# Patient Record
Sex: Male | Born: 1940 | Race: White | Hispanic: No | Marital: Married | Smoking: Former smoker
Health system: Southern US, Community
[De-identification: ages and names within clinical notes are randomized; demographics above are authoritative.]

## PROBLEM LIST (undated history)

## (undated) DIAGNOSIS — I10 Essential (primary) hypertension: Secondary | ICD-10-CM

## (undated) DIAGNOSIS — I4892 Unspecified atrial flutter: Secondary | ICD-10-CM

## (undated) DIAGNOSIS — Z9289 Personal history of other medical treatment: Secondary | ICD-10-CM

## (undated) DIAGNOSIS — I2699 Other pulmonary embolism without acute cor pulmonale: Secondary | ICD-10-CM

## (undated) DIAGNOSIS — R9430 Abnormal result of cardiovascular function study, unspecified: Secondary | ICD-10-CM

## (undated) DIAGNOSIS — M109 Gout, unspecified: Secondary | ICD-10-CM

## (undated) DIAGNOSIS — K259 Gastric ulcer, unspecified as acute or chronic, without hemorrhage or perforation: Secondary | ICD-10-CM

## (undated) DIAGNOSIS — I82409 Acute embolism and thrombosis of unspecified deep veins of unspecified lower extremity: Secondary | ICD-10-CM

## (undated) DIAGNOSIS — E785 Hyperlipidemia, unspecified: Secondary | ICD-10-CM

## (undated) DIAGNOSIS — R002 Palpitations: Secondary | ICD-10-CM

## (undated) DIAGNOSIS — D649 Anemia, unspecified: Secondary | ICD-10-CM

## (undated) DIAGNOSIS — M1990 Unspecified osteoarthritis, unspecified site: Secondary | ICD-10-CM

## (undated) DIAGNOSIS — Z87448 Personal history of other diseases of urinary system: Secondary | ICD-10-CM

## (undated) DIAGNOSIS — J45909 Unspecified asthma, uncomplicated: Secondary | ICD-10-CM

## (undated) DIAGNOSIS — Z8739 Personal history of other diseases of the musculoskeletal system and connective tissue: Secondary | ICD-10-CM

## (undated) DIAGNOSIS — I509 Heart failure, unspecified: Secondary | ICD-10-CM

## (undated) DIAGNOSIS — I5032 Chronic diastolic (congestive) heart failure: Secondary | ICD-10-CM

## (undated) DIAGNOSIS — N289 Disorder of kidney and ureter, unspecified: Secondary | ICD-10-CM

## (undated) DIAGNOSIS — I5033 Acute on chronic diastolic (congestive) heart failure: Secondary | ICD-10-CM

## (undated) HISTORY — DX: Hyperlipidemia, unspecified: E78.5

## (undated) HISTORY — DX: Chronic diastolic (congestive) heart failure: I50.32

## (undated) HISTORY — DX: Unspecified asthma, uncomplicated: J45.909

## (undated) HISTORY — DX: Palpitations: R00.2

## (undated) HISTORY — DX: Acute embolism and thrombosis of unspecified deep veins of unspecified lower extremity: I82.409

## (undated) HISTORY — DX: Essential (primary) hypertension: I10

---

## 2001-09-28 DIAGNOSIS — I82409 Acute embolism and thrombosis of unspecified deep veins of unspecified lower extremity: Secondary | ICD-10-CM

## 2001-09-28 DIAGNOSIS — I2699 Other pulmonary embolism without acute cor pulmonale: Secondary | ICD-10-CM

## 2001-09-28 HISTORY — PX: VENA CAVA FILTER PLACEMENT: SUR1032

## 2001-09-28 HISTORY — DX: Other pulmonary embolism without acute cor pulmonale: I26.99

## 2001-09-28 HISTORY — DX: Acute embolism and thrombosis of unspecified deep veins of unspecified lower extremity: I82.409

## 2001-10-19 ENCOUNTER — Ambulatory Visit (HOSPITAL_COMMUNITY): Admission: RE | Admit: 2001-10-19 | Discharge: 2001-10-20 | Payer: Self-pay | Admitting: *Deleted

## 2001-10-19 ENCOUNTER — Encounter: Payer: Self-pay | Admitting: *Deleted

## 2003-07-12 ENCOUNTER — Encounter (INDEPENDENT_AMBULATORY_CARE_PROVIDER_SITE_OTHER): Payer: Self-pay | Admitting: Specialist

## 2003-07-12 ENCOUNTER — Ambulatory Visit (HOSPITAL_COMMUNITY): Admission: RE | Admit: 2003-07-12 | Discharge: 2003-07-12 | Payer: Self-pay | Admitting: Gastroenterology

## 2003-07-13 ENCOUNTER — Ambulatory Visit (HOSPITAL_COMMUNITY): Admission: RE | Admit: 2003-07-13 | Discharge: 2003-07-13 | Payer: Self-pay | Admitting: Family Medicine

## 2007-01-27 ENCOUNTER — Ambulatory Visit (HOSPITAL_COMMUNITY): Admission: RE | Admit: 2007-01-27 | Discharge: 2007-01-27 | Payer: Self-pay | Admitting: Family Medicine

## 2007-01-27 ENCOUNTER — Ambulatory Visit: Payer: Self-pay | Admitting: Vascular Surgery

## 2007-01-27 ENCOUNTER — Encounter: Payer: Self-pay | Admitting: Vascular Surgery

## 2008-04-23 ENCOUNTER — Encounter: Admission: RE | Admit: 2008-04-23 | Discharge: 2008-04-23 | Payer: Self-pay | Admitting: Family Medicine

## 2008-05-07 ENCOUNTER — Encounter: Admission: RE | Admit: 2008-05-07 | Discharge: 2008-05-07 | Payer: Self-pay | Admitting: Family Medicine

## 2010-06-06 ENCOUNTER — Ambulatory Visit: Payer: Self-pay | Admitting: Cardiology

## 2010-06-07 ENCOUNTER — Encounter: Payer: Self-pay | Admitting: Cardiology

## 2010-08-07 ENCOUNTER — Encounter: Admission: RE | Admit: 2010-08-07 | Discharge: 2010-08-07 | Payer: Self-pay | Admitting: Family Medicine

## 2010-09-04 ENCOUNTER — Inpatient Hospital Stay (HOSPITAL_COMMUNITY): Admission: EM | Admit: 2010-09-04 | Discharge: 2010-06-08 | Payer: Self-pay | Admitting: Emergency Medicine

## 2010-10-09 ENCOUNTER — Ambulatory Visit
Admission: RE | Admit: 2010-10-09 | Discharge: 2010-10-09 | Payer: Self-pay | Source: Home / Self Care | Attending: Internal Medicine | Admitting: Internal Medicine

## 2010-10-09 DIAGNOSIS — Z86718 Personal history of other venous thrombosis and embolism: Secondary | ICD-10-CM | POA: Insufficient documentation

## 2010-10-09 DIAGNOSIS — I4891 Unspecified atrial fibrillation: Secondary | ICD-10-CM | POA: Insufficient documentation

## 2010-10-09 DIAGNOSIS — J189 Pneumonia, unspecified organism: Secondary | ICD-10-CM | POA: Insufficient documentation

## 2010-10-09 DIAGNOSIS — J45909 Unspecified asthma, uncomplicated: Secondary | ICD-10-CM | POA: Insufficient documentation

## 2010-10-09 DIAGNOSIS — R002 Palpitations: Secondary | ICD-10-CM | POA: Insufficient documentation

## 2010-10-09 DIAGNOSIS — E785 Hyperlipidemia, unspecified: Secondary | ICD-10-CM | POA: Insufficient documentation

## 2010-10-09 DIAGNOSIS — I10 Essential (primary) hypertension: Secondary | ICD-10-CM | POA: Insufficient documentation

## 2010-10-20 ENCOUNTER — Encounter: Payer: Self-pay | Admitting: Family Medicine

## 2010-10-30 NOTE — Assessment & Plan Note (Signed)
Summary: np6/dx:a-fib/lg   Visit Type:   Follow-up   History of Present Illness: Bryan Hubbard is referred today by Dr. Eldridge Dace for evaluation and treatment of atrial fib.and flutter. He was diagnosed with atrial fib in September 2011.  At that time he presented with CHF and had  preserved LV function by echo.  He has done well since then.  He notes palpitations occaisionally but has not had any CHF symptoms. His blood pressure tends to run on the low side. He has not had syncope. He exercises regularly. He has had ECG's demonstrating both atrial fib and flutter.  Current Medications (verified): 1)  Simvastatin 20 Mg Tabs (Simvastatin) .... Take One Tablet By Mouth Daily At Bedtime 2)  Furosemide 40 Mg Tabs (Furosemide) .... Take One Tablet By Mouth Daily. 3)  Zetia 10 Mg Tabs (Ezetimibe) .... Take One Tablet By Mouth Daily. 4)  Potassium Chloride Crys Cr 20 Meq Cr-Tabs (Potassium Chloride Crys Cr) .... Take One Tablet By Mouth Daily 5)  Quinapril Hcl 40 Mg Tabs (Quinapril Hcl) .... Once Daily 6)  Diclofenac Sodium 75 Mg Tbec (Diclofenac Sodium) .... As Needed 7)  Warfarin Sodium 2 Mg Tabs (Warfarin Sodium) .... Use As Directed By Anticoagualtion Clinic 8)  Singulair 10 Mg Tabs (Montelukast Sodium) .... At Bedtime 9)  Avodart 0.5 Mg Caps (Dutasteride) .... Once Daily 10)  Fluocinolone Acetonide 0.025 % Crea (Fluocinolone Acetonide) .... Uad As Needed 11)  Lumigan 0.03 % Soln (Bimatoprost) .... Uad 12)  (Albuterol Sulfate) .... Uad 13)  Niacin Cr 500 Mg Cr-Caps (Niacin) .... 2 Tabs Daily 14)  Vitamin D 700 .... Once Daily 15)  Zinc 50 Mg Tabs (Zinc) .... Once Daily 16)  Tylenol 8 Hour 650 Mg Cr-Tabs (Acetaminophen) .... Uad 17)  Calcium 600 600 Mg Tabs (Calcium Carbonate) .... Once Daily 18)  Vitamin C 500 Mg  Tabs (Ascorbic Acid) .... Once Daily 19)  Multivitamins   Tabs (Multiple Vitamin) .... Once Daily 20)  Metoprolol Tartrate 25 Mg Tabs (Metoprolol Tartrate) .... Take One Tablet By  Mouth Twice A Day 21)  Amlodipine Besylate 5 Mg Tabs (Amlodipine Besylate) .... Take 1tablet By Mouth Daily 22)  Tamsulosin Hcl 0.4 Mg Caps (Tamsulosin Hcl) .... Qhs  Allergies (verified): No Known Drug Allergies  Past History:  Past Medical History: Last updated: 10/09/2010 Current Problems:  HYPERLIPIDEMIA (ICD-272.4) DVT (ICD-453.40) ALLERGIC ASTHMA (ICD-493.00) PALPITATIONS (ICD-785.1) HYPERTENSION, BENIGN (ICD-401.1) ATRIAL FIBRILLATION (ICD-427.31)    Family History: Last updated: 10/09/2010 No premature CAD No h/o atrial fib.  Social History: Last updated: 10/09/2010 Retired from [employer]) Married  Tobacco Use - Former.  Alcohol Use - yes Regular Exercise - yes  Family History: No premature CAD No h/o atrial fib.  Social History: Retired from [employer]) Married  Tobacco Use - Former.  Alcohol Use - yes Regular Exercise - yes  Review of Systems       All systems reviewed and negative except as noted in the HPI.  Vital Signs:  Patient profile:   70 year old male Height:      70 inches Weight:      196 pounds BMI:     28.22 Pulse rate:   64 / minute BP sitting:   110 / 68  (left arm)  Vitals Entered By: Laurance Flatten CMA (October 09, 2010 11:34 AM)  Physical Exam  General:  Well developed, well nourished, in no acute distress.  HEENT: normal Neck: supple. No JVD. Carotids 2+ bilaterally no bruits Cor:  IRIRR no rubs, gallops or murmur Lungs: CTA Ab: soft, nontender. nondistended. No HSM. Good bowel sounds Ext: warm. no cyanosis, clubbing or edema Neuro: alert and oriented. Grossly nonfocal. affect pleasant    EKG  Procedure date:  09/05/2010  Findings:      Atrial flutter with a ventricular response rate of:  80  Impression & Recommendations:  Problem # 1:  ATRIAL FIBRILLATION (ICD-427.31) The patient has both atrial fib and flutter. I have discussed the treatment options. First I think he will need lifelong coumadin  (or Pradaxa). With regard to rate vs rhythm control, we discussed the pro's and con's of both forms of treatment. As he has done well with rate control, I would recommend that this strategy be continued. He is only on low dose metoprolol. He might do better on long acting metoprolol. He may also eventually need to be considered for digoxin. I would not recommend either flutter or fib or both be ablated as his symptoms are minimal.  He will need to have his ventricular rate watched and I would recommend periodic holter monitoring to guide rate control. His updated medication list for this problem includes:    Warfarin Sodium 2 Mg Tabs (Warfarin sodium) ..... Use as directed by anticoagualtion clinic    Metoprolol Tartrate 25 Mg Tabs (Metoprolol tartrate) .Marland Kitchen... Take one tablet by mouth twice a day  Problem # 2:  HYPERTENSION, BENIGN (ICD-401.1) His blood pressure has been running low and I have asked him to stop his amlodipine. The following medications were removed from the medication list:    Amlodipine Besylate 5 Mg Tabs (Amlodipine besylate) .Marland Kitchen... Take 1tablet by mouth daily His updated medication list for this problem includes:    Furosemide 40 Mg Tabs (Furosemide) .Marland Kitchen... Take one tablet by mouth daily.    Quinapril Hcl 40 Mg Tabs (Quinapril hcl) ..... Once daily    Metoprolol Tartrate 25 Mg Tabs (Metoprolol tartrate) .Marland Kitchen... Take one tablet by mouth twice a day  Problem # 3:  CHRONIC DIASTOLIC HEART FAILURE (ICD-428.32) His symptoms are currently class 1. He will continue his current meds. A low sodium diet and rate control of his atrial fib are recommended. The following medications were removed from the medication list:    Amlodipine Besylate 5 Mg Tabs (Amlodipine besylate) .Marland Kitchen... Take 1tablet by mouth daily His updated medication list for this problem includes:    Furosemide 40 Mg Tabs (Furosemide) .Marland Kitchen... Take one tablet by mouth daily.    Quinapril Hcl 40 Mg Tabs (Quinapril hcl) ..... Once  daily    Warfarin Sodium 2 Mg Tabs (Warfarin sodium) ..... Use as directed by anticoagualtion clinic    Metoprolol Tartrate 25 Mg Tabs (Metoprolol tartrate) .Marland Kitchen... Take one tablet by mouth twice a day  Patient Instructions: 1)  Your physician recommends that you schedule a follow-up appointment as needed . 2)  Your physician has recommended you make the following change in your medication: Stop Amlodipine.

## 2010-12-11 LAB — CBC
HCT: 34.2 % — ABNORMAL LOW (ref 39.0–52.0)
HCT: 36.5 % — ABNORMAL LOW (ref 39.0–52.0)
Hemoglobin: 11.3 g/dL — ABNORMAL LOW (ref 13.0–17.0)
Hemoglobin: 11.7 g/dL — ABNORMAL LOW (ref 13.0–17.0)
MCH: 31.1 pg (ref 26.0–34.0)
MCH: 31.7 pg (ref 26.0–34.0)
MCHC: 33.7 g/dL (ref 30.0–36.0)
MCHC: 34.2 g/dL (ref 30.0–36.0)
MCHC: 35.1 g/dL (ref 30.0–36.0)
MCV: 92.4 fL (ref 78.0–100.0)
Platelets: 297 10*3/uL (ref 150–400)
RBC: 3.79 MIL/uL — ABNORMAL LOW (ref 4.22–5.81)
RDW: 12.4 % (ref 11.5–15.5)
RDW: 12.7 % (ref 11.5–15.5)
WBC: 7.1 10*3/uL (ref 4.0–10.5)

## 2010-12-11 LAB — COMPREHENSIVE METABOLIC PANEL
ALT: 33 U/L (ref 0–53)
CO2: 27 mEq/L (ref 19–32)
Calcium: 8.7 mg/dL (ref 8.4–10.5)
Creatinine, Ser: 0.96 mg/dL (ref 0.4–1.5)
GFR calc Af Amer: >60 mL/min (ref >60)
GFR calc non Af Amer: >60 mL/min (ref >60)
Glucose, Bld: 119 mg/dL — ABNORMAL HIGH (ref 70–99)
Sodium: 132 mEq/L — ABNORMAL LOW (ref 135–145)
Total Protein: 6.9 g/dL (ref 6.0–8.3)

## 2010-12-11 LAB — CULTURE, BLOOD (ROUTINE X 2): Culture: NO GROWTH

## 2010-12-11 LAB — BASIC METABOLIC PANEL
BUN: 15 mg/dL (ref 6–23)
Calcium: 9.5 mg/dL (ref 8.4–10.5)
Chloride: 99 mEq/L (ref 96–112)
Creatinine, Ser: 0.85 mg/dL (ref 0.4–1.5)
GFR calc Af Amer: >60 mL/min (ref >60)
GFR calc Af Amer: >60 mL/min (ref >60)
GFR calc non Af Amer: >60 mL/min (ref >60)
GFR calc non Af Amer: >60 mL/min (ref >60)
Glucose, Bld: 110 mg/dL — ABNORMAL HIGH (ref 70–99)
Potassium: 3.6 mEq/L (ref 3.5–5.1)
Sodium: 131 mEq/L — ABNORMAL LOW (ref 135–145)

## 2010-12-11 LAB — TSH: TSH: 1.284 u[IU]/mL (ref 0.350–4.500)

## 2010-12-11 LAB — CARDIAC PANEL(CRET KIN+CKTOT+MB+TROPI)
CK, MB: 2 ng/mL (ref 0.3–4.0)
CK, MB: 2.3 ng/mL (ref 0.3–4.0)
Relative Index: 1.6 (ref 0.0–2.5)
Relative Index: 1.9 (ref 0.0–2.5)
Total CK: 113 U/L (ref 7–232)
Total CK: 125 U/L (ref 7–232)
Troponin I: 0.15 ng/mL — ABNORMAL HIGH (ref 0.00–0.06)
Troponin I: 0.28 ng/mL — ABNORMAL HIGH (ref 0.00–0.06)
Troponin I: 0.47 ng/mL — ABNORMAL HIGH (ref 0.00–0.06)

## 2010-12-11 LAB — RETICULOCYTES: Retic Count, Absolute: 28.5 10*3/uL (ref 19.0–186.0)

## 2010-12-11 LAB — POCT CARDIAC MARKERS
CKMB, poc: 2.8 ng/mL (ref 1.0–8.0)
Myoglobin, poc: 129 ng/mL (ref 12–200)

## 2010-12-11 LAB — PROTIME-INR
INR: 1.69 — ABNORMAL HIGH (ref 0.00–1.49)
INR: 1.73 — ABNORMAL HIGH (ref 0.00–1.49)
Prothrombin Time: 20.1 seconds — ABNORMAL HIGH (ref 11.6–15.2)

## 2010-12-11 LAB — LIPID PANEL
Cholesterol: 115 mg/dL (ref 0–200)
LDL Cholesterol: 56 mg/dL (ref 0–99)

## 2010-12-11 LAB — IRON AND TIBC
Iron: 20 ug/dL — ABNORMAL LOW (ref 42–135)
Saturation Ratios: 8 % — ABNORMAL LOW (ref 20–55)
TIBC: 251 ug/dL (ref 215–435)
UIBC: 231 ug/dL

## 2010-12-11 LAB — BRAIN NATRIURETIC PEPTIDE: Pro B Natriuretic peptide (BNP): 723 pg/mL — ABNORMAL HIGH (ref 0.0–100.0)

## 2010-12-11 LAB — D-DIMER, QUANTITATIVE: D-Dimer, Quant: 1.03 ug/mL-FEU — ABNORMAL HIGH (ref 0.00–0.48)

## 2010-12-11 LAB — VITAMIN B12: Vitamin B-12: 176 pg/mL — ABNORMAL LOW (ref 211–911)

## 2010-12-11 LAB — MAGNESIUM: Magnesium: 2.3 mg/dL (ref 1.5–2.5)

## 2011-01-30 ENCOUNTER — Ambulatory Visit
Admission: RE | Admit: 2011-01-30 | Discharge: 2011-01-30 | Disposition: A | Discharge status: Home / Self Care | Payer: Medicare Other | Source: Ambulatory Visit | Attending: Family Medicine | Admitting: Family Medicine

## 2011-01-30 ENCOUNTER — Other Ambulatory Visit: Payer: Self-pay | Admitting: Family Medicine

## 2011-01-30 DIAGNOSIS — I4891 Unspecified atrial fibrillation: Secondary | ICD-10-CM

## 2011-02-13 NOTE — H&P (Signed)
[place]  Patient:    Bryan Hubbard, Bryan Hubbard Visit Number: [id number] MRN: [id number]          Service Type: DSU Location: 2000 2018 01 Attending Physician:  Melvenia Needles Dictated by:   Adair Patter, P.A. Admit Date:  10/19/2001 Discharge Date: 10/20/2001   CC:         CVTS office   History and Physical  CHIEF COMPLAINT:  Right femoral vein thrombus.  HISTORY OF PRESENT ILLNESS:  This is a 70 year old man who is referred to CVTS office secondary to free thrombus in his right femoral vein.  Because of this, Dr. Edilia Bo had the patient transferred emergently to [facility] in order to have an inferior vena cava filter placed.  The patient denied any nausea, vomiting, shortness of breath, or chest pain at the time of admission.  PAST MEDICAL HISTORY: 1. Hypertension. 2. Glaucoma.  PAST SURGICAL HISTORY:  The patient states he has periodontal surgery, but no other major surgery.  ALLERGIES:  No known medication allergies.  ADMITTING MEDICATIONS:  1. Pulmicort inhaler two puffs two times daily.  2. Albuterol inhaler two puffs three times daily.  3. Singulair 15 mg p.o. q.d.  4. Lipitor 10 mg p.o. q.d.  5. Aspirin 325 mg p.o. q.d.  6. Accupril 20 mg p.o. q.d.  7. Timolol 0.25% eyedrops, one drop each eye q.d.  8. Multivitamin one p.o. q.d.  9. Zinc 50 mg p.o. q.d. 10. Serevent inhaler two puffs b.i.d.  SOCIAL HISTORY:  The patient states he drinks two beers a day.  He denies any smoking.  FAMILY HISTORY:  Noncontributory.  REVIEW OF SYSTEMS:  GENERAL:  The patient denies any fever, chills, sweats, or frequent illnesses.  HEAD:  The patient denies any head injury or seizures. EYES:  The patient wears corrective lenses, has glaucoma.  EARS:  The patient denies any hearing loss, tinnitus, or vertigo, or ear infections.  NOSE:  The patient denies any epistaxis or sinusitis.  MOUTH:  The patient denies any problems with  dentition or frequent sore throats.  NECK:  The patient denies any lumps or masses in his neck or pain with range of motion.  CARDIOVASCULAR: The patient has a history of hypertension and gets occasional palpitations. He denies any MI, cardiac arrhythmias, or coronary artery disease.  PULMONARY: The patient has asthma, but denies any bronchitis or emphysema. GASTROINTESTINAL:  The patient denies any nausea, vomiting, diarrhea, constipation, gastroesophageal reflux disease, hematochezia, or melena. GENITOURINARY:  The patient denies any impotence, urgency, incontinence, or urinary tract infections.  MUSCULOSKELETAL:  The patient denies any arthritis, arthralgias, or myalgias.  NEUROLOGIC:  The patient denies any history of memory loss or depression, TIA, or CV.  PHYSICAL EXAMINATION:  VITAL SIGNS:  Blood pressure 147/87, pulse 58 and regular, respirations 16, temperature 98.9, weight 209 pounds.  GENERAL:  The patient is alert and oriented x3 in no distress.  HEENT:  Head is atraumatic, normocephalic.  Eyes:  Pupils are equal, round, and reactive to light and accommodation.  Extraocular muscles are intact without scleral icterus or nystagmus.  Nose:  Nares patent, intact, sinuses are nontender.  Mouth:  Moist without exudates.  NECK:  Supple without JVD, carotid bruits, lymphadenopathy, or thyromegaly.  HEART:  Reveals regular rate and rhythm without murmurs, gallops, or rubs.  LUNGS:  Bilaterally clear to auscultation.  ABDOMEN:  Soft, nontender, nondistended.  Positive bowel sounds in all four quadrants.  EXTREMITIES:  Reveal no cyanosis, clubbing, or  edema.  NEUROLOGIC:  Cranial nerves II-XII are grossly intact.  No focal neurologic deficits were noted.  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  PLAN: 1. The patient will be admitted for [place] filter placement. 2. The patient will be acclimated to Coumadin and Lovenox. Dictated by:   Adair Patter,  P.A. Attending Physician:  Melvenia Needles DD:  10/19/01 TD:  10/19/01 Job: [id number]

## 2011-02-13 NOTE — Op Note (Signed)
   NAMEMAURICIO, Bryan Hubbard                          ACCOUNT NO.:  [id number]   MEDICAL RECORD NO.:  [id number]                   PATIENT TYPE:  AMB   LOCATION:  ENDO                                 FACILITY:  [facility]   PHYSICIAN:  Graylin Shiver, M.D.                DATE OF BIRTH:  09-Jan-1941   DATE OF PROCEDURE:  07/12/2003  DATE OF DISCHARGE:                                 OPERATIVE REPORT   PROCEDURE:  Colonoscopy with polypectomy and biopsy.   INDICATIONS FOR PROCEDURE:  Screening.   Informed consent was obtained after explanation of the risks of bleeding,  infection and perforation.   PREMEDICATION:  Fentanyl 75 mcg IV, Versed 8 mg IV.   DESCRIPTION OF PROCEDURE:  After informed consent was obtained and conscious  sedation achieved, a rectal exam was performed and no masses were felt. The  Olympus colonoscope was inserted into the rectum and advanced around the  colon to the cecum. Cecal landmarks were identified. The cecum and ascending  colon were normal. The transverse colon was normal. The proximal descending  colon revealed a small 3 mm polyp which was removed with cold forceps. In  the mid sigmoid, there was a 6 mm sessile polyp which was snared and removed  by snare cautery technique. The cautery site looked good, the polyp was  retrieved. The rectum looked normal. He tolerated the procedure well without  complications.   IMPRESSION:  Two colon polyps.   PLAN:  The pathology will be checked.                                               Graylin Shiver, M.D.    SFG/MEDQ  D:  07/12/2003  T:  07/12/2003  Job:  [id number]   cc:   Dellis Anes. Idell Pickles, M.D.  [address]  [place]  [place] [address]  Fax: [phone]

## 2011-02-13 NOTE — Procedures (Signed)
[place]  Patient:    Bryan Hubbard, Bryan Hubbard Visit Number: [id number] MRN: [id number]          Service Type: DSU Location: 2000 2018 01 Attending Physician:  Melvenia Needles Dictated by:   Denman George, M.D. Proc. Date: 10/19/01 Admit Date:  10/19/2001   CC:         Dellis Anes. Idell Pickles, M.D.  Peripheral Vascular Catheterization Lab   Procedure Report  DIAGNOSIS: Free floating thrombus, right common femoral vein.  PROCEDURES: 1. Inferior venacavagram. 2. Placement of inferior vena cava trapeze, inferior vena cava filter.  ACCESS: Left common femoral vein 6 French sheath.  CONTRAST: Visipaque 70 mL.  COMPLICATIONS: None apparent.  CLINICAL NOTE: This is a 70 year old male who presented to Dr. Toya Smothers office with a history of right leg swelling for the past 2-3 weeks. He was referred to CVTS for a Doppler study, which revealed evidence of a free-floating thrombus in the right common femoral vein. He was seen and evaluated by Dr. Edilia Bo, and referred to the hospital for placement of an inferior vena cava filter.  DESCRIPTION OF PROCEDURE: He was brought to the Peripheral Vascular Catheterization Laboratory in stable condition and placed in the supine position. Informed consent obtained.  Both groins were prepped and draped in a sterile fashion. He was administered 2 mg of Nubain, 2 mg of Versed intravenously.  Left groin skin and subcutaneous tissue instilled with 1% Xylocaine.  A needle was easily introduced to the left common femoral vein. A  0.35 J wire passed through the needle, and under fluoroscopy passed into the inferior vena cava. A long 6 French sheath and dilator were advanced over the guide wire. The guide wire was removed and the sheath connected to the power injector.  An inferior venacavagram was obtained with two injections of 30 cc of contrast. These outlined the inferior vena cava, and position of the left and  right renal vein. The left renal vein was noted to arise at the level of the L2, L3 junction. The right renal vein was proximal to this.  The dilator was then removed from the sheath. The trapeze filter was then advanced into the sheath and advanced to the L2, L3 position. It was deployed and fluoroscopy revealed excellent position. The sheath was then removed.  There were no apparent complications. The patient was transferred to the holding are in stable condition.  FINAL IMPRESSION: 1. Free-floating thrombus, right common femoral vein. 2. Successful deployment of inferior vena cava trapeze inferior vena cava    filter. Dictated by:   Denman George, M.D. Attending Physician:  Melvenia Needles DD:  10/19/01 TD:  10/20/01 Job: [id number]

## 2011-03-10 ENCOUNTER — Other Ambulatory Visit: Payer: Self-pay | Admitting: Internal Medicine

## 2011-03-10 ENCOUNTER — Encounter (HOSPITAL_BASED_OUTPATIENT_CLINIC_OR_DEPARTMENT_OTHER): Payer: Medicare Other | Admitting: Internal Medicine

## 2011-03-10 DIAGNOSIS — E538 Deficiency of other specified B group vitamins: Secondary | ICD-10-CM

## 2011-03-10 DIAGNOSIS — Z7901 Long term (current) use of anticoagulants: Secondary | ICD-10-CM

## 2011-03-10 DIAGNOSIS — D509 Iron deficiency anemia, unspecified: Secondary | ICD-10-CM

## 2011-03-10 DIAGNOSIS — D649 Anemia, unspecified: Secondary | ICD-10-CM

## 2011-03-10 LAB — CBC & DIFF AND RETIC
BASO%: 0.3 % (ref 0.0–2.0)
HCT: 34.8 % — ABNORMAL LOW (ref 38.4–49.9)
Immature Retic Fract: 11.3 % (ref 0.00–13.40)
LYMPH%: 16.9 % (ref 14.0–49.0)
MCHC: 31.6 g/dL — ABNORMAL LOW (ref 32.0–36.0)
MCV: 86.8 fL (ref 79.3–98.0)
MONO#: 0.9 10*3/uL (ref 0.1–0.9)
MONO%: 8.8 % (ref 0.0–14.0)
NEUT%: 70.5 % (ref 39.0–75.0)
Platelets: 369 10*3/uL (ref 140–400)
RBC: 4.01 10*6/uL — ABNORMAL LOW (ref 4.20–5.82)
nRBC: 0 % (ref 0–0)

## 2011-03-12 LAB — IRON AND TIBC
%SAT: 10 % — ABNORMAL LOW (ref 20–55)
Iron: 29 ug/dL — ABNORMAL LOW (ref 42–165)
UIBC: 259 ug/dL

## 2011-03-12 LAB — COMPREHENSIVE METABOLIC PANEL
ALT: 29 U/L (ref 0–53)
Alkaline Phosphatase: 100 U/L (ref 39–117)
CO2: 23 mEq/L (ref 19–32)
Creatinine, Ser: 0.73 mg/dL (ref 0.50–1.35)
Sodium: 138 mEq/L (ref 135–145)
Total Bilirubin: 0.5 mg/dL (ref 0.3–1.2)

## 2011-03-12 LAB — PROTEIN ELECTROPHORESIS, SERUM
Alpha-2-Globulin: 15.4 % — ABNORMAL HIGH (ref 7.1–11.8)
Beta Globulin: 7 % (ref 4.7–7.2)
Gamma Globulin: 15.5 % (ref 11.1–18.8)

## 2011-03-12 LAB — FERRITIN: Ferritin: 326 ng/mL — ABNORMAL HIGH (ref 22–322)

## 2011-03-12 LAB — LACTATE DEHYDROGENASE: LDH: 145 U/L (ref 94–250)

## 2011-03-30 ENCOUNTER — Other Ambulatory Visit: Payer: Self-pay | Admitting: Internal Medicine

## 2011-03-30 ENCOUNTER — Encounter (HOSPITAL_BASED_OUTPATIENT_CLINIC_OR_DEPARTMENT_OTHER): Payer: Medicare Other | Admitting: Internal Medicine

## 2011-03-30 DIAGNOSIS — D509 Iron deficiency anemia, unspecified: Secondary | ICD-10-CM

## 2011-03-30 DIAGNOSIS — D539 Nutritional anemia, unspecified: Secondary | ICD-10-CM

## 2011-03-30 LAB — CBC WITH DIFFERENTIAL/PLATELET
Eosinophils Absolute: 0.5 10*3/uL (ref 0.0–0.5)
HCT: 34.7 % — ABNORMAL LOW (ref 38.4–49.9)
LYMPH%: 15.4 % (ref 14.0–49.0)
MCHC: 32.8 g/dL (ref 32.0–36.0)
MCV: 86.2 fL (ref 79.3–98.0)
MONO#: 0.9 10*3/uL (ref 0.1–0.9)
MONO%: 8.7 % (ref 0.0–14.0)
NEUT#: 7.8 10*3/uL — ABNORMAL HIGH (ref 1.5–6.5)
NEUT%: 70.8 % (ref 39.0–75.0)
Platelets: 397 10*3/uL (ref 140–400)
RBC: 4.03 10*6/uL — ABNORMAL LOW (ref 4.20–5.82)

## 2011-06-25 ENCOUNTER — Encounter (HOSPITAL_BASED_OUTPATIENT_CLINIC_OR_DEPARTMENT_OTHER): Payer: Medicare Other | Admitting: Internal Medicine

## 2011-06-25 ENCOUNTER — Other Ambulatory Visit: Payer: Self-pay | Admitting: Internal Medicine

## 2011-06-25 DIAGNOSIS — E538 Deficiency of other specified B group vitamins: Secondary | ICD-10-CM

## 2011-06-25 DIAGNOSIS — Z7901 Long term (current) use of anticoagulants: Secondary | ICD-10-CM

## 2011-06-25 DIAGNOSIS — D509 Iron deficiency anemia, unspecified: Secondary | ICD-10-CM

## 2011-06-25 LAB — IRON AND TIBC
%SAT: 14 % — ABNORMAL LOW (ref 20–55)
Iron: 41 ug/dL — ABNORMAL LOW (ref 42–165)

## 2011-06-25 LAB — CBC WITH DIFFERENTIAL/PLATELET
BASO%: 0.6 % (ref 0.0–2.0)
EOS%: 5 % (ref 0.0–7.0)
HCT: 36 % — ABNORMAL LOW (ref 38.4–49.9)
LYMPH%: 22.2 % (ref 14.0–49.0)
MCH: 28.5 pg (ref 27.2–33.4)
MCHC: 33.4 g/dL (ref 32.0–36.0)
MCV: 85.1 fL (ref 79.3–98.0)
MONO#: 0.8 10*3/uL (ref 0.1–0.9)
MONO%: 8.6 % (ref 0.0–14.0)
NEUT%: 63.6 % (ref 39.0–75.0)
Platelets: 324 10*3/uL (ref 140–400)
RBC: 4.23 10*6/uL (ref 4.20–5.82)
WBC: 8.8 10*3/uL (ref 4.0–10.3)

## 2011-07-01 ENCOUNTER — Encounter (HOSPITAL_BASED_OUTPATIENT_CLINIC_OR_DEPARTMENT_OTHER): Payer: Medicare Other | Admitting: Internal Medicine

## 2011-07-01 DIAGNOSIS — D509 Iron deficiency anemia, unspecified: Secondary | ICD-10-CM

## 2011-11-24 ENCOUNTER — Other Ambulatory Visit: Payer: Self-pay | Admitting: Internal Medicine

## 2011-11-26 ENCOUNTER — Telehealth: Payer: Self-pay | Admitting: Internal Medicine

## 2011-11-26 NOTE — Telephone Encounter (Signed)
Per 2/26 pof pt can't keep 4/4 f/u appt please r/s. Also per pof 4/2 lb ok. lmonvm for pt re new f/u appt for 4/8 but then spoke w/pt on cell + per pt he will be out of town 4/8. Pt given new f/u appt for 4/15 @ 9:45 am and 4/2 lb appt also confirmed.

## 2011-12-29 ENCOUNTER — Other Ambulatory Visit (HOSPITAL_BASED_OUTPATIENT_CLINIC_OR_DEPARTMENT_OTHER): Payer: Medicare Other | Admitting: Lab

## 2011-12-29 DIAGNOSIS — D509 Iron deficiency anemia, unspecified: Secondary | ICD-10-CM

## 2011-12-29 LAB — CBC WITH DIFFERENTIAL/PLATELET
Basophils Absolute: 0 10*3/uL (ref 0.0–0.1)
EOS%: 3.3 % (ref 0.0–7.0)
Eosinophils Absolute: 0.3 10*3/uL (ref 0.0–0.5)
HCT: 38.5 % (ref 38.4–49.9)
HGB: 12.9 g/dL — ABNORMAL LOW (ref 13.0–17.1)
MCH: 31.8 pg (ref 27.2–33.4)
MCV: 94.9 fL (ref 79.3–98.0)
MONO%: 13.6 % (ref 0.0–14.0)
NEUT#: 7.1 10*3/uL — ABNORMAL HIGH (ref 1.5–6.5)
NEUT%: 68.3 % (ref 39.0–75.0)
Platelets: 272 10*3/uL (ref 140–400)
RDW: 14.2 % (ref 11.0–14.6)

## 2011-12-29 LAB — FERRITIN: Ferritin: 222 ng/mL (ref 22–322)

## 2011-12-31 ENCOUNTER — Ambulatory Visit: Payer: Medicare Other | Admitting: Internal Medicine

## 2012-01-11 ENCOUNTER — Ambulatory Visit (HOSPITAL_BASED_OUTPATIENT_CLINIC_OR_DEPARTMENT_OTHER): Payer: Medicare Other | Admitting: Internal Medicine

## 2012-01-11 VITALS — BP 130/84 | HR 96 | Temp 97.1°F | Ht 70.0 in | Wt 190.1 lb

## 2012-01-11 DIAGNOSIS — D509 Iron deficiency anemia, unspecified: Secondary | ICD-10-CM

## 2012-01-11 MED ORDER — INTEGRA PLUS PO CAPS: 1.0000 | ORAL_CAPSULE | Freq: Every morning | ORAL | Status: DC

## 2012-01-11 NOTE — Progress Notes (Signed)
[facility] Telephone:([phone]) [phone]   [phone]  OFFICE PROGRESS NOTE  Darrow Bussing, MD, MD [address] [place] [address]  DIAGNOSIS: Iron deficiency anemia.  CURRENT THERAPY: Integra plus 1 capsule by mouth daily.  INTERVAL HISTORY: Bryan Hubbard 71 y.o. male returns to the clinic today for six-month followup visit accompanied by his wife. The patient is doing fine with no specific complaints. He denied having any significant fatigue or dizzy spells. He has no chest pain or shortness of breath, no cough or hemoptysis. No significant weight loss or night sweats. He has repeat CBC and iron study performed recently and he is here today for evaluation and discussion of his lab results  ALLERGIES:   has no allergies on file.  MEDICATIONS:  Current Outpatient Prescriptions  Medication Sig Dispense Refill  . FeFum-FePoly-FA-B Cmp-C-Biot (INTEGRA PLUS) CAPS Take 1 capsule by mouth every morning.  90 capsule  1    REVIEW OF SYSTEMS:  A comprehensive review of systems was negative.   PHYSICAL EXAMINATION: General appearance: alert, cooperative and no distress Lymph nodes: Cervical, supraclavicular, and axillary nodes normal. Resp: clear to auscultation bilaterally Cardio: regular rate and rhythm, S1, S2 normal, no murmur, click, rub or gallop GI: soft, non-tender; bowel sounds normal; no masses,  no organomegaly Extremities: extremities normal, atraumatic, no cyanosis or edema  ECOG PERFORMANCE STATUS: 0 - Asymptomatic  Blood pressure 130/84, pulse 96, temperature 97.1 F (36.2 C), temperature source Oral, height 5\' 10"  (1.778 m), weight 190 lb 1.6 oz (86.229 kg).  LABORATORY DATA: Lab Results  Component Value Date   WBC 10.4* 12/29/2011   HGB 12.9* 12/29/2011   HCT 38.5 12/29/2011   MCV 94.9 12/29/2011   PLT 272 12/29/2011      Chemistry      Component Value Date/Time   NA 138 03/10/2011 0938   NA 138 03/10/2011 0938   NA 138 03/10/2011  0938   NA 138 03/10/2011 0938   NA 138 03/10/2011 0938   K 4.3 03/10/2011 0938   K 4.3 03/10/2011 0938   K 4.3 03/10/2011 0938   K 4.3 03/10/2011 0938   K 4.3 03/10/2011 0938   CL 105 03/10/2011 0938   CL 105 03/10/2011 0938   CL 105 03/10/2011 0938   CL 105 03/10/2011 0938   CL 105 03/10/2011 0938   CO2 23 03/10/2011 0938   CO2 23 03/10/2011 0938   CO2 23 03/10/2011 0938   CO2 23 03/10/2011 0938   CO2 23 03/10/2011 0938   BUN 18 03/10/2011 0938   BUN 18 03/10/2011 0938   BUN 18 03/10/2011 0938   BUN 18 03/10/2011 0938   BUN 18 03/10/2011 0938   CREATININE 0.73 03/10/2011 0938   CREATININE 0.73 03/10/2011 0938   CREATININE 0.73 03/10/2011 0938   CREATININE 0.73 03/10/2011 0938   CREATININE 0.73 03/10/2011 0938      Component Value Date/Time   CALCIUM 9.4 03/10/2011 0938   CALCIUM 9.4 03/10/2011 0938   CALCIUM 9.4 03/10/2011 0938   CALCIUM 9.4 03/10/2011 0938   CALCIUM 9.4 03/10/2011 0938   ALKPHOS 100 03/10/2011 0938   ALKPHOS 100 03/10/2011 0938   ALKPHOS 100 03/10/2011 0938   ALKPHOS 100 03/10/2011 0938   ALKPHOS 100 03/10/2011 0938   AST 25 03/10/2011 0938   AST 25 03/10/2011 0938   AST 25 03/10/2011 0938   AST 25 03/10/2011 0938   AST 25 03/10/2011 0938   ALT 29  03/10/2011 0938   ALT 29 03/10/2011 0938   ALT 29 03/10/2011 0938   ALT 29 03/10/2011 0938   ALT 29 03/10/2011 0938   BILITOT 0.5 03/10/2011 0938   BILITOT 0.5 03/10/2011 0938   BILITOT 0.5 03/10/2011 0938   BILITOT 0.5 03/10/2011 0938   BILITOT 0.5 03/10/2011 0938       RADIOGRAPHIC STUDIES: No results found.  ASSESSMENT: This is a very pleasant 71 years old white male with history of deficiency anemia currently on Integra plus 1 capsule by mouth daily. He is doing fine and tolerating his treatment fairly well. She also has improvement in his hemoglobin and hematocrit.  PLAN: I recommend for the patient to continue on the same treatment for now was followup visit with his primary care physician. I gave him a refill of Integra plus for  six-months, but he can get refill from his primary care physician in the future. I did not get addition followup appointment but I would be happy to see him in the future as needed.  All questions were answered. The patient knows to call the clinic with any problems, questions or concerns. We can certainly see the patient much sooner if necessary.

## 2012-08-29 ENCOUNTER — Other Ambulatory Visit: Payer: Self-pay | Admitting: Family Medicine

## 2012-08-29 DIAGNOSIS — Z136 Encounter for screening for cardiovascular disorders: Secondary | ICD-10-CM

## 2012-08-31 ENCOUNTER — Other Ambulatory Visit: Payer: Self-pay | Admitting: Internal Medicine

## 2012-09-06 ENCOUNTER — Ambulatory Visit: Payer: 59

## 2012-09-07 ENCOUNTER — Ambulatory Visit
Admission: RE | Admit: 2012-09-07 | Discharge: 2012-09-07 | Disposition: A | Discharge status: Home / Self Care | Payer: Medicare Other | Source: Ambulatory Visit | Attending: Family Medicine | Admitting: Family Medicine

## 2012-09-07 DIAGNOSIS — Z136 Encounter for screening for cardiovascular disorders: Secondary | ICD-10-CM

## 2012-11-08 ENCOUNTER — Encounter: Payer: Self-pay | Admitting: Internal Medicine

## 2013-08-30 ENCOUNTER — Encounter: Payer: Self-pay | Admitting: Interventional Cardiology

## 2013-08-30 ENCOUNTER — Encounter: Payer: Self-pay | Admitting: *Deleted

## 2013-08-31 ENCOUNTER — Encounter: Payer: Self-pay | Admitting: Interventional Cardiology

## 2013-08-31 ENCOUNTER — Ambulatory Visit (INDEPENDENT_AMBULATORY_CARE_PROVIDER_SITE_OTHER): Payer: Medicare Other | Admitting: Interventional Cardiology

## 2013-08-31 VITALS — BP 110/70 | HR 86 | Ht 70.0 in | Wt 195.8 lb

## 2013-08-31 DIAGNOSIS — R002 Palpitations: Secondary | ICD-10-CM

## 2013-08-31 DIAGNOSIS — I82409 Acute embolism and thrombosis of unspecified deep veins of unspecified lower extremity: Secondary | ICD-10-CM

## 2013-08-31 DIAGNOSIS — I4892 Unspecified atrial flutter: Secondary | ICD-10-CM | POA: Insufficient documentation

## 2013-08-31 DIAGNOSIS — I5032 Chronic diastolic (congestive) heart failure: Secondary | ICD-10-CM

## 2013-08-31 DIAGNOSIS — I10 Essential (primary) hypertension: Secondary | ICD-10-CM

## 2013-08-31 DIAGNOSIS — E785 Hyperlipidemia, unspecified: Secondary | ICD-10-CM

## 2013-08-31 NOTE — Progress Notes (Signed)
Patient ID: Bryan Hubbard, male   DOB: 1941-01-24, 72 y.o.   MRN: [id number]    [address] [place], [place]  [address] Phone: [phone] Fax:  [phone]  Date:  08/31/2013   ID:  Bryan Hubbard, DOB December 12, 1940, MRN [id number]  PCP:  Darrow Bussing, MD      History of Present Illness: Bryan Hubbard is a 72 y.o. male who has had AFib and DVT. HE had shortlived palpitations but these have resolved. Overall Improved. No syncope or palpitations. Atrial Fibrillation F/U:  Yard work, weights, floor exercise; daily walking. c/o Dizziness while getting up from sitting position.  Denies : Chest pain.  Leg edema.  Orthopnea.  Palpitations.  Shortness of breath.  Syncope.     Wt Readings from Last 3 Encounters:  08/31/13 195 lb 12.8 oz (88.814 kg)  01/11/12 190 lb 1.6 oz (86.229 kg)  10/09/10 196 lb (88.905 kg)     Past Medical History  Diagnosis Date  . Atrial fibrillation   . Chronic diastolic heart failure   . DVT   . HYPERLIPIDEMIA   . HYPERTENSION, BENIGN   . ALLERGIC ASTHMA   . Palpitations     Current Outpatient Prescriptions  Medication Sig Dispense Refill  . acetaminophen (TYLENOL) 650 MG CR tablet Take 650 mg by mouth every 8 (eight) hours as needed.      Marland Kitchen albuterol (PROVENTIL HFA;VENTOLIN HFA) 108 (90 BASE) MCG/ACT inhaler Inhale 2 puffs into the lungs every 6 (six) hours as needed.      . bimatoprost (LUMIGAN) 0.03 % ophthalmic solution 1 drop at bedtime. Taking .10 %      . cyanocobalamin (,VITAMIN B-12,) 1000 MCG/ML injection Inject 1,000 mcg into the muscle once.      . diclofenac (VOLTAREN) 75 MG EC tablet Take 75 mg by mouth daily.       Marland Kitchen dutasteride (AVODART) 0.5 MG capsule Take 0.5 mg by mouth daily.      Marland Kitchen FeFum-FePoly-FA-B Cmp-C-Biot (INTEGRA PLUS) CAPS Take 1 capsule by mouth every morning.  90 capsule  1  . furosemide (LASIX) 20 MG tablet Take 20 mg by mouth daily.      . metoprolol tartrate (LOPRESSOR) 25 MG tablet Take 25 mg  by mouth 2 (two) times daily.      . montelukast (SINGULAIR) 10 MG tablet Take 10 mg by mouth at bedtime.       . Multiple Vitamin (MULTIVITAMIN) tablet Take 1 tablet by mouth daily.      . niacin (SLO-NIACIN) 500 MG tablet Take 500 mg by mouth 2 (two) times daily with a meal.      . omeprazole (PRILOSEC) 20 MG capsule Take 20 mg by mouth daily.       . potassium chloride (K-DUR) 10 MEQ tablet Take 10 mEq by mouth 2 (two) times daily.      . quinapril (ACCUPRIL) 20 MG tablet Take 20 mg by mouth at bedtime.      . traMADol (ULTRAM) 50 MG tablet Take 50 mg by mouth every 6 (six) hours as needed.       . vitamin C (ASCORBIC ACID) 500 MG tablet Take 500 mg by mouth daily.      Marland Kitchen VYTORIN 10-20 MG per tablet Take 1 tablet by mouth at bedtime.       Marland Kitchen warfarin (COUMADIN) 4 MG tablet Take 4 mg by mouth daily. 4 mg , m-f; 4.5 mg sat/sun  No current facility-administered medications for this visit.    Allergies:   Not on File  Social History:  The patient     Family History:  The patient's family history includes Heart attack in his maternal grandmother; Stroke in his mother.   ROS:  Please see the history of present illness.  No nausea, vomiting.  No fevers, chills.  No focal weakness.  No dysuria.    All other systems reviewed and negative.   PHYSICAL EXAM: VS:  BP 110/70  Pulse 86  Ht 5\' 10"  (1.778 m)  Wt 195 lb 12.8 oz (88.814 kg)  BMI 28.09 kg/m2 Well nourished, well developed, in no acute distress HEENT: normal Neck: no JVD, no carotid bruits Cardiac:  normal S1, S2; irregularly irregular Lungs:  clear to auscultation bilaterally, no wheezing, rhonchi or rales Abd: soft, nontender, no hepatomegaly Ext: no edema Skin: warm and dry Neuro:   no focal abnormalities noted  EKG:  Prior ECG 6 months ago showed rate-controlled atrial flutter    ASSESSMENT AND PLAN:  Atrial fibrillation  Continue Metoprolol Tartrate Tablet, 25 MG, 1 tablet, Orally, Twice a day  IMAGING: EKG     [name],[name] 02/27/2013 11:47:52 AM > VARANASI,[name] 02/27/2013 12:23:17 PM > Atrial flutter, rate controlled   Notes: Flutter. Rate controlled. Stroke prevention with Coumadin. Has seen Dr. Ladona Ridgel in the past regarding ablation. Not needed at this time. we discussed changing anticoagulation due to the inconvenience of Coumadin. We will check with his insurance company as to whether Eliquis or Xarelto would be affordable.    2. Chronic diastolic heart failure  Decrease Furosemide Tablet, 20MG , 1 tablet, Orally, two-four days a week Notes: Decrease frequency of furosemide as tolerated. For the most part, he is taking this twice a week. he is not having any significantshortness of breath.    3. Essential hypertension, benign  Continue Quinapril HCl Tablet, 40 MG, 1/2 tablet, Orally, once daily Notes: Controlled at home.    4. Combined hyperlipidemia  Continue Vytorin Tablet, 10-20 MG, 1 tablet, Orally, Once a day  Notes: Lipids were controlled in May but he had cramps with Crestor. Back on Vytorin. Vit D level normal. CoEnzyme Q10 200 mg daily has helped.  Negative nuclear stress test in 2011.      Labs    Lab: CBC without Diff (Ordered for 03/10/2013)  WBC 5.4  4.0-11.0 - K/ul  RBC 4.14 L 4.20-5.80 - M/uL  HCT 39.4  39.0-52.0 - %  HGB 13.2  13.0-17.0 - g/dL  MCV 16.1 H 09.6-04.5 - fL  MCH 31.9  27.0-33.0 - pg  MCHC 33.4  32.0-36.0 - g/dL  RDW 40.9  81.1-91.4 - %  PLT 222  150-400 - K/uL   VARANASI,[name] 03/10/2013 05:31:32 PM > hbg stable [name],[name] 03/13/2013 09:04:05 AM > Pt notified.        Lab: Statin Panel (Ordered for 03/10/2013)  ALP 65  38-126 - U/L  ALT 29  0-52 - U/L  AST 31  0-39 - U/L  CHOLESTEROL 167  <200 - mg/dL  TRIG 782  9-562 - mg/dL  DLDL 130 H 8-65 - mg/dL  NON-HDL 784  6-962 - mg/dL  DIRECT HDL 47  95-28 - mg/dL  CHOL/HDL 3.6  4.1-3.2 - Ratio   VARANASI,[name] 03/10/2013 05:31:59 PM > lipids, liver stable [name],[name] 03/13/2013 09:04:35 AM > Pt notified.      Signed, Fredric Mare, MD, [facility] 08/31/2013 10:03 AM

## 2013-08-31 NOTE — Patient Instructions (Signed)
Your physician wants you to follow-up in: 6 months with Dr. Eldridge Dace. You will receive a reminder letter in the mail two months in advance. If you don't receive a letter, please call our office to schedule the follow-up appointment.  Your physician recommends that you return for lab work in 6 months FASTING on the same day as your OV.  Your physician recommends that you continue on your current medications as directed. Please refer to the Current Medication list given to you today.

## 2013-12-14 ENCOUNTER — Other Ambulatory Visit: Payer: Self-pay | Admitting: Family Medicine

## 2013-12-14 ENCOUNTER — Ambulatory Visit
Admission: RE | Admit: 2013-12-14 | Discharge: 2013-12-14 | Disposition: A | Discharge status: Home / Self Care | Payer: Medicare Other | Source: Ambulatory Visit | Attending: Family Medicine | Admitting: Family Medicine

## 2013-12-14 DIAGNOSIS — R0602 Shortness of breath: Secondary | ICD-10-CM

## 2013-12-18 ENCOUNTER — Inpatient Hospital Stay (HOSPITAL_COMMUNITY)
Admission: EM | Admit: 2013-12-18 | Discharge: 2013-12-30 | DRG: 545 | Disposition: A | Discharge status: Home / Self Care | Payer: Medicare Other | Attending: Internal Medicine | Admitting: Internal Medicine

## 2013-12-18 ENCOUNTER — Encounter (HOSPITAL_COMMUNITY): Payer: Self-pay | Admitting: Emergency Medicine

## 2013-12-18 ENCOUNTER — Inpatient Hospital Stay (HOSPITAL_COMMUNITY): Payer: Medicare Other

## 2013-12-18 DIAGNOSIS — Z8249 Family history of ischemic heart disease and other diseases of the circulatory system: Secondary | ICD-10-CM

## 2013-12-18 DIAGNOSIS — Z87891 Personal history of nicotine dependence: Secondary | ICD-10-CM

## 2013-12-18 DIAGNOSIS — D75839 Thrombocytosis, unspecified: Secondary | ICD-10-CM

## 2013-12-18 DIAGNOSIS — I776 Arteritis, unspecified: Secondary | ICD-10-CM

## 2013-12-18 DIAGNOSIS — I7782 Antineutrophilic cytoplasmic antibody (ANCA) vasculitis: Secondary | ICD-10-CM

## 2013-12-18 DIAGNOSIS — N400 Enlarged prostate without lower urinary tract symptoms: Secondary | ICD-10-CM | POA: Diagnosis present

## 2013-12-18 DIAGNOSIS — N059 Unspecified nephritic syndrome with unspecified morphologic changes: Secondary | ICD-10-CM

## 2013-12-18 DIAGNOSIS — E785 Hyperlipidemia, unspecified: Secondary | ICD-10-CM | POA: Diagnosis present

## 2013-12-18 DIAGNOSIS — I10 Essential (primary) hypertension: Secondary | ICD-10-CM | POA: Diagnosis present

## 2013-12-18 DIAGNOSIS — I4892 Unspecified atrial flutter: Secondary | ICD-10-CM | POA: Diagnosis present

## 2013-12-18 DIAGNOSIS — I509 Heart failure, unspecified: Secondary | ICD-10-CM | POA: Diagnosis present

## 2013-12-18 DIAGNOSIS — J45909 Unspecified asthma, uncomplicated: Secondary | ICD-10-CM | POA: Diagnosis present

## 2013-12-18 DIAGNOSIS — M310 Hypersensitivity angiitis: Principal | ICD-10-CM | POA: Diagnosis present

## 2013-12-18 DIAGNOSIS — Z823 Family history of stroke: Secondary | ICD-10-CM

## 2013-12-18 DIAGNOSIS — E871 Hypo-osmolality and hyponatremia: Secondary | ICD-10-CM | POA: Diagnosis not present

## 2013-12-18 DIAGNOSIS — Z86718 Personal history of other venous thrombosis and embolism: Secondary | ICD-10-CM | POA: Diagnosis present

## 2013-12-18 DIAGNOSIS — D638 Anemia in other chronic diseases classified elsewhere: Secondary | ICD-10-CM

## 2013-12-18 DIAGNOSIS — D72829 Elevated white blood cell count, unspecified: Secondary | ICD-10-CM | POA: Diagnosis present

## 2013-12-18 DIAGNOSIS — N058 Unspecified nephritic syndrome with other morphologic changes: Secondary | ICD-10-CM | POA: Diagnosis present

## 2013-12-18 DIAGNOSIS — J189 Pneumonia, unspecified organism: Secondary | ICD-10-CM | POA: Diagnosis present

## 2013-12-18 DIAGNOSIS — J9600 Acute respiratory failure, unspecified whether with hypoxia or hypercapnia: Secondary | ICD-10-CM | POA: Diagnosis present

## 2013-12-18 DIAGNOSIS — Z7901 Long term (current) use of anticoagulants: Secondary | ICD-10-CM

## 2013-12-18 DIAGNOSIS — R002 Palpitations: Secondary | ICD-10-CM

## 2013-12-18 DIAGNOSIS — I4891 Unspecified atrial fibrillation: Secondary | ICD-10-CM | POA: Diagnosis present

## 2013-12-18 DIAGNOSIS — I5032 Chronic diastolic (congestive) heart failure: Secondary | ICD-10-CM | POA: Diagnosis present

## 2013-12-18 DIAGNOSIS — N179 Acute kidney failure, unspecified: Secondary | ICD-10-CM | POA: Diagnosis present

## 2013-12-18 DIAGNOSIS — I82409 Acute embolism and thrombosis of unspecified deep veins of unspecified lower extremity: Secondary | ICD-10-CM

## 2013-12-18 DIAGNOSIS — D473 Essential (hemorrhagic) thrombocythemia: Secondary | ICD-10-CM

## 2013-12-18 LAB — CBC
HCT: 27.3 % — ABNORMAL LOW (ref 39.0–52.0)
HEMOGLOBIN: 9.3 g/dL — AB (ref 13.0–17.0)
MCH: 30.2 pg (ref 26.0–34.0)
MCHC: 34.1 g/dL (ref 30.0–36.0)
MCV: 88.6 fL (ref 78.0–100.0)
PLATELETS: 456 10*3/uL — AB (ref 150–400)
RBC: 3.08 MIL/uL — AB (ref 4.22–5.81)
RDW: 13 % (ref 11.5–15.5)
WBC: 15.5 10*3/uL — ABNORMAL HIGH (ref 4.0–10.5)

## 2013-12-18 LAB — COMPREHENSIVE METABOLIC PANEL
ALBUMIN: 3 g/dL — AB (ref 3.5–5.2)
ALK PHOS: 104 U/L (ref 39–117)
ALT: 70 U/L — AB (ref 0–53)
AST: 63 U/L — ABNORMAL HIGH (ref 0–37)
BUN: 31 mg/dL — ABNORMAL HIGH (ref 6–23)
CALCIUM: 9.8 mg/dL (ref 8.4–10.5)
CO2: 20 mEq/L (ref 19–32)
Chloride: 95 mEq/L — ABNORMAL LOW (ref 96–112)
Creatinine, Ser: 1.53 mg/dL — ABNORMAL HIGH (ref 0.50–1.35)
GFR calc non Af Amer: 43 mL/min — ABNORMAL LOW (ref >90)
GFR, EST AFRICAN AMERICAN: 50 mL/min — AB (ref >90)
Glucose, Bld: 117 mg/dL — ABNORMAL HIGH (ref 70–99)
Potassium: 4.2 mEq/L (ref 3.7–5.3)
SODIUM: 132 meq/L — AB (ref 137–147)
Total Bilirubin: 1.2 mg/dL (ref 0.3–1.2)
Total Protein: 7.8 g/dL (ref 6.0–8.3)

## 2013-12-18 LAB — PROTIME-INR
INR: 2.02 — ABNORMAL HIGH (ref 0.00–1.49)
PROTHROMBIN TIME: 22.2 s — AB (ref 11.6–15.2)

## 2013-12-18 LAB — I-STAT TROPONIN, ED: TROPONIN I, POC: 0.01 ng/mL (ref 0.00–0.08)

## 2013-12-18 LAB — PRO B NATRIURETIC PEPTIDE: PRO B NATRI PEPTIDE: 5296 pg/mL — AB (ref 0–125)

## 2013-12-18 MED ORDER — LEVOFLOXACIN 750 MG PO TABS: 750.0000 mg | ORAL_TABLET | Freq: Every day | ORAL | Status: DC

## 2013-12-18 MED ORDER — DEXTROSE 5 % IV SOLN
500.0000 mg | INTRAVENOUS | Status: DC
  Administered 2013-12-18 – 2013-12-20 (×3): 500 mg via INTRAVENOUS
  Filled 2013-12-18 (×4): qty 500

## 2013-12-18 MED ORDER — MAGNESIUM OXIDE 400 (241.3 MG) MG PO TABS
200.0000 mg | ORAL_TABLET | Freq: Every day | ORAL | Status: DC
  Administered 2013-12-18 – 2013-12-29 (×12): 200 mg via ORAL
  Filled 2013-12-18 (×14): qty 0.5

## 2013-12-18 MED ORDER — WARFARIN - PHARMACIST DOSING INPATIENT: Freq: Every day | Status: DC

## 2013-12-18 MED ORDER — TRAMADOL HCL 50 MG PO TABS
50.0000 mg | ORAL_TABLET | Freq: Two times a day (BID) | ORAL | Status: DC
  Administered 2013-12-19 – 2013-12-30 (×22): 50 mg via ORAL
  Filled 2013-12-18 (×22): qty 1

## 2013-12-18 MED ORDER — WARFARIN SODIUM 2 MG PO TABS
2.0000 mg | ORAL_TABLET | Freq: Once | ORAL | Status: AC
  Administered 2013-12-18: 2 mg via ORAL
  Filled 2013-12-18 (×2): qty 1

## 2013-12-18 MED ORDER — SODIUM CHLORIDE 0.9 % IV SOLN
INTRAVENOUS | Status: AC
  Administered 2013-12-18: 23:00:00 via INTRAVENOUS

## 2013-12-18 MED ORDER — ONDANSETRON HCL 4 MG/2ML IJ SOLN
4.0000 mg | Freq: Three times a day (TID) | INTRAMUSCULAR | Status: AC | PRN
  Administered 2013-12-19: 4 mg via INTRAVENOUS
  Filled 2013-12-18: qty 2

## 2013-12-18 MED ORDER — DICLOFENAC SODIUM 75 MG PO TBEC
75.0000 mg | DELAYED_RELEASE_TABLET | Freq: Every day | ORAL | Status: DC
  Administered 2013-12-18 – 2013-12-19 (×2): 75 mg via ORAL
  Filled 2013-12-18 (×3): qty 1

## 2013-12-18 MED ORDER — MAGNESIUM 250 MG PO TABS: 1.0000 | ORAL_TABLET | Freq: Every evening | ORAL | Status: DC

## 2013-12-18 MED ORDER — DUTASTERIDE 0.5 MG PO CAPS
0.5000 mg | ORAL_CAPSULE | Freq: Every day | ORAL | Status: DC
  Administered 2013-12-18 – 2013-12-30 (×12): 0.5 mg via ORAL
  Filled 2013-12-18 (×15): qty 1

## 2013-12-18 MED ORDER — SODIUM CHLORIDE 0.9 % IJ SOLN
3.0000 mL | Freq: Two times a day (BID) | INTRAMUSCULAR | Status: DC
  Administered 2013-12-18 – 2013-12-30 (×10): 3 mL via INTRAVENOUS

## 2013-12-18 MED ORDER — LATANOPROST 0.005 % OP SOLN
1.0000 [drp] | Freq: Every day | OPHTHALMIC | Status: DC
Start: 2013-12-19 — End: 2013-12-25
  Administered 2013-12-19 – 2013-12-24 (×2): 1 [drp] via OPHTHALMIC
  Filled 2013-12-18: qty 2.5

## 2013-12-18 MED ORDER — PANTOPRAZOLE SODIUM 40 MG PO TBEC
40.0000 mg | DELAYED_RELEASE_TABLET | Freq: Every day | ORAL | Status: DC
  Administered 2013-12-19 – 2013-12-30 (×12): 40 mg via ORAL
  Filled 2013-12-18 (×11): qty 1

## 2013-12-18 MED ORDER — EZETIMIBE-SIMVASTATIN 10-20 MG PO TABS
1.0000 | ORAL_TABLET | Freq: Every day | ORAL | Status: DC
  Administered 2013-12-21 – 2013-12-30 (×10): 1 via ORAL
  Filled 2013-12-18 (×12): qty 1

## 2013-12-18 MED ORDER — CO-ENZYME Q10 200 MG PO CAPS: 200.0000 mg | ORAL_CAPSULE | Freq: Every day | ORAL | Status: DC

## 2013-12-18 MED ORDER — METOPROLOL TARTRATE 25 MG PO TABS
25.0000 mg | ORAL_TABLET | Freq: Two times a day (BID) | ORAL | Status: DC
  Administered 2013-12-18 – 2013-12-22 (×8): 25 mg via ORAL
  Filled 2013-12-18 (×9): qty 1

## 2013-12-18 MED ORDER — DEXTROSE 5 % IV SOLN
1.0000 g | INTRAVENOUS | Status: AC
  Administered 2013-12-19 – 2013-12-24 (×6): 1 g via INTRAVENOUS
  Filled 2013-12-18 (×6): qty 10

## 2013-12-18 MED ORDER — TAMSULOSIN HCL 0.4 MG PO CAPS
0.4000 mg | ORAL_CAPSULE | Freq: Every day | ORAL | Status: DC
Start: 2013-12-19 — End: 2013-12-30
  Administered 2013-12-19 – 2013-12-30 (×12): 0.4 mg via ORAL
  Filled 2013-12-18 (×12): qty 1

## 2013-12-18 MED ORDER — DUTASTERIDE-TAMSULOSIN HCL 0.5-0.4 MG PO CAPS: 1.0000 | ORAL_CAPSULE | Freq: Every day | ORAL | Status: DC

## 2013-12-18 MED ORDER — DEXTROSE 5 % IV SOLN
1.0000 g | Freq: Once | INTRAVENOUS | Status: AC
  Administered 2013-12-18: 1 g via INTRAVENOUS
  Filled 2013-12-18: qty 10

## 2013-12-18 MED ORDER — HEPARIN SODIUM (PORCINE) 5000 UNIT/ML IJ SOLN
5000.0000 [IU] | Freq: Three times a day (TID) | INTRAMUSCULAR | Status: DC
  Administered 2013-12-18 – 2013-12-20 (×5): 5000 [IU] via SUBCUTANEOUS
  Filled 2013-12-18 (×8): qty 1

## 2013-12-18 MED ORDER — MONTELUKAST SODIUM 10 MG PO TABS
10.0000 mg | ORAL_TABLET | Freq: Every day | ORAL | Status: DC
  Administered 2013-12-18 – 2013-12-29 (×12): 10 mg via ORAL
  Filled 2013-12-18 (×14): qty 1

## 2013-12-18 NOTE — ED Notes (Signed)
Pt being sent from Dr Dorthy [place] office with xray and notes. Pt seen by pcp last week for SOB x2 weeks, xray showed bil infiltrates, started on levoquin, pt came back to pcp today still SOB and some CHF, wbc 14

## 2013-12-18 NOTE — Progress Notes (Signed)
ANTICOAGULATION CONSULT NOTE - Initial Consult  Pharmacy Consult for Warfarin  Indication: Hx A.Fib, DVT  No Known Allergies  Patient Measurements:    Vital Signs: Temp: 98.1 F (36.7 C) (03/23 1424) Temp src: Oral (03/23 1424) BP: 122/74 mmHg (03/23 1424) Pulse Rate: 78 (03/23 1424)  Labs:  Recent Labs  12/18/13 1635  HGB 9.3*  HCT 27.3*  PLT 456*  LABPROT 22.2*  INR 2.02*  CREATININE 1.53*    The CrCl is unknown because both a height and weight (above a minimum accepted value) are required for this calculation.   Medical History: Past Medical History  Diagnosis Date  . Atrial fibrillation   . Chronic diastolic heart failure   . DVT   . HYPERLIPIDEMIA   . HYPERTENSION, BENIGN   . ALLERGIC ASTHMA   . Palpitations     Medications:  Scheduled:   Infusions:  . cefTRIAXone (ROCEPHIN)  IV     PRN:   Assessment:  73 yo M with CAP, Patient started on levaquin outpatient on 3/19 and was sent to ER today by PCP for continued SOB x 2 weeks and bilateral infiltrates on CXR.  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  Ordered per pharmacy to dose.   Home warfarin dose = 2 mg daily; Last dose reported 3/22  INR on admission is therapeutic, 2.02 (Goal 2-3)   CBC okay, no bleeding issues noted  Goal of Therapy:  INR 2-3 Monitor platelets by anticoagulation protocol: Yes   Plan:  1.) Continue Home dosing tonight - Warfarin 2 mg po x 1 at 2000  2.) Daily PT/INR 3.) Monitor CBC and s/sx bleeding.    BorgerdingGaye Alken PharmD Pager #: [phone] 6:39 PM 12/18/2013

## 2013-12-18 NOTE — H&P (Signed)
[facility] History and Physical  [name]:[id number] DOB: 16-Oct-1940 DOA: 12/18/2013  Referring physician: EDP PCP: [name], MD   Chief Complaint: SOB   HPI: Bryan Hubbard is a 73 y.o. male who presents with a 2 week history of SOB.  X ray on 3/19 showed B infiltrates.  Patient started on levaquin.  Despite that he was still SOB and so went back to his PCP today.  PCP sent patient in to ED.  Earlier this morning patient also took 80mg  lasix, (normally takes 20mg  2 times a week), accidentally took 80 this morning when he was supposed to take 40 per his PCP.  He has noted no peripheral edema.  He does have a h/o diastolic CHF.  His SOB is worse with activity, better when lying down, slight improvement with the lasix.  Review of Systems: Some cough, no fevers, no chills, cough non-productive, Systems reviewed.  As above, otherwise negative  Past Medical History  Diagnosis Date  . Atrial fibrillation   . Chronic diastolic heart failure   . DVT   . HYPERLIPIDEMIA   . HYPERTENSION, BENIGN   . ALLERGIC ASTHMA   . Palpitations    History reviewed. No pertinent past surgical history. Social History:  reports that he has quit smoking. He does not have any smokeless tobacco history on file. His alcohol and drug histories are not on file.  No Known Allergies  Family History  Problem Relation Age of Onset  . Stroke Mother   . Heart attack Maternal Grandmother      Prior to Admission medications   Medication Sig Start Date End Date Taking? Authorizing Provider  bimatoprost (LUMIGAN) 0.01 % SOLN Place 1 drop into both eyes every morning.   Yes Historical Provider, MD  Calcium Carb-Cholecalciferol (CALCIUM + D3 PO) Take 1 tablet by mouth daily.   Yes Historical Provider, MD  Co-Enzyme Q10 200 MG CAPS Take 200 mg by mouth every evening.   Yes Historical Provider, MD  cyanocobalamin (,VITAMIN B-12,) 1000 MCG/ML injection Inject 1,000 mcg into the muscle every 30  (thirty) days.    Yes Historical Provider, MD  diclofenac (VOLTAREN) 75 MG EC tablet Take 75 mg by mouth daily.  07/04/13  Yes Historical Provider, MD  Dutasteride-Tamsulosin HCl 0.5-0.4 MG CAPS Take 1 capsule by mouth daily.   Yes Historical Provider, MD  furosemide (LASIX) 20 MG tablet Take 20 mg by mouth 2 (two) times a week. On Tuesday and Thursday.   Yes Historical Provider, MD  levofloxacin (LEVAQUIN) 750 MG tablet Take 750 mg by mouth daily. For 10 days. 12/14/13  Yes Historical Provider, MD  Magnesium 250 MG TABS Take 1 tablet by mouth every evening.   Yes Historical Provider, MD  metoprolol tartrate (LOPRESSOR) 25 MG tablet Take 25 mg by mouth 2 (two) times daily. 06/15/13  Yes Historical Provider, MD  montelukast (SINGULAIR) 10 MG tablet Take 10 mg by mouth at bedtime.  06/10/13  Yes Historical Provider, MD  Multiple Vitamin (MULTIVITAMIN) tablet Take 1 tablet by mouth daily.   Yes Historical Provider, MD  omeprazole (PRILOSEC) 20 MG capsule Take 20 mg by mouth daily.  08/17/13  Yes Historical Provider, MD  potassium chloride SA (K-DUR,KLOR-CON) 20 MEQ tablet Take 20 mEq by mouth 2 (two) times a week. On Tuesday and Thursday.   Yes Historical Provider, MD  quinapril (ACCUPRIL) 20 MG tablet Take 20 mg by mouth daily.    Yes Historical Provider, MD  traMADol (ULTRAM) 50 MG  tablet Take 50 mg by mouth 2 (two) times daily at 10 am and 4 pm.  07/14/13  Yes Historical Provider, MD  vitamin C (ASCORBIC ACID) 500 MG tablet Take 500 mg by mouth daily.   Yes Historical Provider, MD  VYTORIN 10-20 MG per tablet Take 1 tablet by mouth at bedtime.  08/27/13  Yes Historical Provider, MD  warfarin (COUMADIN) 2 MG tablet Take 2 mg by mouth daily.   Yes Historical Provider, MD   Physical Exam: Filed Vitals:   12/18/13 1424  BP: 122/74  Pulse: 78  Temp: 98.1 F (36.7 C)    BP 122/74  Pulse 78  Temp(Src) 98.1 F (36.7 C) (Oral)  SpO2 93%  General Appearance:    Alert, oriented, no distress, appears  stated age  Head:    Normocephalic, atraumatic  Eyes:    PERRL, EOMI, sclera non-icteric        Nose:   Nares without drainage or epistaxis. Mucosa, turbinates normal  Throat:   Moist mucous membranes. Oropharynx without erythema or exudate.  Neck:   Supple. No carotid bruits.  No thyromegaly.  No lymphadenopathy.   Back:     No CVA tenderness, no spinal tenderness  Lungs:     Clear to auscultation bilaterally, without wheezes, rhonchi or rales  Chest wall:    No tenderness to palpitation  Heart:    Regular rate and rhythm without murmurs, gallops, rubs  Abdomen:     Soft, non-tender, nondistended, normal bowel sounds, no organomegaly  Genitalia:    deferred  Rectal:    deferred  Extremities:   No clubbing, cyanosis or edema.  Pulses:   2+ and symmetric all extremities  Skin:   Skin color, texture, turgor normal, no rashes or lesions  Lymph nodes:   Cervical, supraclavicular, and axillary nodes normal  Neurologic:   CNII-XII intact. Normal strength, sensation and reflexes      throughout    Labs on Admission:  Basic Metabolic Panel:  Recent Labs Lab 12/18/13 1635  NA 132*  K 4.2  CL 95*  CO2 20  GLUCOSE 117*  BUN 31*  CREATININE 1.53*  CALCIUM 9.8   Liver Function Tests:  Recent Labs Lab 12/18/13 1635  AST 63*  ALT 70*  ALKPHOS 104  BILITOT 1.2  PROT 7.8  ALBUMIN 3.0*   No results found for this basename: LIPASE, AMYLASE,  in the last 168 hours No results found for this basename: AMMONIA,  in the last 168 hours CBC:  Recent Labs Lab 12/18/13 1635  WBC 15.5*  HGB 9.3*  HCT 27.3*  MCV 88.6  PLT 456*   Cardiac Enzymes: No results found for this basename: CKTOTAL, CKMB, CKMBINDEX, TROPONINI,  in the last 168 hours  BNP (last 3 results)  Recent Labs  12/18/13 1635  PROBNP 5296.0*   CBG: No results found for this basename: GLUCAP,  in the last 168 hours  Radiological Exams on Admission: No results found.  EKG: Independently  reviewed.  Assessment/Plan Principal Problem:   CAP (community acquired pneumonia) Active Problems:   Leukocytosis   Community acquired pneumonia   AKI (acute kidney injury)   1. CAP - given leukocytosis, and lack of orthopnea (actually opposite of orthopnea) believe that symptoms more representative of CAP than CHF, failed outpatient levaquin, will switch to inpatient rocephin and azithromycin. 2. Leukocytosis - repeat CBC in AM. 3. AKI - due to increased lasix today most likely, monitor intake and output, gentle hydration, holding lasix and  ACEI, repeat BMP in AM.    Code Status: Full  Family Communication: Wife at bedside Disposition Plan: Admit to inpatient   Time spent: 78 min  GARDNER, JARED M. [facility] Pager [phone]  If 7AM-7PM, please contact the day team taking care of the patient [email or web address] Password [facility] 12/18/2013, 7:31 PM

## 2013-12-18 NOTE — ED Provider Notes (Signed)
CSN: [id number]     Arrival date & time 12/18/13  1416 History   First MD Initiated Contact with Patient 12/18/13 1606     Chief Complaint  Patient presents with  . Shortness of Breath  . low RBC       HPI Pt being sent from Dr Dorthy [place] office with xray and notes. Pt seen by pcp last week for SOB x2 weeks, xray showed bil infiltrates, started on levoquin, pt came back to pcp today still SOB and some CHF, wbc 14  Past Medical History  Diagnosis Date  . Atrial fibrillation   . Chronic diastolic heart failure   . DVT   . HYPERLIPIDEMIA   . HYPERTENSION, BENIGN   . ALLERGIC ASTHMA   . Palpitations    History reviewed. No pertinent past surgical history. Family History  Problem Relation Age of Onset  . Stroke Mother   . Heart attack Maternal Grandmother    History  Substance Use Topics  . Smoking status: Former Research scientist (life sciences)  . Smokeless tobacco: Not on file  . Alcohol Use: Not on file    Review of Systems  All other systems reviewed and are negative.      Allergies  Review of patient's allergies indicates no known allergies.  Home Medications   Current Outpatient Rx  Name  Route  Sig  Dispense  Refill  . bimatoprost (LUMIGAN) 0.01 % SOLN   Both Eyes   Place 1 drop into both eyes every morning.         . Calcium Carb-Cholecalciferol (CALCIUM + D3 PO)   Oral   Take 1 tablet by mouth daily.         Marland Kitchen Co-Enzyme Q10 200 MG CAPS   Oral   Take 200 mg by mouth every evening.         . cyanocobalamin (,VITAMIN B-12,) 1000 MCG/ML injection   Intramuscular   Inject 1,000 mcg into the muscle every 30 (thirty) days.          . diclofenac (VOLTAREN) 75 MG EC tablet   Oral   Take 75 mg by mouth daily.          . Dutasteride-Tamsulosin HCl 0.5-0.4 MG CAPS   Oral   Take 1 capsule by mouth daily.         . furosemide (LASIX) 20 MG tablet   Oral   Take 20 mg by mouth 2 (two) times a week. On Tuesday and Thursday.         Marland Kitchen levofloxacin (LEVAQUIN) 750 MG  tablet   Oral   Take 750 mg by mouth daily. For 10 days.         . Magnesium 250 MG TABS   Oral   Take 1 tablet by mouth every evening.         . metoprolol tartrate (LOPRESSOR) 25 MG tablet   Oral   Take 25 mg by mouth 2 (two) times daily.         . montelukast (SINGULAIR) 10 MG tablet   Oral   Take 10 mg by mouth at bedtime.          . Multiple Vitamin (MULTIVITAMIN) tablet   Oral   Take 1 tablet by mouth daily.         Marland Kitchen omeprazole (PRILOSEC) 20 MG capsule   Oral   Take 20 mg by mouth daily.          . potassium chloride SA (K-DUR,KLOR-CON) 20 MEQ tablet  Oral   Take 20 mEq by mouth 2 (two) times a week. On Tuesday and Thursday.         . quinapril (ACCUPRIL) 20 MG tablet   Oral   Take 20 mg by mouth daily.          . traMADol (ULTRAM) 50 MG tablet   Oral   Take 50 mg by mouth 2 (two) times daily at 10 am and 4 pm.          . vitamin C (ASCORBIC ACID) 500 MG tablet   Oral   Take 500 mg by mouth daily.         Marland Kitchen VYTORIN 10-20 MG per tablet   Oral   Take 1 tablet by mouth at bedtime.          Marland Kitchen warfarin (COUMADIN) 2 MG tablet   Oral   Take 2 mg by mouth daily.          BP 122/74  Pulse 78  Temp(Src) 98.1 F (36.7 C) (Oral)  SpO2 93% Physical Exam  Nursing note and vitals reviewed. Constitutional: He is oriented to person, place, and time. He appears well-developed and well-nourished. No distress.  HENT:  Head: Normocephalic and atraumatic.  Eyes: Pupils are equal, round, and reactive to light.  Neck: Normal range of motion.  Cardiovascular: Normal rate and intact distal pulses.   Pulmonary/Chest: No respiratory distress. He has no wheezes.  Abdominal: Normal appearance. He exhibits no distension.  Musculoskeletal: Normal range of motion.  Neurological: He is alert and oriented to person, place, and time. No cranial nerve deficit.  Skin: Skin is warm and dry. No rash noted.  Psychiatric: He has a normal mood and affect. His  behavior is normal.    ED Course  Procedures (including critical care time)  Medications  cefTRIAXone (ROCEPHIN) 1 g in dextrose 5 % 50 mL IVPB (not administered)    Labs Review Labs Reviewed  CBC - Abnormal; Notable for the following:    WBC 15.5 (*)    RBC 3.08 (*)    Hemoglobin 9.3 (*)    HCT 27.3 (*)    Platelets 456 (*)    All other components within normal limits  PRO B NATRIURETIC PEPTIDE - Abnormal; Notable for the following:    Pro B Natriuretic peptide (BNP) 5296.0 (*)    All other components within normal limits  COMPREHENSIVE METABOLIC PANEL - Abnormal; Notable for the following:    Sodium 132 (*)    Chloride 95 (*)    Glucose, Bld 117 (*)    BUN 31 (*)    Creatinine, Ser 1.53 (*)    Albumin 3.0 (*)    AST 63 (*)    ALT 70 (*)    GFR calc non Af Amer 43 (*)    GFR calc Af Amer 50 (*)    All other components within normal limits  PROTIME-INR - Abnormal; Notable for the following:    Prothrombin Time 22.2 (*)    INR 2.02 (*)    All other components within normal limits  I-STAT TROPOININ, ED   Imaging Review No results found. Chest x-ray done at primary care physician's office showed a lateral infiltrates consistent with pneumonia.  EKG Interpretation   Date/Time:  Monday December 18 2013 16:19:05 EDT Ventricular Rate:  86 PR Interval:  200 QRS Duration: 113 QT Interval:  399 QTC Calculation: 477 R Axis:   58 Text Interpretation:  Sinus rhythm Atrial premature complexes Consider  left atrial enlargement Probable RV involvement, suggest recording right  precordial leads Confirmed by Keundra Petrucelli  MD, Vestal Crandall ([id number]) on 12/18/2013  5:18:39 PM      MDM   Final diagnoses:  Community acquired pneumonia        Dot Lanes, MD 12/18/13 [phone]

## 2013-12-19 ENCOUNTER — Inpatient Hospital Stay (HOSPITAL_COMMUNITY): Payer: Medicare Other

## 2013-12-19 DIAGNOSIS — I4892 Unspecified atrial flutter: Secondary | ICD-10-CM

## 2013-12-19 DIAGNOSIS — I4891 Unspecified atrial fibrillation: Secondary | ICD-10-CM

## 2013-12-19 DIAGNOSIS — I10 Essential (primary) hypertension: Secondary | ICD-10-CM

## 2013-12-19 LAB — PROTIME-INR
INR: 2.09 — ABNORMAL HIGH (ref 0.00–1.49)
Prothrombin Time: 22.8 seconds — ABNORMAL HIGH (ref 11.6–15.2)

## 2013-12-19 LAB — BASIC METABOLIC PANEL
BUN: 35 mg/dL — ABNORMAL HIGH (ref 6–23)
CO2: 22 mEq/L (ref 19–32)
CREATININE: 1.66 mg/dL — AB (ref 0.50–1.35)
Calcium: 9.1 mg/dL (ref 8.4–10.5)
Chloride: 99 mEq/L (ref 96–112)
GFR calc Af Amer: 46 mL/min — ABNORMAL LOW (ref >90)
GFR calc non Af Amer: 39 mL/min — ABNORMAL LOW (ref >90)
GLUCOSE: 95 mg/dL (ref 70–99)
Potassium: 4 mEq/L (ref 3.7–5.3)
SODIUM: 135 meq/L — AB (ref 137–147)

## 2013-12-19 LAB — CBC
HCT: 26.5 % — ABNORMAL LOW (ref 39.0–52.0)
Hemoglobin: 9 g/dL — ABNORMAL LOW (ref 13.0–17.0)
MCH: 30.1 pg (ref 26.0–34.0)
MCHC: 34 g/dL (ref 30.0–36.0)
MCV: 88.6 fL (ref 78.0–100.0)
PLATELETS: 438 10*3/uL — AB (ref 150–400)
RBC: 2.99 MIL/uL — AB (ref 4.22–5.81)
RDW: 13 % (ref 11.5–15.5)
WBC: 11.8 10*3/uL — ABNORMAL HIGH (ref 4.0–10.5)

## 2013-12-19 MED ORDER — WARFARIN SODIUM 2 MG PO TABS
2.0000 mg | ORAL_TABLET | Freq: Once | ORAL | Status: AC
  Administered 2013-12-19: 2 mg via ORAL
  Filled 2013-12-19: qty 1

## 2013-12-19 MED ORDER — CO-ENZYME Q10 200 MG PO CAPS: 300.0000 mg | ORAL_CAPSULE | Freq: Every day | ORAL | Status: DC

## 2013-12-19 MED ORDER — CO-ENZYME Q10 100 MG PO CAPS
300.0000 mg | ORAL_CAPSULE | Freq: Every day | ORAL | Status: DC
  Administered 2013-12-19 – 2013-12-26 (×8): 300 mg via ORAL
  Filled 2013-12-19 (×2): qty 3

## 2013-12-19 NOTE — Progress Notes (Signed)
ANTICOAGULATION CONSULT NOTE - Follow up  Pharmacy Consult for Warfarin  Indication: Hx A.Fib, DVT  No Known Allergies  Patient Measurements: Height: 5' 10.5" (179.1 cm) Weight: 184 lb 1.6 oz (83.507 kg) IBW/kg (Calculated) : 74.15  Vital Signs: Temp: 98.2 F (36.8 C) (03/24 0842) Temp src: Oral (03/24 0842) BP: 122/82 mmHg (03/24 0842) Pulse Rate: 93 (03/24 0842)  Labs:  Recent Labs  12/18/13 1635 12/19/13 0348  HGB 9.3* 9.0*  HCT 27.3* 26.5*  PLT 456* 438*  LABPROT 22.2* 22.8*  INR 2.02* 2.09*  CREATININE 1.53* 1.66*    Estimated Creatinine Clearance: 41.6 ml/min (by C-G formula based on Cr of 1.66).  Assessment: 73yo M with CAP, started on Levaquin outpatient on 3/19 and was sent to ER today by PCP for continued SOB x 2 weeks and bilateral infiltrates on CXR.  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  Ordered per pharmacy to dose.   Home warfarin dose = 2 mg daily; Last dose reported 3/22  INR remains therapeutic.   CBC okay, no bleeding issues noted.  Tolerating diet.  Goal of Therapy:  INR 2-3 Monitor platelets by anticoagulation protocol: Yes   Plan:   Continue home dose with Coumadin 2mg  today.   Daily PT/INR  Monitor CBC and s/sx bleeding.   Romeo Rabon, PharmD, pager [phone]. 12/19/2013,8:47 AM.

## 2013-12-19 NOTE — Care Management Note (Addendum)
    Page 1 of 2   12/27/2013     3:43:19 PM   CARE MANAGEMENT NOTE 12/27/2013  Patient:  Bryan Hubbard, Bryan Hubbard   Account Number:  [id number]  Date Initiated:  12/19/2013  Documentation initiated by:  Dessa Phi  Subjective/Objective Assessment:   73 Y/O M ADMITTED W/PNA.HX:CHF.     Action/Plan:   FROM HOME.HAS PCP,PHARMACY.   Anticipated DC Date:  12/28/2013   Anticipated DC Plan:  [place]  CM consult      Choice offered to / List presented to:             Status of service:  In process, will continue to follow Medicare Important Message given?   (If response is "NO", the following Medicare IM given date fields will be blank) Date Medicare IM given:   Date Additional Medicare IM given:    Discharge Disposition:  ACUTE TO ACUTE TRANS  Per UR Regulation:  Reviewed for med. 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  12/26/13 Lambros Cerro RN,BSN NCM [phone] NOTED D/C SUMMARY, NO ANTICIPATED D/C NEEDS OR ORDERS.NURSE TO GO OVER LOVENOX INJECTIONS WITH SPOUSE.  12/25/13 Christne Platts RN,BSN NCM [phone] OFF 02.NEPHRO/IR FOLLOWING-RENAL BX IF INR QUALIFIES.H/H 7.7/24.1.NO ANTICIPATED D/C NEEDS.  12/22/13 Kajuana Shareef RN,BSN NCM [id number] NOTED 02 SATS QUALIFY FOR HOME 02,CAN ARRANGE IF NEEDED.  12/19/13 Tinzley Dalia RN,BSN NCM [phone] NO ANTICIPATED D/C NEEDS.

## 2013-12-19 NOTE — Progress Notes (Signed)
[facility] PROGRESS NOTE  Bryan Hubbard QIH:[id number] DOB: [birth year] DOA: 12/18/2013 PCP: [name], MD  Assessment/Plan: 1. CAP - failed outpatient levaquin therefore had switched abx regimen to rocephin and azithromycin. Currently on minimal O2 support. Afebrile. 2. Leukocytosis - trending down. 3. AKI - Cr 1.53->1.66. Cont NS at 100cc/hr 4. Aflutter - presently rate controlled. Cont beta blocker. On coumadin  Code Status: Full Family Communication: Pt in room (indicate person spoken with, relationship, and if by phone, the number) Disposition Plan: Pending  Consultants:  Oncology  Procedures:    Antibiotics:  Azithromycin 12/18/13>>>  Rocephin  12/18/13>>>  HPI/Subjective: No acute events noted overnight. No complaints.  Objective: Filed Vitals:   12/19/13 0442 12/19/13 0545 12/19/13 0552 12/19/13 0842  BP: 121/69   122/82  Pulse: 82   93  Temp: 98.2 F (36.8 C)   98.2 F (36.8 C)  TempSrc: Oral   Oral  Resp: 16   18  Height:      Weight:      SpO2: 95% 84% 95% 95%    Intake/Output Summary (Last 24 hours) at 12/19/13 0947 Last data filed at 12/19/13 8756  Gross per 24 hour  Intake    240 ml  Output   1475 ml  Net  -1235 ml   Filed Weights   12/18/13 1944  Weight: 83.507 kg (184 lb 1.6 oz)    Exam:   General:  Awake, in nad  Cardiovascular: regular s1, s2  Respiratory: normal resp effort, no wheezing  Abdomen: soft, nondstended  Musculoskeletal: perfused, no clubbing   Data Reviewed: Basic Metabolic Panel:  Recent Labs Lab 12/18/13 1635 12/19/13 0348  NA 132* 135*  K 4.2 4.0  CL 95* 99  CO2 20 22  GLUCOSE 117* 95  BUN 31* 35*  CREATININE 1.53* 1.66*  CALCIUM 9.8 9.1   Liver Function Tests:  Recent Labs Lab 12/18/13 1635  AST 63*  ALT 70*  ALKPHOS 104  BILITOT 1.2  PROT 7.8  ALBUMIN 3.0*   No results found for this basename: LIPASE, AMYLASE,  in the last 168 hours No results found for this basename:  AMMONIA,  in the last 168 hours CBC:  Recent Labs Lab 12/18/13 1635 12/19/13 0348  WBC 15.5* 11.8*  HGB 9.3* 9.0*  HCT 27.3* 26.5*  MCV 88.6 88.6  PLT 456* 438*   Cardiac Enzymes: No results found for this basename: CKTOTAL, CKMB, CKMBINDEX, TROPONINI,  in the last 168 hours BNP (last 3 results)  Recent Labs  12/18/13 1635  PROBNP 5296.0*   CBG: No results found for this basename: GLUCAP,  in the last 168 hours  No results found for this or any previous visit (from the past 240 hour(s)).   Studies: Dg Chest 2 View  12/18/2013   CLINICAL DATA:  Shortness of breath.  EXAM: CHEST  2 VIEW  COMPARISON:  DG CHEST 2 VIEW dated 12/14/2013; DG CHEST 2V dated 04/02/2012; CT ABD-PELV WO/W CM (HEMATURIA) dated 08/14/2010  FINDINGS: Bilateral patchy airspace opacities are mildly improved. Mild cardiomegaly. Thoracic spondylosis. No pleural effusion.  IMPRESSION: 1. Cardiomegaly with bilateral improved airspace opacities suggesting improving edema or improving atypical pneumonia.   Electronically Signed   By: Sherryl Barters M.D.   On: 12/18/2013 19:55   Dg Chest Port 1 View  12/19/2013   CLINICAL DATA:  Shortness of breath.  EXAM: PORTABLE CHEST - 1 VIEW  COMPARISON:  Chest x-ray 12/18/2013.  FINDINGS: Diffuse interstitial prominence and peribronchial cuffing, with patchy  ill-defined airspace opacities, similar to recent prior examinations but new compared to prior study from 04/22/2012. No pleural effusions. No evidence of pulmonary edema. Heart size appears borderline enlarged, likely accentuated by portable AP technique. Upper mediastinal contours are within normal limits.  IMPRESSION: 1. The appearance of the chest is again suggestive of bronchitis with multilobar bronchopneumonia.   Electronically Signed   By: Vinnie Langton M.D.   On: 12/19/2013 06:36    Scheduled Meds: . sodium chloride   Intravenous STAT  . azithromycin  500 mg Intravenous Q24H  . cefTRIAXone (ROCEPHIN)  IV  1 g  Intravenous Q24H  . Co-Enzyme Q10  200 mg Oral QHS  . diclofenac  75 mg Oral Daily  . dutasteride  0.5 mg Oral Daily   And  . tamsulosin  0.4 mg Oral Daily  . ezetimibe-simvastatin  1 tablet Oral QHS  . heparin  5,000 Units Subcutaneous 3 times per day  . latanoprost  1 drop Both Eyes QHS  . magnesium oxide  200 mg Oral QHS  . metoprolol tartrate  25 mg Oral BID  . montelukast  10 mg Oral QHS  . pantoprazole  40 mg Oral Daily  . sodium chloride  3 mL Intravenous Q12H  . traMADol  50 mg Oral BID  . warfarin  2 mg Oral ONCE-1800  . Warfarin - Pharmacist Dosing Inpatient   Does not apply q1800   Continuous Infusions:   Principal Problem:   CAP (community acquired pneumonia) Active Problems:   Leukocytosis   Community acquired pneumonia   AKI (acute kidney injury)  Time spent: 50min  [name], [facility] Pager [phone]. If 7PM-7AM, please contact night-coverage at [email or web address], password [facility] 12/19/2013, 9:47 AM  LOS: 1 day

## 2013-12-19 NOTE — Progress Notes (Signed)
Nutrition Brief Note  Patient identified on the Malnutrition Screening Tool (MST) Report  Wt Readings from Last 15 Encounters:  12/18/13 184 lb 1.6 oz (83.507 kg)  08/31/13 195 lb 12.8 oz (88.814 kg)  01/11/12 190 lb 1.6 oz (86.229 kg)  10/09/10 196 lb (88.905 kg)    Body mass index is 26.03 kg/(m^2). Patient meets criteria for Overweight based on current BMI.   Current diet order is Heart, patient is consuming approximately >75% of meals at this time. Labs and medications reviewed.   Pt reported decreased appetite for 5 days d/t nausea/abd pain, which he attributes to antibiotic. Has experienced a 10 lbs weight loss, which is likely d/t fluid fluctuations as pt noted he incorrectly dosed himself with lasix. Appetite is improving and eating well during admit. Family providing him outside food sources (pizza). Pt tolerating diet w/out nausea, has been selecting cold foods and fruits to assist with tolerance  No nutrition interventions warranted at this time. If nutrition issues arise, please consult RD.   Atlee Abide MS RD LDN Clinical Dietitian [phone]

## 2013-12-19 NOTE — Progress Notes (Signed)
Patient started to desat  Intermittently frequently at 4 am down to 84% at RA. Patient denies no increasing SOB at rest. O2 started at 2L/min via n/c, O2 sat increased and steady at 95-97%. On call NP made aware via page. Other vital signs remain stable. Will cont to monitor.

## 2013-12-20 ENCOUNTER — Inpatient Hospital Stay (HOSPITAL_COMMUNITY): Payer: Medicare Other

## 2013-12-20 LAB — BASIC METABOLIC PANEL
BUN: 37 mg/dL — AB (ref 6–23)
CHLORIDE: 98 meq/L (ref 96–112)
CO2: 21 meq/L (ref 19–32)
CREATININE: 1.57 mg/dL — AB (ref 0.50–1.35)
Calcium: 8.7 mg/dL (ref 8.4–10.5)
GFR calc Af Amer: 49 mL/min — ABNORMAL LOW (ref >90)
GFR calc non Af Amer: 42 mL/min — ABNORMAL LOW (ref >90)
Glucose, Bld: 104 mg/dL — ABNORMAL HIGH (ref 70–99)
POTASSIUM: 4.9 meq/L (ref 3.7–5.3)
Sodium: 131 mEq/L — ABNORMAL LOW (ref 137–147)

## 2013-12-20 LAB — URINALYSIS, ROUTINE W REFLEX MICROSCOPIC
Bilirubin Urine: NEGATIVE
GLUCOSE, UA: NEGATIVE mg/dL
KETONES UR: NEGATIVE mg/dL
LEUKOCYTES UA: NEGATIVE
Nitrite: NEGATIVE
Protein, ur: NEGATIVE mg/dL
SPECIFIC GRAVITY, URINE: 1.016 (ref 1.005–1.030)
Urobilinogen, UA: 1 mg/dL (ref 0.0–1.0)
pH: 6 (ref 5.0–8.0)

## 2013-12-20 LAB — CBC
HEMATOCRIT: 25.3 % — AB (ref 39.0–52.0)
Hemoglobin: 8.1 g/dL — ABNORMAL LOW (ref 13.0–17.0)
MCH: 28.8 pg (ref 26.0–34.0)
MCHC: 32 g/dL (ref 30.0–36.0)
MCV: 90 fL (ref 78.0–100.0)
Platelets: 430 10*3/uL — ABNORMAL HIGH (ref 150–400)
RBC: 2.81 MIL/uL — ABNORMAL LOW (ref 4.22–5.81)
RDW: 13.1 % (ref 11.5–15.5)
WBC: 13.5 10*3/uL — AB (ref 4.0–10.5)

## 2013-12-20 LAB — URINE MICROSCOPIC-ADD ON

## 2013-12-20 LAB — PROTIME-INR
INR: 1.94 — AB (ref 0.00–1.49)
Prothrombin Time: 21.6 seconds — ABNORMAL HIGH (ref 11.6–15.2)

## 2013-12-20 MED ORDER — ALBUTEROL SULFATE (2.5 MG/3ML) 0.083% IN NEBU: 2.5000 mg | INHALATION_SOLUTION | RESPIRATORY_TRACT | Status: DC | PRN

## 2013-12-20 MED ORDER — TRAMADOL HCL 50 MG PO TABS
50.0000 mg | ORAL_TABLET | ORAL | Status: AC
  Administered 2013-12-20: 50 mg via ORAL
  Filled 2013-12-20: qty 1

## 2013-12-20 MED ORDER — WARFARIN SODIUM 2 MG PO TABS
2.0000 mg | ORAL_TABLET | Freq: Once | ORAL | Status: AC
  Administered 2013-12-20: 2 mg via ORAL
  Filled 2013-12-20: qty 1

## 2013-12-20 MED ORDER — ALBUTEROL SULFATE (2.5 MG/3ML) 0.083% IN NEBU
2.5000 mg | INHALATION_SOLUTION | Freq: Once | RESPIRATORY_TRACT | Status: AC
  Administered 2013-12-20: 2.5 mg via RESPIRATORY_TRACT
  Filled 2013-12-20: qty 3

## 2013-12-20 MED ORDER — SODIUM CHLORIDE 0.9 % IV SOLN: INTRAVENOUS | Status: DC

## 2013-12-20 NOTE — Progress Notes (Signed)
ANTICOAGULATION CONSULT NOTE - Follow up  Pharmacy Consult for Warfarin  Indication: Hx A.Fib, DVT  No Known Allergies  Patient Measurements: Height: 5' 10.5" (179.1 cm) Weight: 189 lb 13.1 oz (86.1 kg) IBW/kg (Calculated) : 74.15  Vital Signs: Temp: 97.6 F (36.4 C) (03/25 0537) Temp src: Oral (03/25 0537) BP: 120/72 mmHg (03/25 1040) Pulse Rate: 80 (03/25 1040)  Labs:  Recent Labs  12/18/13 1635 12/19/13 0348 12/20/13 0333  HGB 9.3* 9.0* 8.1*  HCT 27.3* 26.5* 25.3*  PLT 456* 438* 430*  LABPROT 22.2* 22.8* 21.6*  INR 2.02* 2.09* 1.94*  CREATININE 1.53* 1.66* 1.57*    Estimated Creatinine Clearance: 44 ml/min (by C-G formula based on Cr of 1.57).  Assessment: 73yo M with CAP, started on Levaquin outpatient on 3/19 and was sent to ER today by PCP for continued SOB x 2 weeks and bilateral infiltrates on CXR.  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  Ordered per pharmacy to dose.   Home warfarin dose = 2 mg daily; Last dose reported 3/22  Inpatient warfarin doses 3/23-3/24: 2mg , 2mg   INR today slightly subtherapeutic at 1.94  CBC: Hgb trending down, no issues per RN, f/u closely  Tolerating diet.  Goal of Therapy:  INR 2-3 Monitor platelets by anticoagulation protocol: Yes   Plan:   Continue home dose Coumadin 2mg  today.   Daily PT/INR  Monitor CBC and s/sx bleeding.   Ralene Bathe, PharmD, BCPS 12/20/2013, 11:53 AM  Pager: [phone]

## 2013-12-20 NOTE — Progress Notes (Signed)
[facility] PROGRESS NOTE  Bryan Hubbard RXV:[id number] DOB: Apr 03, 1941 DOA: 12/18/2013 PCP: [name], MD  Assessment/Plan  Acute hypoxic respiratory failure secondary to CAP, CXR without pulmonary edema and INR therapeutic so PE much less likely.  No fevers so MRSA less likely.   - continue rocephin and azithromycin - wean oxygen as tolerated -  Trial of albuterol prn   Aflutter - presently rate controlled. Cont beta blocker. On coumadin -  Ok to d/c telemetry  Pleuritic right chest pain, likely related to pleuritis from pneumonia.  PE less likely as patient is therapeutic on warfarin.  Pain only occurrs with deep inspiration and movement.   -  Telemetry with chronic a-flutter, rate controlled -  No evidence of ischemic on ECG -  If this persists and after PNA resolves, consider ESR/CRP to eval for pericarditis.   -  Continue tramadol prn  AKI, creatinine trending down with IVF and BUN rising.  Making urine -  UA and FENA -  RUS -  Continue IVF -  D/c diclofenac   BPH, continue avodart/flomax -  F/u US  Leukocytosis, WBC rising -  Repeat in AM  Normocytic anemia, hgb drifting down -  Repeat CBC in AM  Diet:  Healthy heart Access:  PIV IVF:  yes Proph:  warfarin  Code Status: full Family Communication: patient alone Disposition Plan: pending improvement in dyspnea   Consultants:  none  Procedures:  CXR  Antibiotics:  Azithromycin 12/18/13>>>  Rocephin 12/18/13>>>   HPI/Subjective:  Feeling better, but still SOB with minimal exertion.  Denies fevers   Objective: Filed Vitals:   12/19/13 2125 12/20/13 0537 12/20/13 1040 12/20/13 1400  BP: 115/69 112/75 120/72 109/69  Pulse: 85 84 80 84  Temp: 98 F (36.7 C) 97.6 F (36.4 C)  98.5 F (36.9 C)  TempSrc: Oral Oral  Oral  Resp: $Remo'20 20  18  'SHarp$ Height:      Weight:  86.1 kg (189 lb 13.1 oz)    SpO2: 96% 94%  97%    Intake/Output Summary (Last 24 hours) at 12/20/13 1559 Last data filed at  12/20/13 1300  Gross per 24 hour  Intake    530 ml  Output   1425 ml  Net   -895 ml   Filed Weights   12/18/13 1944 12/20/13 0537  Weight: 83.507 kg (184 lb 1.6 oz) 86.1 kg (189 lb 13.1 oz)    Exam:   General:  CM, No acute distress  HEENT:  NCAT, MMM  Cardiovascular:  RRR, nl S1, S2 no mrg, 2+ pulses, warm extremities  Respiratory:  Fine rales throughout bilateral lung fields, no wheezes or rhonchi, no increased WOB  Abdomen:   NABS, soft, NT/ND  MSK:   Normal tone and bulk, no LEE  Neuro:  Grossly intact  Data Reviewed: Basic Metabolic Panel:  Recent Labs Lab 12/18/13 1635 12/19/13 0348 12/20/13 0333  NA 132* 135* 131*  K 4.2 4.0 4.9  CL 95* 99 98  CO2 $Re'20 22 21  'eIp$ GLUCOSE 117* 95 104*  BUN 31* 35* 37*  CREATININE 1.53* 1.66* 1.57*  CALCIUM 9.8 9.1 8.7   Liver Function Tests:  Recent Labs Lab 12/18/13 1635  AST 63*  ALT 70*  ALKPHOS 104  BILITOT 1.2  PROT 7.8  ALBUMIN 3.0*   No results found for this basename: LIPASE, AMYLASE,  in the last 168 hours No results found for this basename: AMMONIA,  in the last 168 hours CBC:  Recent Labs Lab  12/18/13 1635 12/19/13 0348 12/20/13 0333  WBC 15.5* 11.8* 13.5*  HGB 9.3* 9.0* 8.1*  HCT 27.3* 26.5* 25.3*  MCV 88.6 88.6 90.0  PLT 456* 438* 430*   Cardiac Enzymes: No results found for this basename: CKTOTAL, CKMB, CKMBINDEX, TROPONINI,  in the last 168 hours BNP (last 3 results)  Recent Labs  12/18/13 1635  PROBNP 5296.0*   CBG: No results found for this basename: GLUCAP,  in the last 168 hours  No results found for this or any previous visit (from the past 240 hour(s)).   Studies: Dg Chest 2 View  12/18/2013   CLINICAL DATA:  Shortness of breath.  EXAM: CHEST  2 VIEW  COMPARISON:  DG CHEST 2 VIEW dated 12/14/2013; DG CHEST 2V dated 04/02/2012; CT ABD-PELV WO/W CM (HEMATURIA) dated 08/14/2010  FINDINGS: Bilateral patchy airspace opacities are mildly improved. Mild cardiomegaly. Thoracic  spondylosis. No pleural effusion.  IMPRESSION: 1. Cardiomegaly with bilateral improved airspace opacities suggesting improving edema or improving atypical pneumonia.   Electronically Signed   By: Sherryl Barters M.D.   On: 12/18/2013 19:55   Dg Chest Port 1 View  12/19/2013   CLINICAL DATA:  Shortness of breath.  EXAM: PORTABLE CHEST - 1 VIEW  COMPARISON:  Chest x-ray 12/18/2013.  FINDINGS: Diffuse interstitial prominence and peribronchial cuffing, with patchy ill-defined airspace opacities, similar to recent prior examinations but new compared to prior study from 04/22/2012. No pleural effusions. No evidence of pulmonary edema. Heart size appears borderline enlarged, likely accentuated by portable AP technique. Upper mediastinal contours are within normal limits.  IMPRESSION: 1. The appearance of the chest is again suggestive of bronchitis with multilobar bronchopneumonia.   Electronically Signed   By: Vinnie Langton M.D.   On: 12/19/2013 06:36    Scheduled Meds: . azithromycin  500 mg Intravenous Q24H  . cefTRIAXone (ROCEPHIN)  IV  1 g Intravenous Q24H  . Co-Enzyme Q10  300 mg Oral QHS  . diclofenac  75 mg Oral Daily  . dutasteride  0.5 mg Oral Daily   And  . tamsulosin  0.4 mg Oral Daily  . ezetimibe-simvastatin  1 tablet Oral QHS  . latanoprost  1 drop Both Eyes QHS  . magnesium oxide  200 mg Oral QHS  . metoprolol tartrate  25 mg Oral BID  . montelukast  10 mg Oral QHS  . pantoprazole  40 mg Oral Daily  . sodium chloride  3 mL Intravenous Q12H  . traMADol  50 mg Oral BID  . warfarin  2 mg Oral ONCE-1800  . Warfarin - Pharmacist Dosing Inpatient   Does not apply q1800   Continuous Infusions:   Principal Problem:   CAP (community acquired pneumonia) Active Problems:   HYPERTENSION, BENIGN   Atrial fibrillation   Atrial flutter   Leukocytosis   Community acquired pneumonia   AKI (acute kidney injury)    Time spent: 30 min    Tosca Pletz, [facility] Pager  [phone]. If 7PM-7AM, please contact night-coverage at [email or web address], password [facility] 12/20/2013, 3:59 PM  LOS: 2 days

## 2013-12-20 NOTE — Progress Notes (Signed)
No changes in initial PM assessment at this time. Pt resting quietly in bed with eyes closed. No complaints at this time. Continue with plan of care 

## 2013-12-20 NOTE — Progress Notes (Signed)
Pt c/o chest pain with inspiration. Non radiating pain. EKG obtained and shows Aflutter with AV block No diaphoresis noted. Vss 120/78 - 22- 95% . Call placed to provider awaiting call back.

## 2013-12-21 ENCOUNTER — Encounter (HOSPITAL_COMMUNITY): Payer: Self-pay | Admitting: Internal Medicine

## 2013-12-21 LAB — BASIC METABOLIC PANEL
BUN: 34 mg/dL — AB (ref 6–23)
CALCIUM: 8.7 mg/dL (ref 8.4–10.5)
CHLORIDE: 98 meq/L (ref 96–112)
CO2: 21 mEq/L (ref 19–32)
Creatinine, Ser: 1.58 mg/dL — ABNORMAL HIGH (ref 0.50–1.35)
GFR calc non Af Amer: 42 mL/min — ABNORMAL LOW (ref >90)
GFR, EST AFRICAN AMERICAN: 48 mL/min — AB (ref >90)
Glucose, Bld: 95 mg/dL (ref 70–99)
Potassium: 4.5 mEq/L (ref 3.7–5.3)
Sodium: 130 mEq/L — ABNORMAL LOW (ref 137–147)

## 2013-12-21 LAB — IRON AND TIBC
Iron: 15 ug/dL — ABNORMAL LOW (ref 42–135)
Saturation Ratios: 7 % — ABNORMAL LOW (ref 20–55)
TIBC: 215 ug/dL (ref 215–435)
UIBC: 200 ug/dL (ref 125–400)

## 2013-12-21 LAB — CBC
HEMATOCRIT: 23.6 % — AB (ref 39.0–52.0)
Hemoglobin: 7.7 g/dL — ABNORMAL LOW (ref 13.0–17.0)
MCH: 29.2 pg (ref 26.0–34.0)
MCHC: 32.6 g/dL (ref 30.0–36.0)
MCV: 89.4 fL (ref 78.0–100.0)
PLATELETS: 438 10*3/uL — AB (ref 150–400)
RBC: 2.64 MIL/uL — ABNORMAL LOW (ref 4.22–5.81)
RDW: 13.2 % (ref 11.5–15.5)
WBC: 12.5 10*3/uL — ABNORMAL HIGH (ref 4.0–10.5)

## 2013-12-21 LAB — TYPE AND SCREEN
ABO/RH(D): A NEG
Antibody Screen: NEGATIVE

## 2013-12-21 LAB — TSH: TSH: 0.792 u[IU]/mL (ref 0.350–4.500)

## 2013-12-21 LAB — TRANSFERRIN: TRANSFERRIN: 175 mg/dL — AB (ref 200–360)

## 2013-12-21 LAB — PROTIME-INR
INR: 1.62 — AB (ref 0.00–1.49)
PROTHROMBIN TIME: 18.8 s — AB (ref 11.6–15.2)

## 2013-12-21 LAB — CREATININE, URINE, RANDOM: Creatinine, Urine: 91.4 mg/dL

## 2013-12-21 LAB — VITAMIN B12: Vitamin B-12: 532 pg/mL (ref 211–911)

## 2013-12-21 LAB — SODIUM, URINE, RANDOM: SODIUM UR: 68 meq/L

## 2013-12-21 LAB — OCCULT BLOOD X 1 CARD TO LAB, STOOL: Fecal Occult Bld: NEGATIVE

## 2013-12-21 LAB — FERRITIN: FERRITIN: 1602 ng/mL — AB (ref 22–322)

## 2013-12-21 LAB — ABO/RH: ABO/RH(D): A NEG

## 2013-12-21 LAB — SEDIMENTATION RATE: Sed Rate: 140 mm/hr — ABNORMAL HIGH (ref 0–16)

## 2013-12-21 MED ORDER — WARFARIN SODIUM 4 MG PO TABS
4.0000 mg | ORAL_TABLET | Freq: Once | ORAL | Status: AC
  Administered 2013-12-21: 4 mg via ORAL
  Filled 2013-12-21: qty 1

## 2013-12-21 NOTE — Progress Notes (Signed)
ANTICOAGULATION CONSULT NOTE - Follow up  Pharmacy Consult for Warfarin  Indication: Hx A.Fib, DVT  No Known Allergies  Patient Measurements: Height: 5' 10.5" (179.1 cm) Weight: 188 lb 14.4 oz (85.684 kg) IBW/kg (Calculated) : 74.15  Vital Signs: Temp: 98.6 F (37 C) (03/26 0500) Temp src: Oral (03/26 0500) BP: 118/70 mmHg (03/26 0500) Pulse Rate: 82 (03/26 0500)  Labs:  Recent Labs  12/19/13 0348 12/20/13 0333 12/21/13 0335  HGB 9.0* 8.1* 7.7*  HCT 26.5* 25.3* 23.6*  PLT 438* 430* 438*  LABPROT 22.8* 21.6* 18.8*  INR 2.09* 1.94* 1.62*  CREATININE 1.66* 1.57* 1.58*    Estimated Creatinine Clearance: 43.7 ml/min (by C-G formula based on Cr of 1.58).  Assessment: 73yo M with CAP, started on Levaquin outpatient on 3/19 and was sent to ER today by PCP for continued SOB x 2 weeks and bilateral infiltrates on CXR.  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  Ordered per pharmacy to dose.   Home warfarin dose = 2 mg daily; Last dose reported 3/22  Inpatient warfarin doses 3/23-3/25: 2mg , 2mg , 2mg   INR continues trending down today,now 1.62  CBC: Hgb trending down, no issues per RN, f/u closely  Tolerating diet.  Goal of Therapy:  INR 2-3 Monitor platelets by anticoagulation protocol: Yes   Plan:   Coumadin 4mg  today at 1400  Daily PT/INR  Monitor CBC and s/sx bleeding.   Ralene Bathe, PharmD, BCPS 12/21/2013, 12:40 PM  Pager: [phone]

## 2013-12-21 NOTE — Progress Notes (Signed)
Pt ambulated in hallway on RA with O2 stat ranging between 92-94%. Pt tolerated well.  After ambulating pt sitting up in bed and o2 stat was 87-89% on RA at rest pt denies SOB.  O2 reapplied at 1L and O2 at 93% at rest. Bryan Hubbard A

## 2013-12-21 NOTE — Progress Notes (Addendum)
[facility] PROGRESS NOTE  Bryan Hubbard OIT:[id number] DOB: [birth year] DOA: 12/18/2013 PCP: [name], MD  Assessment/Plan  Acute hypoxic respiratory failure with bilateral pulmonary infiltrates on CXR.  Discussed with primary care doctor today who stated his symtpoms have been present for more than a month.  Legionella/S. pneumo from clinic were both negative.  He has not improved much with antibiotics.   - continue rocephin day 4 - s/p 1.5 gm azithromycin - wean oxygen as tolerated -  ECHO to eval for possible concomitant heart failure after IVF last few days  AKI, received records from primary, baseline creatinine around 0.8 and had risen to 1.2 at last visit 3/19.  Making urine, but did not have much improvement despite IVF.  Elevated ferritin suggestive of inflammation and UA demonstrated RBCs.  DDx includes vasculitidies.  Discussed case with Dr. Mercy Moore. -  UA:  + 21-50 RBC, no WBC, no casts -  FENA 0.89 -  RUS:  wnl -  Continue IVF -  Continue to hold nephrotoxins such as diclofenac  -  Nephrology to consult in AM -  ESR, ANA, ANCA, anti-GBM ordered -  Mother died of unexplained renal failure  Normocytic anemia, hgb drifting down -  Iron studies consistent with inflammation, very elevated ferritin -  B12 wnl -  Folate pending -  Occult stool negative -  SPEP/UPEP/IFE pending   Aflutter - presently rate controlled. Cont beta blocker.  -  Ok to d/c telemetry -  Hold warfarin in case patient needs renal biopsy  Pleuritic right chest pain, likely related to pleuritis from pneumonia.  PE less likely as patient is therapeutic on warfarin.  Pain only occurrs with deep inspiration and movement.   -  Telemetry with chronic a-flutter, rate controlled -  No evidence of ischemic on ECG -  If this persists and after PNA resolves, consider ESR/CRP to eval for pericarditis.   -  Continue tramadol prn -  ECHO  BPH, continue avodart/flomax -  [place]:  No  obstruction  Leukocytosis, approximately stable -  Repeat in AM  Diet:  Healthy heart Access:  PIV IVF:  yes Proph:  warfarin  Code Status: full Family Communication: patient alone Disposition Plan:   Nephrology consultation/ECHO pending.     Consultants:  none  Procedures:  CXR  Antibiotics:  Azithromycin 12/18/13>>>  Rocephin 12/18/13 x 3 days, 1.5gm total   HPI/Subjective:  Feeling better, but still SOB with minimal exertion and O2 sats drop < 88% on RA at rest and also while asleep.    Objective: Filed Vitals:   12/20/13 2000 12/20/13 2134 12/21/13 0500 12/21/13 1416  BP:  113/66 118/70 116/65  Pulse:  86 82 81  Temp:  98.6 F (37 C) 98.6 F (37 C) 98 F (36.7 C)  TempSrc:  Oral Oral Oral  Resp:  _0 Height:      Weight:   85.684 kg (188 lb 14.4 oz)   SpO2: 95% 95% 96% 92%    Intake/Output Summary (Last 24 hours) at 12/21/13 1708 Last data filed at 12/21/13 1500  Gross per 24 hour  Intake   3010 ml  Output   1026 ml  Net   1984 ml   Filed Weights   12/18/13 1944 12/20/13 0537 12/21/13 0500  Weight: 83.507 kg (184 lb 1.6 oz) 86.1 kg (189 lb 13.1 oz) 85.684 kg (188 lb 14.4 oz)    Exam:   General:  CM, No acute distress  HEENT:  NCAT,  MMM  Cardiovascular:  RRR, nl S1, S2 no mrg, 2+ pulses, warm extremities  Respiratory:  Fine rales throughout bilateral lung fields, no wheezes or rhonchi, no increased WOB  Abdomen:   NABS, soft, NT/ND  MSK:   Normal tone and bulk, no LEE  Neuro:  Grossly intact  Data Reviewed: Basic Metabolic Panel:  Recent Labs Lab 12/18/13 1635 12/19/13 0348 12/20/13 0333 12/21/13 0335  NA 132* 135* 131* 130*  K 4.2 4.0 4.9 4.5  CL 95* 99 98 98  CO2 _0 GLUCOSE 117* 95 104* 95  BUN 31* 35* 37* 34*  CREATININE 1.53* 1.66* 1.57* 1.58*  CALCIUM 9.8 9.1 8.7 8.7   Liver Function Tests:  Recent Labs Lab 12/18/13 1635  AST 63*  ALT 70*  ALKPHOS 104  BILITOT 1.2  PROT 7.8  ALBUMIN 3.0*    No results found for this basename: LIPASE, AMYLASE,  in the last 168 hours No results found for this basename: AMMONIA,  in the last 168 hours CBC:  Recent Labs Lab 12/18/13 1635 12/19/13 0348 12/20/13 0333 12/21/13 0335  WBC 15.5* 11.8* 13.5* 12.5*  HGB 9.3* 9.0* 8.1* 7.7*  HCT 27.3* 26.5* 25.3* 23.6*  MCV 88.6 88.6 90.0 89.4  PLT 456* 438* 430* 438*   Cardiac Enzymes: No results found for this basename: CKTOTAL, CKMB, CKMBINDEX, TROPONINI,  in the last 168 hours BNP (last 3 results)  Recent Labs  12/18/13 1635  PROBNP 5296.0*   CBG: No results found for this basename: GLUCAP,  in the last 168 hours  No results found for this or any previous visit (from the past 240 hour(s)).   Studies: US Renal  12/20/2013   CLINICAL DATA:  Acute kidney injury.  EXAM: RENAL/URINARY TRACT ULTRASOUND COMPLETE  COMPARISON:  CT ABD-PELV WO/W CM (HEMATURIA) dated 08/14/2010  FINDINGS: Right Kidney:  Length: 13.0 cm. Echogenicity within normal limits. No mass or hydronephrosis visualized.  Left Kidney:  Length: 12.8 cm. Echogenicity within normal limits. No mass or hydronephrosis visualized.  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 Bladder is otherwise unremarkable in appearance.  IMPRESSION: 1. Unremarkable appearance of the kidneys.  No hydronephrosis. 2. Bladder diverticulum.   Electronically Signed   By: Logan Bores   On: 12/20/2013 18:39    Scheduled Meds: . azithromycin  500 mg Intravenous Q24H  . cefTRIAXone (ROCEPHIN)  IV  1 g Intravenous Q24H  . Co-Enzyme Q10  300 mg Oral QHS  . dutasteride  0.5 mg Oral Daily   And  . tamsulosin  0.4 mg Oral Daily  . ezetimibe-simvastatin  1 tablet Oral QHS  . latanoprost  1 drop Both Eyes QHS  . magnesium oxide  200 mg Oral QHS  . metoprolol tartrate  25 mg Oral BID  . montelukast  10 mg Oral QHS  . pantoprazole  40 mg Oral Daily  . sodium chloride  3 mL Intravenous Q12H  . traMADol  50 mg Oral BID  .  Warfarin - Pharmacist Dosing Inpatient   Does not apply q1800   Continuous Infusions: . sodium chloride      Principal Problem:   CAP (community acquired pneumonia) Active Problems:   HYPERTENSION, BENIGN   Atrial fibrillation   Atrial flutter   Leukocytosis   Community acquired pneumonia   AKI (acute kidney injury)    Time spent: 30 min    Jarmel Linhardt, [facility] Pager [phone]. If 7PM-7AM, please contact night-coverage  at [email or web address], password [facility] 12/21/2013, 5:08 PM  LOS: 3 days

## 2013-12-22 DIAGNOSIS — I059 Rheumatic mitral valve disease, unspecified: Secondary | ICD-10-CM

## 2013-12-22 LAB — BASIC METABOLIC PANEL
BUN: 32 mg/dL — AB (ref 6–23)
CO2: 21 meq/L (ref 19–32)
Calcium: 9 mg/dL (ref 8.4–10.5)
Chloride: 99 mEq/L (ref 96–112)
Creatinine, Ser: 1.56 mg/dL — ABNORMAL HIGH (ref 0.50–1.35)
GFR calc Af Amer: 49 mL/min — ABNORMAL LOW (ref >90)
GFR, EST NON AFRICAN AMERICAN: 42 mL/min — AB (ref >90)
GLUCOSE: 106 mg/dL — AB (ref 70–99)
Potassium: 4.6 mEq/L (ref 3.7–5.3)
SODIUM: 133 meq/L — AB (ref 137–147)

## 2013-12-22 LAB — CBC
HCT: 24.9 % — ABNORMAL LOW (ref 39.0–52.0)
HEMOGLOBIN: 8.3 g/dL — AB (ref 13.0–17.0)
MCH: 29.3 pg (ref 26.0–34.0)
MCHC: 33.3 g/dL (ref 30.0–36.0)
MCV: 88 fL (ref 78.0–100.0)
Platelets: 432 10*3/uL — ABNORMAL HIGH (ref 150–400)
RBC: 2.83 MIL/uL — AB (ref 4.22–5.81)
RDW: 13 % (ref 11.5–15.5)
WBC: 13 10*3/uL — ABNORMAL HIGH (ref 4.0–10.5)

## 2013-12-22 LAB — PROTIME-INR
INR: 1.51 — ABNORMAL HIGH (ref 0.00–1.49)
Prothrombin Time: 17.8 seconds — ABNORMAL HIGH (ref 11.6–15.2)

## 2013-12-22 LAB — ANA: Anti Nuclear Antibody(ANA): NEGATIVE

## 2013-12-22 LAB — GLOMERULAR BASEMENT MEMBRANE ANTIBODIES: GBM Ab: >8 — ABNORMAL HIGH

## 2013-12-22 LAB — FOLATE RBC: RBC Folate: 1380 ng/mL — ABNORMAL HIGH (ref >280)

## 2013-12-22 LAB — MPO/PR-3 (ANCA) ANTIBODIES

## 2013-12-22 MED ORDER — SODIUM CHLORIDE 0.9 % IV SOLN
500.0000 mg | INTRAVENOUS | Status: AC
  Administered 2013-12-22 – 2013-12-24 (×3): 500 mg via INTRAVENOUS
  Filled 2013-12-22 (×3): qty 4

## 2013-12-22 MED ORDER — HEPARIN (PORCINE) IN NACL 100-0.45 UNIT/ML-% IJ SOLN
1450.0000 [IU]/h | INTRAMUSCULAR | Status: DC
  Administered 2013-12-22: 1200 [IU]/h via INTRAVENOUS
  Administered 2013-12-23 (×2): 1450 [IU]/h via INTRAVENOUS
  Filled 2013-12-22 (×4): qty 250

## 2013-12-22 MED ORDER — METOPROLOL TARTRATE 25 MG PO TABS
25.0000 mg | ORAL_TABLET | Freq: Two times a day (BID) | ORAL | Status: DC
  Administered 2013-12-22 – 2013-12-30 (×16): 25 mg via ORAL
  Filled 2013-12-22 (×19): qty 1

## 2013-12-22 MED ORDER — HEPARIN BOLUS VIA INFUSION
4000.0000 [IU] | Freq: Once | INTRAVENOUS | Status: AC
  Administered 2013-12-22: 4000 [IU] via INTRAVENOUS
  Filled 2013-12-22: qty 4000

## 2013-12-22 MED ORDER — SODIUM CHLORIDE 0.9 % IV SOLN
INTRAVENOUS | Status: DC
Start: 2013-12-22 — End: 2013-12-26
  Administered 2013-12-22 – 2013-12-26 (×5): via INTRAVENOUS

## 2013-12-22 MED ORDER — CYCLOPHOSPHAMIDE 25 MG PO CAPS
150.0000 mg | ORAL_CAPSULE | Freq: Every day | ORAL | Status: DC
  Administered 2013-12-22 – 2013-12-30 (×9): 150 mg via ORAL
  Filled 2013-12-22 (×9): qty 6

## 2013-12-22 MED ORDER — CYCLOPHOSPHAMIDE 25 MG PO TABS
150.0000 mg | ORAL_TABLET | Freq: Every day | ORAL | Status: DC
  Filled 2013-12-22: qty 3

## 2013-12-22 NOTE — Progress Notes (Addendum)
[facility] PROGRESS NOTE  Bryan Hubbard YJE:[id number] DOB: 11-17-1940 DOA: 12/18/2013 PCP: [name], MD  Assessment/Plan  Acute hypoxic respiratory failure with bilateral pulmonary infiltrates on CXR.  Previously treated as outpatient, but symptoms worsened.  Legionella/S. pneumo from clinic were both negative.  He has not improved much with antibiotics.  I am concerned that this may be secondary to an underlying vasculitis or pulmonary renal syndrome vs. Diastolic heart failure exac. - continue rocephin day 5 - s/p 1.5 gm azithromycin - wean oxygen as tolerated -  Follow up ECHO -  Consider ct chest +/- pulm consultation  AKI, received records from primary, baseline creatinine around 0.8 and had risen to 1.2 at last visit 3/19.  + hematuria and very elevated ESR 140 and ferritin 1602.  Very concerning for vasculitis.  Discussed case with Dr. Mercy Moore.  Also discussed case with family member who is also a nephrologist.  The patient's sister has multiple autoantibodies which have been successfully treated with plaquenil, and his mother may also have suffered from vasculitis which eventually led to dialysis and death. -  UA:  + 21-50 RBC, no WBC, no casts -  FENA 0.89 -  RUS:  wnl -  Continue IVF -  Continue to hold nephrotoxins such as diclofenac  -  Nephrology consult pending -  ESR 140 -  ANA neg -  ANCA, anti-GBM pending  Normocytic anemia, hgb low but stable -  Iron studies consistent with inflammation, very elevated ferritin -  B12 wnl -  Folate pending -  Occult stool negative -  SPEP/UPEP/IFE pending   Aflutter - presently rate controlled. Cont beta blocker.  -  Hold warfarin in case patient needs renal biopsy  Diastolic heart failure, chronic, pro-BNP elevated -  Daily weights (stable) -  Strict I/O -  Consider restarting lasix  Pleuritic right chest pain, likely related to pleuritis from pneumonia vs. Less likely pericarditis.  PE less likely as patient  was therapeutic on warfarin at time that this occurred.  Pain only occurrs with deep inspiration and movement.   -  Telemetry demonstrated chronic a-flutter, rate controlled -  No evidence of ischemia on ECG -  Continue tramadol prn -  ECHO   BPH, continue avodart/flomax -  [place]:  No obstruction  Leukocytosis, stable.  Elevated due to inflammation Thrombocytosis, stable, acute phase reactant Hyponatremia, mild, asymptomatic, and stable.  Question mild SIADH.  Repeat BMP in AM  Diet:  Healthy heart Access:  PIV IVF:  yes Proph:  warfarin  Code Status: full Family Communication: patient alone Disposition Plan:   Nephrology consultation/ECHO pending.     Consultants:  none  Procedures:  CXR  Antibiotics:  Azithromycin 12/18/13>>>  Rocephin 12/18/13 x 3 days, 1.5gm total   HPI/Subjective:  States he feels well.  Still SOB with exertion and oxygen levels decrease to the mid-80s on RA.  Making copious urine that is normal yellow in color.     Objective: Filed Vitals:   12/21/13 1416 12/21/13 2050 12/22/13 0450 12/22/13 0455  BP: 116/65 123/68 109/66   Pulse: 81 85 81   Temp: 98 F (36.7 C) 99.2 F (37.3 C) 98.4 F (36.9 C)   TempSrc: Oral Oral Oral   Resp: $Remo'20 18 18   'cvlsc$ Height:      Weight:    85.412 kg (188 lb 4.8 oz)  SpO2: 92% 98% 97%     Intake/Output Summary (Last 24 hours) at 12/22/13 0853 Last data filed at 12/22/13 0700  Gross per 24 hour  Intake   2100 ml  Output   1476 ml  Net    624 ml   Filed Weights   12/20/13 0537 12/21/13 0500 12/22/13 0455  Weight: 86.1 kg (189 lb 13.1 oz) 85.684 kg (188 lb 14.4 oz) 85.412 kg (188 lb 4.8 oz)    Exam:   General:  CM, No acute distress  HEENT:  NCAT, MMM  Cardiovascular:  RRR, nl S1, S2 no mrg, 2+ pulses, warm extremities  Respiratory:  CTAB, no increased WOB  Abdomen:   NABS, soft, NT/ND  MSK:   Normal tone and bulk, no LEE  Neuro:  Grossly intact  Data Reviewed: Basic Metabolic Panel:  Recent  Labs Lab 12/18/13 1635 12/19/13 0348 12/20/13 0333 12/21/13 0335 12/22/13 0338  NA 132* 135* 131* 130* 133*  K 4.2 4.0 4.9 4.5 4.6  CL 95* 99 98 98 99  CO2 $Re'20 22 21 21 21  'nZO$ GLUCOSE 117* 95 104* 95 106*  BUN 31* 35* 37* 34* 32*  CREATININE 1.53* 1.66* 1.57* 1.58* 1.56*  CALCIUM 9.8 9.1 8.7 8.7 9.0   Liver Function Tests:  Recent Labs Lab 12/18/13 1635  AST 63*  ALT 70*  ALKPHOS 104  BILITOT 1.2  PROT 7.8  ALBUMIN 3.0*   No results found for this basename: LIPASE, AMYLASE,  in the last 168 hours No results found for this basename: AMMONIA,  in the last 168 hours CBC:  Recent Labs Lab 12/18/13 1635 12/19/13 0348 12/20/13 0333 12/21/13 0335 12/22/13 0338  WBC 15.5* 11.8* 13.5* 12.5* 13.0*  HGB 9.3* 9.0* 8.1* 7.7* 8.3*  HCT 27.3* 26.5* 25.3* 23.6* 24.9*  MCV 88.6 88.6 90.0 89.4 88.0  PLT 456* 438* 430* 438* 432*   Cardiac Enzymes: No results found for this basename: CKTOTAL, CKMB, CKMBINDEX, TROPONINI,  in the last 168 hours BNP (last 3 results)  Recent Labs  12/18/13 1635  PROBNP 5296.0*   CBG: No results found for this basename: GLUCAP,  in the last 168 hours  No results found for this or any previous visit (from the past 240 hour(s)).   Studies: US Renal  12/20/2013   CLINICAL DATA:  Acute kidney injury.  EXAM: RENAL/URINARY TRACT ULTRASOUND COMPLETE  COMPARISON:  CT ABD-PELV WO/W CM (HEMATURIA) dated 08/14/2010  FINDINGS: Right Kidney:  Length: 13.0 cm. Echogenicity within normal limits. No mass or hydronephrosis visualized.  Left Kidney:  Length: 12.8 cm. Echogenicity within normal limits. No mass or hydronephrosis visualized.  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 Bladder is otherwise unremarkable in appearance.  IMPRESSION: 1. Unremarkable appearance of the kidneys.  No hydronephrosis. 2. Bladder diverticulum.   Electronically Signed   By: Logan Bores   On: 12/20/2013 18:39    Scheduled Meds: . cefTRIAXone  (ROCEPHIN)  IV  1 g Intravenous Q24H  . Co-Enzyme Q10  300 mg Oral QHS  . dutasteride  0.5 mg Oral Daily   And  . tamsulosin  0.4 mg Oral Daily  . ezetimibe-simvastatin  1 tablet Oral QHS  . latanoprost  1 drop Both Eyes QHS  . magnesium oxide  200 mg Oral QHS  . metoprolol tartrate  25 mg Oral BID  . montelukast  10 mg Oral QHS  . pantoprazole  40 mg Oral Daily  . sodium chloride  3 mL Intravenous Q12H  . traMADol  50 mg Oral BID  . Warfarin - Pharmacist Dosing Inpatient   Does not apply  q1800   Continuous Infusions: . sodium chloride      Principal Problem:   CAP (community acquired pneumonia) Active Problems:   HYPERTENSION, BENIGN   Atrial fibrillation   DVT   Atrial flutter   Leukocytosis   Community acquired pneumonia   AKI (acute kidney injury)    Time spent: 30 min    Devinn Hurwitz, [facility] Pager [phone]. If 7PM-7AM, please contact night-coverage at [email or web address], password [facility] 12/22/2013, 8:53 AM  LOS: 4 days

## 2013-12-22 NOTE — Progress Notes (Signed)
Pt ambulated length of hall way x1 on room air. O2 sats with ambulation dropped to 85% and Pt demonstrated moderate shortness of breath with ambulation. Heart rate increased to 120s with ambulation on room air. Returned to room and placed back on 1.5 liters of  O2 with O2 sats increased to 95 and pulse rate down to 80s.

## 2013-12-22 NOTE — Progress Notes (Signed)
  Echocardiogram 2D Echocardiogram has been performed.  Basilia Jumbo 12/22/2013, 3:33 PM

## 2013-12-22 NOTE — Progress Notes (Signed)
ANTICOAGULATION CONSULT NOTE - Initial Consult  Pharmacy Consult for IV heparin Indication: atrial fibrillation  No Known Allergies  Patient Measurements: Height: 5' 10.5" (179.1 cm) Weight: 188 lb 4.8 oz (85.412 kg) IBW/kg (Calculated) : 74.15  Vital Signs: BP: 116/68 mmHg (03/27 1038) Pulse Rate: 86 (03/27 1038)  Labs:  Recent Labs  12/20/13 0333 12/21/13 0335 12/22/13 0338  HGB 8.1* 7.7* 8.3*  HCT 25.3* 23.6* 24.9*  PLT 430* 438* 432*  LABPROT 21.6* 18.8* 17.8*  INR 1.94* 1.62* 1.51*  CREATININE 1.57* 1.58* 1.56*    Estimated Creatinine Clearance: 44.3 ml/min (by C-G formula based on Cr of 1.56).   Medical History: Past Medical History  Diagnosis Date  . Atrial fibrillation   . Chronic diastolic heart failure   . DVT   . HYPERLIPIDEMIA   . HYPERTENSION, BENIGN   . ALLERGIC ASTHMA   . Palpitations     Medications:  Scheduled:  . cefTRIAXone (ROCEPHIN)  IV  1 g Intravenous Q24H  . Co-Enzyme Q10  300 mg Oral QHS  . Cyclophosphamide  150 mg Oral Daily  . dutasteride  0.5 mg Oral Daily   And  . tamsulosin  0.4 mg Oral Daily  . ezetimibe-simvastatin  1 tablet Oral QHS  . latanoprost  1 drop Both Eyes QHS  . magnesium oxide  200 mg Oral QHS  . methylPREDNISolone (SOLU-MEDROL) injection  500 mg Intravenous Q24H  . metoprolol tartrate  25 mg Oral BID  . montelukast  10 mg Oral QHS  . pantoprazole  40 mg Oral Daily  . sodium chloride  3 mL Intravenous Q12H  . traMADol  50 mg Oral BID   Infusions:  . sodium chloride 75 mL/hr at 12/22/13 1827    Assessment: 73 yo M with CAP, Patient started on levaquin outpatient on 3/19 and was sent to ER today by PCP for continued SOB x 2 weeks and bilateral infiltrates on CXR. 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 Home warfarin dose = 2 mg daily; Last dose reported 3/22. Per renal patient now with AKI and may need renal biopsy so warfarin was placed on hold. In mean time, Md has  ordered for heparin per pharmacy dosing to start while INR subtherapeutic.  Hgb 8.3, Plt 432  INR 1.51 - warfarin now on hold as above  No reported bleeding  Goal of Therapy:  Heparin level 0.3-0.7 units/ml Monitor platelets by anticoagulation protocol: Yes   Plan:  1) 4000 unit IV heparin bolus then 2) 1200 units/hr IV heparin rate 3) 8 hr heparin level after heparin started 4) Daily heparin level and CBC   Adrian Saran, PharmD, BCPS Pager [phone] 12/22/2013 9:15 PM

## 2013-12-22 NOTE — Consult Note (Signed)
Renal Service Consult Note [name]  Bryan Hubbard 12/22/2013 Bryan Hubbard D Requesting Physician:  Dr. Sheran Hubbard    Reason for Consult:  Elevated Cr, microhematuria, lung disease  HPI: The patient is a 73 y.o. year-old with hx of HTN (1998), afib, diast HF, DVT and HL.  He was sick a month ago with URI and bronchitis. Treated with abx and symptoms improved, but cough lingered. He presented with SOB w pulm infiltrates on CXR and was admitted on 3/23 with dx of CAP. Creat was up and ACEI and lasix were held, he rec'd IVF"s.  No fevers.  Creat did not improve, UA showed microhematuria and renal service consulted today re: possible vasculitis.   Pt denies hx of renal failure, rash or discolored urine.  Voiding without difficulty.  No hemoptysis, no prod cough, +dry cough.  No rash, no lumps or masses, no epistaxis.   Getting OOB wtihout difficulty.  No SOB.   ROS  no abd pain  no n/v/d  no rash  no jt pains  Past Medical History  Past Medical History  Diagnosis Date  . Atrial fibrillation   . Chronic diastolic heart failure   . DVT   . HYPERLIPIDEMIA   . HYPERTENSION, BENIGN   . ALLERGIC ASTHMA   . Palpitations    Past Surgical History  Past Surgical History  Procedure Laterality Date  . Bone marrow biopsy     Family History  Family History  Problem Relation Age of Onset  . Stroke Mother   . Heart attack Maternal Grandmother    Social History  reports that he has quit smoking. He does not have any smokeless tobacco history on file. His alcohol and drug histories are not on file. Allergies No Known Allergies Home medications Prior to Admission medications   Medication Sig Start Date End Date Taking? Authorizing Provider  bimatoprost (LUMIGAN) 0.01 % SOLN Place 1 drop into both eyes every morning.   Yes Historical Provider, MD  Calcium Carb-Cholecalciferol (CALCIUM + D3 PO) Take 1 tablet by mouth daily.   Yes Historical Provider, MD  Coenzyme Q10 300 MG  CAPS Take 300 mg by mouth every evening.   Yes Historical Provider, MD  cyanocobalamin (,VITAMIN B-12,) 1000 MCG/ML injection Inject 1,000 mcg into the muscle every 30 (thirty) days.    Yes Historical Provider, MD  diclofenac (VOLTAREN) 75 MG EC tablet Take 75 mg by mouth daily.  07/04/13  Yes Historical Provider, MD  Dutasteride-Tamsulosin HCl 0.5-0.4 MG CAPS Take 1 capsule by mouth daily.   Yes Historical Provider, MD  furosemide (LASIX) 20 MG tablet Take 20 mg by mouth 2 (two) times a week. On Tuesday and Thursday.   Yes Historical Provider, MD  levofloxacin (LEVAQUIN) 750 MG tablet Take 750 mg by mouth daily. For 10 days. 12/14/13  Yes Historical Provider, MD  Magnesium 250 MG TABS Take 1 tablet by mouth every evening.   Yes Historical Provider, MD  metoprolol tartrate (LOPRESSOR) 25 MG tablet Take 25 mg by mouth 2 (two) times daily. 06/15/13  Yes Historical Provider, MD  montelukast (SINGULAIR) 10 MG tablet Take 10 mg by mouth at bedtime.  06/10/13  Yes Historical Provider, MD  Multiple Vitamin (MULTIVITAMIN) tablet Take 1 tablet by mouth daily.   Yes Historical Provider, MD  omeprazole (PRILOSEC) 20 MG capsule Take 20 mg by mouth daily.  08/17/13  Yes Historical Provider, MD  potassium chloride SA (K-DUR,KLOR-CON) 20 MEQ tablet Take 20 mEq by mouth 2 (two) times  a week. On Tuesday and Thursday.   Yes Historical Provider, MD  quinapril (ACCUPRIL) 20 MG tablet Take 20 mg by mouth daily.    Yes Historical Provider, MD  traMADol (ULTRAM) 50 MG tablet Take 50 mg by mouth 2 (two) times daily at 10 am and 4 pm.  07/14/13  Yes Historical Provider, MD  vitamin C (ASCORBIC ACID) 500 MG tablet Take 500 mg by mouth daily.   Yes Historical Provider, MD  VYTORIN 10-20 MG per tablet Take 1 tablet by mouth at bedtime.  08/27/13  Yes Historical Provider, MD  warfarin (COUMADIN) 2 MG tablet Take 2 mg by mouth daily.   Yes Historical Provider, MD   Liver Function Tests  Recent Labs Lab 12/18/13 1635  AST 63*   ALT 70*  ALKPHOS 104  BILITOT 1.2  PROT 7.8  ALBUMIN 3.0*   No results found for this basename: LIPASE, AMYLASE,  in the last 168 hours CBC  Recent Labs Lab 12/20/13 0333 12/21/13 0335 12/22/13 0338  WBC 13.5* 12.5* 13.0*  HGB 8.1* 7.7* 8.3*  HCT 25.3* 23.6* 24.9*  MCV 90.0 89.4 88.0  PLT 430* 438* 675*   Basic Metabolic Panel  Recent Labs Lab 12/18/13 1635 12/19/13 0348 12/20/13 0333 12/21/13 0335 12/22/13 0338  NA 132* 135* 131* 130* 133*  K 4.2 4.0 4.9 4.5 4.6  CL 95* 99 98 98 99  CO2 $Re'20 22 21 21 21  'zes$ GLUCOSE 117* 95 104* 95 106*  BUN 31* 35* 37* 34* 32*  CREATININE 1.53* 1.66* 1.57* 1.58* 1.56*  CALCIUM 9.8 9.1 8.7 8.7 9.0    Filed Vitals:   12/22/13 0450 12/22/13 0455 12/22/13 1038 12/22/13 1100  BP: 109/66  116/68   Pulse: 81  86   Temp: 98.4 F (36.9 C)     TempSrc: Oral     Resp: 18     Height:      Weight:  85.412 kg (188 lb 4.8 oz)    SpO2: 97%  98% 85%   Exam Alert, no distress, occ coughing No rash, cyanosis or gangrene Sclera anicteric, throat clear No JVD No joint swelling or effusions Chest clear bilat RRR no MRG Abd soft, NTND, no mass or HSM GU nl male genitalia No LE or UE edema, no petechiae or purpura Neuro is nf, Ox3  UA (lab) no protein, 0-2 WBC, 21-50 RBC UA (undersigned) 20-25 wbc/hpf, 25-50 rbc/hpf, numerous dysmorphic rbc's (~50%), small number of casts including wbc, mixed rbc/wbc casts and gran casts Anti MPO Ab + >8.0 (<1.0) Anti GBM Ab + > 8.0 (<1.0) Anti serine protease <1.0    Assessment/Plan: 1 Renal insufficiency / microscopic hematuria / pulm infiltrates- 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 The +anti-GBM may be pathogenic or may not be, and he will have a renal biopsy on Monday which will determine which of these is the case.  He does not require plasmapheresis at this time, he is  stable and nontoxic without pulm hemorrhage.  With +anca we will go ahead and start Cytoxan as well, oral , $Remo'2mg'WaHPH$ /kg for now.  Continue to hold/avoid warfarin and other anticoagulants with renal biopsy pending.Thanks for the referral and good pickup on this case Dr Bryan Hubbard.   2 Anemia 3 HTN- no volume excess, good UOP, start IVF"s  Kelly Splinter MD (pgr) [phone]    ([phone] 12/22/2013, 4:41 PM

## 2013-12-23 DIAGNOSIS — N059 Unspecified nephritic syndrome with unspecified morphologic changes: Secondary | ICD-10-CM

## 2013-12-23 LAB — BASIC METABOLIC PANEL
BUN: 36 mg/dL — ABNORMAL HIGH (ref 6–23)
CO2: 20 mEq/L (ref 19–32)
CREATININE: 1.56 mg/dL — AB (ref 0.50–1.35)
Calcium: 9.3 mg/dL (ref 8.4–10.5)
Chloride: 102 mEq/L (ref 96–112)
GFR calc Af Amer: 49 mL/min — ABNORMAL LOW (ref >90)
GFR, EST NON AFRICAN AMERICAN: 42 mL/min — AB (ref >90)
GLUCOSE: 174 mg/dL — AB (ref 70–99)
Potassium: 4.8 mEq/L (ref 3.7–5.3)
Sodium: 134 mEq/L — ABNORMAL LOW (ref 137–147)

## 2013-12-23 LAB — HEPARIN LEVEL (UNFRACTIONATED)
Heparin Unfractionated: <0.1 IU/mL — ABNORMAL LOW (ref 0.30–0.70)
Heparin Unfractionated: 0.2 IU/mL — ABNORMAL LOW (ref 0.30–0.70)

## 2013-12-23 LAB — CBC
HCT: 25.3 % — ABNORMAL LOW (ref 39.0–52.0)
HEMOGLOBIN: 8.3 g/dL — AB (ref 13.0–17.0)
MCH: 29.1 pg (ref 26.0–34.0)
MCHC: 32.8 g/dL (ref 30.0–36.0)
MCV: 88.8 fL (ref 78.0–100.0)
PLATELETS: 375 10*3/uL (ref 150–400)
RBC: 2.85 MIL/uL — ABNORMAL LOW (ref 4.22–5.81)
RDW: 13 % (ref 11.5–15.5)
WBC: 10.5 10*3/uL (ref 4.0–10.5)

## 2013-12-23 LAB — PROTIME-INR
INR: 1.51 — ABNORMAL HIGH (ref 0.00–1.49)
Prothrombin Time: 17.8 seconds — ABNORMAL HIGH (ref 11.6–15.2)

## 2013-12-23 MED ORDER — HEPARIN (PORCINE) IN NACL 100-0.45 UNIT/ML-% IJ SOLN
1700.0000 [IU]/h | INTRAMUSCULAR | Status: DC
  Filled 2013-12-23 (×2): qty 250

## 2013-12-23 MED ORDER — TUBERCULIN PPD 5 UNIT/0.1ML ID SOLN
5.0000 [IU] | Freq: Once | INTRADERMAL | Status: AC
  Administered 2013-12-23: 5 [IU] via INTRADERMAL
  Filled 2013-12-23 (×2): qty 0.1

## 2013-12-23 MED ORDER — HEPARIN BOLUS VIA INFUSION
2500.0000 [IU] | Freq: Once | INTRAVENOUS | Status: AC
  Administered 2013-12-23: 2500 [IU] via INTRAVENOUS
  Filled 2013-12-23: qty 2500

## 2013-12-23 NOTE — Progress Notes (Addendum)
[facility] PROGRESS NOTE  Bryan Hubbard:[id number] DOB: 05/21/1941 DOA: 12/18/2013 PCP: Bryan Amel, MD  Assessment/Plan  Acute hypoxic respiratory failure with bilateral pulmonary infiltrates on CXR.  Previously treated as outpatient, but symptoms worsened.  Legionella/S. pneumo from clinic were both negative.  He has not improved much with antibiotics.  I am concerned that this may be secondary to an underlying vasculitis or pulmonary renal syndrome vs. Diastolic heart failure exac. - continue rocephin day 6 of 7 - s/p 1.5 gm azithromycin - wean oxygen as tolerated -  ECHO:  Stable, no evidence of acute systolic or worsening diastolic heart failure.  Mod MR and pa pressure 48mmHg  Glomerulonephritis, active sediment.  MPO +, anti-GBM +, ANA -, ESR 140 -  Appreciate nephrology assistance -  FENA 0.89 -  RUS:  wnl -  Continue IVF -  Continue to hold nephrotoxins such as diclofenac  -  Cyclophosphamide started 3/27 -  Methylprednisolone started 3/27, day 2 of 3 -  IR for renal biopsy -  Consented for testing for hepatitis and HIV and TB on 3/28  Normocytic anemia due to inflammation, hgb low but stable -  Iron studies consistent with inflammation, very elevated ferritin -  B12 wnl -  Folate wnl -  Occult stool negative -  SPEP/UPEP/IFE pending   Aflutter - presently rate controlled. Cont beta blocker.  -  Hold warfarin in case patient needs renal biopsy -  Continue heparin gtt (full dose A/C because of history of DVT and patient probably hypercoagulable given high degree of inflammation)  Diastolic heart failure, chronic, pro-BNP elevated -  Daily weights (stable) -  Strict I/O  Pleuritic right chest pain, likely related to pleuritis from pneumonia vs. Less likely pericarditis.  PE less likely as patient was therapeutic on warfarin at time that this occurred.  Pain only occurrs with deep inspiration and movement.   -  Telemetry demonstrated chronic a-flutter, rate  controlled -  No evidence of ischemia on ECG -  Continue tramadol prn -  ECHO   BPH, continue avodart/flomax -  [place]:  No obstruction  Leukocytosis, stable.  Elevated due to inflammation Thrombocytosis, stable, acute phase reactant Hyponatremia, mild, asymptomatic, and resolving.  Question mild SIADH.  Repeat BMP in AM  Diet:  Healthy heart Access:  PIV IVF:  yes Proph:  Heparin + PPI (steroids + a/c)  Code Status: full Family Communication: patient alone Disposition Plan:   Receiving 3 days of IV steroids and awaiting renal biopsy   Consultants:  Nephrology  Procedures:  CXR  Antibiotics:  Azithromycin 12/18/13>>>  Rocephin 12/18/13 x 3 days, 1.5gm total   HPI/Subjective:  States his breathing is much better today and pleurisy is gone.  Still making copious clear yellow urine   Objective: Filed Vitals:   12/22/13 1038 12/22/13 1100 12/22/13 2247 12/23/13 0500  BP: 116/68  122/78 123/70  Pulse: 86  85 81  Temp:   98.1 F (36.7 C) 98.5 F (36.9 C)  TempSrc:   Oral Oral  Resp:   16 18  Height:      Weight:    84.913 kg (187 lb 3.2 oz)  SpO2: 98% 85% 97% 98%    Intake/Output Summary (Last 24 hours) at 12/23/13 1116 Last data filed at 12/23/13 0900  Gross per 24 hour  Intake 1450.65 ml  Output   3465 ml  Net -2014.35 ml   Filed Weights   12/21/13 0500 12/22/13 0455 12/23/13 0500  Weight: 85.684 kg (188  lb 14.4 oz) 85.412 kg (188 lb 4.8 oz) 84.913 kg (187 lb 3.2 oz)    Exam:   General:  CM, No acute distress  HEENT:  NCAT, MMM  Cardiovascular:  RRR, nl S1, S2 no mrg, 2+ pulses, warm extremities  Respiratory:  Few scattered rales, no rhonchi or wheeze, no increased WOB  Abdomen:   NABS, soft, NT/ND  MSK:   Normal tone and bulk, no LEE  Neuro:  Grossly intact  Data Reviewed: Basic Metabolic Panel:  Recent Labs Lab 12/19/13 0348 12/20/13 0333 12/21/13 0335 12/22/13 0338 12/23/13 0603  NA 135* 131* 130* 133* 134*  K 4.0 4.9 4.5 4.6 4.8   CL 99 98 98 99 102  CO2 _0 GLUCOSE 95 104* 95 106* 174*  BUN 35* 37* 34* 32* 36*  CREATININE 1.66* 1.57* 1.58* 1.56* 1.56*  CALCIUM 9.1 8.7 8.7 9.0 9.3   Liver Function Tests:  Recent Labs Lab 12/18/13 1635  AST 63*  ALT 70*  ALKPHOS 104  BILITOT 1.2  PROT 7.8  ALBUMIN 3.0*   No results found for this basename: LIPASE, AMYLASE,  in the last 168 hours No results found for this basename: AMMONIA,  in the last 168 hours CBC:  Recent Labs Lab 12/19/13 0348 12/20/13 0333 12/21/13 0335 12/22/13 0338 12/23/13 0603  WBC 11.8* 13.5* 12.5* 13.0* 10.5  HGB 9.0* 8.1* 7.7* 8.3* 8.3*  HCT 26.5* 25.3* 23.6* 24.9* 25.3*  MCV 88.6 90.0 89.4 88.0 88.8  PLT 438* 430* 438* 432* 375   Cardiac Enzymes: No results found for this basename: CKTOTAL, CKMB, CKMBINDEX, TROPONINI,  in the last 168 hours BNP (last 3 results)  Recent Labs  12/18/13 1635  PROBNP 5296.0*   CBG: No results found for this basename: GLUCAP,  in the last 168 hours  No results found for this or any previous visit (from the past 240 hour(s)).   Studies: No results found.  Scheduled Meds: . cefTRIAXone (ROCEPHIN)  IV  1 g Intravenous Q24H  . Co-Enzyme Q10  300 mg Oral QHS  . Cyclophosphamide  150 mg Oral Daily  . dutasteride  0.5 mg Oral Daily   And  . tamsulosin  0.4 mg Oral Daily  . ezetimibe-simvastatin  1 tablet Oral QHS  . latanoprost  1 drop Both Eyes QHS  . magnesium oxide  200 mg Oral QHS  . methylPREDNISolone (SOLU-MEDROL) injection  500 mg Intravenous Q24H  . metoprolol tartrate  25 mg Oral BID  . montelukast  10 mg Oral QHS  . pantoprazole  40 mg Oral Daily  . sodium chloride  3 mL Intravenous Q12H  . traMADol  50 mg Oral BID   Continuous Infusions: . sodium chloride 75 mL/hr at 12/23/13 0756  . heparin 1,450 Units/hr (12/23/13 0756)    Principal Problem:   CAP (community acquired pneumonia) Active Problems:   HYPERTENSION, BENIGN   Atrial fibrillation   DVT   Atrial  flutter   Leukocytosis   Community acquired pneumonia   AKI (acute kidney injury)    Time spent: 30 min    Bryan Hubbard, [facility] Pager [phone]. If 7PM-7AM, please contact night-coverage at [email or web address], password [facility] 12/23/2013, 11:16 AM  LOS: 5 days

## 2013-12-23 NOTE — Progress Notes (Signed)
IR aware of request for random renal biopsy. Will see pt and discuss likely plan for biopsy on Mon 3/30. Coumadin on hold. Last INR 1.51 Heparin gtt can be stopped prior to procedure.  Ascencion Dike PA-C Interventional Radiology 12/23/2013 1:36 PM

## 2013-12-23 NOTE — Progress Notes (Signed)
ANTICOAGULATION CONSULT NOTE - Follow up  Pharmacy Consult for IV heparin Indication: atrial fibrillation  No Known Allergies  Patient Measurements: Height: 5' 10.5" (179.1 cm) Weight: 187 lb 3.2 oz (84.913 kg) IBW/kg (Calculated) : 74.15  Vital Signs: Temp: 98.5 F (36.9 C) (03/28 0500) Temp src: Oral (03/28 0500) BP: 123/70 mmHg (03/28 0500) Pulse Rate: 81 (03/28 0500)  Labs:  Recent Labs  12/21/13 0335 12/22/13 0338 12/23/13 0603  HGB 7.7* 8.3* 8.3*  HCT 23.6* 24.9* 25.3*  PLT 438* 432* 375  LABPROT 18.8* 17.8* 17.8*  INR 1.62* 1.51* 1.51*  HEPARINUNFRC  --   --  <0.10*  CREATININE 1.58* 1.56* 1.56*    Estimated Creatinine Clearance: 44.3 ml/min (by C-G formula based on Cr of 1.56).  Assessment: 73 yo M admitted 3/23 with CAP. Patient was started on levaquin outpatient on 3/19 and subsequently admitted by PCP for continued SOB x 2 weeks and bilateral infiltrates on CXR. 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 Per Renal MD, patient now with AKI and may need renal biopsy so warfarin was placed on hold. Pharmacy has now been consulted to dose heparin bridge while INR subtherapeutic.  Last dose warfarin given inpatient on 3/26- on hold now as above  INR today remains subtherapeutic at 1.51  1st heparin level check this AM undetectable on 1200 units/hr  Hgb 8.3, Plt 375 - both stable  No reported bleeding or infusion line issues per RN  SCr remains elevated a 1.56, CrCl 44 ml/hr  Goal of Therapy:  Heparin level 0.3-0.7 units/ml Monitor platelets by anticoagulation protocol: Yes   Plan:  1) 2500 unit IV heparin bolus then 2) increase heparin gtt to 1450 units/hr  3) 8 hr heparin level after heparin started 4) Daily heparin level and CBC  Thank you for the consult.  Johny Drilling, PharmD, BCPS Pager: [phone] Pharmacy: [phone] 12/23/2013 7:48 AM

## 2013-12-23 NOTE — Progress Notes (Signed)
TB test given at this time. Lot #[id number], Exp. 3.28.15 LUFA

## 2013-12-23 NOTE — Progress Notes (Signed)
ANTICOAGULATION CONSULT NOTE - Follow up  Pharmacy Consult for IV heparin Indication: atrial fibrillation  No Known Allergies  Patient Measurements: Height: 5' 10.5" (179.1 cm) Weight: 187 lb 3.2 oz (84.913 kg) IBW/kg (Calculated) : 74.15 Heparin wt = 85kg  Vital Signs: Temp: 97.9 F (36.6 C) (03/28 1409) Temp src: Oral (03/28 1409) BP: 110/63 mmHg (03/28 1409) Pulse Rate: 98 (03/28 1409)  Labs:  Recent Labs  12/21/13 0335 12/22/13 0338 12/23/13 0603 12/23/13 0820 12/23/13 1838  HGB 7.7* 8.3* 8.3*  --   --   HCT 23.6* 24.9* 25.3*  --   --   PLT 438* 432* 375  --   --   LABPROT 18.8* 17.8* 17.8*  --   --   INR 1.62* 1.51* 1.51*  --   --   HEPARINUNFRC  --   --  <0.10* 0.20* <0.10*  CREATININE 1.58* 1.56* 1.56*  --   --     Estimated Creatinine Clearance: 44.3 ml/min (by C-G formula based on Cr of 1.56).  Assessment: 73 yo M admitted 3/23 with CAP. Patient was started on levaquin outpatient on 3/19 and subsequently admitted by PCP for continued SOB x 2 weeks and bilateral infiltrates on CXR. 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 Per Renal MD, patient now with AKI and may need renal biopsy so warfarin was placed on hold. Pharmacy has now been consulted to dose heparin bridge while INR subtherapeutic.  Last dose warfarin given inpatient on 3/26- on hold now as above  INR today remains subtherapeutic at 1.51  Heparin level remains undetectable following rate increase from 1200 units/hr to 1450 units/hr this am.   No reported bleeding or infusion line issues per RN  SCr remains elevated a 1.56, CrCl 44 ml/hr  Goal of Therapy:  Heparin level 0.3-0.7 units/ml Monitor platelets by anticoagulation protocol: Yes   Plan:  1) increase heparin gtt to 1700 units/hr  2) 8 hr heparin level after heparin started 3) Daily heparin level and CBC  Thank you for the consult.  Doreene Eland, PharmD, BCPS.   Pager: [phone] 12/23/2013 7:09 PM

## 2013-12-24 ENCOUNTER — Encounter (HOSPITAL_COMMUNITY): Payer: Self-pay | Admitting: Radiology

## 2013-12-24 DIAGNOSIS — D638 Anemia in other chronic diseases classified elsewhere: Secondary | ICD-10-CM

## 2013-12-24 DIAGNOSIS — N059 Unspecified nephritic syndrome with unspecified morphologic changes: Secondary | ICD-10-CM

## 2013-12-24 DIAGNOSIS — D75839 Thrombocytosis, unspecified: Secondary | ICD-10-CM

## 2013-12-24 DIAGNOSIS — D473 Essential (hemorrhagic) thrombocythemia: Secondary | ICD-10-CM

## 2013-12-24 LAB — HEPATITIS C ANTIBODY: HCV Ab: NEGATIVE

## 2013-12-24 LAB — CBC
HEMATOCRIT: 22.6 % — AB (ref 39.0–52.0)
HEMOGLOBIN: 7.8 g/dL — AB (ref 13.0–17.0)
MCH: 30.4 pg (ref 26.0–34.0)
MCHC: 34.5 g/dL (ref 30.0–36.0)
MCV: 87.9 fL (ref 78.0–100.0)
PLATELETS: 431 10*3/uL — AB (ref 150–400)
RBC: 2.57 MIL/uL — AB (ref 4.22–5.81)
RDW: 13 % (ref 11.5–15.5)
WBC: 24.2 10*3/uL — AB (ref 4.0–10.5)

## 2013-12-24 LAB — BASIC METABOLIC PANEL
BUN: 42 mg/dL — ABNORMAL HIGH (ref 6–23)
CHLORIDE: 105 meq/L (ref 96–112)
CO2: 19 mEq/L (ref 19–32)
Calcium: 8.7 mg/dL (ref 8.4–10.5)
Creatinine, Ser: 1.56 mg/dL — ABNORMAL HIGH (ref 0.50–1.35)
GFR calc Af Amer: 49 mL/min — ABNORMAL LOW (ref >90)
GFR calc non Af Amer: 42 mL/min — ABNORMAL LOW (ref >90)
Glucose, Bld: 157 mg/dL — ABNORMAL HIGH (ref 70–99)
POTASSIUM: 4.2 meq/L (ref 3.7–5.3)
SODIUM: 134 meq/L — AB (ref 137–147)

## 2013-12-24 LAB — PROTIME-INR
INR: 1.47 (ref 0.00–1.49)
Prothrombin Time: 17.4 seconds — ABNORMAL HIGH (ref 11.6–15.2)

## 2013-12-24 LAB — HEPARIN LEVEL (UNFRACTIONATED)
HEPARIN UNFRACTIONATED: 0.25 [IU]/mL — AB (ref 0.30–0.70)
Heparin Unfractionated: 0.12 IU/mL — ABNORMAL LOW (ref 0.30–0.70)
Heparin Unfractionated: 0.33 IU/mL (ref 0.30–0.70)

## 2013-12-24 LAB — HEPATITIS B CORE ANTIBODY, IGM: HEP B C IGM: NONREACTIVE

## 2013-12-24 LAB — HEPATITIS B SURFACE ANTIGEN: HEP B S AG: NEGATIVE

## 2013-12-24 LAB — HIV ANTIBODY (ROUTINE TESTING W REFLEX): HIV: NONREACTIVE

## 2013-12-24 MED ORDER — HEPARIN (PORCINE) IN NACL 100-0.45 UNIT/ML-% IJ SOLN
1950.0000 [IU]/h | INTRAMUSCULAR | Status: DC
  Filled 2013-12-24 (×2): qty 250

## 2013-12-24 MED ORDER — HEPARIN (PORCINE) IN NACL 100-0.45 UNIT/ML-% IJ SOLN
2100.0000 [IU]/h | INTRAMUSCULAR | Status: DC
  Administered 2013-12-24 (×2): 2100 [IU]/h via INTRAVENOUS
  Filled 2013-12-24 (×3): qty 250

## 2013-12-24 MED ORDER — PREDNISONE 10 MG PO TABS
60.0000 mg | ORAL_TABLET | Freq: Every day | ORAL | Status: DC
  Administered 2013-12-25 – 2013-12-30 (×6): 60 mg via ORAL
  Filled 2013-12-24 (×8): qty 1

## 2013-12-24 NOTE — Progress Notes (Signed)
ANTICOAGULATION CONSULT NOTE - Follow up  Pharmacy Consult for IV heparin Indication: atrial fibrillation  No Known Allergies  Patient Measurements: Height: 5' 10.5" (179.1 cm) Weight: 191 lb 6.4 oz (86.818 kg) IBW/kg (Calculated) : 74.15 Heparin wt = 85kg  Vital Signs: Temp: 97.6 F (36.4 C) (03/29 1422) Temp src: Oral (03/29 1422) BP: 111/63 mmHg (03/29 1422) Pulse Rate: 90 (03/29 1422)  Labs:  Recent Labs  12/22/13 0338 12/23/13 0603  12/23/13 1838 12/24/13 0415 12/24/13 1426  HGB 8.3* 8.3*  --   --  7.8*  --   HCT 24.9* 25.3*  --   --  22.6*  --   PLT 432* 375  --   --  431*  --   LABPROT 17.8* 17.8*  --   --  17.4*  --   INR 1.51* 1.51*  --   --  1.47  --   HEPARINUNFRC  --  <0.10*  < > <0.10* 0.12* 0.25*  CREATININE 1.56* 1.56*  --   --  1.56*  --   < > = values in this interval not displayed.  Estimated Creatinine Clearance: 44.3 ml/min (by C-G formula based on Cr of 1.56).  Assessment: 73 yo M admitted 3/23 with CAP. Patient was started on levaquin outpatient on 3/19 and subsequently admitted by PCP for continued SOB x 2 weeks and bilateral infiltrates on CXR. 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 Per Renal MD, patient now with AKI and may need renal biopsy so warfarin was placed on hold. Pharmacy has now been consulted to dose heparin bridge while INR subtherapeutic.  Last dose warfarin given inpatient on 3/26- on hold now as above  INR today remains subtherapeutic at 1.47  Heparin level = 0.25 on a rate of 1950 units/hr; subtherapeutic    Currently at 23 units/kg/hr  No reported bleeding or infusion line issues    SCr remains elevated a 1.47, CrCl 44 ml/hr  H/H down slightly (hgb = 7.8 this AM)  Note plan for renal biopsy on 3/30.  Goal of Therapy:  Heparin level 0.3-0.7 units/ml Monitor platelets by anticoagulation protocol: Yes   Plan:  1) Increase  heparin gtt to 2100 units/hr  2) 8 hr heparin level after heparin started 3) Daily heparin level and CBC  Doreene Eland, PharmD, BCPS.   Pager: [phone] 12/24/2013 2:57 PM

## 2013-12-24 NOTE — Progress Notes (Signed)
[facility] PROGRESS NOTE  Bryan Hubbard EXN:[id number] DOB: 1941-05-25 DOA: 12/18/2013 PCP: [name], MD  Assessment/Plan  Acute hypoxic respiratory failure with bilateral pulmonary infiltrates on CXR.  Previously treated as outpatient, but symptoms worsened.  Legionella/S. pneumo from clinic were both negative.  He has not improved much with antibiotics.  I am concerned that this may be secondary to an underlying vasculitis or pulmonary renal syndrome vs. Diastolic heart failure exac. - continue rocephin day 7 of 7 - s/p 1.5 gm azithromycin - wean oxygen as tolerated  Glomerulonephritis, active sediment.  MPO +, anti-GBM +, ANA -, ESR 140 -  Appreciate nephrology assistance -  FENA 0.89 -  RUS:  wnl -  Continue IVF -  Continue to hold nephrotoxins such as diclofenac  -  Cyclophosphamide started 3/27 -  Methylprednisolone started 3/27, day 3 of 3 -  Appreciate IR assistance -  NPO at [facility] for renal biopsy tomorrow -  Consented for testing for hepatitis and HIV and TB on 3/28  Normocytic anemia due to inflammation, hgb low but stable -  Iron studies consistent with inflammation, very elevated ferritin -  B12 wnl -  Folate wnl -  Occult stool negative -  SPEP/UPEP/IFE pending   Aflutter - presently rate controlled. Cont beta blocker.  -  Hold warfarin in case patient needs renal biopsy -  Continue heparin gtt (full dose A/C because of history of DVT and patient probably hypercoagulable given high degree of inflammation)  Diastolic heart failure, chronic, pro-BNP elevated -  Daily weights (stable) -  Strict I/O  Pleuritic right chest pain, likely related to pleuritis from pneumonia vs. Less likely pericarditis.  PE less likely as patient was therapeutic on warfarin at time that this occurred.  Pain only occurrs with deep inspiration and movement.   -  Telemetry demonstrated chronic a-flutter, rate controlled -  No evidence of ischemia on ECG -  Continue tramadol  prn -  ECHO:  Ejection fraction of 55%, mild LVH, mild to moderate mitral regurgitation, right ventricle mildly to moderately dilated with mild to moderate reduced systolic function and peak PA pressure of 45 mm mercury.   -  Would recommend repeating echocardiogram to assess right ventricular function and peak PA pressure after acute illness is resolved.  BPH, continue avodart/flomax -  [place]:  No obstruction  Leukocytosis, rising secondary to steroids.  No evidence of new infection and on last day of ceftriaxone.   Thrombocytosis, stable, acute phase reactant Hyponatremia, mild, asymptomatic, and resolving.  Question mild SIADH.  Repeat BMP in AM  Diet:  Healthy heart Access:  PIV IVF:  yes Proph:  Heparin + PPI (steroids + a/c)  Code Status: full Family Communication: patient alone Disposition Plan:   Receiving 3 days of IV steroids and awaiting renal biopsy   Consultants:  Nephrology  Procedures:  CXR  Antibiotics:  Azithromycin 12/18/13>>>  Rocephin 12/18/13 x 3 days, 1.5gm total   HPI/Subjective:  States his breathing is continues to slowly improve.  Still making copious clear yellow urine.  No new compliants   Objective: Filed Vitals:   12/23/13 1758 12/23/13 2100 12/24/13 0549 12/24/13 0555  BP:  125/63 120/72   Pulse:   81   Temp:  97.6 F (36.4 C) 97.5 F (36.4 C)   TempSrc:   Oral   Resp:  18 18   Height:      Weight:   87.227 kg (192 lb 4.8 oz) 86.818 kg (191 lb 6.4 oz)  SpO2: 96% 93% 95%     Intake/Output Summary (Last 24 hours) at 12/24/13 1256 Last data filed at 12/24/13 0556  Gross per 24 hour  Intake 2175.5 ml  Output   1175 ml  Net 1000.5 ml   Filed Weights   12/23/13 0500 12/24/13 0549 12/24/13 0555  Weight: 84.913 kg (187 lb 3.2 oz) 87.227 kg (192 lb 4.8 oz) 86.818 kg (191 lb 6.4 oz)    Exam:   General:  CM, No acute distress  HEENT:  NCAT, MMM, rosy cheeks  Cardiovascular:  RRR, nl S1, S2 no mrg, 2+ pulses, warm  extremities  Respiratory:  Few scattered rales, no rhonchi or wheeze, no increased WOB  Abdomen:   NABS, soft, NT/ND  MSK:   Normal tone and bulk, no LEE  Neuro:  Grossly intact  Data Reviewed: Basic Metabolic Panel:  Recent Labs Lab 12/20/13 0333 12/21/13 0335 12/22/13 0338 12/23/13 0603 12/24/13 0415  NA 131* 130* 133* 134* 134*  K 4.9 4.5 4.6 4.8 4.2  CL 98 98 99 102 105  CO2 $Re'21 21 21 20 19  'QuJ$ GLUCOSE 104* 95 106* 174* 157*  BUN 37* 34* 32* 36* 42*  CREATININE 1.57* 1.58* 1.56* 1.56* 1.56*  CALCIUM 8.7 8.7 9.0 9.3 8.7   Liver Function Tests:  Recent Labs Lab 12/18/13 1635  AST 63*  ALT 70*  ALKPHOS 104  BILITOT 1.2  PROT 7.8  ALBUMIN 3.0*   No results found for this basename: LIPASE, AMYLASE,  in the last 168 hours No results found for this basename: AMMONIA,  in the last 168 hours CBC:  Recent Labs Lab 12/20/13 0333 12/21/13 0335 12/22/13 0338 12/23/13 0603 12/24/13 0415  WBC 13.5* 12.5* 13.0* 10.5 24.2*  HGB 8.1* 7.7* 8.3* 8.3* 7.8*  HCT 25.3* 23.6* 24.9* 25.3* 22.6*  MCV 90.0 89.4 88.0 88.8 87.9  PLT 430* 438* 432* 375 431*   Cardiac Enzymes: No results found for this basename: CKTOTAL, CKMB, CKMBINDEX, TROPONINI,  in the last 168 hours BNP (last 3 results)  Recent Labs  12/18/13 1635  PROBNP 5296.0*   CBG: No results found for this basename: GLUCAP,  in the last 168 hours  No results found for this or any previous visit (from the past 240 hour(s)).   Studies: No results found.  Scheduled Meds: . cefTRIAXone (ROCEPHIN)  IV  1 g Intravenous Q24H  . Co-Enzyme Q10  300 mg Oral QHS  . Cyclophosphamide  150 mg Oral Daily  . dutasteride  0.5 mg Oral Daily   And  . tamsulosin  0.4 mg Oral Daily  . ezetimibe-simvastatin  1 tablet Oral QHS  . latanoprost  1 drop Both Eyes QHS  . magnesium oxide  200 mg Oral QHS  . methylPREDNISolone (SOLU-MEDROL) injection  500 mg Intravenous Q24H  . metoprolol tartrate  25 mg Oral BID  . montelukast   10 mg Oral QHS  . pantoprazole  40 mg Oral Daily  . sodium chloride  3 mL Intravenous Q12H  . traMADol  50 mg Oral BID  . tuberculin  5 Units Intradermal Once   Continuous Infusions: . sodium chloride 75 mL/hr at 12/23/13 0756  . heparin 1,950 Units/hr (12/24/13 0607)    Principal Problem:   CAP (community acquired pneumonia) Active Problems:   HYPERTENSION, BENIGN   Atrial fibrillation   DVT   Atrial flutter   Leukocytosis   Community acquired pneumonia   AKI (acute kidney injury)   Glomerulonephritis    Time spent:  30 min    Berneita Sanagustin, [facility] Pager [phone]. If 7PM-7AM, please contact night-coverage at [email or web address], password [facility] 12/24/2013, 12:56 PM  LOS: 6 days

## 2013-12-24 NOTE — Progress Notes (Signed)
  [facility] Progress Note   Subjective: feeling better, pleuritic CP is "gone" first time today and cough is better as well  Exam:  138 / 86 Alert, no distress  No JVD Chest clear bilat  RRR no MRG  Abd soft, NTND, no mass or HSM  GU nl male genitalia  No LE or UE edema, no petechiae or purpura  Neuro is nf, Ox3   UA (lab) no protein, 0-2 WBC, 21-50 RBC  UA (undersigned) 20-25 wbc/hpf, 25-50 rbc/hpf, numerous dysmorphic rbc's (~50%), small number of casts including wbc, mixed rbc/wbc casts and gran casts  Anti MPO Ab + >8.0 (<1.0)  Anti GBM Ab + > 8.0 (<1.0) Anti serine protease <1.0   Assessment/Plan:  1 Renal insufficiency / microscopic hematuria / active urine sediment / pulm infiltrates- 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  Second day of bolus steroids today, feeling better with relief of pleuritic CP already today.  Would not expect to see improvement in renal parameters for several days to weeks, but resp manifestations could improve quite rapidly with the high dose steroids. For renal biopsy on Monday per IR.  Continue IV steroids and po cytoxan for now.   2 Anemia  3 HTN- no volume excess    Kelly Splinter MD  pager [phone]    cell [phone]  12/23/2013, 4:35 PM     . cefTRIAXone (ROCEPHIN)  IV  1 g Intravenous Q24H  . Co-Enzyme Q10  300 mg Oral QHS  . Cyclophosphamide  150 mg Oral Daily  . dutasteride  0.5 mg Oral Daily   And  . tamsulosin  0.4 mg Oral Daily  . ezetimibe-simvastatin  1 tablet Oral QHS  . latanoprost  1 drop Both Eyes QHS  . magnesium oxide  200 mg Oral QHS  . methylPREDNISolone (SOLU-MEDROL) injection  500 mg Intravenous Q24H  . metoprolol tartrate  25 mg Oral BID  . montelukast  10 mg Oral QHS  . pantoprazole  40 mg Oral Daily  . sodium chloride  3 mL Intravenous Q12H  . traMADol  50 mg Oral BID  . tuberculin  5 Units Intradermal Once   .  sodium chloride 75 mL/hr at 12/23/13 0756  . heparin 2,100 Units/hr (12/24/13 1534)

## 2013-12-24 NOTE — Progress Notes (Signed)
  [facility] Progress Note   Subjective: No complaints  Filed Vitals:   12/23/13 2100 12/24/13 0549 12/24/13 0555 12/24/13 1422  BP: 125/63 120/72  111/63  Pulse:  81  90  Temp: 97.6 F (36.4 C) 97.5 F (36.4 C)  97.6 F (36.4 C)  TempSrc:  Oral  Oral  Resp: 18 18  18   Height:      Weight:  87.227 kg (192 lb 4.8 oz) 86.818 kg (191 lb 6.4 oz)   SpO2: 93% 95%  98%   Exam: Alert, no distress  No JVD  Chest clear bilat  RRR no MRG  Abd soft, NTND, no mass or HSM  GU nl male genitalia  No LE or UE edema, no petechiae or purpura  Neuro is nf, Ox3   UA (lab) no protein, 0-2 WBC, 21-50 RBC  UA (undersigned) 20-25 wbc/hpf, 25-50 rbc/hpf, numerous dysmorphic rbc's (~50%), small number of casts including wbc, mixed rbc/wbc casts and gran casts  Anti MPO Ab + >8.0 (<1.0)  Anti GBM Ab + > 8.0 (<1.0) Anti serine protease <1.0    Assessment/Plan:  1 Renal insufficiency / microscopic hematuria / active urine sediment / pulm infiltrates- 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 3rd day bolus steroids today, then prednisone 60/day po. Continue oral cytoxan pending biopsy results. For renal biopsy on Monday per IR. 2 Anemia  3 HTN- no volume excess 4 Leukocytosis- demargination due to IV steroids    Kelly Splinter MD  pager [phone]    cell [phone]  12/24/2013, 4:40 PM     Recent Labs Lab 12/22/13 0338 12/23/13 0603 12/24/13 0415  NA 133* 134* 134*  K 4.6 4.8 4.2  CL 99 102 105  CO2 21 20 19   GLUCOSE 106* 174* 157*  BUN 32* 36* 42*  CREATININE 1.56* 1.56* 1.56*  CALCIUM 9.0 9.3 8.7    Recent Labs Lab 12/18/13 1635  AST 63*  ALT 70*  ALKPHOS 104  BILITOT 1.2  PROT 7.8  ALBUMIN 3.0*    Recent Labs Lab 12/22/13 0338 12/23/13 0603 12/24/13 0415  WBC 13.0* 10.5 24.2*  HGB 8.3* 8.3* 7.8*  HCT 24.9* 25.3* 22.6*  MCV 88.0 88.8 87.9  PLT 432* 375 431*   .  cefTRIAXone (ROCEPHIN)  IV  1 g Intravenous Q24H  . Co-Enzyme Q10  300 mg Oral QHS  . Cyclophosphamide  150 mg Oral Daily  . dutasteride  0.5 mg Oral Daily   And  . tamsulosin  0.4 mg Oral Daily  . ezetimibe-simvastatin  1 tablet Oral QHS  . latanoprost  1 drop Both Eyes QHS  . magnesium oxide  200 mg Oral QHS  . methylPREDNISolone (SOLU-MEDROL) injection  500 mg Intravenous Q24H  . metoprolol tartrate  25 mg Oral BID  . montelukast  10 mg Oral QHS  . pantoprazole  40 mg Oral Daily  . sodium chloride  3 mL Intravenous Q12H  . traMADol  50 mg Oral BID  . tuberculin  5 Units Intradermal Once   . sodium chloride 75 mL/hr at 12/23/13 0756  . heparin 2,100 Units/hr (12/24/13 1534)

## 2013-12-24 NOTE — Consult Note (Signed)
HPI: Bryan Hubbard is an 73 y.o. male with hx of Afib and CHF and has been admitted with acute renal insufficiency. Workup has suggested glomerulonephritis and Nephrology team has recommended renal biopsy. Pt on chronic Coumadin, which has been held. He has recovered from acute hypoxic episode and is otherwise stable. IR is requested to discuss and proceed with renal biopsy. Chart, PMHx, labs, meds reviewed.  Past Medical History:  Past Medical History  Diagnosis Date  . Atrial fibrillation   . Chronic diastolic heart failure   . DVT   . HYPERLIPIDEMIA   . HYPERTENSION, BENIGN   . ALLERGIC ASTHMA   . Palpitations     Past Surgical History:  Past Surgical History  Procedure Laterality Date  . Bone marrow biopsy      Family History:  Family History  Problem Relation Age of Onset  . Stroke Mother   . Heart attack Maternal Grandmother     Social History:  reports that he has quit smoking. He does not have any smokeless tobacco history on file. His alcohol and drug histories are not on file.  Allergies: No Known Allergies  Medications:   Medication List    ASK your doctor about these medications       bimatoprost 0.01 % Soln  Commonly known as:  LUMIGAN  Place 1 drop into both eyes every morning.     CALCIUM + D3 PO  Take 1 tablet by mouth daily.     Coenzyme Q10 300 MG Caps  Take 300 mg by mouth every evening.     cyanocobalamin 1000 MCG/ML injection  Commonly known as:  (VITAMIN B-12)  Inject 1,000 mcg into the muscle every 30 (thirty) days.     diclofenac 75 MG EC tablet  Commonly known as:  VOLTAREN  Take 75 mg by mouth daily.     Dutasteride-Tamsulosin HCl 0.5-0.4 MG Caps  Take 1 capsule by mouth daily.     furosemide 20 MG tablet  Commonly known as:  LASIX  Take 20 mg by mouth 2 (two) times a week. On Tuesday and Thursday.     levofloxacin 750 MG tablet  Commonly known as:  LEVAQUIN  Take 750 mg by mouth daily. For 10 days.     Magnesium 250  MG Tabs  Take 1 tablet by mouth every evening.     metoprolol tartrate 25 MG tablet  Commonly known as:  LOPRESSOR  Take 25 mg by mouth 2 (two) times daily.     montelukast 10 MG tablet  Commonly known as:  SINGULAIR  Take 10 mg by mouth at bedtime.     multivitamin tablet  Take 1 tablet by mouth daily.     omeprazole 20 MG capsule  Commonly known as:  PRILOSEC  Take 20 mg by mouth daily.     potassium chloride SA 20 MEQ tablet  Commonly known as:  K-DUR,KLOR-CON  Take 20 mEq by mouth 2 (two) times a week. On Tuesday and Thursday.     quinapril 20 MG tablet  Commonly known as:  ACCUPRIL  Take 20 mg by mouth daily.     traMADol 50 MG tablet  Commonly known as:  ULTRAM  Take 50 mg by mouth 2 (two) times daily at 10 am and 4 pm.     vitamin C 500 MG tablet  Commonly known as:  ASCORBIC ACID  Take 500 mg by mouth daily.     VYTORIN 10-20 MG per tablet  Generic drug:  ezetimibe-simvastatin  Take 1 tablet by mouth at bedtime.     warfarin 2 MG tablet  Commonly known as:  COUMADIN  Take 2 mg by mouth daily.        Please HPI for pertinent positives, otherwise complete 10 system ROS negative.  Physical Exam: BP 120/72  Pulse 81  Temp(Src) 97.5 F (36.4 C) (Oral)  Resp 18  Ht 5' 10.5" (1.791 m)  Wt 191 lb 6.4 oz (86.818 kg)  BMI 27.07 kg/m2  SpO2 95% Body mass index is 27.07 kg/(m^2).   General Appearance:  Alert, cooperative, no distress, appears stated age  Head:  Normocephalic, without obvious abnormality, atraumatic  ENT: Unremarkable  Neck: Supple, symmetrical, trachea midline  Lungs:   Clear to auscultation bilaterally, no w/r/r  Chest Wall:  No tenderness or deformity  Heart:  Regular rate and rhythm, S1, S2 normal, no murmur, rub or gallop.  Abdomen:   Soft, non-tender, non distended.  Neurologic: Normal affect, no gross deficits.   LABS: PROTIME-INR     Status: Abnormal   Collection Time    12/24/13  4:15 AM      Result Value Ref Range    Prothrombin Time 17.4 (*) 11.6 - 15.2 seconds   INR 1.47  0.00 - 6.96  BASIC METABOLIC PANEL     Status: Abnormal   Collection Time    12/24/13  4:15 AM      Result Value Ref Range   Sodium 134 (*) 137 - 147 mEq/L   Potassium 4.2  3.7 - 5.3 mEq/L   Chloride 105  96 - 112 mEq/L   CO2 19  19 - 32 mEq/L   Glucose, Bld 157 (*) 70 - 99 mg/dL   BUN 42 (*) 6 - 23 mg/dL   Creatinine, Ser 1.56 (*) 0.50 - 1.35 mg/dL   Calcium 8.7  8.4 - 10.5 mg/dL   GFR calc non Af Amer 42 (*) >90 mL/min   GFR calc Af Amer 49 (*) >90 mL/min   Comment: (NOTE)     The eGFR has been calculated using the CKD EPI equation.     This calculation has not been validated in all clinical situations.     eGFR's persistently <90 mL/min signify possible Chronic Kidney     Disease.  CBC     Status: Abnormal   Collection Time    12/24/13  4:15 AM      Result Value Ref Range   WBC 24.2 (*) 4.0 - 10.5 K/uL   RBC 2.57 (*) 4.22 - 5.81 MIL/uL   Hemoglobin 7.8 (*) 13.0 - 17.0 g/dL   HCT 22.6 (*) 39.0 - 52.0 %   MCV 87.9  78.0 - 100.0 fL   MCH 30.4  26.0 - 34.0 pg   MCHC 34.5  30.0 - 36.0 g/dL   RDW 13.0  11.5 - 15.5 %   Platelets 431 (*) 150 - 400 K/uL  HEPARIN LEVEL (UNFRACTIONATED)     Status: Abnormal   Collection Time    12/24/13  4:15 AM      Result Value Ref Range   Heparin Unfractionated 0.12 (*) 0.30 - 0.70 IU/mL   Comment:            IF HEPARIN RESULTS ARE BELOW     EXPECTED VALUES, AND PATIENT     DOSAGE HAS BEEN CONFIRMED,     SUGGEST FOLLOW UP TESTING     OF ANTITHROMBIN III LEVELS.   No results found.  Assessment/Plan  Acute renal insufficiency, hematuria For US guided random renal biopsy. Afib-Coumadin has been held, INR 1.47 Heparin gtt-will stop at about 0500 tomorrow for anticipated biopsy at ~0900 Discussed procedure, risks, complications, use of sedation with pt and wife. Consent signed in chart  Ascencion Dike PA-C 12/24/2013, 10:37 AM

## 2013-12-24 NOTE — Consult Note (Signed)
Agree.  Will hold IV heparin beginning at 5 AM tomorrow and plan on random renal core biopsy in AM tomorrow.

## 2013-12-24 NOTE — Progress Notes (Signed)
ANTICOAGULATION CONSULT NOTE - Follow up  Pharmacy Consult for IV heparin Indication: atrial fibrillation  No Known Allergies  Patient Measurements: Height: 5' 10.5" (179.1 cm) Weight: 187 lb 3.2 oz (84.913 kg) IBW/kg (Calculated) : 74.15 Heparin wt = 85kg  Vital Signs: Temp: 97.6 F (36.4 C) (03/28 2100) BP: 125/63 mmHg (03/28 2100)  Labs:  Recent Labs  12/22/13 0338  12/23/13 0603 12/23/13 0820 12/23/13 1838 12/24/13 0415  HGB 8.3*  --  8.3*  --   --  7.8*  HCT 24.9*  --  25.3*  --   --  22.6*  PLT 432*  --  375  --   --  431*  LABPROT 17.8*  --  17.8*  --   --  17.4*  INR 1.51*  --  1.51*  --   --  1.47  HEPARINUNFRC  --   < > <0.10* 0.20* <0.10* 0.12*  CREATININE 1.56*  --  1.56*  --   --  1.56*  < > = values in this interval not displayed.  Estimated Creatinine Clearance: 44.3 ml/min (by C-G formula based on Cr of 1.56).  Assessment: 73 yo M admitted 3/23 with CAP. Patient was started on levaquin outpatient on 3/19 and subsequently admitted by PCP for continued SOB x 2 weeks and bilateral infiltrates on CXR. 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 Per Renal MD, patient now with AKI and may need renal biopsy so warfarin was placed on hold. Pharmacy has now been consulted to dose heparin bridge while INR subtherapeutic.  Last dose warfarin given inpatient on 3/26- on hold now as above  INR today remains subtherapeutic at 1.47  Heparin level = 0.12 on a rate of 1700 units/hr; subtherapeutic    No reported bleeding or infusion line issues    SCr remains elevated a 1.47, CrCl 44 ml/hr  H/H down slightly (hgb = 7.8 this AM)  Note plan for renal biopsy on 3/30.  Goal of Therapy:  Heparin level 0.3-0.7 units/ml Monitor platelets by anticoagulation protocol: Yes   Plan:  1) Increase heparin gtt to 1950 units/hr  2) 8 hr heparin level after heparin started 3) Daily heparin  level and CBC  Leone Haven, PharmD  12/24/2013 5:22 AM

## 2013-12-25 ENCOUNTER — Inpatient Hospital Stay (HOSPITAL_COMMUNITY): Payer: Medicare Other

## 2013-12-25 LAB — UIFE/LIGHT CHAINS/TP QN, 24-HR UR
ALBUMIN, U: DETECTED
Alpha 1, Urine: DETECTED — AB
Alpha 2, Urine: DETECTED — AB
BETA UR: DETECTED — AB
FREE KAPPA/LAMBDA RATIO: 14.96 ratio — AB (ref 2.04–10.37)
Free Kappa Lt Chains,Ur: 35 mg/dL — ABNORMAL HIGH (ref 0.14–2.42)
Free Lambda Lt Chains,Ur: 2.34 mg/dL — ABNORMAL HIGH (ref 0.02–0.67)
Gamma Globulin, Urine: DETECTED — AB
TOTAL PROTEIN, URINE-UPE24: 42.6 mg/dL

## 2013-12-25 LAB — PROTEIN ELECTROPHORESIS, SERUM
ALBUMIN ELP: 42.8 % — AB (ref 55.8–66.1)
ALPHA-1-GLOBULIN: 12 % — AB (ref 2.9–4.9)
ALPHA-2-GLOBULIN: 9.8 % (ref 7.1–11.8)
BETA 2: 8.6 % — AB (ref 3.2–6.5)
Beta Globulin: 7.5 % — ABNORMAL HIGH (ref 4.7–7.2)
GAMMA GLOBULIN: 19.3 % — AB (ref 11.1–18.8)
M-Spike, %: NOT DETECTED g/dL
Total Protein ELP: 6.3 g/dL (ref 6.0–8.3)

## 2013-12-25 LAB — TYPE AND SCREEN
ABO/RH(D): A NEG
Antibody Screen: NEGATIVE

## 2013-12-25 LAB — BASIC METABOLIC PANEL
BUN: 43 mg/dL — AB (ref 6–23)
CALCIUM: 9.2 mg/dL (ref 8.4–10.5)
CO2: 20 meq/L (ref 19–32)
Chloride: 106 mEq/L (ref 96–112)
Creatinine, Ser: 1.68 mg/dL — ABNORMAL HIGH (ref 0.50–1.35)
GFR calc Af Amer: 45 mL/min — ABNORMAL LOW (ref >90)
GFR calc non Af Amer: 39 mL/min — ABNORMAL LOW (ref >90)
GLUCOSE: 148 mg/dL — AB (ref 70–99)
Potassium: 4.4 mEq/L (ref 3.7–5.3)
SODIUM: 138 meq/L (ref 137–147)

## 2013-12-25 LAB — ANCA SCREEN W REFLEX TITER
Atypical p-ANCA Screen: NEGATIVE
P-ANCA SCREEN: POSITIVE — AB
c-ANCA Screen: NEGATIVE

## 2013-12-25 LAB — CBC
HCT: 24.1 % — ABNORMAL LOW (ref 39.0–52.0)
Hemoglobin: 7.7 g/dL — ABNORMAL LOW (ref 13.0–17.0)
MCH: 28.6 pg (ref 26.0–34.0)
MCHC: 32 g/dL (ref 30.0–36.0)
MCV: 89.6 fL (ref 78.0–100.0)
PLATELETS: 485 10*3/uL — AB (ref 150–400)
RBC: 2.69 MIL/uL — AB (ref 4.22–5.81)
RDW: 13.5 % (ref 11.5–15.5)
WBC: 22.4 10*3/uL — ABNORMAL HIGH (ref 4.0–10.5)

## 2013-12-25 LAB — PROTIME-INR
INR: 1.57 — ABNORMAL HIGH (ref 0.00–1.49)
INR: 1.42 (ref 0.00–1.49)
Prothrombin Time: 18.3 seconds — ABNORMAL HIGH (ref 11.6–15.2)
Prothrombin Time: 17 seconds — ABNORMAL HIGH (ref 11.6–15.2)

## 2013-12-25 LAB — ANCA TITERS: P-ANCA: 1:160 {titer} — ABNORMAL HIGH

## 2013-12-25 MED ORDER — FENTANYL CITRATE 0.05 MG/ML IJ SOLN
INTRAMUSCULAR | Status: AC | PRN
  Administered 2013-12-25: 100 ug via INTRAVENOUS

## 2013-12-25 MED ORDER — MIDAZOLAM HCL 2 MG/2ML IJ SOLN
INTRAMUSCULAR | Status: AC
  Filled 2013-12-25: qty 2

## 2013-12-25 MED ORDER — MIDAZOLAM HCL 2 MG/2ML IJ SOLN
INTRAMUSCULAR | Status: AC | PRN
  Administered 2013-12-25: 2 mg via INTRAVENOUS

## 2013-12-25 MED ORDER — FENTANYL CITRATE 0.05 MG/ML IJ SOLN
INTRAMUSCULAR | Status: AC
  Filled 2013-12-25: qty 2

## 2013-12-25 MED ORDER — LATANOPROST 0.005 % OP SOLN: 1.0000 [drp] | Freq: Every day | OPHTHALMIC | Status: DC

## 2013-12-25 NOTE — Progress Notes (Addendum)
[facility] PROGRESS NOTE  Bryan Hubbard CLE:[id number] DOB: 12-03-1940 DOA: 12/18/2013 PCP: [name], MD  Assessment/Plan  Acute hypoxic respiratory failure with bilateral pulmonary infiltrates on CXR.  Previously treated as outpatient, but symptoms worsened.  Legionella/S. pneumo from clinic were both negative.  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 Completed 7 days of treatment for community-acquired pneumonia. Wean oxygen as tolerated.  Glomerulonephritis, active sediment.  MPO +, anti-GBM +, ANA -, ESR 140.  Creatinine rose slightly today.   -  Appreciate nephrology assistance -  FENA 0.89 -  RUS:  wnl -  Continue IVF -  Continue to hold nephrotoxins such as diclofenac  -  Cyclophosphamide started 3/27 -  Completed 3 days of Methylprednisolone -  Start prednisone 60 mg once daily -  Appreciate IR assistance:  Renal biopsy today -  Hepatitis B and C. Negative, HIV negative  -  PPD to be read tomorrow morning  Normocytic anemia due to inflammation, hgb trending down slightly -  Iron studies consistent with inflammation, very elevated ferritin -  B12 wnl -  Folate wnl -  Occult stool negative -  SPEP pending but UPEP/IFE negative   Aflutter - presently rate controlled. Cont beta blocker.  -  Hold warfarin in case patient needs renal biopsy -  Heparin gtt on hold -  Giving 1 dose of FFP now and repeat INR  Diastolic heart failure, chronic, pro-BNP elevated -  Daily weights (stable) -  Strict I/O  Pleuritic right chest pain, likely related to pleuritis from pneumonia vs. Less likely pericarditis.  PE less likely as patient was therapeutic on warfarin at time that this occurred.  Pain only occurrs with deep inspiration and movement.   -  Telemetry demonstrated chronic a-flutter, rate controlled -  No evidence of ischemia on ECG -  Continue tramadol prn -  ECHO:  Ejection fraction of 55%, mild LVH, mild to moderate mitral regurgitation, right ventricle mildly to moderately  dilated with mild to moderate reduced systolic function and peak PA pressure of 45 mm mercury.   -  Would recommend repeating echocardiogram to assess right ventricular function and peak PA pressure after acute illness is resolved.  BPH, continue avodart/flomax -  [place]:  No obstruction  Leukocytosis, rose with initiation of steroids and now trending down. Thrombocytosis, rising, acute phase reactant Hyponatremia, resolved with IVF.    Diet:  Healthy heart Access:  PIV IVF:  yes Proph:  Heparin + PPI (steroids + a/c)  Code Status: full Family Communication: patient alone Disposition Plan:   Receiving 3 days of IV steroids and awaiting renal biopsy   Consultants:  Nephrology  Procedures:  CXR  Antibiotics:  Azithromycin 12/18/13>>> x 3 days, 1.5gm total Rocephin 12/18/13 >> 3/29   HPI/Subjective:  States his breathing is good.  Still making copious clear yellow urine.  Awaiting biopsy.     Objective: Filed Vitals:   12/24/13 0555 12/24/13 1422 12/24/13 2025 12/25/13 0637  BP:  111/63 128/79 116/78  Pulse:  90 74 95  Temp:  97.6 F (36.4 C) 97.5 F (36.4 C) 97.5 F (36.4 C)  TempSrc:  Oral Oral Oral  Resp:  $Remo'18 20 20  'qhWVZ$ Height:      Weight: 86.818 kg (191 lb 6.4 oz)   88.905 kg (196 lb)  SpO2:  98% 96% 96%    Intake/Output Summary (Last 24 hours) at 12/25/13 0819 Last data filed at 12/25/13 0700  Gross per 24 hour  Intake 2279.25 ml  Output   2175 ml  Net 104.25 ml   Filed Weights   12/24/13 0549 12/24/13 0555 12/25/13 0637  Weight: 87.227 kg (192 lb 4.8 oz) 86.818 kg (191 lb 6.4 oz) 88.905 kg (196 lb)    Exam:   General:  CM, No acute distress  HEENT:  NCAT, MMM, rosy cheeks  Cardiovascular:  RRR, nl S1, S2 no mrg, 2+ pulses, warm extremities  Respiratory:  Few rare rales right base, otherwise clear, no rhonchi or wheeze, no increased WOB  Abdomen:   NABS, soft, NT/ND  MSK:   Normal tone and bulk, 1+ LEE  Neuro:  Grossly intact  Data  Reviewed: Basic Metabolic Panel:  Recent Labs Lab 12/21/13 0335 12/22/13 0338 12/23/13 0603 12/24/13 0415 12/25/13 0330  NA 130* 133* 134* 134* 138  K 4.5 4.6 4.8 4.2 4.4  CL 98 99 102 105 106  CO2 $Re'21 21 20 19 20  'ABl$ GLUCOSE 95 106* 174* 157* 148*  BUN 34* 32* 36* 42* 43*  CREATININE 1.58* 1.56* 1.56* 1.56* 1.68*  CALCIUM 8.7 9.0 9.3 8.7 9.2   Liver Function Tests:  Recent Labs Lab 12/18/13 1635  AST 63*  ALT 70*  ALKPHOS 104  BILITOT 1.2  PROT 7.8  ALBUMIN 3.0*   No results found for this basename: LIPASE, AMYLASE,  in the last 168 hours No results found for this basename: AMMONIA,  in the last 168 hours CBC:  Recent Labs Lab 12/21/13 0335 12/22/13 0338 12/23/13 0603 12/24/13 0415 12/25/13 0330  WBC 12.5* 13.0* 10.5 24.2* 22.4*  HGB 7.7* 8.3* 8.3* 7.8* 7.7*  HCT 23.6* 24.9* 25.3* 22.6* 24.1*  MCV 89.4 88.0 88.8 87.9 89.6  PLT 438* 432* 375 431* 485*   Cardiac Enzymes: No results found for this basename: CKTOTAL, CKMB, CKMBINDEX, TROPONINI,  in the last 168 hours BNP (last 3 results)  Recent Labs  12/18/13 1635  PROBNP 5296.0*   CBG: No results found for this basename: GLUCAP,  in the last 168 hours  No results found for this or any previous visit (from the past 240 hour(s)).   Studies: No results found.  Scheduled Meds: . Co-Enzyme Q10  300 mg Oral QHS  . Cyclophosphamide  150 mg Oral Daily  . dutasteride  0.5 mg Oral Daily   And  . tamsulosin  0.4 mg Oral Daily  . ezetimibe-simvastatin  1 tablet Oral QHS  . fentaNYL      . fentaNYL      . latanoprost  1 drop Both Eyes QHS  . magnesium oxide  200 mg Oral QHS  . metoprolol tartrate  25 mg Oral BID  . midazolam      . montelukast  10 mg Oral QHS  . pantoprazole  40 mg Oral Daily  . predniSONE  60 mg Oral Q breakfast  . sodium chloride  3 mL Intravenous Q12H  . traMADol  50 mg Oral BID  . tuberculin  5 Units Intradermal Once   Continuous Infusions: . sodium chloride 75 mL/hr at 12/25/13  0112  . heparin Stopped (12/25/13 0500)    Principal Problem:   CAP (community acquired pneumonia) Active Problems:   HYPERTENSION, BENIGN   Atrial fibrillation   DVT   Atrial flutter   Leukocytosis   Community acquired pneumonia   AKI (acute kidney injury)   Glomerulonephritis   Anemia of chronic disease   Thrombocytosis    Time spent: 30 min    Katyra Tomassetti, [facility] Pager [phone]. If 7PM-7AM, please contact night-coverage at [email or web address],  password [facility] 12/25/2013, 8:19 AM  LOS: 7 days

## 2013-12-25 NOTE — Procedures (Signed)
Technically successful US guided random right renal biopsy. No immediate complications.

## 2013-12-25 NOTE — Progress Notes (Signed)
INR rebounded back up to 1.57 this am. D/w primary team and pt/wife. Will give 1 unit FFP this morning and recheck INR after. If <1.5, will proceed with biopsy today. Keep NPO and keep heparin off.  Ascencion Dike PA-C Interventional Radiology 12/25/2013 8:57 AM

## 2013-12-25 NOTE — Progress Notes (Signed)
Patient ID: Bryan Hubbard, male   DOB: 1940/12/16, 73 y.o.   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 Status post intravenous Solu-Medrol with significant clinical improvement and able to come off oxygen. Started on cyclophosphamide and awaiting renal biopsy today. To transition to oral corticosteroids today. 2 Anemia : Possibly anemia of critical illness plus/minus alveolar hemorrhage without hemoptysis. Low iron stores but elevated ferritin is prohibitive to intravenous iron therapy. Will give Aranesp 3 HTN- blood pressures well controlled at this time, no volume excess  4 Leukocytosis- demargination due to IV steroids   Subjective:   Reports to be feeling well and somewhat anxious about his renal biopsy    Objective:   BP 121/75  Pulse 84  Temp(Src) 97.7 F (36.5 C) (Oral)  Resp 16  Ht 5' 10.5" (1.791 m)  Wt 88.905 kg (196 lb)  BMI 27.72 kg/m2  SpO2 95%  Intake/Output Summary (Last 24 hours) at 12/25/13 1324 Last data filed at 12/25/13 1045  Gross per 24 hour  Intake 2039.25 ml  Output   1975 ml  Net  64.25 ml   Weight change: 1.678 kg (3 lb 11.2 oz)  Physical Exam: Gen: Comfortably resting in bed, wife by bedside CVS: Pulse regular in rate and rhythm, S1 and S2 normal Resp: Clear to auscultation bilaterally, no rales/rhonchi Abd: Soft, flat, nontender and bowel sounds are normal Ext: No lower extremity edema, no suspicious rash  Imaging: No results found.  Labs: BMET  Recent Labs Lab 12/19/13 0348 12/20/13 0333 12/21/13 0335 12/22/13 0338 12/23/13 0603 12/24/13 0415 12/25/13 0330  NA 135* 131* 130* 133* 134* 134* 138  K 4.0 4.9 4.5 4.6 4.8 4.2 4.4  CL 99 98 98 99 102 105 106  CO2 22 21 21 21 20 19 20   GLUCOSE 95 104* 95 106* 174* 157* 148*  BUN 35* 37* 34* 32*  36* 42* 43*  CREATININE 1.66* 1.57* 1.58* 1.56* 1.56* 1.56* 1.68*  CALCIUM 9.1 8.7 8.7 9.0 9.3 8.7 9.2   CBC  Recent Labs Lab 12/22/13 0338 12/23/13 0603 12/24/13 0415 12/25/13 0330  WBC 13.0* 10.5 24.2* 22.4*  HGB 8.3* 8.3* 7.8* 7.7*  HCT 24.9* 25.3* 22.6* 24.1*  MCV 88.0 88.8 87.9 89.6  PLT 432* 375 431* 485*   Medications:    . Co-Enzyme Q10  300 mg Oral QHS  . Cyclophosphamide  150 mg Oral Daily  . dutasteride  0.5 mg Oral Daily   And  . tamsulosin  0.4 mg Oral Daily  . ezetimibe-simvastatin  1 tablet Oral QHS  . latanoprost  1 drop Both Eyes QHS  . magnesium oxide  200 mg Oral QHS  . metoprolol tartrate  25 mg Oral BID  . montelukast  10 mg Oral QHS  . pantoprazole  40 mg Oral Daily  . predniSONE  60 mg Oral Q breakfast  . sodium chloride  3 mL Intravenous Q12H  . traMADol  50 mg Oral BID  . tuberculin  5 Units Intradermal Once   Elmarie Shiley, MD 12/25/2013, 1:24 PM

## 2013-12-25 NOTE — Progress Notes (Signed)
ANTICOAGULATION CONSULT NOTE - Follow up  Pharmacy Consult for IV heparin Indication: atrial fibrillation  No Known Allergies  Patient Measurements: Height: 5' 10.5" (179.1 cm) Weight: 191 lb 6.4 oz (86.818 kg) IBW/kg (Calculated) : 74.15 Heparin wt = 85kg  Vital Signs: Temp: 97.5 F (36.4 C) (03/29 2025) Temp src: Oral (03/29 2025) BP: 128/79 mmHg (03/29 2025) Pulse Rate: 74 (03/29 2025)  Labs:  Recent Labs  12/22/13 0338 12/23/13 0603  12/24/13 0415 12/24/13 1426 12/24/13 2335  HGB 8.3* 8.3*  --  7.8*  --   --   HCT 24.9* 25.3*  --  22.6*  --   --   PLT 432* 375  --  431*  --   --   LABPROT 17.8* 17.8*  --  17.4*  --   --   INR 1.51* 1.51*  --  1.47  --   --   HEPARINUNFRC  --  <0.10*  < > 0.12* 0.25* 0.33  CREATININE 1.56* 1.56*  --  1.56*  --   --   < > = values in this interval not displayed.  Estimated Creatinine Clearance: 44.3 ml/min (by C-G formula based on Cr of 1.56).  Assessment: 73 yo M admitted 3/23 with CAP. Patient was started on levaquin outpatient on 3/19 and subsequently admitted by PCP for continued SOB x 2 weeks and bilateral infiltrates on CXR. 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 Per Renal MD, patient now with AKI and may need renal biopsy so warfarin was placed on hold. Pharmacy has now been consulted to dose heparin bridge while INR subtherapeutic.  Last dose warfarin given inpatient on 3/26- on hold now as above  INR today remains subtherapeutic at 1.47  Heparin level = 0.33 on a rate of 2100 units/hr; therapeutic    No reported bleeding or infusion line issues    Note plan for renal biopsy on 3/30.  Goal of Therapy:  Heparin level 0.3-0.7 units/ml Monitor platelets by anticoagulation protocol: Yes   Plan:  1) Continue heparin gtt to 2100 units/hr  2) Recheck 8 hr heparin level to confirm therapeutic dose 3) Daily heparin level and  CBC  Leone Haven, PharmD  12/25/2013 12:23 AM

## 2013-12-25 NOTE — Progress Notes (Signed)
ANTICOAGULATION CONSULT NOTE - Follow up  Pharmacy Consult for IV heparin Indication: atrial fibrillation  No Known Allergies  Patient Measurements: Height: 5' 10.5" (179.1 cm) Weight: 196 lb (88.905 kg) IBW/kg (Calculated) : 74.15 Heparin wt = 85kg  Vital Signs: Temp: 97.7 F (36.5 C) (03/30 1210) Temp src: Oral (03/30 1210) BP: 121/75 mmHg (03/30 1210) Pulse Rate: 84 (03/30 1210)  Labs:  Recent Labs  12/23/13 0603  12/24/13 0415 12/24/13 1426 12/24/13 2335 12/25/13 0330 12/25/13 1304  HGB 8.3*  --  7.8*  --   --  7.7*  --   HCT 25.3*  --  22.6*  --   --  24.1*  --   PLT 375  --  431*  --   --  485*  --   LABPROT 17.8*  --  17.4*  --   --  18.3* 17.0*  INR 1.51*  --  1.47  --   --  1.57* 1.42  HEPARINUNFRC <0.10*  < > 0.12* 0.25* 0.33  --   --   CREATININE 1.56*  --  1.56*  --   --  1.68*  --   < > = values in this interval not displayed.  Estimated Creatinine Clearance: 41.1 ml/min (by C-G formula based on Cr of 1.68).  Assessment: 73 yo M admitted 3/23 with CAP. Patient was started on levaquin outpatient on 3/19 and subsequently admitted by PCP for continued SOB x 2 weeks and bilateral infiltrates on CXR. 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 Per Renal MD, patient now with AKI and may need renal biopsy so warfarin was placed on hold. Pharmacy has now been consulted to dose heparin bridge while INR subtherapeutic.  Last dose warfarin given inpatient on 3/26- on hold now as above  Heparin on hold since 0500 this AM in preparation of renal biopsy.  INR bumped back up this AM to 1.57.  IR wants INR<1.5 before proceeding to biopsy.  1 unit FFP given, INR repeated this afternoon 1.47.  Heparin remains on hold.  Goal of Therapy:  Heparin level 0.3-0.7 units/ml Monitor platelets by anticoagulation protocol: Yes   Plan:  F/u plan to resume anticoagulation following renal biopsy  today.  Heparin drip remains on hold since 0500 this AM.  Warfarin on hold since admission.  Hershal Coria, PharmD, BCPS Pager: [phone] 12/25/2013 1:37 PM

## 2013-12-26 DIAGNOSIS — I7782 Antineutrophilic cytoplasmic antibody (ANCA) vasculitis: Secondary | ICD-10-CM

## 2013-12-26 DIAGNOSIS — I776 Arteritis, unspecified: Secondary | ICD-10-CM

## 2013-12-26 LAB — PREPARE FRESH FROZEN PLASMA: UNIT DIVISION: 0

## 2013-12-26 LAB — BASIC METABOLIC PANEL
BUN: 47 mg/dL — ABNORMAL HIGH (ref 6–23)
CO2: 22 mEq/L (ref 19–32)
Calcium: 8.9 mg/dL (ref 8.4–10.5)
Chloride: 109 mEq/L (ref 96–112)
Creatinine, Ser: 1.52 mg/dL — ABNORMAL HIGH (ref 0.50–1.35)
GFR, EST AFRICAN AMERICAN: 51 mL/min — AB (ref >90)
GFR, EST NON AFRICAN AMERICAN: 44 mL/min — AB (ref >90)
Glucose, Bld: 105 mg/dL — ABNORMAL HIGH (ref 70–99)
Potassium: 4.6 mEq/L (ref 3.7–5.3)
SODIUM: 140 meq/L (ref 137–147)

## 2013-12-26 LAB — CBC
HCT: 23 % — ABNORMAL LOW (ref 39.0–52.0)
HEMOGLOBIN: 7.7 g/dL — AB (ref 13.0–17.0)
MCH: 29.6 pg (ref 26.0–34.0)
MCHC: 33.5 g/dL (ref 30.0–36.0)
MCV: 88.5 fL (ref 78.0–100.0)
Platelets: 461 10*3/uL — ABNORMAL HIGH (ref 150–400)
RBC: 2.6 MIL/uL — AB (ref 4.22–5.81)
RDW: 13.7 % (ref 11.5–15.5)
WBC: 16.8 10*3/uL — AB (ref 4.0–10.5)

## 2013-12-26 LAB — PROTIME-INR
INR: 1.48 (ref 0.00–1.49)
PROTHROMBIN TIME: 17.5 s — AB (ref 11.6–15.2)

## 2013-12-26 LAB — HEPARIN LEVEL (UNFRACTIONATED)

## 2013-12-26 MED ORDER — PREDNISONE 20 MG PO TABS: 60.0000 mg | ORAL_TABLET | Freq: Every day | ORAL | Status: DC

## 2013-12-26 MED ORDER — CYCLOPHOSPHAMIDE 25 MG PO CAPS: 150.0000 mg | ORAL_CAPSULE | Freq: Every day | ORAL | Status: DC

## 2013-12-26 MED ORDER — ENOXAPARIN SODIUM 150 MG/ML ~~LOC~~ SOLN: 1.5000 mg/kg | SUBCUTANEOUS | Status: DC

## 2013-12-26 MED ORDER — WARFARIN - PHARMACIST DOSING INPATIENT: Freq: Every day | Status: DC

## 2013-12-26 MED ORDER — PHYTONADIONE 5 MG PO TABS
2.5000 mg | ORAL_TABLET | Freq: Once | ORAL | Status: AC
  Administered 2013-12-26: 2.5 mg via ORAL
  Filled 2013-12-26: qty 1

## 2013-12-26 MED ORDER — FUROSEMIDE 10 MG/ML IJ SOLN
40.0000 mg | Freq: Once | INTRAMUSCULAR | Status: AC
  Administered 2013-12-26: 40 mg via INTRAVENOUS
  Filled 2013-12-26: qty 4

## 2013-12-26 MED ORDER — FUROSEMIDE 20 MG PO TABS: 20.0000 mg | ORAL_TABLET | Freq: Every day | ORAL | Status: DC

## 2013-12-26 MED ORDER — ENOXAPARIN (LOVENOX) PATIENT EDUCATION KIT
PACK | Freq: Once | Status: AC
  Administered 2013-12-26: 12:00:00
  Filled 2013-12-26: qty 1

## 2013-12-26 MED ORDER — WARFARIN SODIUM 3 MG PO TABS
3.0000 mg | ORAL_TABLET | Freq: Once | ORAL | Status: AC
  Administered 2013-12-26: 3 mg via ORAL
  Filled 2013-12-26: qty 1

## 2013-12-26 MED ORDER — ENOXAPARIN SODIUM 150 MG/ML ~~LOC~~ SOLN
1.5000 mg/kg | SUBCUTANEOUS | Status: DC
  Administered 2013-12-26: 135 mg via SUBCUTANEOUS
  Filled 2013-12-26: qty 1

## 2013-12-26 NOTE — Progress Notes (Addendum)
ANTICOAGULATION CONSULT NOTE - Follow up  Pharmacy Consult for warfarin and start Lovenox bridge Indication: atrial fibrillation  No Known Allergies  Patient Measurements: Height: 5' 10.5" (179.1 cm) Weight: 198 lb 4.8 oz (89.948 kg) IBW/kg (Calculated) : 74.15  Vital Signs: Temp: 97.4 F (36.3 C) (03/31 0602) Temp src: Oral (03/31 0602) BP: 117/71 mmHg (03/31 0602) Pulse Rate: 75 (03/31 0602)  Labs:  Recent Labs  12/24/13 0415 12/24/13 1426 12/24/13 2335 12/25/13 0330 12/25/13 1304 12/26/13 0428  HGB 7.8*  --   --  7.7*  --  7.7*  HCT 22.6*  --   --  24.1*  --  23.0*  PLT 431*  --   --  485*  --  461*  LABPROT 17.4*  --   --  18.3* 17.0* 17.5*  INR 1.47  --   --  1.57* 1.42 1.48  HEPARINUNFRC 0.12* 0.25* 0.33  --   --  <0.10*  CREATININE 1.56*  --   --  1.68*  --  1.52*    Estimated Creatinine Clearance: 49.3 ml/min (by C-G formula based on Cr of 1.52).  Assessment: 16 yoM on chronic warfarin for h/o DVT and afib was admitted 3/23 with CAP.  Patient developed AKI and nephrology ordered for renal biopsy so warfarin was placed on hold and patient was bridged with IV heparin until biopsy could be performed (waited for INR<1.5).  Renal biopsy completed 3/30 afternoon.  Per MD, no anticoagulation 3/30 and resume warfarin on 3/31.    Last dose warfarin given inpatient on 3/26.  INR 1.48 today.  Warfarin dose PTA = 2 mg daily  Hgb low but stable.  No bleeding/complications reported.  Goal of Therapy:  INR 2-3 Monitor platelets by anticoagulation protocol: Yes   Plan:  1.  Resume warfarin today with 3 mg.  Give today at 1200. 2.  Daily INR.  Hershal Coria, PharmD, BCPS Pager: [phone] 12/26/2013 7:38 AM   ADDENDUM: 12/26/2013 11:22 AM MD wants to bridge patient with Lovenox while INR subtherapeutic post biospy performed yesterday afternoon. Weight 90 kg CrCl~54 ml/min  Plan: 1.  Lovenox 135 mg (1.5 mg/kg) SQ q24h starting at noon today.  Continue until  INR >/= 2.0. 2.  Check CBC q72h while on Lovenox.  F/u SCr. 3.  Dispense Lovenox teaching kit.  Hershal Coria, PharmD, BCPS Pager: [phone] 12/26/2013 11:24 AM

## 2013-12-26 NOTE — Progress Notes (Signed)
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   -  No discharge today.   -  Plan for HD catheter tomorrow to start plasmapheresis.   -  Hold a/c -  INR in AM -  Give 2.5mg  vit K today  -  May need FFP prior to procedure -  May go home post-procedure and follow up for outpatient plasmapheresis

## 2013-12-26 NOTE — Progress Notes (Signed)
Patient ID: Bryan Hubbard, male   DOB: [birth year], 73 y.o.   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 Status post intravenous Solu-Medrol with significant clinical improvement and able to come off oxygen. Started on cyclophosphamide and awaiting renal biopsy results. Transitioned to oral corticosteroids and will start taper in 2 weeks. Advised on lasix 20mg  daily for 7 days for pedal edema- he previously was on 20mg  twice a week.  2 Anemia : Possibly anemia of critical illness plus/minus alveolar hemorrhage without hemoptysis. Low iron stores but elevated ferritin is prohibitive to intravenous iron therapy. Will give Aranesp  3 HTN- blood pressures well controlled at this time, no volume excess  4 Leukocytosis- demargination due to IV steroids   Subjective:   Reports some shortness of breath and abdominal distension this AM- had some hematuria overnight that has now cleared up.   Objective:   BP 117/71  Pulse 75  Temp(Src) 97.4 F (36.3 C) (Oral)  Resp 16  Ht 5' 10.5" (1.791 m)  Wt 89.948 kg (198 lb 4.8 oz)  BMI 28.04 kg/m2  SpO2 98%  Intake/Output Summary (Last 24 hours) at 12/26/13 1304 Last data filed at 12/26/13 0900  Gross per 24 hour  Intake   1965 ml  Output   1525 ml  Net    440 ml   Weight change: 1.043 kg (2 lb 4.8 oz)  Physical Exam: HQP:[id number] resting in bed DJT:TSVXB RRR, normal S1 and S2 Resp:Fine rales bibasally, no rhonchi LTJ:QZES, flat, NT, BS normal Ext:1+ bipedal edema  Imaging: US Biopsy  12/25/2013   INDICATION: Renal insufficiency, concern for glomerulonephritis  EXAM: ULTRASOUND GUIDED RENAL BIOPSY  COMPARISON:  US RENAL dated 12/20/2013; CT ABD-PELV WO/W CM (HEMATURIA) dated 08/14/2010  MEDICATIONS: Fentanyl 100 mcg IV; Versed 2 mg IV   ANESTHESIA/SEDATION: Total Moderate Sedation time  10 minutes  COMPLICATIONS: None immediate  PROCEDURE: Informed written consent was obtained from the patient after a discussion of the risks, benefits and alternatives to treatment. The patient understands and consents the procedure. A timeout was performed prior to the initiation of the procedure.  Ultrasound scanning was performed of the bilateral flanks. The inferior pole of the right kidney was selected for biopsy due to location and sonographic window. The procedure was planned. The operative site was prepped and draped in the usual sterile fashion. The overlying soft tissues were anesthetized with 1% lidocaine with epinephrine. A 17 gauge core needle biopsy device was advanced into the inferior cortex of the right kidney and 2 core biopsies were obtained under direct ultrasound guidance. Real time pathologic review confirmed adequate tissue acquisition. Images were saved for documentation purposes. The biopsy device was removed and hemostasis was obtained with manual compression. Post procedural scanning was negative for significant post procedural hemorrhage or additional complication. A dressing was placed. The patient tolerated the procedure well without immediate post procedural complication.  IMPRESSION: Technically successful ultrasound guided right renal biopsy.   Electronically Signed   By: Sandi Mariscal M.D.   On: 12/25/2013 15:17    Labs: BMET  Recent Labs Lab 12/20/13 0333 12/21/13 0335 12/22/13 0338 12/23/13 0603 12/24/13 0415 12/25/13 0330 12/26/13 0428  NA 131* 130* 133* 134* 134* 138 140  K 4.9 4.5 4.6 4.8 4.2 4.4 4.6  CL 98 98 99 102 105 106 109  CO2 21 21  21 20 19 20 22   GLUCOSE 104* 95 106* 174* 157* 148* 105*  BUN 37* 34* 32* 36* 42* 43* 47*  CREATININE 1.57* 1.58* 1.56* 1.56* 1.56* 1.68* 1.52*  CALCIUM 8.7 8.7 9.0 9.3 8.7 9.2 8.9   CBC  Recent Labs Lab 12/23/13 0603 12/24/13 0415 12/25/13 0330 12/26/13 0428   WBC 10.5 24.2* 22.4* 16.8*  HGB 8.3* 7.8* 7.7* 7.7*  HCT 25.3* 22.6* 24.1* 23.0*  MCV 88.8 87.9 89.6 88.5  PLT 375 431* 485* 461*    Medications:    . Co-Enzyme Q10  300 mg Oral QHS  . Cyclophosphamide  150 mg Oral Daily  . dutasteride  0.5 mg Oral Daily   And  . tamsulosin  0.4 mg Oral Daily  . enoxaparin (LOVENOX) injection  1.5 mg/kg Subcutaneous Q24H  . ezetimibe-simvastatin  1 tablet Oral QHS  . latanoprost  1 drop Both Eyes Daily  . magnesium oxide  200 mg Oral QHS  . metoprolol tartrate  25 mg Oral BID  . montelukast  10 mg Oral QHS  . pantoprazole  40 mg Oral Daily  . predniSONE  60 mg Oral Q breakfast  . sodium chloride  3 mL Intravenous Q12H  . traMADol  50 mg Oral BID  . [START ON 12/27/2013] Warfarin - Pharmacist Dosing Inpatient   Does not apply [id number]   Elmarie Shiley, MD 12/26/2013, 1:04 PM

## 2013-12-26 NOTE — Discharge Summary (Signed)
Physician Discharge Summary  Bryan Hubbard MRN:[id number] DOB: 04/12/1941 DOA: 12/18/2013  PCP: KOIRALA,DIBAS, MD  Admit date: 12/18/2013 Discharge date: 12/26/2013  Recommendations for Outpatient Follow-up:  -  Follow up with Doctor Patel April 13th, 1:30PM to review biopsy results.  He will get an appointment reminder, labs 1 week before.   -  INR checked on Friday. Patient to continue Lovenox until INR is therapeutic.  Discharge Diagnoses:  Principal Problem:   CAP (community acquired pneumonia) Active Problems:   HYPERTENSION, BENIGN   Atrial fibrillation   DVT   Atrial flutter   Leukocytosis   Community acquired pneumonia   AKI (acute kidney injury)   Glomerulonephritis   Anemia of chronic disease   Thrombocytosis   Discharge Condition: Stable, improved  Diet recommendation: Healthy heart  Wt Readings from Last 3 Encounters:  12/26/13 89.948 kg (198 lb 4.8 oz)  08/31/13 88.814 kg (195 lb 12.8 oz)  01/11/12 86.229 kg (190 lb 1.6 oz)    History of present illness:  Bryan Hubbard is a 73 y.o. male who presents with a 2 week history of SOB. X ray on 3/19 showed B infiltrates. Patient started on levaquin. Despite that he was still SOB and so went back to his PCP today. PCP sent patient in to ED.  Earlier this morning patient also took 80mg lasix, (normally takes 20mg 2 times a week), accidentally took 80 this morning when he was supposed to take 40 per his PCP. He has noted no peripheral edema. He does have a h/o diastolic CHF.  His SOB is worse with activity, better when lying down, slight improvement with the lasix.  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  He presented with bilateral hazy pulmonary infiltrates on chest x-ray. He was initially treated with levofloxacin as an outpatient however symptoms worsened. He was started on ceftriaxone and azithromycin for community-acquired pneumonia. He did not have  high spiking fevers suggestive of MRSA pneumonia nor did he have evidence of immunosuppression requiring pseudomonal coverage. He continued to have hypoxic respiratory failure with minimal improvement in his symptoms despite several days of continued antibiotics. Labs reviewed from primary care doctor's office demonstrated negative Legionella and strep pneumo antigen tests. 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 See below for further details. After he has started steroids and cyclophosphamide, and his breathing quickly improved. He ultimately completed 1.5 g of azithromycin and 7 days of ceftriaxone, however his symptoms really improved with the steroids.  Glomerulonephritis: The patient has a baseline creatinine of 0.7, however his creatinine had increased to 1.66 admission. This was initially felt to be due to dehydration from pneumonia, however after IV fluids were administered, he had minimal change.   His FENa was 0.89 and his renal ultrasound was within normal limits.  Urinalysis demonstrated hematuria. Followup tests for  glomerulonephritis were sent and he is MPO positive, anti-GBM positive, p-ANCA for positive with a titer of 1:160, ANA negative, c-ANCA negative, PR-3 negative, ESR of 140. His ferritin was 1602. Nephrology was consulted, and they verified that he had active sediment on his urinalysis. He was started on Solu-Medrol 500mg IV once daily for three days (5.6/kg/day) and then transitioned to prednisone 60mg dialy.  He was also started on oral cyclophosphamide.  He underwent renal biopsy on March 30 without complication. The results were sent to [facility] for review. Reports should be available   in approximately one week, and the results will be discussed with the patient and his family by Doctor Patel at an already scheduled appointment in a few weeks.  Normocytic anemia due to inflammation/chronic disease. Iron  studies were consistent with inflammation, and although iron stores were low, he had a very elevated ferritin at 1602. Vitamin B12, folate, and TSH levels were within normal limits. Occult stool was negative. Urine protein electrophoresis and immunofixation were negative for monoclonal bands.  Serum protein electrophoresis demonstrated a possible faint restricted band and the beta-2 region with a beta-2 microglobulin of 8.6.  It is unlikely that he has multiple myeloma. 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  Atrial flutter, rate controlled. He continued his beta blocker. He initially can need warfarin, however he was transitioned to heparin infusion prior to renal biopsy. He received one dose of FFP prior to procedure. He will be bridged to Coumadin with Lovenox, dosing assisted by pharmacy, because he is probably hypercoagulable given the degree of inflammation that he has. He also has a history of spontaneous DVT.  Chronic diastolic heart failure, proBNP was elevated. He gained several pounds after being administered IV fluids to improve his kidney function.  He was advised to eat a low-salt diet, weight himself daily. He gained more than 3 pounds in one day or 5 pounds in one week, he should call the [facility] office.  Pleuritic right chest pain was likely pleuritis from inflammatory disease. TE was less likely as the patient was therapeutic on warfarin at the time that this occurred. His echocardiogram demonstrated an ejection fraction of 55%, mild LVH, mild to moderate mitral regurgitation, right ventricular dilation which was mild to moderate, mild to moderate reduced systolic function of the right ventricle, and peak PA pressure of 45 mm of mercury. Some of these changes may be related to acute vasculitis, and this is vasculitis improves, these findings may improve. Recommended he have an outpatient  echocardiogram 20s had further treatment for his vasculitis, to see if some of these finds are resolving. Telemetry demonstrated chronic atrial flutter which was rate controlled. There is no evidence of ischemia and EKG.  BPH, stable. Continued home medications. There is no obstruction noted on renal ultrasound.  Leukocytosis, rose dramatically with initiation of steroids, but has started to trend down now that he is on oral prednisone. There is no new signs of infection on review of systems or exam.  Thrombocytosis, acute phase reactants. Anticipate that this will trend down over time.  Hyponatremia, likely due to dehydration, and resolve with IV fluids.   Consultants:  Nephrology Procedures:  CXR Antibiotics:  Azithromycin 12/18/13>>> x 3 days, 1.5gm total  Rocephin 12/18/13 >> 3/29   Discharge Exam: Filed Vitals:   12/26/13 0602  BP: 117/71  Pulse: 75  Temp: 97.4 F (36.3 C)  Resp: 16   Filed Vitals:   12/25/13 1440 12/25/13 2117 12/26/13 0432 12/26/13 0602  BP: 116/67 133/78  117/71  Pulse: 94 84  75  Temp:  97.4 F (36.3 C)  97.4 F (36.3 C)  TempSrc:  Oral  Oral  Resp: 12 16  16  Height:      Weight:   89.948 kg (198 lb 4.8 oz)   SpO2: 93% 96%  98%    General: CM, No acute distress  HEENT: NCAT, MMM, rosy cheeks  Cardiovascular: RRR, nl S1, S2 no mrg, 2+ pulses, warm extremities    Respiratory: Few rare rales right base, otherwise clear, no rhonchi or wheeze, no increased WOB  Abdomen: NABS, soft, NT/ND  MSK: Normal tone and bulk, 1+ LEE  Neuro: Grossly intact   Discharge Instructions      Discharge Orders   Future Orders Complete By Expires   (HEART FAILURE PATIENTS) Call MD:  Anytime you have any of the following symptoms: 1) 3 pound weight gain in 24 hours or 5 pounds in 1 week 2) shortness of breath, with or without a dry hacking cough 3) swelling in the hands, feet or stomach 4) if you have to sleep on extra pillows at night in order to breathe.  As  directed    Call MD for:  difficulty breathing, headache or visual disturbances  As directed    Call MD for:  extreme fatigue  As directed    Call MD for:  hives  As directed    Call MD for:  persistant dizziness or light-headedness  As directed    Call MD for:  persistant nausea and vomiting  As directed    Call MD for:  redness, tenderness, or signs of infection (pain, swelling, redness, odor or green/yellow discharge around incision site)  As directed    Call MD for:  severe uncontrolled pain  As directed    Call MD for:  temperature >100.4  As directed    Diet - low sodium heart healthy  As directed    Discharge instructions  As directed    Comments:     You were hospitalized with difficulty breathing and were initially thought to have pneumonia. 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 We are treating inflammation with cyclophosphamide and prednisone. Please take each of these medications every day. You will review the results of your kidney biopsy with Doctor Patel at your followup appointment in a few weeks.  Please take your Lasix every day for the next 7 days, then resume taking your Lasix twice a week as before.  Please stop your diclofenac, potassium, and quinapril as these medications or not. If your kidneys are not working well.  He do not need to take your levofloxacin as you completed your antibiotics in the hospital. If you notice any increased swelling, blood in your urine, increased difficulty breathing, please return to the hospital.  Your INR is 1.5 to today. Please take Lovenox until your INR is above 2. Please have your primary care doctor repeat your INR on Friday.   Increase activity slowly  As directed        Medication List    STOP taking these medications       diclofenac 75 MG EC  tablet  Commonly known as:  VOLTAREN     levofloxacin 750 MG tablet  Commonly known as:  LEVAQUIN     potassium chloride SA 20 MEQ tablet  Commonly known as:  K-DUR,KLOR-CON     quinapril 20 MG tablet  Commonly known as:  ACCUPRIL      TAKE these medications       bimatoprost 0.01 % Soln  Commonly known as:  LUMIGAN  Place 1 drop into both eyes every morning.     CALCIUM + D3 PO  Take 1 tablet by mouth daily.     Coenzyme Q10 300 MG Caps  Take 300 mg   by mouth every evening.     cyanocobalamin 1000 MCG/ML injection  Commonly known as:  (VITAMIN B-12)  Inject 1,000 mcg into the muscle every 30 (thirty) days.     Cyclophosphamide 25 MG Caps  Take 6 capsules (150 mg total) by mouth daily.     Dutasteride-Tamsulosin HCl 0.5-0.4 MG Caps  Take 1 capsule by mouth daily.     enoxaparin 150 MG/ML injection  Commonly known as:  LOVENOX  Inject 0.9 mLs (135 mg total) into the skin daily.     furosemide 20 MG tablet  Commonly known as:  LASIX  Take 1 tablet (20 mg total) by mouth daily. On Tuesday and Thursday.     Magnesium 250 MG Tabs  Take 1 tablet by mouth every evening.     metoprolol tartrate 25 MG tablet  Commonly known as:  LOPRESSOR  Take 25 mg by mouth 2 (two) times daily.     montelukast 10 MG tablet  Commonly known as:  SINGULAIR  Take 10 mg by mouth at bedtime.     multivitamin tablet  Take 1 tablet by mouth daily.     omeprazole 20 MG capsule  Commonly known as:  PRILOSEC  Take 20 mg by mouth daily.     predniSONE 20 MG tablet  Commonly known as:  DELTASONE  Take 3 tablets (60 mg total) by mouth daily with breakfast.     traMADol 50 MG tablet  Commonly known as:  ULTRAM  Take 50 mg by mouth 2 (two) times daily at 10 am and 4 pm.     vitamin C 500 MG tablet  Commonly known as:  ASCORBIC ACID  Take 500 mg by mouth daily.     VYTORIN 10-20 MG per tablet  Generic drug:  ezetimibe-simvastatin  Take 1 tablet by mouth at bedtime.     warfarin 2  MG tablet  Commonly known as:  COUMADIN  Take 2 mg by mouth daily.       Follow-up Information   Follow up with KOIRALA,DIBAS, MD In 1 month.   Specialty:  Family Medicine   Contact information:   [address] [place] [address] [phone]       Follow up with PATEL,[name] K., MD On 01/08/2014. (1:30PM.  You will get appt reminder and lab request for 1 week before appt.  )    Specialty:  Nephrology   Contact information:   [address] [facility] [place] [address] [phone]        The results of significant diagnostics from this hospitalization (including imaging, microbiology, ancillary and laboratory) are listed below for reference.    Significant Diagnostic Studies: Dg Chest 2 View  12/18/2013   CLINICAL DATA:  Shortness of breath.  EXAM: CHEST  2 VIEW  COMPARISON:  DG CHEST 2 VIEW dated 12/14/2013; DG CHEST 2V dated 04/02/2012; CT ABD-PELV WO/W CM (HEMATURIA) dated 08/14/2010  FINDINGS: Bilateral patchy airspace opacities are mildly improved. Mild cardiomegaly. Thoracic spondylosis. No pleural effusion.  IMPRESSION: 1. Cardiomegaly with bilateral improved airspace opacities suggesting improving edema or improving atypical pneumonia.   Electronically Signed   By: Walt  Liebkemann M.D.   On: 12/18/2013 19:55   Dg Chest 2 View  12/14/2013   CLINICAL DATA:  Short of breath, fatigue, cough for several weeks  EXAM: CHEST  2 VIEW  COMPARISON:  Chest x-ray of 04/02/2012  FINDINGS: There are patchy bilateral lung opacities most consistent with multifocal pneumonia. Followup chest   x-ray is recommended to ensure clearing. No pleural effusion is seen. There is mild cardiomegaly present. Degenerative changes are noted throughout the thoracic spine.  IMPRESSION: Bilateral patchy lung opacities suspicious for multifocal pneumonia. Recommend followup.   Electronically Signed   By: Ivar Drape M.D.   On: 12/14/2013 15:11   US Renal  12/20/2013   CLINICAL  DATA:  Acute kidney injury.  EXAM: RENAL/URINARY TRACT ULTRASOUND COMPLETE  COMPARISON:  CT ABD-PELV WO/W CM (HEMATURIA) dated 08/14/2010  FINDINGS: Right Kidney:  Length: 13.0 cm. Echogenicity within normal limits. No mass or hydronephrosis visualized.  Left Kidney:  Length: 12.8 cm. Echogenicity within normal limits. No mass or hydronephrosis visualized.  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 Bladder is otherwise unremarkable in appearance.  IMPRESSION: 1. Unremarkable appearance of the kidneys.  No hydronephrosis. 2. Bladder diverticulum.   Electronically Signed   By: Logan Bores   On: 12/20/2013 18:39   US Biopsy  12/25/2013   INDICATION: Renal insufficiency, concern for glomerulonephritis  EXAM: ULTRASOUND GUIDED RENAL BIOPSY  COMPARISON:  US RENAL dated 12/20/2013; CT ABD-PELV WO/W CM (HEMATURIA) dated 08/14/2010  MEDICATIONS: Fentanyl 100 mcg IV; Versed 2 mg IV  ANESTHESIA/SEDATION: Total Moderate Sedation time  10 minutes  COMPLICATIONS: None immediate  PROCEDURE: Informed written consent was obtained from the patient after a discussion of the risks, benefits and alternatives to treatment. The patient understands and consents the procedure. A timeout was performed prior to the initiation of the procedure.  Ultrasound scanning was performed of the bilateral flanks. The inferior pole of the right kidney was selected for biopsy due to location and sonographic window. The procedure was planned. The operative site was prepped and draped in the usual sterile fashion. The overlying soft tissues were anesthetized with 1% lidocaine with epinephrine. A 17 gauge core needle biopsy device was advanced into the inferior cortex of the right kidney and 2 core biopsies were obtained under direct ultrasound guidance. Real time pathologic review confirmed adequate tissue acquisition. Images were saved for documentation purposes. The biopsy device was removed and hemostasis was obtained  with manual compression. Post procedural scanning was negative for significant post procedural hemorrhage or additional complication. A dressing was placed. The patient tolerated the procedure well without immediate post procedural complication.  IMPRESSION: Technically successful ultrasound guided right renal biopsy.   Electronically Signed   By: Sandi Mariscal M.D.   On: 12/25/2013 15:17   Dg Chest Port 1 View  12/19/2013   CLINICAL DATA:  Shortness of breath.  EXAM: PORTABLE CHEST - 1 VIEW  COMPARISON:  Chest x-ray 12/18/2013.  FINDINGS: Diffuse interstitial prominence and peribronchial cuffing, with patchy ill-defined airspace opacities, similar to recent prior examinations but new compared to prior study from 04/22/2012. No pleural effusions. No evidence of pulmonary edema. Heart size appears borderline enlarged, likely accentuated by portable AP technique. Upper mediastinal contours are within normal limits.  IMPRESSION: 1. The appearance of the chest is again suggestive of bronchitis with multilobar bronchopneumonia.   Electronically Signed   By: Vinnie Langton M.D.   On: 12/19/2013 06:36    Microbiology: No results found for this or any previous visit (from the past 240 hour(s)).   Labs: Basic Metabolic Panel:  Recent Labs Lab 12/22/13 0338 12/23/13 0603 12/24/13 0415 12/25/13 0330 12/26/13 0428  NA 133* 134* 134* 138 140  K 4.6 4.8 4.2 4.4 4.6  CL 99 102 105 106 109  CO2 _0 20  22  GLUCOSE 106* 174* 157* 148* 105*  BUN 32* 36* 42* 43* 47*  CREATININE 1.56* 1.56* 1.56* 1.68* 1.52*  CALCIUM 9.0 9.3 8.7 9.2 8.9   Liver Function Tests: No results found for this basename: AST, ALT, ALKPHOS, BILITOT, PROT, ALBUMIN,  in the last 168 hours No results found for this basename: LIPASE, AMYLASE,  in the last 168 hours No results found for this basename: AMMONIA,  in the last 168 hours CBC:  Recent Labs Lab 12/22/13 0338 12/23/13 0603 12/24/13 0415 12/25/13 0330 12/26/13 0428   WBC 13.0* 10.5 24.2* 22.4* 16.8*  HGB 8.3* 8.3* 7.8* 7.7* 7.7*  HCT 24.9* 25.3* 22.6* 24.1* 23.0*  MCV 88.0 88.8 87.9 89.6 88.5  PLT 432* 375 431* 485* 461*   Cardiac Enzymes: No results found for this basename: CKTOTAL, CKMB, CKMBINDEX, TROPONINI,  in the last 168 hours BNP: BNP (last 3 results)  Recent Labs  12/18/13 1635  PROBNP 5296.0*   CBG: No results found for this basename: GLUCAP,  in the last 168 hours  Time coordinating discharge: 45 minutes  Signed:  SHORT, MACKENZIE  [facility] 12/26/2013, 1:36 PM     

## 2013-12-27 ENCOUNTER — Encounter (HOSPITAL_COMMUNITY): Payer: Self-pay | Admitting: Radiology

## 2013-12-27 ENCOUNTER — Inpatient Hospital Stay (HOSPITAL_COMMUNITY): Payer: Medicare Other

## 2013-12-27 HISTORY — PX: RENAL BIOPSY, PERCUTANEOUS: SUR144

## 2013-12-27 LAB — BASIC METABOLIC PANEL
BUN: 48 mg/dL — ABNORMAL HIGH (ref 6–23)
CHLORIDE: 103 meq/L (ref 96–112)
CO2: 23 mEq/L (ref 19–32)
Calcium: 9 mg/dL (ref 8.4–10.5)
Creatinine, Ser: 1.65 mg/dL — ABNORMAL HIGH (ref 0.50–1.35)
GFR calc non Af Amer: 40 mL/min — ABNORMAL LOW (ref >90)
GFR, EST AFRICAN AMERICAN: 46 mL/min — AB (ref >90)
Glucose, Bld: 84 mg/dL (ref 70–99)
Potassium: 4 mEq/L (ref 3.7–5.3)
SODIUM: 138 meq/L (ref 137–147)

## 2013-12-27 LAB — CBC
HEMATOCRIT: 24.3 % — AB (ref 39.0–52.0)
HEMOGLOBIN: 7.8 g/dL — AB (ref 13.0–17.0)
MCH: 28.7 pg (ref 26.0–34.0)
MCHC: 32.1 g/dL (ref 30.0–36.0)
MCV: 89.3 fL (ref 78.0–100.0)
Platelets: 449 10*3/uL — ABNORMAL HIGH (ref 150–400)
RBC: 2.72 MIL/uL — ABNORMAL LOW (ref 4.22–5.81)
RDW: 13.6 % (ref 11.5–15.5)
WBC: 16.5 10*3/uL — AB (ref 4.0–10.5)

## 2013-12-27 LAB — APTT: aPTT: 32 seconds (ref 24–37)

## 2013-12-27 LAB — PROTIME-INR
INR: 1.39 (ref 0.00–1.49)
Prothrombin Time: 16.7 seconds — ABNORMAL HIGH (ref 11.6–15.2)

## 2013-12-27 MED ORDER — HEPARIN SODIUM (PORCINE) 1000 UNIT/ML IJ SOLN
INTRAMUSCULAR | Status: AC
  Filled 2013-12-27: qty 1

## 2013-12-27 MED ORDER — CEFAZOLIN SODIUM-DEXTROSE 2-3 GM-% IV SOLR
2.0000 g | INTRAVENOUS | Status: DC
  Filled 2013-12-27: qty 50

## 2013-12-27 MED ORDER — MIDAZOLAM HCL 2 MG/2ML IJ SOLN
INTRAMUSCULAR | Status: AC | PRN
  Administered 2013-12-27 (×3): 1 mg via INTRAVENOUS

## 2013-12-27 MED ORDER — FENTANYL CITRATE 0.05 MG/ML IJ SOLN
INTRAMUSCULAR | Status: AC | PRN
  Administered 2013-12-27 (×3): 25 ug via INTRAVENOUS

## 2013-12-27 MED ORDER — FENTANYL CITRATE 0.05 MG/ML IJ SOLN
INTRAMUSCULAR | Status: AC
  Filled 2013-12-27: qty 6

## 2013-12-27 MED ORDER — HEPARIN SODIUM (PORCINE) 1000 UNIT/ML IJ SOLN
INTRAMUSCULAR | Status: AC | PRN
  Administered 2013-12-27: 2 mL
  Administered 2013-12-27: 2.1 mL

## 2013-12-27 MED ORDER — CEFAZOLIN SODIUM-DEXTROSE 2-3 GM-% IV SOLR
2.0000 g | Freq: Once | INTRAVENOUS | Status: AC
  Administered 2013-12-27: 2 g via INTRAVENOUS
  Filled 2013-12-27: qty 50

## 2013-12-27 MED ORDER — CEFAZOLIN SODIUM-DEXTROSE 2-3 GM-% IV SOLR
INTRAVENOUS | Status: AC
  Administered 2013-12-27: 2 g via INTRAVENOUS
  Filled 2013-12-27: qty 50

## 2013-12-27 MED ORDER — MIDAZOLAM HCL 2 MG/2ML IJ SOLN
INTRAMUSCULAR | Status: AC
  Filled 2013-12-27: qty 6

## 2013-12-27 MED ORDER — LIDOCAINE-EPINEPHRINE (PF) 2 %-1:200000 IJ SOLN
INTRAMUSCULAR | Status: AC
  Filled 2013-12-27: qty 20

## 2013-12-27 NOTE — Progress Notes (Signed)
Patient ID: Bryan Hubbard, male   DOB: 1940/10/27, 73 y.o.   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 Plan for [facility] today followed by transfer to [facility] for P[facility]. Already on corticosteroids and cytoxan. Baseline creatinine around 1.2. No HD needs noted. The plan is for at least 2 plasmapheresis treatments in the hospital prior to DC for OP plasmapheresis (Total 10-12 in 2 weeks) 2 Anemia : Possibly anemia of critical illness plus/minus alveolar hemorrhage without hemoptysis. Low iron stores but elevated ferritin is prohibitive to intravenous iron therapy. Will give Aranesp  3 HTN- blood pressures well controlled at this time, no volume excess  4 Leukocytosis- demargination due to IV steroids- afebrile and without focal symptoms to suggest infection  Subjective:   Reports to be thirsty this morning (NPO for [facility]). Denies any SOB   Objective:   BP 119/79  Pulse 70  Temp(Src) 97.6 F (36.4 C) (Oral)  Resp 16  Ht 5' 10.5" (1.791 m)  Wt 87.499 kg (192 lb 14.4 oz)  BMI 27.28 kg/m2  SpO2 96%  Intake/Output Summary (Last 24 hours) at 12/27/13 1114 Last data filed at 12/27/13 0700  Gross per 24 hour  Intake    120 ml  Output   3750 ml  Net  -3630 ml   Weight change: -2.449 kg (-5 lb 6.4 oz)  Physical Exam: MGQ:[id number] sitting up in recliner ZTI:WPYKD RRR, normal S1 and S2 Resp:Fine bibasal rales, no wheeze/rhonchi XIP:JASN. Flat, NT, BS normal KNL:ZJQBH LE edema  Imaging: US Biopsy  12/25/2013   INDICATION: Renal insufficiency, concern for glomerulonephritis  EXAM: ULTRASOUND GUIDED RENAL BIOPSY  COMPARISON:  US RENAL dated 12/20/2013; CT ABD-PELV WO/W CM (HEMATURIA) dated 08/14/2010  MEDICATIONS: Fentanyl 100 mcg IV; Versed 2 mg IV  ANESTHESIA/SEDATION: Total Moderate Sedation time  10 minutes   COMPLICATIONS: None immediate  PROCEDURE: Informed written consent was obtained from the patient after a discussion of the risks, benefits and alternatives to treatment. The patient understands and consents the procedure. A timeout was performed prior to the initiation of the procedure.  Ultrasound scanning was performed of the bilateral flanks. The inferior pole of the right kidney was selected for biopsy due to location and sonographic window. The procedure was planned. The operative site was prepped and draped in the usual sterile fashion. The overlying soft tissues were anesthetized with 1% lidocaine with epinephrine. A 17 gauge core needle biopsy device was advanced into the inferior cortex of the right kidney and 2 core biopsies were obtained under direct ultrasound guidance. Real time pathologic review confirmed adequate tissue acquisition. Images were saved for documentation purposes. The biopsy device was removed and hemostasis was obtained with manual compression. Post procedural scanning was negative for significant post procedural hemorrhage or additional complication. A dressing was placed. The patient tolerated the procedure well without immediate post procedural complication.  IMPRESSION: Technically successful ultrasound guided right renal biopsy.   Electronically Signed   By: Sandi Mariscal M.D.   On: 12/25/2013 15:17    Labs: BMET  Recent Labs Lab 12/21/13 0335 12/22/13 4193 12/23/13 0603 12/24/13 0415 12/25/13 0330 12/26/13 0428 12/27/13 0504  NA 130* 133* 134* 134* 138 140 138  K 4.5 4.6 4.8 4.2 4.4 4.6 4.0  CL 98 99 102 105 106 109 103  CO2 21 21 20  19  20 22 23   GLUCOSE 95 106* 174* 157* 148* 105* 84  BUN 34* 32* 36* 42* 43* 47* 48*  CREATININE 1.58* 1.56* 1.56* 1.56* 1.68* 1.52* 1.65*  CALCIUM 8.7 9.0 9.3 8.7 9.2 8.9 9.0   CBC  Recent Labs Lab 12/24/13 0415 12/25/13 0330 12/26/13 0428 12/27/13 0504  WBC 24.2* 22.4* 16.8* 16.5*  HGB 7.8* 7.7* 7.7* 7.8*  HCT 22.6*  24.1* 23.0* 24.3*  MCV 87.9 89.6 88.5 89.3  PLT 431* 485* 461* 449*   Medications:    . Co-Enzyme Q10  300 mg Oral QHS  . Cyclophosphamide  150 mg Oral Daily  . dutasteride  0.5 mg Oral Daily   And  . tamsulosin  0.4 mg Oral Daily  . ezetimibe-simvastatin  1 tablet Oral QHS  . latanoprost  1 drop Both Eyes Daily  . magnesium oxide  200 mg Oral QHS  . metoprolol tartrate  25 mg Oral BID  . montelukast  10 mg Oral QHS  . pantoprazole  40 mg Oral Daily  . predniSONE  60 mg Oral Q breakfast  . sodium chloride  3 mL Intravenous Q12H  . traMADol  50 mg Oral BID   Elmarie Shiley, MD 12/27/2013, 11:14 AM

## 2013-12-27 NOTE — Progress Notes (Signed)
Subjective: Patient underwent renal biopsy 3/30, preliminary results consistent with Goodpasture's disease. IR received request for image guided tunneled plasmapheresis catheter placement, he is scheduled to have his first session today. He denies any chest pain, shortness of breath or palpitations. He denies any active signs of bleeding or excessive bruising. He denies any recent fever or chills. The patient denies any history of sleep apnea or chronic oxygen use. He has previously tolerated sedation without complications.    Objective: Physical Exam: BP 119/79  Pulse 70  Temp(Src) 97.6 F (36.4 C) (Oral)  Resp 16  Ht 5' 10.5" (1.791 m)  Wt 192 lb 14.4 oz (87.499 kg)  BMI 27.28 kg/m2  SpO2 96%  General: A&Ox3, NAD, sitting up in bed. Heart: RRR without M/G/R Lungs: CTA bilaterally. Abd: Soft, NT, ND, (+) BS    Labs: CBC  Recent Labs  12/26/13 0428 12/27/13 0504  WBC 16.8* 16.5*  HGB 7.7* 7.8*  HCT 23.0* 24.3*  PLT 461* 449*   BMET  Recent Labs  12/26/13 0428 12/27/13 0504  NA 140 138  K 4.6 4.0  CL 109 103  CO2 22 23  GLUCOSE 105* 84  BUN 47* 48*  CREATININE 1.52* 1.65*  CALCIUM 8.9 9.0   LFT No results found for this basename: PROT, ALBUMIN, AST, ALT, ALKPHOS, BILITOT, BILIDIR, IBILI, LIPASE,  in the last 72 hours PT/INR  Recent Labs  12/26/13 0428 12/27/13 0504  LABPROT 17.5* 16.7*  INR 1.48 1.39     Studies/Results: US Biopsy  12/25/2013   INDICATION: Renal insufficiency, concern for glomerulonephritis  EXAM: ULTRASOUND GUIDED RENAL BIOPSY  COMPARISON:  US RENAL dated 12/20/2013; CT ABD-PELV WO/W CM (HEMATURIA) dated 08/14/2010  MEDICATIONS: Fentanyl 100 mcg IV; Versed 2 mg IV  ANESTHESIA/SEDATION: Total Moderate Sedation time  10 minutes  COMPLICATIONS: None immediate  PROCEDURE: Informed written consent was obtained from the patient after a discussion of the risks, benefits and alternatives to treatment. The patient understands and consents the  procedure. A timeout was performed prior to the initiation of the procedure.  Ultrasound scanning was performed of the bilateral flanks. The inferior pole of the right kidney was selected for biopsy due to location and sonographic window. The procedure was planned. The operative site was prepped and draped in the usual sterile fashion. The overlying soft tissues were anesthetized with 1% lidocaine with epinephrine. A 17 gauge core needle biopsy device was advanced into the inferior cortex of the right kidney and 2 core biopsies were obtained under direct ultrasound guidance. Real time pathologic review confirmed adequate tissue acquisition. Images were saved for documentation purposes. The biopsy device was removed and hemostasis was obtained with manual compression. Post procedural scanning was negative for significant post procedural hemorrhage or additional complication. A dressing was placed. The patient tolerated the procedure well without immediate post procedural complication.  IMPRESSION: Technically successful ultrasound guided right renal biopsy.   Electronically Signed   By: Sandi Mariscal M.D.   On: 12/25/2013 15:17    Assessment/Plan: Acute renal insufficiency s/p renal biopsy with preliminary results from [facility] consistent with Goodpasture's disease. Request for image guided tunneled plasmapheresis catheter. Atrial fibrillation, coumadin, heparin, lovenox has been held, INR today 1.39 Leukocytosis on steroids, afebrile.  Risks and Benefits discussed with the patient. All of the patient's questions were answered, patient is agreeable to proceed. Consent signed and in chart.    LOS: 9 days    Hedy Jacob PA-C 12/27/2013 10:50 AM

## 2013-12-27 NOTE — Progress Notes (Signed)

## 2013-12-27 NOTE — Progress Notes (Signed)
Report given to [facility] and pt transported to [facility] via stretcher. Report called to [place], RN on [place], [place]

## 2013-12-27 NOTE — Progress Notes (Signed)
[facility] PROGRESS NOTE  [place] KXF:[id number] DOB: 1941/09/18 DOA: 12/18/2013 PCP: [name], MD  Assessment/Plan: Acute hypoxic respiratory failure with bilateral pulmonary infiltrates on CXR. Previously treated as outpatient, but symptoms worsened. Legionella/S. pneumo from clinic were both negative. 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 Completed 7 days of treatment for community-acquired pneumonia.  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 Cont beta blocker.  -  Will need to have coumadin resumed after catheter placement.  Diastolic heart failure, chronic, pro-BNP elevated  - Daily weights (stable)  - Strict I/O   Pleuritic right chest pain, likely related to pleuritis from pneumonia vs. Less likely pericarditis. PE less likely as patient was therapeutic on warfarin at time that this occurred. Pain only occurrs with deep inspiration and movement.  - Telemetry demonstrated chronic a-flutter, rate controlled  - No evidence of ischemia on ECG  - Continue tramadol prn  - ECHO: Ejection fraction of 55%, mild LVH, mild to moderate mitral regurgitation, right ventricle mildly to moderately dilated with mild to moderate reduced systolic function and peak PA pressure of 45 mm mercury.    BPH, continue avodart/flomax  - [place]: No obstruction   Leukocytosis, rose with initiation of steroids and now trending down.  Thrombocytosis, rising, acute phase reactant   Hyponatremia, resolved with IVF.   Code Status: full Family Communication: discussed with patient and spouse  Disposition Plan: Transfer to [facility] for further treatment recommendations from nephrology. After 2 sessions of plasmapharesis patient may be discharged per Nephro.   Consultants:  Nephrology  Procedures:  Renal biopsy  Placement of tunneled HD catheter   Antibiotics:  None  HPI/Subjective: No new complaints. No acute issues reported overnight.  Objective: Filed Vitals:   12/27/13 1559  BP: 135/71  Pulse: 89  Temp:   Resp: 19    Intake/Output Summary (Last 24 hours) at 12/27/13 1601 Last data filed at 12/27/13 0700  Gross per 24 hour  Intake      0 ml  Output   2600 ml  Net  -2600 ml   Filed Weights   12/25/13 0637 12/26/13 0432 12/27/13 0451  Weight: 88.905 kg (196 lb) 89.948 kg (198 lb 4.8 oz) 87.499 kg (192 lb 14.4 oz)    Exam:   General:  Pt in NAD, alert and awake  Cardiovascular: normal s1 and s2 no murmurs   Respiratory: CTA BL, no wheezes  Abdomen: soft, NT, ND  Musculoskeletal: no cyanosis or clubbing   Data Reviewed: Basic Metabolic Panel:  Recent Labs Lab 12/23/13 0603 12/24/13 0415 12/25/13 0330 12/26/13 0428 12/27/13 0504  NA 134* 134* 138 140 138  K 4.8 4.2 4.4 4.6 4.0  CL 102 105 106 109 103  CO2 20 19 20 22 23   GLUCOSE 174* 157* 148* 105* 84  BUN 36* 42* 43* 47* 48*  CREATININE 1.56* 1.56* 1.68* 1.52* 1.65*  CALCIUM 9.3 8.7 9.2 8.9 9.0   Liver Function Tests: No results found for this basename: AST, ALT, ALKPHOS, BILITOT, PROT, ALBUMIN,  in the last 168 hours No results found for this basename: LIPASE, AMYLASE,  in the last 168 hours No results found for this basename: AMMONIA,  in the last 168 hours CBC:  Recent Labs Lab 12/23/13 0603 12/24/13 0415 12/25/13 0330 12/26/13 0428 12/27/13 0504  WBC 10.5 24.2* 22.4* 16.8* 16.5*  HGB 8.3* 7.8* 7.7* 7.7* 7.8*  HCT 25.3* 22.6* 24.1* 23.0* 24.3*  MCV  88.8 87.9 89.6 88.5 89.3  PLT 375 431* 485* 461* 449*   Cardiac Enzymes: No results found for this basename: CKTOTAL, CKMB, CKMBINDEX, TROPONINI,  in the last 168 hours BNP (last 3 results)  Recent Labs  12/18/13 1635  PROBNP 5296.0*   CBG: No results found for this basename: GLUCAP,  in the last 168 hours  No results found for this or any previous visit (from the past 240 hour(s)).   Studies: No results found.  Scheduled Meds: .  ceFAZolin (ANCEF) IV  2 g Intravenous Once  . Co-Enzyme Q10  300 mg Oral QHS  . Cyclophosphamide  150 mg Oral Daily  . dutasteride  0.5 mg Oral Daily   And  . tamsulosin  0.4 mg Oral Daily  . ezetimibe-simvastatin  1 tablet Oral QHS  . fentaNYL      . heparin      . latanoprost  1 drop Both Eyes Daily  . lidocaine-EPINEPHrine      . magnesium oxide  200 mg Oral QHS  . metoprolol tartrate  25 mg Oral BID  . midazolam      . montelukast  10 mg Oral QHS  . pantoprazole  40 mg Oral Daily  . predniSONE  60 mg Oral Q breakfast  . sodium chloride  3 mL Intravenous Q12H  . traMADol  50 mg Oral BID   Continuous Infusions:    Time spent: > 35 minutes    Velvet Bathe  [facility] Pager [phone]. If 7PM-7AM, please contact night-coverage at [email or web address], password [facility] 12/27/2013, 4:01 PM  LOS: 9 days

## 2013-12-27 NOTE — Procedures (Signed)
Successful placement of tunneled HD catheter with tips terminating within the superior aspect of the right atrium.   The patient tolerated the procedure well without immediate post procedural complication. The catheter is ready for immediate use.  

## 2013-12-27 NOTE — Progress Notes (Signed)
ANTICOAGULATION CONSULT NOTE - Follow up  Pharmacy Consult for warfarin/Lovenox Indication: atrial fibrillation  No Known Allergies  Patient Measurements: Height: 5' 10.5" (179.1 cm) Weight: 192 lb 14.4 oz (87.499 kg) IBW/kg (Calculated) : 74.15  Labs:  Recent Labs  12/24/13 1426 12/24/13 2335  12/25/13 0330 12/25/13 1304 12/26/13 0428 12/27/13 0504  HGB  --   --   < > 7.7*  --  7.7* 7.8*  HCT  --   --   --  24.1*  --  23.0* 24.3*  PLT  --   --   --  485*  --  461* 449*  APTT  --   --   --   --   --   --  32  LABPROT  --   --   < > 18.3* 17.0* 17.5* 16.7*  INR  --   --   < > 1.57* 1.42 1.48 1.39  HEPARINUNFRC 0.25* 0.33  --   --   --  <0.10*  --   CREATININE  --   --   --  1.68*  --  1.52* 1.65*  < > = values in this interval not displayed.  Estimated Creatinine Clearance: 41.8 ml/min (by C-G formula based on Cr of 1.65).  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  Patient developed AKI and nephrology ordered for renal biopsy so warfarin was placed on hold and patient was bridged with IV heparin until biopsy could be performed (waited for INR<1.5).  Renal biopsy completed 3/30 afternoon.  Resumed warfarin with Lovenox bridge on 3/31 at 1200.  Later on 3/31, diagnosis of Goodpasture's disease was made and new plan to place tunneled dialysis catheter for plasmapheresis.  Patient received Vitamin K 2.5 mg 3/31 and MD holding further anticoagulation.  INR 1.39  Hgb low (7.8) but stable.  Platelets 449K.  Goal of Therapy:  INR 2-3 Monitor platelets by anticoagulation protocol: Yes   Plan:  Holding anticoagulation per MD.  Please re-consult or alert pharmacy when anticoagulation is to be resumed.  Patient waiting for [facility] placement today.  After placement, plan is to transfer patient to [facility] for PLEX.   Thank you.  Hershal Coria, PharmD, BCPS Pager: [phone] 12/27/2013 2:21 PM

## 2013-12-28 DIAGNOSIS — I7789 Other specified disorders of arteries and arterioles: Secondary | ICD-10-CM

## 2013-12-28 LAB — CBC
HCT: 21.4 % — ABNORMAL LOW (ref 39.0–52.0)
HEMOGLOBIN: 7.4 g/dL — AB (ref 13.0–17.0)
MCH: 30.3 pg (ref 26.0–34.0)
MCHC: 34.6 g/dL (ref 30.0–36.0)
MCV: 87.7 fL (ref 78.0–100.0)
Platelets: 378 10*3/uL (ref 150–400)
RBC: 2.44 MIL/uL — ABNORMAL LOW (ref 4.22–5.81)
RDW: 13.7 % (ref 11.5–15.5)
WBC: 13.2 10*3/uL — AB (ref 4.0–10.5)

## 2013-12-28 LAB — POCT I-STAT, CHEM 8
BUN: 38 mg/dL — AB (ref 6–23)
CHLORIDE: 99 meq/L (ref 96–112)
CREATININE: 1.6 mg/dL — AB (ref 0.50–1.35)
Calcium, Ion: 1.21 mmol/L (ref 1.13–1.30)
Glucose, Bld: 115 mg/dL — ABNORMAL HIGH (ref 70–99)
HCT: 20 % — ABNORMAL LOW (ref 39.0–52.0)
Hemoglobin: 6.8 g/dL — CL (ref 13.0–17.0)
POTASSIUM: 3.4 meq/L — AB (ref 3.7–5.3)
Sodium: 138 mEq/L (ref 137–147)
TCO2: 23 mmol/L (ref 0–100)

## 2013-12-28 LAB — ABO/RH: ABO/RH(D): A NEG

## 2013-12-28 LAB — PREPARE RBC (CROSSMATCH)

## 2013-12-28 LAB — RETICULOCYTES
RBC.: 2.44 MIL/uL — ABNORMAL LOW (ref 4.22–5.81)
RETIC COUNT ABSOLUTE: 36.6 10*3/uL (ref 19.0–186.0)
Retic Ct Pct: 1.5 % (ref 0.4–3.1)

## 2013-12-28 MED ORDER — SODIUM CHLORIDE 0.9 % IV SOLN
Freq: Once | INTRAVENOUS | Status: AC
  Administered 2013-12-28: 12:00:00 via INTRAVENOUS_CENTRAL
  Filled 2013-12-28: qty 200

## 2013-12-28 MED ORDER — ENOXAPARIN SODIUM 150 MG/ML ~~LOC~~ SOLN
130.0000 mg | SUBCUTANEOUS | Status: DC
  Administered 2013-12-28 – 2013-12-30 (×3): 130 mg via SUBCUTANEOUS
  Filled 2013-12-28 (×3): qty 1

## 2013-12-28 MED ORDER — FUROSEMIDE 10 MG/ML IJ SOLN
20.0000 mg | Freq: Once | INTRAMUSCULAR | Status: AC
  Administered 2013-12-28: 20 mg via INTRAVENOUS

## 2013-12-28 MED ORDER — SODIUM CHLORIDE 0.9 % IV SOLN
INTRAVENOUS | Status: AC
  Administered 2013-12-28 (×6): via INTRAVENOUS_CENTRAL
  Filled 2013-12-28 (×6): qty 200

## 2013-12-28 MED ORDER — DIPHENHYDRAMINE HCL 25 MG PO CAPS: 25.0000 mg | ORAL_CAPSULE | Freq: Four times a day (QID) | ORAL | Status: DC | PRN

## 2013-12-28 MED ORDER — ACD FORMULA A 0.73-2.45-2.2 GM/100ML VI SOLN
Status: AC
  Filled 2013-12-28: qty 500

## 2013-12-28 MED ORDER — HEPARIN SODIUM (PORCINE) 1000 UNIT/ML IJ SOLN: 1000.0000 [IU] | Freq: Once | INTRAMUSCULAR | Status: DC

## 2013-12-28 MED ORDER — FUROSEMIDE 10 MG/ML IJ SOLN
20.0000 mg | Freq: Once | INTRAMUSCULAR | Status: AC
  Administered 2013-12-28: 20 mg via INTRAVENOUS
  Filled 2013-12-28 (×2): qty 2

## 2013-12-28 MED ORDER — SODIUM CHLORIDE 0.9 % IV SOLN
4.0000 g | Freq: Once | INTRAVENOUS | Status: AC
  Administered 2013-12-28: 4 g via INTRAVENOUS
  Filled 2013-12-28: qty 40

## 2013-12-28 MED ORDER — ACETAMINOPHEN 325 MG PO TABS: 650.0000 mg | ORAL_TABLET | ORAL | Status: DC | PRN

## 2013-12-28 MED ORDER — ACD FORMULA A 0.73-2.45-2.2 GM/100ML VI SOLN
500.0000 mL | Status: DC
  Administered 2013-12-28: 500 mL via INTRAVENOUS

## 2013-12-28 MED ORDER — HEPARIN SODIUM (PORCINE) 1000 UNIT/ML IJ SOLN
1000.0000 [IU] | Freq: Once | INTRAMUSCULAR | Status: DC
  Filled 2013-12-28: qty 1

## 2013-12-28 MED ORDER — CALCIUM GLUCONATE 10 % IV SOLN: 4.0000 g | Freq: Once | INTRAVENOUS | Status: DC

## 2013-12-28 MED ORDER — SODIUM CHLORIDE 0.9 % IV SOLN: INTRAVENOUS | Status: DC

## 2013-12-28 MED ORDER — CALCIUM CARBONATE ANTACID 500 MG PO CHEW
2.0000 | CHEWABLE_TABLET | ORAL | Status: DC
  Administered 2013-12-28: 400 mg via ORAL
  Filled 2013-12-28 (×2): qty 2

## 2013-12-28 MED ORDER — CALCIUM CARBONATE ANTACID 500 MG PO CHEW: 2.0000 | CHEWABLE_TABLET | ORAL | Status: DC

## 2013-12-28 MED ORDER — ACD FORMULA A 0.73-2.45-2.2 GM/100ML VI SOLN: 500.0000 mL | Status: DC

## 2013-12-28 MED ORDER — ACD FORMULA A 0.73-2.45-2.2 GM/100ML VI SOLN
Status: AC
  Administered 2013-12-28: 12:00:00
  Filled 2013-12-28: qty 500

## 2013-12-28 MED ORDER — FUROSEMIDE 20 MG PO TABS
20.0000 mg | ORAL_TABLET | Freq: Every day | ORAL | Status: DC
  Filled 2013-12-28: qty 1

## 2013-12-28 NOTE — Procedures (Signed)
Patient seen on Plasmapheresis (1st treatment today for Goodpastures). Tolerating the procedure well. Treatment adjusted as needed.  Elmarie Shiley MD [facility] Office # [phone] Pager # [phone] 12:01 PM

## 2013-12-28 NOTE — Progress Notes (Signed)
Patient ID: Bryan Hubbard, male   DOB: [birth year], 73 y.o.   MRN: [id number]   [facility] Progress Note   Assessment/ Plan:   1 ARF: Preliminary renal biopsy IF revealed anti-GBM disease that appears to be in its acute inflammatory state with 3/5 glomeruli with crescents. TDC placed yesterday followed by transfer to [facility]. He is going to be started on PLEX today. Already on corticosteroids and cytoxan. Baseline creatinine around 1.2. No HD needs noted. The plan is for at least 2 plasmapheresis treatments in the hospital prior to DC for OP plasmapheresis (Total 10 over 2 weeks)  2 Anemia : Possibly anemia of critical illness plus/minus alveolar hemorrhage without hemoptysis. Low iron stores but elevated ferritin is prohibitive to intravenous iron therapy. Will give Aranesp  3 HTN- blood pressures well controlled at this time, edema noted on pedal exam-start oral furosemide  4 Leukocytosis- demargination due to IV steroids- afebrile and without focal symptoms to suggest infection   Subjective:   Reports to have had a comfortable night, denies any chest pain or shortness of breath    Objective:   BP 131/90  Pulse 71  Temp(Src) 97.7 F (36.5 C) (Oral)  Resp 18  Ht 5' 10.5" (1.791 m)  Wt 86.909 kg (191 lb 9.6 oz)  BMI 27.09 kg/m2  SpO2 100%  Physical Exam: Gen: Comfortably resting in bed, wife by bedside CVS: Pulse regular in rate and rhythm, S1 and S2 normal Resp: Fine rales left base otherwise clear to auscultation Abd: Soft, flat, nontender and bowel sounds normal Ext: 1+  edema bipedally  Labs: BMET  Recent Labs Lab 12/22/13 0338 12/23/13 0603 12/24/13 0415 12/25/13 0330 12/26/13 0428 12/27/13 0504  NA 133* 134* 134* 138 140 138  K 4.6 4.8 4.2 4.4 4.6 4.0  CL 99 102 105 106 109 103  CO2 21 20 19 20 22 23   GLUCOSE 106* 174* 157* 148* 105* 84  BUN 32* 36* 42* 43* 47* 48*  CREATININE 1.56* 1.56* 1.56* 1.68* 1.52* 1.65*  CALCIUM 9.0 9.3 8.7 9.2 8.9 9.0    CBC  Recent Labs Lab 12/24/13 0415 12/25/13 0330 12/26/13 0428 12/27/13 0504  WBC 24.2* 22.4* 16.8* 16.5*  HGB 7.8* 7.7* 7.7* 7.8*  HCT 22.6* 24.1* 23.0* 24.3*  MCV 87.9 89.6 88.5 89.3  PLT 431* 485* 461* 449*   Medications:    . therapeutic plasma exchange solution   Dialysis Once in dialysis  . calcium carbonate  2 tablet Oral Q3H  . calcium gluconate IVPB  4 g Intravenous Once  . citrate dextrose      . Cyclophosphamide  150 mg Oral Daily  . dutasteride  0.5 mg Oral Daily   And  . tamsulosin  0.4 mg Oral Daily  . ezetimibe-simvastatin  1 tablet Oral QHS  . heparin  1,000 Units Intracatheter Once  . latanoprost  1 drop Both Eyes Daily  . magnesium oxide  200 mg Oral QHS  . metoprolol tartrate  25 mg Oral BID  . montelukast  10 mg Oral QHS  . pantoprazole  40 mg Oral Daily  . predniSONE  60 mg Oral Q breakfast  . sodium chloride  3 mL Intravenous Q12H  . traMADol  50 mg Oral BID   Elmarie Shiley, MD 12/28/2013, 9:24 AM

## 2013-12-28 NOTE — Progress Notes (Signed)
Late Entry   New Admission: Transfer from [place] Method: Stretcher Mental Orientation: AOX4 Telemetry: na Assessment: completed Skin: intact IV: no piv, removed  Pain: denies  Tubes: na  Safety Measures: initiated  Admission: completed [place] Orientation: completed  Family: at bedside   Orders have been reviewed and implemented. Will continue to monitor the patient. Call light has been placed within reach and bed alarm has been activated.  [employer] BSN, RN-BC [phone]

## 2013-12-28 NOTE — Progress Notes (Signed)
ANTIMICROBIAL CONSULT NOTE - INITIAL  Pharmacy Consult for warfarin/lovenox Indication: h/o DVT and AFib  No Known Allergies  Patient Measurements: Height: 5' 10.5" (179.1 cm) Weight: 191 lb 9.6 oz (86.909 kg) IBW/kg (Calculated) : 74.15  Vital Signs: Temp: 97.3 F (36.3 C) (04/02 1310) Temp src: Oral (04/02 1310) BP: 139/82 mmHg (04/02 1310) Pulse Rate: 107 (04/02 1310) Intake/Output from previous day: 04/01 0701 - 04/02 0700 In: 240 [P.O.:240] Out: 550 [Urine:550] Intake/Output from this shift: Total I/O In: 1126 [P.O.:680; Blood:446] Out: 0   Labs:  Recent Labs  12/26/13 0428 12/27/13 0504 12/28/13 1138 12/28/13 1146  WBC 16.8* 16.5*  --  13.2*  HGB 7.7* 7.8* 6.8* 7.4*  PLT 461* 449*  --  378  CREATININE 1.52* 1.65* 1.60*  --    Estimated Creatinine Clearance: 43.2 ml/min (by C-G formula based on Cr of 1.6). No results found for this basename: VANCOTROUGH, VANCOPEAK, VANCORANDOM, GENTTROUGH, GENTPEAK, GENTRANDOM, TOBRATROUGH, TOBRAPEAK, TOBRARND, AMIKACINPEAK, AMIKACINTROU, AMIKACIN,  in the last 72 hours   Microbiology: No results found for this or any previous visit (from the past 720 hour(s)).  Medical History: Past Medical History  Diagnosis Date  . Atrial fibrillation   . Chronic diastolic heart failure   . DVT   . HYPERLIPIDEMIA   . HYPERTENSION, BENIGN   . ALLERGIC ASTHMA   . Palpitations     Medications:  Prescriptions prior to admission  Medication Sig Dispense Refill  . bimatoprost (LUMIGAN) 0.01 % SOLN Place 1 drop into both eyes every morning.      . Calcium Carb-Cholecalciferol (CALCIUM + D3 PO) Take 1 tablet by mouth daily.      . Coenzyme Q10 300 MG CAPS Take 300 mg by mouth every evening.      . cyanocobalamin (,VITAMIN B-12,) 1000 MCG/ML injection Inject 1,000 mcg into the muscle every 30 (thirty) days.       . Dutasteride-Tamsulosin HCl 0.5-0.4 MG CAPS Take 1 capsule by mouth daily.      . Magnesium 250 MG TABS Take 1 tablet by  mouth every evening.      . metoprolol tartrate (LOPRESSOR) 25 MG tablet Take 25 mg by mouth 2 (two) times daily.      . montelukast (SINGULAIR) 10 MG tablet Take 10 mg by mouth at bedtime.       . Multiple Vitamin (MULTIVITAMIN) tablet Take 1 tablet by mouth daily.      Marland Kitchen omeprazole (PRILOSEC) 20 MG capsule Take 20 mg by mouth daily.       . traMADol (ULTRAM) 50 MG tablet Take 50 mg by mouth 2 (two) times daily at 10 am and 4 pm.       . vitamin C (ASCORBIC ACID) 500 MG tablet Take 500 mg by mouth daily.      Marland Kitchen VYTORIN 10-20 MG per tablet Take 1 tablet by mouth at bedtime.       Marland Kitchen warfarin (COUMADIN) 2 MG tablet Take 2 mg by mouth daily.      . [DISCONTINUED] diclofenac (VOLTAREN) 75 MG EC tablet Take 75 mg by mouth daily.       . [DISCONTINUED] furosemide (LASIX) 20 MG tablet Take 20 mg by mouth 2 (two) times a week. On Tuesday and Thursday.      . [DISCONTINUED] levofloxacin (LEVAQUIN) 750 MG tablet Take 750 mg by mouth daily. For 10 days.      . [DISCONTINUED] potassium chloride SA (K-DUR,KLOR-CON) 20 MEQ tablet Take 20 mEq by mouth 2 (  two) times a week. On Tuesday and Thursday.      . [DISCONTINUED] quinapril (ACCUPRIL) 20 MG tablet Take 20 mg by mouth daily.        Assessment: 73 year old male with significant comorbidities.  On warfarin prior to admission for h/o DVT and AFib.  Warfarin has been held for renal biopsy.  Pt had first plasmapheresis session today for ANCA-like disease.  Now Lovenox will be initiated.  Renal has asked for once daily dosing of Lovenox.  Goal of Therapy:  INR 2-3 Monitor platelets while on anticoagulation: Yes  Plan:  Lovenox 1.5 mg/kg once daily -> Lovenox 130 mg [place] once daily Hold warfarin Consider ease of administration at discharge, Lovenox available as 120 mg syringe or 150 mg syringe CBC q72h  Hughes Better, PharmD, BCPS Clinical Pharmacist Pager: [phone] 12/28/2013 3:33 PM

## 2013-12-28 NOTE — Progress Notes (Signed)
[facility] PROGRESS NOTE  Bryan Hubbard DUK:[id number] DOB: December 06, 1940 DOA: 12/18/2013 PCP: [name], MD  Assessment/Plan: Acute hypoxic respiratory failure with bilateral pulmonary infiltrates on CXR. Previously treated as outpatient, but symptoms worsened. Legionella/S. pneumo from clinic were both negative. 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 Completed 7 days of treatment for community-acquired pneumonia.  - resolved -denies hemoptysis.   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  - Nephrology planning for at least 2 plasmapheresis treatments in the hospital prior to DC for OP plasmapheresis (Total 10-12 in 2 weeks) -Continue with prednisone and Cyclophosphamide.    Normocytic anemia due to inflammation, hgb trending down slightly  - Iron studies consistent with inflammation, very elevated ferritin  - B12 wnl  - Folate wnl  - Occult stool negative  -Will received 2 units PRBC today.   Aflutter - presently rate controlled. Cont beta blocker.  - He will be discharge on Lovenox. Due to questionable alveolar hemorrhage.  -After 2 weeks this will transition to coumadin. Discussed with Dr Posey Pronto.   Diastolic heart failure, chronic, pro-BNP elevated  - Daily weights (stable)  - Strict I/O   Pleuritic right chest pain, likely related to pleuritis. PE less likely as patient was therapeutic on warfarin at time that this occurred. Pain only occurrs with deep inspiration and movement.  - Telemetry demonstrated chronic a-flutter, rate controlled  - No evidence of ischemia on ECG  - Continue tramadol prn  - ECHO: Ejection fraction of 55%, mild LVH, mild to moderate mitral regurgitation, right ventricle mildly to moderately dilated with mild to moderate reduced systolic function and peak PA pressure of 45 mm mercury.  -resolved.   BPH, continue  avodart/flomax  - [place]: No obstruction   Leukocytosis, rose with initiation of steroids and now trending down.  Thrombocytosis, rising, acute phase reactant  Hyponatremia, resolved with IVF.   Code Status: full Family Communication: discussed with patient.  Disposition Plan: home tomorrow if tolerates well plasmapheresis.    Consultants:  Nephrology  Procedures:  Renal biopsy  Placement of tunneled HD catheter   Antibiotics:  None  HPI/Subjective: No new complaints. No acute issues reported overnight. No hemoptysis. No chest pain.   Objective: Filed Vitals:   12/28/13 1310  BP: 139/82  Pulse: 107  Temp: 97.3 F (36.3 C)  Resp: 18    Intake/Output Summary (Last 24 hours) at 12/28/13 1536 Last data filed at 12/28/13 1300  Gross per 24 hour  Intake   1366 ml  Output    550 ml  Net    816 ml   Filed Weights   12/26/13 0432 12/27/13 0451 12/27/13 2019  Weight: 89.948 kg (198 lb 4.8 oz) 87.499 kg (192 lb 14.4 oz) 86.909 kg (191 lb 9.6 oz)    Exam:   General:  Pt in NAD, alert and awake  Cardiovascular: normal s1 and s2 no murmurs   Respiratory: CTA BL, no wheezes  Abdomen: soft, NT, ND  Musculoskeletal: no cyanosis or clubbing   Data Reviewed: Basic Metabolic Panel:  Recent Labs Lab 12/23/13 0603 12/24/13 0415 12/25/13 0330 12/26/13 0428 12/27/13 0504 12/28/13 1138  NA 134* 134* 138 140 138 138  K 4.8 4.2 4.4 4.6 4.0 3.4*  CL 102 105 106 109 103 99  CO2 _0 --   GLUCOSE 174* 157* 148* 105* 84 115*  BUN 36* 42* 43* 47* 48*  38*  CREATININE 1.56* 1.56* 1.68* 1.52* 1.65* 1.60*  CALCIUM 9.3 8.7 9.2 8.9 9.0  --    Liver Function Tests: No results found for this basename: AST, ALT, ALKPHOS, BILITOT, PROT, ALBUMIN,  in the last 168 hours No results found for this basename: LIPASE, AMYLASE,  in the last 168 hours No results found for this basename: AMMONIA,  in the last 168 hours CBC:  Recent Labs Lab 12/24/13 0415 12/25/13 0330  12/26/13 0428 12/27/13 0504 12/28/13 1138 12/28/13 1146  WBC 24.2* 22.4* 16.8* 16.5*  --  13.2*  HGB 7.8* 7.7* 7.7* 7.8* 6.8* 7.4*  HCT 22.6* 24.1* 23.0* 24.3* 20.0* 21.4*  MCV 87.9 89.6 88.5 89.3  --  87.7  PLT 431* 485* 461* 449*  --  378   Cardiac Enzymes: No results found for this basename: CKTOTAL, CKMB, CKMBINDEX, TROPONINI,  in the last 168 hours BNP (last 3 results)  Recent Labs  12/18/13 1635  PROBNP 5296.0*   CBG: No results found for this basename: GLUCAP,  in the last 168 hours  No results found for this or any previous visit (from the past 240 hour(s)).   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  EXAM: TUNNELED CENTRAL VENOUS HEMODIALYSIS CATHETER PLACEMENT WITH ULTRASOUND AND FLUOROSCOPIC GUIDANCE  MEDICATIONS: Ancef 2 gm IV; The IV antibiotic was given in an appropriate time interval prior to skin puncture.  CONTRAST:  None  ANESTHESIA/SEDATION: Versed 3 mg IV; Fentanyl 75 mcg IV  Total Moderate Sedation Time  20 minutes.  FLUOROSCOPY TIME:  30 seconds.  COMPLICATIONS: None immediate  PROCEDURE: Informed written consent was obtained from the patient after a discussion of the risks, benefits, and alternatives to treatment. Questions regarding the procedure were encouraged and answered. The right neck and chest were prepped with chlorhexidine in a sterile fashion, and a sterile drape was applied covering the operative field. Maximum barrier sterile technique with sterile gowns and gloves were used for the procedure. A timeout was performed prior to the initiation of the procedure.  After creating a small venotomy incision, a micropuncture kit was utilized to access the right internal jugular vein under direct, real-time ultrasound guidance after the overlying soft tissues were anesthetized with 1% lidocaine with epinephrine. Ultrasound image  documentation was performed. The microwire was kinked to measure appropriate catheter length. A stiff Glidewire was advanced to the level of the IVC and the micropuncture sheath was exchanged for a peel-away sheath. A hemosplit tunneled hemodialysis catheter measuring 23 cm from tip to cuff was tunneled in a retrograde fashion from the anterior chest wall to the venotomy incision.  The catheter was then placed through the peel-away sheath with tips ultimately positioned within the superior aspect of the right atrium. Final catheter positioning was confirmed and documented with a spot radiographic image. The catheter aspirates and flushes normally. The catheter was flushed with appropriate volume heparin dwells.  The catheter exit site was secured with a 0-Prolene retention suture. The venotomy incision was closed with an interrupted 4-0 Vicryl, Dermabond and Steri-strips. Dressings were applied. The patient tolerated the procedure well without immediate post procedural complication.  IMPRESSION: Successful placement of 23 cm tip to cuff tunneled hemodialysis catheter via the right internal jugular vein with tips terminating within the superior aspect of the right atrium. The catheter is ready for immediate use.   Electronically Signed   By: Jenny Reichmann  Watts M.D.   On: 12/27/2013 16:17   Ir US Guide Vasc Access Right  12/27/2013   INDICATION: History of Goodpasture's syndrome, now in need of intravenous access for the initiation of plasmapheresis.  EXAM: TUNNELED CENTRAL VENOUS HEMODIALYSIS CATHETER PLACEMENT WITH ULTRASOUND AND FLUOROSCOPIC GUIDANCE  MEDICATIONS: Ancef 2 gm IV; The IV antibiotic was given in an appropriate time interval prior to skin puncture.  CONTRAST:  None  ANESTHESIA/SEDATION: Versed 3 mg IV; Fentanyl 75 mcg IV  Total Moderate Sedation Time  20 minutes.  FLUOROSCOPY TIME:  30 seconds.  COMPLICATIONS: None immediate  PROCEDURE: Informed written consent was obtained from the patient after a  discussion of the risks, benefits, and alternatives to treatment. Questions regarding the procedure were encouraged and answered. The right neck and chest were prepped with chlorhexidine in a sterile fashion, and a sterile drape was applied covering the operative field. Maximum barrier sterile technique with sterile gowns and gloves were used for the procedure. A timeout was performed prior to the initiation of the procedure.  After creating a small venotomy incision, a micropuncture kit was utilized to access the right internal jugular vein under direct, real-time ultrasound guidance after the overlying soft tissues were anesthetized with 1% lidocaine with epinephrine. Ultrasound image documentation was performed. The microwire was kinked to measure appropriate catheter length. A stiff Glidewire was advanced to the level of the IVC and the micropuncture sheath was exchanged for a peel-away sheath. A hemosplit tunneled hemodialysis catheter measuring 23 cm from tip to cuff was tunneled in a retrograde fashion from the anterior chest wall to the venotomy incision.  The catheter was then placed through the peel-away sheath with tips ultimately positioned within the superior aspect of the right atrium. Final catheter positioning was confirmed and documented with a spot radiographic image. The catheter aspirates and flushes normally. The catheter was flushed with appropriate volume heparin dwells.  The catheter exit site was secured with a 0-Prolene retention suture. The venotomy incision was closed with an interrupted 4-0 Vicryl, Dermabond and Steri-strips. Dressings were applied. The patient tolerated the procedure well without immediate post procedural complication.  IMPRESSION: Successful placement of 23 cm tip to cuff tunneled hemodialysis catheter via the right internal jugular vein with tips terminating within the superior aspect of the right atrium. The catheter is ready for immediate use.   Electronically  Signed   By: Sandi Mariscal M.D.   On: 12/27/2013 16:17    Scheduled Meds: . calcium gluconate IVPB  4 g Intravenous Once  . Cyclophosphamide  150 mg Oral Daily  . dutasteride  0.5 mg Oral Daily   And  . tamsulosin  0.4 mg Oral Daily  . ezetimibe-simvastatin  1 tablet Oral QHS  . furosemide  20 mg Intravenous Once  . furosemide  20 mg Intravenous Once  . latanoprost  1 drop Both Eyes Daily  . magnesium oxide  200 mg Oral QHS  . metoprolol tartrate  25 mg Oral BID  . montelukast  10 mg Oral QHS  . pantoprazole  40 mg Oral Daily  . predniSONE  60 mg Oral Q breakfast  . sodium chloride  3 mL Intravenous Q12H  . traMADol  50 mg Oral BID   Continuous Infusions:    Time spent: > 30 minutes    [place], [facility] Pager [phone]. If 7PM-7AM, please contact night-coverage at [email or web address], password [facility] 12/28/2013, 3:36 PM  LOS: 10 days

## 2013-12-29 ENCOUNTER — Inpatient Hospital Stay (HOSPITAL_COMMUNITY): Payer: Medicare Other

## 2013-12-29 LAB — TYPE AND SCREEN
ABO/RH(D): A NEG
Antibody Screen: NEGATIVE
UNIT DIVISION: 0
Unit division: 0

## 2013-12-29 LAB — CBC
HCT: 27.2 % — ABNORMAL LOW (ref 39.0–52.0)
HEMOGLOBIN: 9.4 g/dL — AB (ref 13.0–17.0)
MCH: 29.3 pg (ref 26.0–34.0)
MCHC: 34.6 g/dL (ref 30.0–36.0)
MCV: 84.7 fL (ref 78.0–100.0)
Platelets: 344 10*3/uL (ref 150–400)
RBC: 3.21 MIL/uL — AB (ref 4.22–5.81)
RDW: 16.2 % — ABNORMAL HIGH (ref 11.5–15.5)
WBC: 13.3 10*3/uL — AB (ref 4.0–10.5)

## 2013-12-29 LAB — PREPARE FRESH FROZEN PLASMA
UNIT DIVISION: 0
Unit division: 0

## 2013-12-29 LAB — POCT I-STAT, CHEM 8
BUN: 37 mg/dL — ABNORMAL HIGH (ref 6–23)
CALCIUM ION: 1.28 mmol/L (ref 1.13–1.30)
CHLORIDE: 104 meq/L (ref 96–112)
Creatinine, Ser: 1.7 mg/dL — ABNORMAL HIGH (ref 0.50–1.35)
Glucose, Bld: 164 mg/dL — ABNORMAL HIGH (ref 70–99)
HEMATOCRIT: 27 % — AB (ref 39.0–52.0)
HEMOGLOBIN: 9.2 g/dL — AB (ref 13.0–17.0)
POTASSIUM: 3.1 meq/L — AB (ref 3.7–5.3)
Sodium: 142 mEq/L (ref 137–147)
TCO2: 20 mmol/L (ref 0–100)

## 2013-12-29 LAB — BASIC METABOLIC PANEL
BUN: 40 mg/dL — ABNORMAL HIGH (ref 6–23)
CO2: 22 meq/L (ref 19–32)
Calcium: 8.9 mg/dL (ref 8.4–10.5)
Chloride: 104 mEq/L (ref 96–112)
Creatinine, Ser: 1.54 mg/dL — ABNORMAL HIGH (ref 0.50–1.35)
GFR calc Af Amer: 50 mL/min — ABNORMAL LOW (ref >90)
GFR calc non Af Amer: 43 mL/min — ABNORMAL LOW (ref >90)
GLUCOSE: 137 mg/dL — AB (ref 70–99)
POTASSIUM: 4.3 meq/L (ref 3.7–5.3)
Sodium: 140 mEq/L (ref 137–147)

## 2013-12-29 LAB — D-DIMER, QUANTITATIVE (NOT AT ARMC): D DIMER QUANT: 7.83 ug{FEU}/mL — AB (ref 0.00–0.48)

## 2013-12-29 MED ORDER — TECHNETIUM TC 99M DIETHYLENETRIAME-PENTAACETIC ACID: 40.0000 | Freq: Once | INTRAVENOUS | Status: AC | PRN

## 2013-12-29 MED ORDER — POTASSIUM CHLORIDE CRYS ER 20 MEQ PO TBCR
20.0000 meq | EXTENDED_RELEASE_TABLET | Freq: Two times a day (BID) | ORAL | Status: AC
  Administered 2013-12-29 – 2013-12-30 (×3): 20 meq via ORAL
  Filled 2013-12-29 (×3): qty 1

## 2013-12-29 MED ORDER — ACD FORMULA A 0.73-2.45-2.2 GM/100ML VI SOLN
Status: AC
  Administered 2013-12-29: 10:00:00
  Filled 2013-12-29: qty 500

## 2013-12-29 MED ORDER — FUROSEMIDE 10 MG/ML IJ SOLN
40.0000 mg | Freq: Once | INTRAMUSCULAR | Status: AC
  Administered 2013-12-29: 40 mg via INTRAVENOUS
  Filled 2013-12-29: qty 4

## 2013-12-29 MED ORDER — FUROSEMIDE 20 MG PO TABS
20.0000 mg | ORAL_TABLET | Freq: Every day | ORAL | Status: DC
  Filled 2013-12-29: qty 1

## 2013-12-29 MED ORDER — HEPARIN SODIUM (PORCINE) 1000 UNIT/ML IJ SOLN: 1000.0000 [IU] | Freq: Once | INTRAMUSCULAR | Status: DC

## 2013-12-29 MED ORDER — HYDRALAZINE HCL 20 MG/ML IJ SOLN
5.0000 mg | Freq: Once | INTRAMUSCULAR | Status: AC
  Administered 2013-12-29: 5 mg via INTRAVENOUS
  Filled 2013-12-29: qty 1

## 2013-12-29 MED ORDER — ACETAMINOPHEN 325 MG PO TABS: 650.0000 mg | ORAL_TABLET | ORAL | Status: DC | PRN

## 2013-12-29 MED ORDER — CALCIUM CARBONATE ANTACID 500 MG PO CHEW
2.0000 | CHEWABLE_TABLET | ORAL | Status: DC
  Filled 2013-12-29 (×2): qty 2

## 2013-12-29 MED ORDER — DIPHENHYDRAMINE HCL 25 MG PO CAPS: 25.0000 mg | ORAL_CAPSULE | Freq: Four times a day (QID) | ORAL | Status: DC | PRN

## 2013-12-29 MED ORDER — ACD FORMULA A 0.73-2.45-2.2 GM/100ML VI SOLN
500.0000 mL | Status: DC
  Filled 2013-12-29: qty 500

## 2013-12-29 MED ORDER — TECHNETIUM TO 99M ALBUMIN AGGREGATED
6.0000 | Freq: Once | INTRAVENOUS | Status: AC | PRN
  Administered 2013-12-29: 6 via INTRAVENOUS

## 2013-12-29 MED ORDER — ALBUMIN HUMAN 25 % IV SOLN
INTRAVENOUS | Status: DC
  Administered 2013-12-29 (×6): via INTRAVENOUS_CENTRAL
  Filled 2013-12-29 (×7): qty 200

## 2013-12-29 MED ORDER — ACD FORMULA A 0.73-2.45-2.2 GM/100ML VI SOLN
Status: AC
  Administered 2013-12-29: 11:00:00
  Filled 2013-12-29: qty 500

## 2013-12-29 MED ORDER — SODIUM CHLORIDE 0.9 % IV SOLN
4.0000 g | Freq: Once | INTRAVENOUS | Status: AC
  Administered 2013-12-29: 4 g via INTRAVENOUS
  Filled 2013-12-29: qty 40

## 2013-12-29 NOTE — Procedures (Signed)
Patient seen on Plasmapheresis. Appears comfortable on oxygen via [place] (3L/min) Treatment adjusted as needed.  Elmarie Shiley MD [facility]. Office # [phone] Pager # [phone] 10:03 AM

## 2013-12-29 NOTE — Progress Notes (Signed)
[facility] PROGRESS NOTE  Bryan Hubbard VFI:[id number] DOB: [birth year] DOA: 12/18/2013 PCP: [name], MD  Assessment/Plan: Acute hypoxic respiratory failure with bilateral pulmonary infiltrates on CXR. Previously treated as outpatient, but symptoms worsened. Legionella/S. pneumo from clinic were both negative. 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 Completed 7 days of treatment for community-acquired pneumonia.  - resolved -denies hemoptysis.  -Patient developed Dyspnea yesterday 4-02. He has JVD on physical exam. Dyspnea could be related to volume overload/ improved with lasix. Oxygen saturation at 100 % on 3 L.  -Lovenox was resume yesterday.  -Will check V-Q scan, but patient already getting Lovenox.   Glomerulonephritis, good pasteur S.  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  - Nephrology planning for at least 2 plasmapheresis treatments in the hospital prior to DC for OP plasmapheresis (Total 10-12 in 2 weeks) -Continue with prednisone and Cyclophosphamide.    Normocytic anemia due to inflammation, hgb trending down slightly  - Iron studies consistent with inflammation, very elevated ferritin  - B12 wnl  - Folate wnl  - Occult stool negative  -Will received 2 units PRBC 4-2.  Aflutter - presently rate controlled. Cont beta blocker.  - He will be discharge on Lovenox. Due to questionable alveolar hemorrhage.  -After 2 weeks this will transition to coumadin. Discussed with Dr Posey Pronto.   Diastolic heart failure, chronic, pro-BNP elevated  - Daily weights (stable)  - Strict I/O  -stared on lasix.   Pleuritic right chest pain; resolved.   likely related to pleuritis. PE less likely as patient was therapeutic on warfarin at time that this occurred. Pain only occurrs with deep inspiration and movement.  - Telemetry demonstrated chronic a-flutter, rate  controlled  - No evidence of ischemia on ECG  - ECHO: Ejection fraction of 55%, mild LVH, mild to moderate mitral regurgitation, right ventricle mildly to moderately dilated with mild to moderate reduced systolic function and peak PA pressure of 45 mm mercury.    BPH, continue avodart/flomax  - [place]: No obstruction   Leukocytosis, rose with initiation of steroids and now trending down. WBC stable.  Thrombocytosis, rising, acute phase reactant. Resolved.  Hyponatremia, resolved with IVF.   Code Status: full Family Communication: discussed with patient and wife.  Disposition Plan: To be determine.    Consultants:  Nephrology  Procedures:  Renal biopsy  Placement of tunneled HD catheter   Antibiotics:  None  HPI/Subjective: Patient relates that he developed Dyspnea yesterday afternoon. He feels dyspnea got better after he was started on oxygen and received lasix and the blood transfusion. He is breathing better now. He denies chest pain.   Objective: Filed Vitals:   12/29/13 1126  BP: 123/62  Pulse: 86  Temp: 97.5 F (36.4 C)  Resp: 21    Intake/Output Summary (Last 24 hours) at 12/29/13 1213 Last data filed at 12/29/13 0331  Gross per 24 hour  Intake    515 ml  Output   3050 ml  Net  -2535 ml   Filed Weights   12/27/13 2019 12/29/13 0439 12/29/13 0949  Weight: 86.909 kg (191 lb 9.6 oz) 85.4 kg (188 lb 4.4 oz) 86.1 kg (189 lb 13.1 oz)    Exam:   General:  Pt in NAD, alert and awake, now with 3 Nl.   Cardiovascular: normal s1 and s2 no murmurs   Respiratory: few crackles bases, no wheezes  Abdomen: soft, NT, ND  Musculoskeletal: no cyanosis or clubbing   Data Reviewed: Basic Metabolic Panel:  Recent Labs Lab 12/24/13 0415 12/25/13 0330 12/26/13 0428 12/27/13 0504 12/28/13 1138 12/29/13 0545 12/29/13 1026  NA 134* 138 140 138 138 140 142  K 4.2 4.4 4.6 4.0 3.4* 4.3 3.1*  CL 105 106 109 103 99 104 104  CO2 $Re'19 20 22 23  'OIi$ --  22  --   GLUCOSE  157* 148* 105* 84 115* 137* 164*  BUN 42* 43* 47* 48* 38* 40* 37*  CREATININE 1.56* 1.68* 1.52* 1.65* 1.60* 1.54* 1.70*  CALCIUM 8.7 9.2 8.9 9.0  --  8.9  --    Liver Function Tests: No results found for this basename: AST, ALT, ALKPHOS, BILITOT, PROT, ALBUMIN,  in the last 168 hours No results found for this basename: LIPASE, AMYLASE,  in the last 168 hours No results found for this basename: AMMONIA,  in the last 168 hours CBC:  Recent Labs Lab 12/25/13 0330 12/26/13 0428 12/27/13 0504 12/28/13 1138 12/28/13 1146 12/29/13 0545 12/29/13 1026  WBC 22.4* 16.8* 16.5*  --  13.2* 13.3*  --   HGB 7.7* 7.7* 7.8* 6.8* 7.4* 9.4* 9.2*  HCT 24.1* 23.0* 24.3* 20.0* 21.4* 27.2* 27.0*  MCV 89.6 88.5 89.3  --  87.7 84.7  --   PLT 485* 461* 449*  --  378 344  --    Cardiac Enzymes: No results found for this basename: CKTOTAL, CKMB, CKMBINDEX, TROPONINI,  in the last 168 hours BNP (last 3 results)  Recent Labs  12/18/13 1635  PROBNP 5296.0*   CBG: No results found for this basename: GLUCAP,  in the last 168 hours  No results found for this or any previous visit (from the past 240 hour(s)).   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  EXAM: TUNNELED CENTRAL VENOUS HEMODIALYSIS CATHETER PLACEMENT WITH ULTRASOUND AND FLUOROSCOPIC GUIDANCE  MEDICATIONS: Ancef 2 gm IV; The IV antibiotic was given in an appropriate time interval prior to skin puncture.  CONTRAST:  None  ANESTHESIA/SEDATION: Versed 3 mg IV; Fentanyl 75 mcg IV  Total Moderate Sedation Time  20 minutes.  FLUOROSCOPY TIME:  30 seconds.  COMPLICATIONS: None immediate  PROCEDURE: Informed written consent was obtained from the patient after a discussion of the risks, benefits, and alternatives to treatment. Questions regarding the procedure were encouraged and answered. The right neck and chest were prepped  with chlorhexidine in a sterile fashion, and a sterile drape was applied covering the operative field. Maximum barrier sterile technique with sterile gowns and gloves were used for the procedure. A timeout was performed prior to the initiation of the procedure.  After creating a small venotomy incision, a micropuncture kit was utilized to access the right internal jugular vein under direct, real-time ultrasound guidance after the overlying soft tissues were anesthetized with 1% lidocaine with epinephrine. Ultrasound image documentation was performed. The microwire was kinked to measure appropriate catheter length. A stiff Glidewire was advanced to the level of the IVC and the micropuncture sheath was exchanged for a peel-away sheath. A hemosplit tunneled hemodialysis catheter measuring 23 cm from tip to cuff was tunneled in a retrograde fashion from the anterior chest wall to the venotomy incision.  The catheter was then placed through the peel-away sheath with tips ultimately positioned within the superior aspect of the right atrium. Final catheter positioning was confirmed and documented  with a spot radiographic image. The catheter aspirates and flushes normally. The catheter was flushed with appropriate volume heparin dwells.  The catheter exit site was secured with a 0-Prolene retention suture. The venotomy incision was closed with an interrupted 4-0 Vicryl, Dermabond and Steri-strips. Dressings were applied. The patient tolerated the procedure well without immediate post procedural complication.  IMPRESSION: Successful placement of 23 cm tip to cuff tunneled hemodialysis catheter via the right internal jugular vein with tips terminating within the superior aspect of the right atrium. The catheter is ready for immediate use.   Electronically Signed   By: Sandi Mariscal M.D.   On: 12/27/2013 16:17   Ir US Guide Vasc Access Right  12/27/2013   INDICATION: History of Goodpasture's syndrome, now in need of  intravenous access for the initiation of plasmapheresis.  EXAM: TUNNELED CENTRAL VENOUS HEMODIALYSIS CATHETER PLACEMENT WITH ULTRASOUND AND FLUOROSCOPIC GUIDANCE  MEDICATIONS: Ancef 2 gm IV; The IV antibiotic was given in an appropriate time interval prior to skin puncture.  CONTRAST:  None  ANESTHESIA/SEDATION: Versed 3 mg IV; Fentanyl 75 mcg IV  Total Moderate Sedation Time  20 minutes.  FLUOROSCOPY TIME:  30 seconds.  COMPLICATIONS: None immediate  PROCEDURE: Informed written consent was obtained from the patient after a discussion of the risks, benefits, and alternatives to treatment. Questions regarding the procedure were encouraged and answered. The right neck and chest were prepped with chlorhexidine in a sterile fashion, and a sterile drape was applied covering the operative field. Maximum barrier sterile technique with sterile gowns and gloves were used for the procedure. A timeout was performed prior to the initiation of the procedure.  After creating a small venotomy incision, a micropuncture kit was utilized to access the right internal jugular vein under direct, real-time ultrasound guidance after the overlying soft tissues were anesthetized with 1% lidocaine with epinephrine. Ultrasound image documentation was performed. The microwire was kinked to measure appropriate catheter length. A stiff Glidewire was advanced to the level of the IVC and the micropuncture sheath was exchanged for a peel-away sheath. A hemosplit tunneled hemodialysis catheter measuring 23 cm from tip to cuff was tunneled in a retrograde fashion from the anterior chest wall to the venotomy incision.  The catheter was then placed through the peel-away sheath with tips ultimately positioned within the superior aspect of the right atrium. Final catheter positioning was confirmed and documented with a spot radiographic image. The catheter aspirates and flushes normally. The catheter was flushed with appropriate volume heparin dwells.   The catheter exit site was secured with a 0-Prolene retention suture. The venotomy incision was closed with an interrupted 4-0 Vicryl, Dermabond and Steri-strips. Dressings were applied. The patient tolerated the procedure well without immediate post procedural complication.  IMPRESSION: Successful placement of 23 cm tip to cuff tunneled hemodialysis catheter via the right internal jugular vein with tips terminating within the superior aspect of the right atrium. The catheter is ready for immediate use.   Electronically Signed   By: Sandi Mariscal M.D.   On: 12/27/2013 16:17   Dg Chest Port 1 View  12/29/2013   CLINICAL DATA:  Short of breath.  On plasmapheresis.  EXAM: PORTABLE CHEST - 1 VIEW  COMPARISON:  12/19/2013  FINDINGS: Cardiac silhouette is mildly enlarged. Normal mediastinal and hilar contours. As noted previously, there is bilateral interstitial thickening. No focal consolidation. No pleural effusion or pneumothorax.  Right internal jugular dual-lumen central venous catheter has its distal tip the caval atrial junction, new from  the prior exam.  IMPRESSION: 1. Interstitial prominence described previously is similar, again raising the possibility of bronchiolitis. Mild interstitial edema should be considered in the proper clinical setting. No focal consolidation to suggest lobar pneumonia. 2. Stable cardiomegaly. 3. New right internal jugular dual-lumen central venous catheter is well positioned. No pneumothorax.   Electronically Signed   By: Lajean Manes M.D.   On: 12/29/2013 10:17    Scheduled Meds: . calcium gluconate IVPB  4 g Intravenous Once  . Cyclophosphamide  150 mg Oral Daily  . dutasteride  0.5 mg Oral Daily   And  . tamsulosin  0.4 mg Oral Daily  . enoxaparin (LOVENOX) injection  130 mg Subcutaneous Q24H  . ezetimibe-simvastatin  1 tablet Oral QHS  . [START ON 12/30/2013] furosemide  20 mg Oral Daily  . latanoprost  1 drop Both Eyes Daily  . magnesium oxide  200 mg Oral QHS  .  metoprolol tartrate  25 mg Oral BID  . montelukast  10 mg Oral QHS  . pantoprazole  40 mg Oral Daily  . potassium chloride  20 mEq Oral BID  . predniSONE  60 mg Oral Q breakfast  . sodium chloride  3 mL Intravenous Q12H  . traMADol  50 mg Oral BID   Continuous Infusions:    Time spent: > 30 minutes    Cadyn Rodger, [place] Pager [phone]. If 7PM-7AM, please contact night-coverage at [email or web address], password [facility] 12/29/2013, 12:13 PM  LOS: 11 days

## 2013-12-29 NOTE — Progress Notes (Signed)
Patient ID: Bryan Hubbard, male   DOB: 1941/06/03, 73 y.o.   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 Started on corticosteroid/cyclophosphamide and now plasmapheresis-second treatment today. Plan for a total 11 treatments over the next 2 and half weeks (again tomorrow followed by M./W./F./Saturday for 2 weeks) Will recheck anti GBM antibody next week. He does not have hemoptysis. 2 Anemia : Status post packed red cell transfusion yesterday with appropriate correction of hemoglobin. 3 HTN- blood pressures well controlled at this time, edema noted on pedal exam-start oral furosemide  4 Dyspnea/hypoxia: Suspicious for volume overload and improved with furosemide-chest x-ray to be done today to confirm. 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 Elevated d-dimer noted, his chest x-ray not indicative of vascular congestion/edema and he remains short of breath, may need VQ scan.  Subjective:   Reports some shortness of breath yesterday in the evening/night that improved with furosemide. This happened after plasmapheresis and around the time of his transfusion    Objective:   BP 158/85  Pulse 67  Temp(Src) 98.2 F (36.8 C) (Oral)  Resp 18  Ht 5' 10.5" (1.791 m)  Wt 85.4 kg (188 lb 4.4 oz)  BMI 26.62 kg/m2  SpO2 98%  Intake/Output Summary (Last 24 hours) at 12/29/13 0947 Last data filed at 12/29/13 0331  Gross per 24 hour  Intake   1321 ml  Output   3050 ml  Net  -1729 ml   Weight change: -1.509 kg (-3 lb 5.2 oz)  Physical Exam: Gen: Comfortably resting in bed-oxygen via nasal cannula 3 L per minute CVS: Pulse regular in rate and rhythm, S1 and S2 normal Resp: Fine rales left base otherwise clear to auscultation Abd: Soft, flat, nontender and bowel sounds are normal Ext: One plus lower  extremity edema  Imaging: Ir Fluoro Guide Cv Line Right  12/27/2013   INDICATION: History of Goodpasture's syndrome, now in need of intravenous access for the initiation of plasmapheresis.  EXAM: TUNNELED CENTRAL VENOUS HEMODIALYSIS CATHETER PLACEMENT WITH ULTRASOUND AND FLUOROSCOPIC GUIDANCE  MEDICATIONS: Ancef 2 gm IV; The IV antibiotic was given in an appropriate time interval prior to skin puncture.  CONTRAST:  None  ANESTHESIA/SEDATION: Versed 3 mg IV; Fentanyl 75 mcg IV  Total Moderate Sedation Time  20 minutes.  FLUOROSCOPY TIME:  30 seconds.  COMPLICATIONS: None immediate  PROCEDURE: Informed written consent was obtained from the patient after a discussion of the risks, benefits, and alternatives to treatment. Questions regarding the procedure were encouraged and answered. The right neck and chest were prepped with chlorhexidine in a sterile fashion, and a sterile drape was applied covering the operative field. Maximum barrier sterile technique with sterile gowns and gloves were used for the procedure. A timeout was performed prior to the initiation of the procedure.  After creating a small venotomy incision, a micropuncture kit was utilized to access the right internal jugular vein under direct, real-time ultrasound guidance after the overlying soft tissues were anesthetized with 1% lidocaine with epinephrine. Ultrasound image documentation was performed. The microwire was kinked to measure appropriate catheter length. A stiff Glidewire was advanced to the level of the IVC and the micropuncture sheath was exchanged for a peel-away sheath. A hemosplit tunneled hemodialysis catheter measuring 23 cm from tip to cuff was tunneled in a retrograde fashion  from the anterior chest wall to the venotomy incision.  The catheter was then placed through the peel-away sheath with tips ultimately positioned within the superior aspect of the right atrium. Final catheter positioning was confirmed and documented with a  spot radiographic image. The catheter aspirates and flushes normally. The catheter was flushed with appropriate volume heparin dwells.  The catheter exit site was secured with a 0-Prolene retention suture. The venotomy incision was closed with an interrupted 4-0 Vicryl, Dermabond and Steri-strips. Dressings were applied. The patient tolerated the procedure well without immediate post procedural complication.  IMPRESSION: Successful placement of 23 cm tip to cuff tunneled hemodialysis catheter via the right internal jugular vein with tips terminating within the superior aspect of the right atrium. The catheter is ready for immediate use.   Electronically Signed   By: Sandi Mariscal M.D.   On: 12/27/2013 16:17   Ir US Guide Vasc Access Right  12/27/2013   INDICATION: History of Goodpasture's syndrome, now in need of intravenous access for the initiation of plasmapheresis.  EXAM: TUNNELED CENTRAL VENOUS HEMODIALYSIS CATHETER PLACEMENT WITH ULTRASOUND AND FLUOROSCOPIC GUIDANCE  MEDICATIONS: Ancef 2 gm IV; The IV antibiotic was given in an appropriate time interval prior to skin puncture.  CONTRAST:  None  ANESTHESIA/SEDATION: Versed 3 mg IV; Fentanyl 75 mcg IV  Total Moderate Sedation Time  20 minutes.  FLUOROSCOPY TIME:  30 seconds.  COMPLICATIONS: None immediate  PROCEDURE: Informed written consent was obtained from the patient after a discussion of the risks, benefits, and alternatives to treatment. Questions regarding the procedure were encouraged and answered. The right neck and chest were prepped with chlorhexidine in a sterile fashion, and a sterile drape was applied covering the operative field. Maximum barrier sterile technique with sterile gowns and gloves were used for the procedure. A timeout was performed prior to the initiation of the procedure.  After creating a small venotomy incision, a micropuncture kit was utilized to access the right internal jugular vein under direct, real-time ultrasound guidance  after the overlying soft tissues were anesthetized with 1% lidocaine with epinephrine. Ultrasound image documentation was performed. The microwire was kinked to measure appropriate catheter length. A stiff Glidewire was advanced to the level of the IVC and the micropuncture sheath was exchanged for a peel-away sheath. A hemosplit tunneled hemodialysis catheter measuring 23 cm from tip to cuff was tunneled in a retrograde fashion from the anterior chest wall to the venotomy incision.  The catheter was then placed through the peel-away sheath with tips ultimately positioned within the superior aspect of the right atrium. Final catheter positioning was confirmed and documented with a spot radiographic image. The catheter aspirates and flushes normally. The catheter was flushed with appropriate volume heparin dwells.  The catheter exit site was secured with a 0-Prolene retention suture. The venotomy incision was closed with an interrupted 4-0 Vicryl, Dermabond and Steri-strips. Dressings were applied. The patient tolerated the procedure well without immediate post procedural complication.  IMPRESSION: Successful placement of 23 cm tip to cuff tunneled hemodialysis catheter via the right internal jugular vein with tips terminating within the superior aspect of the right atrium. The catheter is ready for immediate use.   Electronically Signed   By: Sandi Mariscal M.D.   On: 12/27/2013 16:17    Labs: BMET  Recent Labs Lab 12/23/13 0603 12/24/13 0415 12/25/13 0330 12/26/13 0428 12/27/13 0504 12/28/13 1138 12/29/13 0545  NA 134* 134* 138 140 138 138 140  K 4.8 4.2 4.4 4.6 4.0  3.4* 4.3  CL 102 105 106 109 103 99 104  CO2 $Re'20 19 20 22 23  'NAJ$ --  22  GLUCOSE 174* 157* 148* 105* 84 115* 137*  BUN 36* 42* 43* 47* 48* 38* 40*  CREATININE 1.56* 1.56* 1.68* 1.52* 1.65* 1.60* 1.54*  CALCIUM 9.3 8.7 9.2 8.9 9.0  --  8.9   CBC  Recent Labs Lab 12/26/13 0428 12/27/13 0504 12/28/13 1138 12/28/13 1146  12/29/13 0545  WBC 16.8* 16.5*  --  13.2* 13.3*  HGB 7.7* 7.8* 6.8* 7.4* 9.4*  HCT 23.0* 24.3* 20.0* 21.4* 27.2*  MCV 88.5 89.3  --  87.7 84.7  PLT 461* 449*  --  378 344    Medications:    . therapeutic plasma exchange solution   Dialysis Q1H  . calcium carbonate  2 tablet Oral Q3H  . calcium gluconate IVPB  4 g Intravenous Once  . citrate dextrose      . Cyclophosphamide  150 mg Oral Daily  . dutasteride  0.5 mg Oral Daily   And  . tamsulosin  0.4 mg Oral Daily  . enoxaparin (LOVENOX) injection  130 mg Subcutaneous Q24H  . ezetimibe-simvastatin  1 tablet Oral QHS  . heparin  1,000 Units Intracatheter Once  . latanoprost  1 drop Both Eyes Daily  . magnesium oxide  200 mg Oral QHS  . metoprolol tartrate  25 mg Oral BID  . montelukast  10 mg Oral QHS  . pantoprazole  40 mg Oral Daily  . predniSONE  60 mg Oral Q breakfast  . sodium chloride  3 mL Intravenous Q12H  . traMADol  50 mg Oral BID   Elmarie Shiley, MD 12/29/2013, 9:47 AM

## 2013-12-30 LAB — BASIC METABOLIC PANEL
BUN: 39 mg/dL — ABNORMAL HIGH (ref 6–23)
CHLORIDE: 107 meq/L (ref 96–112)
CO2: 18 meq/L — AB (ref 19–32)
Calcium: 8.8 mg/dL (ref 8.4–10.5)
Creatinine, Ser: 1.47 mg/dL — ABNORMAL HIGH (ref 0.50–1.35)
GFR calc non Af Amer: 46 mL/min — ABNORMAL LOW (ref >90)
GFR, EST AFRICAN AMERICAN: 53 mL/min — AB (ref >90)
Glucose, Bld: 113 mg/dL — ABNORMAL HIGH (ref 70–99)
Potassium: 4.4 mEq/L (ref 3.7–5.3)
SODIUM: 142 meq/L (ref 137–147)

## 2013-12-30 LAB — POCT I-STAT, CHEM 8
BUN: 37 mg/dL — AB (ref 6–23)
CHLORIDE: 107 meq/L (ref 96–112)
Calcium, Ion: 1.37 mmol/L — ABNORMAL HIGH (ref 1.13–1.30)
Creatinine, Ser: 1.6 mg/dL — ABNORMAL HIGH (ref 0.50–1.35)
Glucose, Bld: 170 mg/dL — ABNORMAL HIGH (ref 70–99)
HCT: 27 % — ABNORMAL LOW (ref 39.0–52.0)
Hemoglobin: 9.2 g/dL — ABNORMAL LOW (ref 13.0–17.0)
POTASSIUM: 3.4 meq/L — AB (ref 3.7–5.3)
SODIUM: 144 meq/L (ref 137–147)
TCO2: 18 mmol/L (ref 0–100)

## 2013-12-30 MED ORDER — FUROSEMIDE 20 MG PO TABS: 20.0000 mg | ORAL_TABLET | Freq: Every day | ORAL | Status: AC

## 2013-12-30 MED ORDER — FUROSEMIDE 10 MG/ML IJ SOLN
20.0000 mg | Freq: Once | INTRAMUSCULAR | Status: AC
  Administered 2013-12-30: 20 mg via INTRAVENOUS
  Filled 2013-12-30: qty 2

## 2013-12-30 MED ORDER — SODIUM CHLORIDE 0.9 % IV SOLN
INTRAVENOUS | Status: AC
  Administered 2013-12-30 (×6): via INTRAVENOUS_CENTRAL
  Filled 2013-12-30 (×6): qty 200

## 2013-12-30 MED ORDER — DIPHENHYDRAMINE HCL 25 MG PO CAPS: 25.0000 mg | ORAL_CAPSULE | Freq: Four times a day (QID) | ORAL | Status: DC | PRN

## 2013-12-30 MED ORDER — CALCIUM CARBONATE ANTACID 500 MG PO CHEW
2.0000 | CHEWABLE_TABLET | ORAL | Status: AC
  Administered 2013-12-30 (×2): 400 mg via ORAL
  Filled 2013-12-30: qty 2

## 2013-12-30 MED ORDER — ENOXAPARIN SODIUM 150 MG/ML ~~LOC~~ SOLN: 130.0000 mg | SUBCUTANEOUS | Status: DC

## 2013-12-30 MED ORDER — ACD FORMULA A 0.73-2.45-2.2 GM/100ML VI SOLN
Status: AC
  Administered 2013-12-30: 500 mL via INTRAVENOUS
  Filled 2013-12-30: qty 500

## 2013-12-30 MED ORDER — TRAMADOL HCL 50 MG PO TABS: 50.0000 mg | ORAL_TABLET | Freq: Two times a day (BID) | ORAL | Status: AC | PRN

## 2013-12-30 MED ORDER — ACD FORMULA A 0.73-2.45-2.2 GM/100ML VI SOLN
Status: AC
  Administered 2013-12-30: 500 mL
  Filled 2013-12-30: qty 500

## 2013-12-30 MED ORDER — SODIUM CHLORIDE 0.9 % IV SOLN
4.0000 g | Freq: Once | INTRAVENOUS | Status: AC
  Administered 2013-12-30: 4 g via INTRAVENOUS
  Filled 2013-12-30: qty 40

## 2013-12-30 MED ORDER — ACETAMINOPHEN 325 MG PO TABS: 650.0000 mg | ORAL_TABLET | ORAL | Status: DC | PRN

## 2013-12-30 MED ORDER — CALCIUM CARBONATE ANTACID 500 MG PO CHEW
CHEWABLE_TABLET | ORAL | Status: AC
  Administered 2013-12-30: 400 mg via ORAL
  Filled 2013-12-30: qty 4

## 2013-12-30 MED ORDER — PREDNISONE 20 MG PO TABS: 60.0000 mg | ORAL_TABLET | Freq: Every day | ORAL | Status: DC

## 2013-12-30 MED ORDER — ACD FORMULA A 0.73-2.45-2.2 GM/100ML VI SOLN
500.0000 mL | Status: DC
  Administered 2013-12-30: 500 mL via INTRAVENOUS

## 2013-12-30 MED ORDER — CYCLOPHOSPHAMIDE 25 MG PO CAPS: 150.0000 mg | ORAL_CAPSULE | Freq: Every day | ORAL | Status: DC

## 2013-12-30 MED ORDER — HEPARIN SODIUM (PORCINE) 1000 UNIT/ML IJ SOLN
1000.0000 [IU] | Freq: Once | INTRAMUSCULAR | Status: AC
  Administered 2013-12-30: 1000 [IU]
  Filled 2013-12-30: qty 1

## 2013-12-30 MED ORDER — SODIUM CHLORIDE 0.9 % IV SOLN
Freq: Once | INTRAVENOUS | Status: AC
  Administered 2013-12-30: 11:00:00 via INTRAVENOUS_CENTRAL
  Filled 2013-12-30: qty 200

## 2013-12-30 NOTE — Procedures (Signed)
Patient seen on PLEX. Doing well and without any complaints. Treatment adjusted as needed.  Elmarie Shiley MD [facility]. Office # [phone] Pager # [phone] 9:17 AM

## 2013-12-30 NOTE — Discharge Summary (Signed)
Physician Discharge Summary  Bryan Hubbard HKV:[id number] DOB: 27-Aug-1941 DOA: 12/18/2013  PCP: [name], MD  Admit date: 12/18/2013 Discharge date: 12/30/2013  Time spent: 35 minutes  Recommendations for Outpatient Follow-up:  1. Need to follow up with Dr Posey Pronto for further care. Need to continue with plasmapheresis.   Discharge Diagnoses:    Glomerulonephritis, Good pasteur syndrome.    CAP (community acquired pneumonia)   HYPERTENSION, BENIGN   Atrial fibrillation   DVT   Atrial flutter   Leukocytosis   Community acquired pneumonia   AKI (acute kidney injury)   Anemia of chronic disease   Thrombocytosis   Discharge Condition: stable.   Diet recommendation: heart Healthy  Filed Weights   12/29/13 0949 12/29/13 2015 12/30/13 0915  Weight: 86.1 kg (189 lb 13.1 oz) 86.5 kg (190 lb 11.2 oz) 86.6 kg (190 lb 14.7 oz)    History of present illness:  Bryan Hubbard is a 73 y.o. male who presents with a 2 week history of SOB. X ray on 3/19 showed B infiltrates. Patient started on levaquin. Despite that he was still SOB and so went back to his PCP today. PCP sent patient in to ED.  Earlier this morning patient also took $RemoveB'80mg'ShdYJnkp$  lasix, (normally takes $RemoveBeforeD'20mg'cqeIswOdSgtprS$  2 times a week), accidentally took 80 this morning when he was supposed to take 40 per his PCP. He has noted no peripheral edema. He does have a h/o diastolic CHF.  His SOB is worse with activity, better when lying down, slight improvement with the lasix.   Hospital Course:  Acute hypoxic respiratory failure suspect alveolar hemorrhage and combination of heart failure.  with bilateral pulmonary infiltrates on CXR. Previously treated as outpatient, but symptoms worsened. Legionella/S. pneumo from clinic were both negative. 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 Completed 7 days of treatment for community-acquired pneumonia.  -Patient developed Dyspnea on  4-02. He has JVD on physical exam. Dyspnea could be related to volume overload/  improved with lasix. Oxygen saturation at 100 % on 3 L. Patient oxygen saturation 97 on room air.  - V-Q scan, negative for PE.  - Glomerulonephritis, good pasteur S.  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  - Nephrology planning for at least 2 plasmapheresis treatments in the hospital prior to DC for OP plasmapheresis (Total 10-12 in 2 weeks)  -Continue with prednisone and Cyclophosphamide.   Normocytic anemia due to inflammation, hgb trending down slightly  - Iron studies consistent with inflammation, very elevated ferritin  - B12 wnl  - Folate wnl  - Occult stool negative  - received 2 units PRBC 4-2.   Aflutter - presently rate controlled. Cont beta blocker.  - He will be discharge on Lovenox. Due to questionable alveolar hemorrhage.  -After 2 weeks this will transition to coumadin. Discussed with Dr Posey Pronto.   Diastolic heart failure, chronic, pro-BNP elevated  - Daily weights (stable)  - Strict I/O  -stared on lasix.   Pleuritic right chest pain; resolved.  likely related to pleuritis. PE less likely as patient was therapeutic on warfarin at time that this occurred. Pain only occurrs with deep inspiration and movement.  - Telemetry demonstrated chronic a-flutter, rate controlled  - No evidence of ischemia on ECG  - ECHO: Ejection fraction of 55%, mild LVH, mild to moderate mitral regurgitation, right ventricle mildly to moderately dilated with mild to moderate reduced systolic function and peak PA pressure  of 45 mm mercury.  -VQ scan negative for PE.   BPH, continue avodart/flomax  - [place]: No obstruction   Leukocytosis, rose with initiation of steroids and now trending down. WBC stable.  Thrombocytosis, rising, acute phase reactant. Resolved.  Hyponatremia, resolved with IVF   Procedures: V-Q scan; Normal ventilation perfusion lung scan. No evidence of  pulmonary  embolism.    Consultations:  Dr Posey Pronto.   Discharge Exam: Filed Vitals:   12/30/13 1212  BP: 119/78  Pulse: 95  Temp: 98.1 F (36.7 C)  Resp: 16    General: No distress.  Cardiovascular: S 1, S 2 RRR Respiratory: CTA  Discharge Instructions You were cared for by a hospitalist during your hospital stay. If you have any questions about your discharge medications or the care you received while you were in the hospital after you are discharged, you can call the unit and asked to speak with the hospitalist on call if the hospitalist that took care of you is not available. Once you are discharged, your primary care physician will handle any further medical issues. Please note that NO REFILLS for any discharge medications will be authorized once you are discharged, as it is imperative that you return to your primary care physician (or establish a relationship with a primary care physician if you do not have one) for your aftercare needs so that they can reassess your need for medications and monitor your lab values.  Discharge Orders   Future Orders Complete By Expires   ([place]) Call MD:  Anytime you have any of the following symptoms: 1) 3 pound weight gain in 24 hours or 5 pounds in 1 week 2) shortness of breath, with or without a dry hacking cough 3) swelling in the hands, feet or stomach 4) if you have to sleep on extra pillows at night in order to breathe.  As directed    Call MD for:  difficulty breathing, headache or visual disturbances  As directed    Call MD for:  extreme fatigue  As directed    Call MD for:  hives  As directed    Call MD for:  persistant dizziness or light-headedness  As directed    Call MD for:  persistant nausea and vomiting  As directed    Call MD for:  redness, tenderness, or signs of infection (pain, swelling, redness, odor or green/yellow discharge around incision site)  As directed    Call MD for:  severe uncontrolled pain  As  directed    Call MD for:  temperature >100.4  As directed    Diet - low sodium heart healthy  As directed    Diet - low sodium heart healthy  As directed    Discharge instructions  As directed    Comments:     You were hospitalized with difficulty breathing and were initially thought to have pneumonia. 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 We are treating inflammation with cyclophosphamide and prednisone. Please take each of these medications every day. You will review the results of your kidney biopsy with Doctor Posey Pronto at your followup appointment in a few weeks.  Please take your Lasix every day for the next 7 days, then resume taking your Lasix twice a week as before.  Please stop your diclofenac, potassium, and quinapril as  these medications or not. If your kidneys are not working well.  He do not need to take your levofloxacin as you completed your antibiotics in the hospital. If you notice any increased swelling, blood in your urine, increased difficulty breathing, please return to the hospital.  Your INR is 1.5 to today. Please take Lovenox until your INR is above 2. Please have your primary care doctor repeat your INR on Friday.   Increase activity slowly  As directed    Increase activity slowly  As directed        Medication List    STOP taking these medications       CALCIUM + D3 PO     Coenzyme Q10 300 MG Caps     diclofenac 75 MG EC tablet  Commonly known as:  VOLTAREN     levofloxacin 750 MG tablet  Commonly known as:  LEVAQUIN     potassium chloride SA 20 MEQ tablet  Commonly known as:  K-DUR,KLOR-CON     quinapril 20 MG tablet  Commonly known as:  ACCUPRIL     warfarin 2 MG tablet  Commonly known as:  COUMADIN      TAKE these medications       bimatoprost 0.01 % Soln   Commonly known as:  LUMIGAN  Place 1 drop into both eyes every morning.     cyanocobalamin 1000 MCG/ML injection  Commonly known as:  (VITAMIN B-12)  Inject 1,000 mcg into the muscle every 30 (thirty) days.     Cyclophosphamide 25 MG Caps  Take 6 capsules (150 mg total) by mouth daily.     Dutasteride-Tamsulosin HCl 0.5-0.4 MG Caps  Take 1 capsule by mouth daily.     enoxaparin 150 MG/ML injection  Commonly known as:  LOVENOX  Inject 0.87 mLs (130 mg total) into the skin daily.     furosemide 20 MG tablet  Commonly known as:  LASIX  Take 1 tablet (20 mg total) by mouth daily.     Magnesium 250 MG Tabs  Take 1 tablet by mouth every evening.     metoprolol tartrate 25 MG tablet  Commonly known as:  LOPRESSOR  Take 25 mg by mouth 2 (two) times daily.     montelukast 10 MG tablet  Commonly known as:  SINGULAIR  Take 10 mg by mouth at bedtime.     multivitamin tablet  Take 1 tablet by mouth daily.     omeprazole 20 MG capsule  Commonly known as:  PRILOSEC  Take 20 mg by mouth daily.     predniSONE 20 MG tablet  Commonly known as:  DELTASONE  Take 3 tablets (60 mg total) by mouth daily with breakfast.     traMADol 50 MG tablet  Commonly known as:  ULTRAM  Take 1 tablet (50 mg total) by mouth every 12 (twelve) hours as needed.     vitamin C 500 MG tablet  Commonly known as:  ASCORBIC ACID  Take 500 mg by mouth daily.     VYTORIN 10-20 MG per tablet  Generic drug:  ezetimibe-simvastatin  Take 1 tablet by mouth at bedtime.       No Known Allergies     Follow-up Information   Follow up with [name], MD In 1 month.   Specialty:  Family Medicine   Contact information:   [address] [place] [address] [phone]       Follow up with Ulla Potash., MD On 01/08/2014. (1:30PM.  You will get appt reminder and lab request for 1 week before appt.  )    Specialty:  Nephrology   Contact information:   [address] [place] [address] [phone]       Follow up with [name], MD.   Specialty:  Family Medicine   Contact information:   [address] [place] [address] [phone]        The results of significant diagnostics from this hospitalization (including imaging, microbiology, ancillary and laboratory) are listed below for reference.    Significant Diagnostic Studies: Dg Chest 2 View  12/18/2013   CLINICAL DATA:  Shortness of breath.  EXAM: CHEST  2 VIEW  COMPARISON:  DG CHEST 2 VIEW dated 12/14/2013; DG CHEST 2V dated 04/02/2012; CT ABD-PELV WO/W CM (HEMATURIA) dated 08/14/2010  FINDINGS: Bilateral patchy airspace opacities are mildly improved. Mild cardiomegaly. Thoracic spondylosis. No pleural effusion.  IMPRESSION: 1. Cardiomegaly with bilateral improved airspace opacities suggesting improving edema or improving atypical pneumonia.   Electronically Signed   By: Sherryl Barters M.D.   On: 12/18/2013 19:55   Dg Chest 2 View  12/14/2013   CLINICAL DATA:  Short of breath, fatigue, cough for several weeks  EXAM: CHEST  2 VIEW  COMPARISON:  Chest x-ray of 04/02/2012  FINDINGS: There are patchy bilateral lung opacities most consistent with multifocal pneumonia. Followup chest x-ray is recommended to ensure clearing. No pleural effusion is seen. There is mild cardiomegaly present. Degenerative changes are noted throughout the thoracic spine.  IMPRESSION: Bilateral patchy lung opacities suspicious for multifocal pneumonia. Recommend followup.   Electronically Signed   By: Ivar Drape M.D.   On: 12/14/2013 15:11   US Renal  12/20/2013   CLINICAL DATA:  Acute kidney injury.  EXAM: RENAL/URINARY TRACT ULTRASOUND COMPLETE  COMPARISON:  CT ABD-PELV WO/W CM (HEMATURIA) dated 08/14/2010  FINDINGS: Right Kidney:  Length: 13.0 cm. Echogenicity within normal limits. No mass or hydronephrosis visualized.  Left Kidney:  Length: 12.8 cm. Echogenicity within normal limits. No  mass or hydronephrosis visualized.  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 Bladder is otherwise unremarkable in appearance.  IMPRESSION: 1. Unremarkable appearance of the kidneys.  No hydronephrosis. 2. Bladder diverticulum.   Electronically Signed   By: Logan Bores   On: 12/20/2013 18:39   Nm Pulmonary Perf And Vent  12/30/2013   CLINICAL DATA:  Shortness of breath. Hypoxemia. History of pulmonary embolism.  EXAM: NUCLEAR MEDICINE VENTILATION - PERFUSION LUNG SCAN  TECHNIQUE: Ventilation images were obtained in multiple projections using inhaled aerosol technetium 99 M DTPA. Perfusion images were obtained in multiple projections after intravenous injection of Tc-82m MAA.  RADIOPHARMACEUTICALS:  40.0 mCi Tc-13m DTPA aerosol and 6.0 mCi Tc-60m MAA  COMPARISON:  Chest x-ray dated 12/29/2013  FINDINGS: Ventilation: No focal ventilation defect.  Perfusion: No wedge shaped peripheral perfusion defects to suggest acute pulmonary embolism.  IMPRESSION: Normal ventilation perfusion lung scan. No evidence of pulmonary embolism.   Electronically Signed   By: Rozetta Nunnery M.D.   On: 12/30/2013 08:49   US Biopsy  12/25/2013   INDICATION: Renal insufficiency, concern for glomerulonephritis  EXAM: ULTRASOUND GUIDED RENAL BIOPSY  COMPARISON:  US RENAL dated 12/20/2013; CT ABD-PELV WO/W CM (HEMATURIA) dated 08/14/2010  MEDICATIONS: Fentanyl 100 mcg IV; Versed 2 mg IV  ANESTHESIA/SEDATION: Total Moderate Sedation time  10 minutes  COMPLICATIONS: None immediate  PROCEDURE: Informed written consent was obtained from the  patient after a discussion of the risks, benefits and alternatives to treatment. The patient understands and consents the procedure. A timeout was performed prior to the initiation of the procedure.  Ultrasound scanning was performed of the bilateral flanks. The inferior pole of the right kidney was selected for biopsy due to location and sonographic window. The procedure was  planned. The operative site was prepped and draped in the usual sterile fashion. The overlying soft tissues were anesthetized with 1% lidocaine with epinephrine. A 17 gauge core needle biopsy device was advanced into the inferior cortex of the right kidney and 2 core biopsies were obtained under direct ultrasound guidance. Real time pathologic review confirmed adequate tissue acquisition. Images were saved for documentation purposes. The biopsy device was removed and hemostasis was obtained with manual compression. Post procedural scanning was negative for significant post procedural hemorrhage or additional complication. A dressing was placed. The patient tolerated the procedure well without immediate post procedural complication.  IMPRESSION: Technically successful ultrasound guided right renal biopsy.   Electronically Signed   By: Sandi Mariscal M.D.   On: 12/25/2013 15:17   Ir Fluoro Guide Cv Line Right  12/27/2013   INDICATION: History of Goodpasture's syndrome, now in need of intravenous access for the initiation of plasmapheresis.  EXAM: TUNNELED CENTRAL VENOUS HEMODIALYSIS CATHETER PLACEMENT WITH ULTRASOUND AND FLUOROSCOPIC GUIDANCE  MEDICATIONS: Ancef 2 gm IV; The IV antibiotic was given in an appropriate time interval prior to skin puncture.  CONTRAST:  None  ANESTHESIA/SEDATION: Versed 3 mg IV; Fentanyl 75 mcg IV  Total Moderate Sedation Time  20 minutes.  FLUOROSCOPY TIME:  30 seconds.  COMPLICATIONS: None immediate  PROCEDURE: Informed written consent was obtained from the patient after a discussion of the risks, benefits, and alternatives to treatment. Questions regarding the procedure were encouraged and answered. The right neck and chest were prepped with chlorhexidine in a sterile fashion, and a sterile drape was applied covering the operative field. Maximum barrier sterile technique with sterile gowns and gloves were used for the procedure. A timeout was performed prior to the initiation of the  procedure.  After creating a small venotomy incision, a micropuncture kit was utilized to access the right internal jugular vein under direct, real-time ultrasound guidance after the overlying soft tissues were anesthetized with 1% lidocaine with epinephrine. Ultrasound image documentation was performed. The microwire was kinked to measure appropriate catheter length. A stiff Glidewire was advanced to the level of the IVC and the micropuncture sheath was exchanged for a peel-away sheath. A hemosplit tunneled hemodialysis catheter measuring 23 cm from tip to cuff was tunneled in a retrograde fashion from the anterior chest wall to the venotomy incision.  The catheter was then placed through the peel-away sheath with tips ultimately positioned within the superior aspect of the right atrium. Final catheter positioning was confirmed and documented with a spot radiographic image. The catheter aspirates and flushes normally. The catheter was flushed with appropriate volume heparin dwells.  The catheter exit site was secured with a 0-Prolene retention suture. The venotomy incision was closed with an interrupted 4-0 Vicryl, Dermabond and Steri-strips. Dressings were applied. The patient tolerated the procedure well without immediate post procedural complication.  IMPRESSION: Successful placement of 23 cm tip to cuff tunneled hemodialysis catheter via the right internal jugular vein with tips terminating within the superior aspect of the right atrium. The catheter is ready for immediate use.   Electronically Signed   By: Sandi Mariscal M.D.   On: 12/27/2013 16:17  Ir US Guide Vasc Access Right  12/27/2013   INDICATION: History of Goodpasture's syndrome, now in need of intravenous access for the initiation of plasmapheresis.  EXAM: TUNNELED CENTRAL VENOUS HEMODIALYSIS CATHETER PLACEMENT WITH ULTRASOUND AND FLUOROSCOPIC GUIDANCE  MEDICATIONS: Ancef 2 gm IV; The IV antibiotic was given in an appropriate time interval prior to  skin puncture.  CONTRAST:  None  ANESTHESIA/SEDATION: Versed 3 mg IV; Fentanyl 75 mcg IV  Total Moderate Sedation Time  20 minutes.  FLUOROSCOPY TIME:  30 seconds.  COMPLICATIONS: None immediate  PROCEDURE: Informed written consent was obtained from the patient after a discussion of the risks, benefits, and alternatives to treatment. Questions regarding the procedure were encouraged and answered. The right neck and chest were prepped with chlorhexidine in a sterile fashion, and a sterile drape was applied covering the operative field. Maximum barrier sterile technique with sterile gowns and gloves were used for the procedure. A timeout was performed prior to the initiation of the procedure.  After creating a small venotomy incision, a micropuncture kit was utilized to access the right internal jugular vein under direct, real-time ultrasound guidance after the overlying soft tissues were anesthetized with 1% lidocaine with epinephrine. Ultrasound image documentation was performed. The microwire was kinked to measure appropriate catheter length. A stiff Glidewire was advanced to the level of the IVC and the micropuncture sheath was exchanged for a peel-away sheath. A hemosplit tunneled hemodialysis catheter measuring 23 cm from tip to cuff was tunneled in a retrograde fashion from the anterior chest wall to the venotomy incision.  The catheter was then placed through the peel-away sheath with tips ultimately positioned within the superior aspect of the right atrium. Final catheter positioning was confirmed and documented with a spot radiographic image. The catheter aspirates and flushes normally. The catheter was flushed with appropriate volume heparin dwells.  The catheter exit site was secured with a 0-Prolene retention suture. The venotomy incision was closed with an interrupted 4-0 Vicryl, Dermabond and Steri-strips. Dressings were applied. The patient tolerated the procedure well without immediate post procedural  complication.  IMPRESSION: Successful placement of 23 cm tip to cuff tunneled hemodialysis catheter via the right internal jugular vein with tips terminating within the superior aspect of the right atrium. The catheter is ready for immediate use.   Electronically Signed   By: Sandi Mariscal M.D.   On: 12/27/2013 16:17   Dg Chest Port 1 View  12/29/2013   CLINICAL DATA:  Short of breath.  On plasmapheresis.  EXAM: PORTABLE CHEST - 1 VIEW  COMPARISON:  12/19/2013  FINDINGS: Cardiac silhouette is mildly enlarged. Normal mediastinal and hilar contours. As noted previously, there is bilateral interstitial thickening. No focal consolidation. No pleural effusion or pneumothorax.  Right internal jugular dual-lumen central venous catheter has its distal tip the caval atrial junction, new from the prior exam.  IMPRESSION: 1. Interstitial prominence described previously is similar, again raising the possibility of bronchiolitis. Mild interstitial edema should be considered in the proper clinical setting. No focal consolidation to suggest lobar pneumonia. 2. Stable cardiomegaly. 3. New right internal jugular dual-lumen central venous catheter is well positioned. No pneumothorax.   Electronically Signed   By: Lajean Manes M.D.   On: 12/29/2013 10:17   Dg Chest Port 1 View  12/19/2013   CLINICAL DATA:  Shortness of breath.  EXAM: PORTABLE CHEST - 1 VIEW  COMPARISON:  Chest x-ray 12/18/2013.  FINDINGS: Diffuse interstitial prominence and peribronchial cuffing, with patchy ill-defined airspace opacities, similar to  recent prior examinations but new compared to prior study from 04/22/2012. No pleural effusions. No evidence of pulmonary edema. Heart size appears borderline enlarged, likely accentuated by portable AP technique. Upper mediastinal contours are within normal limits.  IMPRESSION: 1. The appearance of the chest is again suggestive of bronchitis with multilobar bronchopneumonia.   Electronically Signed   By: Vinnie Langton M.D.   On: 12/19/2013 06:36    Microbiology: No results found for this or any previous visit (from the past 240 hour(s)).   Labs: Basic Metabolic Panel:  Recent Labs Lab 12/25/13 0330 12/26/13 0428 12/27/13 0504 12/28/13 1138 12/29/13 0545 12/29/13 1026 12/30/13 0508  NA 138 140 138 138 140 142 142  K 4.4 4.6 4.0 3.4* 4.3 3.1* 4.4  CL 106 109 103 99 104 104 107  CO2 $Re'20 22 23  'aBS$ --  22  --  18*  GLUCOSE 148* 105* 84 115* 137* 164* 113*  BUN 43* 47* 48* 38* 40* 37* 39*  CREATININE 1.68* 1.52* 1.65* 1.60* 1.54* 1.70* 1.47*  CALCIUM 9.2 8.9 9.0  --  8.9  --  8.8   Liver Function Tests: No results found for this basename: AST, ALT, ALKPHOS, BILITOT, PROT, ALBUMIN,  in the last 168 hours No results found for this basename: LIPASE, AMYLASE,  in the last 168 hours No results found for this basename: AMMONIA,  in the last 168 hours CBC:  Recent Labs Lab 12/25/13 0330 12/26/13 0428 12/27/13 0504 12/28/13 1138 12/28/13 1146 12/29/13 0545 12/29/13 1026  WBC 22.4* 16.8* 16.5*  --  13.2* 13.3*  --   HGB 7.7* 7.7* 7.8* 6.8* 7.4* 9.4* 9.2*  HCT 24.1* 23.0* 24.3* 20.0* 21.4* 27.2* 27.0*  MCV 89.6 88.5 89.3  --  87.7 84.7  --   PLT 485* 461* 449*  --  378 344  --    Cardiac Enzymes: No results found for this basename: CKTOTAL, CKMB, CKMBINDEX, TROPONINI,  in the last 168 hours BNP: BNP (last 3 results)  Recent Labs  12/18/13 1635  PROBNP 5296.0*   CBG: No results found for this basename: GLUCAP,  in the last 168 hours     Signed:  Niel Hummer A  [facility] 12/30/2013, 1:43 PM

## 2013-12-30 NOTE — Progress Notes (Signed)
Patient ID: Bryan Hubbard, male   DOB: 1941/08/29, 73 y.o.   MRN: [id number]   [facility] Progress Note    Assessment/ Plan:   1 ARF: With Goodpastures disease. Started on corticosteroid/cyclophosphamide and now plasmapheresis-3rd treatment today. 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 He does not have hemoptysis.  2 Anemia : Status post packed red cell transfusion Thursday with appropriate correction of hemoglobin.  3 HTN- blood pressures well controlled at this time, edema noted on pedal exam-started oral furosemide  4 Dyspnea/hypoxia: Likely from volume excess- VQ scan negative.  Subjective:   Reports to be feeling well today- had some shortness of breath last night- improved with oxygen   Objective:   BP 122/81  Pulse 51  Temp(Src) 98.6 F (37 C) (Oral)  Resp 18  Ht 5' 10.5" (1.791 m)  Wt 86.5 kg (190 lb 11.2 oz)  BMI 26.97 kg/m2  SpO2 97%  Intake/Output Summary (Last 24 hours) at 12/30/13 0902 Last data filed at 12/30/13 0847  Gross per 24 hour  Intake    680 ml  Output   3250 ml  Net  -2570 ml   Weight change: 0.7 kg (1 lb 8.7 oz)  Physical Exam: GYI:[id number] resting in bed- on PLEX KKX:FGHWE RRR, normal s1 and s2 Resp:CTA bilaterally, no rales/rhonchi XHB:ZJIR, flat, NT, Bs normal CVE:LFYBO LE edema  Imaging: Nm Pulmonary Perf And Vent  12/30/2013   CLINICAL DATA:  Shortness of breath. Hypoxemia. History of pulmonary embolism.  EXAM: NUCLEAR MEDICINE VENTILATION - PERFUSION LUNG SCAN  TECHNIQUE: Ventilation images were obtained in multiple projections using inhaled aerosol technetium 99 M DTPA. Perfusion images were obtained in multiple projections after intravenous injection of Tc-20m MAA.  RADIOPHARMACEUTICALS:  40.0 mCi Tc-57m DTPA aerosol and 6.0 mCi Tc-34m MAA  COMPARISON:  Chest x-ray dated 12/29/2013  FINDINGS: Ventilation: No focal ventilation  defect.  Perfusion: No wedge shaped peripheral perfusion defects to suggest acute pulmonary embolism.  IMPRESSION: Normal ventilation perfusion lung scan. No evidence of pulmonary embolism.   Electronically Signed   By: Rozetta Nunnery M.D.   On: 12/30/2013 08:49   Dg Chest Port 1 View  12/29/2013   CLINICAL DATA:  Short of breath.  On plasmapheresis.  EXAM: PORTABLE CHEST - 1 VIEW  COMPARISON:  12/19/2013  FINDINGS: Cardiac silhouette is mildly enlarged. Normal mediastinal and hilar contours. As noted previously, there is bilateral interstitial thickening. No focal consolidation. No pleural effusion or pneumothorax.  Right internal jugular dual-lumen central venous catheter has its distal tip the caval atrial junction, new from the prior exam.  IMPRESSION: 1. Interstitial prominence described previously is similar, again raising the possibility of bronchiolitis. Mild interstitial edema should be considered in the proper clinical setting. No focal consolidation to suggest lobar pneumonia. 2. Stable cardiomegaly. 3. New right internal jugular dual-lumen central venous catheter is well positioned. No pneumothorax.   Electronically Signed   By: Lajean Manes M.D.   On: 12/29/2013 10:17    Labs: BMET  Recent Labs Lab 12/24/13 0415 12/25/13 0330 12/26/13 0428 12/27/13 0504 12/28/13 1138 12/29/13 0545 12/29/13 1026 12/30/13 0508  NA 134* 138 140 138 138 140 142 142  K 4.2 4.4 4.6 4.0 3.4* 4.3 3.1* 4.4  CL 105 106 109 103 99 104 104 107  CO2 19 20 22 23   --  22  --  18*  GLUCOSE 157* 148* 105*  84 115* 137* 164* 113*  BUN 42* 43* 47* 48* 38* 40* 37* 39*  CREATININE 1.56* 1.68* 1.52* 1.65* 1.60* 1.54* 1.70* 1.47*  CALCIUM 8.7 9.2 8.9 9.0  --  8.9  --  8.8   CBC  Recent Labs Lab 12/26/13 0428 12/27/13 0504 12/28/13 1138 12/28/13 1146 12/29/13 0545 12/29/13 1026  WBC 16.8* 16.5*  --  13.2* 13.3*  --   HGB 7.7* 7.8* 6.8* 7.4* 9.4* 9.2*  HCT 23.0* 24.3* 20.0* 21.4* 27.2* 27.0*  MCV 88.5 89.3   --  87.7 84.7  --   PLT 461* 449*  --  378 344  --    Medications:    . therapeutic plasma exchange solution   Dialysis Q1 Hr x 6  . therapeutic plasma exchange solution   Dialysis Once in dialysis  . calcium gluconate IVPB  4 g Intravenous Once  . citrate dextrose      . Cyclophosphamide  150 mg Oral Daily  . dutasteride  0.5 mg Oral Daily   And  . tamsulosin  0.4 mg Oral Daily  . enoxaparin (LOVENOX) injection  130 mg Subcutaneous Q24H  . ezetimibe-simvastatin  1 tablet Oral QHS  . furosemide  20 mg Oral Daily  . latanoprost  1 drop Both Eyes Daily  . magnesium oxide  200 mg Oral QHS  . metoprolol tartrate  25 mg Oral BID  . montelukast  10 mg Oral QHS  . pantoprazole  40 mg Oral Daily  . potassium chloride  20 mEq Oral BID  . predniSONE  60 mg Oral Q breakfast  . sodium chloride  3 mL Intravenous Q12H  . traMADol  50 mg Oral BID   Elmarie Shiley, MD 12/30/2013, 9:02 AM

## 2013-12-30 NOTE — Progress Notes (Signed)
Patient taken out of hospital via wheelchair. Patient going home with wife who is in car waiting on patient.

## 2013-12-30 NOTE — Progress Notes (Signed)
Patient 96% on room air while at rest. Patient ambulated in hall on room air with saturations staying above 92%. Dr. Tyrell Antonio notified.

## 2013-12-30 NOTE — Progress Notes (Signed)
TPE- Pt tolerating procedure well. VS stable. Pt will return for exchange Monday at 9am and begin M-W-F-S schedule for Dr. Posey Pronto with a 1.0 volume exchange (4L). Also please draw anti-GBM on Wednesday per Dr. Posey Pronto. Appointment confirmed with wife and instructions as well as phone number to dialysis unit given to patient.

## 2013-12-30 NOTE — Progress Notes (Addendum)
Discharge instructions dicussed with pt. Prescriptions for Lasix, Prednisone, Ultram, Lovenox and Cyclophosphamide given to patient. Pt has been on Lovenox injections prior and able to properly state how to administer. Lovenox kit in room. Wife will be administering who is at bedside and also able to state proper injections. Patient instructed on follow up appt, diet and activity. Patient verbally understands instructions.

## 2014-01-01 ENCOUNTER — Non-Acute Institutional Stay (HOSPITAL_COMMUNITY)
Admission: AD | Admit: 2014-01-01 | Discharge: 2014-01-01 | Disposition: A | Discharge status: Home / Self Care | Payer: Medicare Other | Source: Ambulatory Visit | Attending: Nephrology | Admitting: Nephrology

## 2014-01-01 DIAGNOSIS — N059 Unspecified nephritic syndrome with unspecified morphologic changes: Secondary | ICD-10-CM | POA: Insufficient documentation

## 2014-01-01 LAB — CBC
HEMATOCRIT: 25.6 % — AB (ref 39.0–52.0)
HEMOGLOBIN: 8.7 g/dL — AB (ref 13.0–17.0)
MCH: 28.9 pg (ref 26.0–34.0)
MCHC: 34 g/dL (ref 30.0–36.0)
MCV: 85 fL (ref 78.0–100.0)
Platelets: 277 10*3/uL (ref 150–400)
RBC: 3.01 MIL/uL — ABNORMAL LOW (ref 4.22–5.81)
RDW: 16.3 % — ABNORMAL HIGH (ref 11.5–15.5)
WBC: 11.9 10*3/uL — AB (ref 4.0–10.5)

## 2014-01-01 LAB — RETICULOCYTES
RBC.: 3.01 MIL/uL — ABNORMAL LOW (ref 4.22–5.81)
RETIC COUNT ABSOLUTE: 30.1 10*3/uL (ref 19.0–186.0)
Retic Ct Pct: 1 % (ref 0.4–3.1)

## 2014-01-01 MED ORDER — HEPARIN SODIUM (PORCINE) 1000 UNIT/ML IJ SOLN
1000.0000 [IU] | Freq: Once | INTRAMUSCULAR | Status: AC
  Administered 2014-01-01: 1000 [IU]

## 2014-01-01 MED ORDER — SODIUM CHLORIDE 0.9 % IV SOLN
INTRAVENOUS | Status: AC
  Administered 2014-01-01 (×4): via INTRAVENOUS_CENTRAL
  Filled 2014-01-01 (×4): qty 200

## 2014-01-01 MED ORDER — ACD FORMULA A 0.73-2.45-2.2 GM/100ML VI SOLN
Status: AC
  Administered 2014-01-01: 11:00:00
  Filled 2014-01-01: qty 500

## 2014-01-01 MED ORDER — ACD FORMULA A 0.73-2.45-2.2 GM/100ML VI SOLN: 500.0000 mL | Status: DC

## 2014-01-01 MED ORDER — CALCIUM CARBONATE ANTACID 500 MG PO CHEW: 2.0000 | CHEWABLE_TABLET | ORAL | Status: DC

## 2014-01-01 MED ORDER — DIPHENHYDRAMINE HCL 25 MG PO CAPS: 25.0000 mg | ORAL_CAPSULE | Freq: Four times a day (QID) | ORAL | Status: DC | PRN

## 2014-01-01 MED ORDER — ACETAMINOPHEN 325 MG PO TABS: 650.0000 mg | ORAL_TABLET | ORAL | Status: DC | PRN

## 2014-01-01 MED ORDER — ACD FORMULA A 0.73-2.45-2.2 GM/100ML VI SOLN
Status: AC
  Administered 2014-01-01: 12:00:00
  Filled 2014-01-01: qty 500

## 2014-01-01 MED ORDER — SODIUM CHLORIDE 0.9 % IV SOLN
4.0000 g | Freq: Once | INTRAVENOUS | Status: AC
  Administered 2014-01-01: 4 g via INTRAVENOUS
  Filled 2014-01-01 (×3): qty 40

## 2014-01-01 NOTE — Progress Notes (Signed)
TPE complete without adverse events.  Vital signs stable and pt is currently without complaint.  Pt being discharged to home.  Pt aware of next appointment on Wednesday, January 03, 2014.

## 2014-01-02 LAB — POCT I-STAT, CHEM 8
BUN: 38 mg/dL — AB (ref 6–23)
CHLORIDE: 104 meq/L (ref 96–112)
Calcium, Ion: 1.33 mmol/L — ABNORMAL HIGH (ref 1.13–1.30)
Creatinine, Ser: 1.6 mg/dL — ABNORMAL HIGH (ref 0.50–1.35)
Glucose, Bld: 92 mg/dL (ref 70–99)
HCT: 26 % — ABNORMAL LOW (ref 39.0–52.0)
Hemoglobin: 8.8 g/dL — ABNORMAL LOW (ref 13.0–17.0)
Potassium: 3.9 mEq/L (ref 3.7–5.3)
Sodium: 141 mEq/L (ref 137–147)
TCO2: 21 mmol/L (ref 0–100)

## 2014-01-03 ENCOUNTER — Non-Acute Institutional Stay (HOSPITAL_COMMUNITY)
Admission: AD | Admit: 2014-01-03 | Discharge: 2014-01-03 | Disposition: A | Discharge status: Home / Self Care | Payer: Medicare Other | Source: Ambulatory Visit | Attending: Nephrology | Admitting: Nephrology

## 2014-01-03 DIAGNOSIS — N059 Unspecified nephritic syndrome with unspecified morphologic changes: Secondary | ICD-10-CM | POA: Insufficient documentation

## 2014-01-03 LAB — CBC
HCT: 23.9 % — ABNORMAL LOW (ref 39.0–52.0)
HEMOGLOBIN: 8.1 g/dL — AB (ref 13.0–17.0)
MCH: 28.7 pg (ref 26.0–34.0)
MCHC: 33.9 g/dL (ref 30.0–36.0)
MCV: 84.8 fL (ref 78.0–100.0)
Platelets: 241 10*3/uL (ref 150–400)
RBC: 2.82 MIL/uL — ABNORMAL LOW (ref 4.22–5.81)
RDW: 16.5 % — AB (ref 11.5–15.5)
WBC: 9.3 10*3/uL (ref 4.0–10.5)

## 2014-01-03 LAB — POCT I-STAT, CHEM 8
BUN: 35 mg/dL — ABNORMAL HIGH (ref 6–23)
CHLORIDE: 103 meq/L (ref 96–112)
Calcium, Ion: 1.28 mmol/L (ref 1.13–1.30)
Creatinine, Ser: 1.8 mg/dL — ABNORMAL HIGH (ref 0.50–1.35)
Glucose, Bld: 171 mg/dL — ABNORMAL HIGH (ref 70–99)
HEMATOCRIT: 25 % — AB (ref 39.0–52.0)
Hemoglobin: 8.5 g/dL — ABNORMAL LOW (ref 13.0–17.0)
POTASSIUM: 3.2 meq/L — AB (ref 3.7–5.3)
SODIUM: 141 meq/L (ref 137–147)
TCO2: 21 mmol/L (ref 0–100)

## 2014-01-03 MED ORDER — ACETAMINOPHEN 325 MG PO TABS: 650.0000 mg | ORAL_TABLET | ORAL | Status: DC | PRN

## 2014-01-03 MED ORDER — DIPHENHYDRAMINE HCL 25 MG PO CAPS: 25.0000 mg | ORAL_CAPSULE | Freq: Four times a day (QID) | ORAL | Status: DC | PRN

## 2014-01-03 MED ORDER — ACD FORMULA A 0.73-2.45-2.2 GM/100ML VI SOLN
Status: AC
  Administered 2014-01-03: 10:00:00
  Filled 2014-01-03: qty 500

## 2014-01-03 MED ORDER — ALBUMIN HUMAN 25 % IV SOLN
INTRAVENOUS | Status: AC
  Administered 2014-01-03 (×4): via INTRAVENOUS_CENTRAL
  Filled 2014-01-03 (×4): qty 200

## 2014-01-03 MED ORDER — ACD FORMULA A 0.73-2.45-2.2 GM/100ML VI SOLN
500.0000 mL | Status: DC
  Administered 2014-01-03: 500 mL via INTRAVENOUS

## 2014-01-03 MED ORDER — HEPARIN SODIUM (PORCINE) 1000 UNIT/ML IJ SOLN: 1000.0000 [IU] | Freq: Once | INTRAMUSCULAR | Status: DC

## 2014-01-03 MED ORDER — ACD FORMULA A 0.73-2.45-2.2 GM/100ML VI SOLN
Status: AC
  Filled 2014-01-03: qty 500

## 2014-01-03 MED ORDER — SODIUM CHLORIDE 0.9 % IV SOLN
4.0000 g | Freq: Once | INTRAVENOUS | Status: AC
  Administered 2014-01-03: 4 g via INTRAVENOUS
  Filled 2014-01-03: qty 40

## 2014-01-03 MED ORDER — CALCIUM CARBONATE ANTACID 500 MG PO CHEW: 2.0000 | CHEWABLE_TABLET | ORAL | Status: DC

## 2014-01-03 NOTE — Progress Notes (Signed)
Pt had completed TPE #5 without adverse events.  Vital signs stable and pt is without complaint.  Pt is aware of next treatment scheduled on Friday, January 05, 2014.  Pt discharged to home.

## 2014-01-04 ENCOUNTER — Encounter (HOSPITAL_COMMUNITY): Payer: Self-pay

## 2014-01-04 LAB — GLOMERULAR BASEMENT MEMBRANE ANTIBODIES: GBM AB: 2.2 — AB

## 2014-01-05 ENCOUNTER — Non-Acute Institutional Stay (HOSPITAL_COMMUNITY)
Admission: AD | Admit: 2014-01-05 | Discharge: 2014-01-05 | Disposition: A | Discharge status: Home / Self Care | Payer: Medicare Other | Source: Ambulatory Visit | Attending: Nephrology | Admitting: Nephrology

## 2014-01-05 DIAGNOSIS — N058 Unspecified nephritic syndrome with other morphologic changes: Secondary | ICD-10-CM | POA: Insufficient documentation

## 2014-01-05 DIAGNOSIS — M310 Hypersensitivity angiitis: Secondary | ICD-10-CM | POA: Insufficient documentation

## 2014-01-05 LAB — COMPREHENSIVE METABOLIC PANEL
ALK PHOS: 29 U/L — AB (ref 39–117)
ALT: 39 U/L (ref 0–53)
AST: 29 U/L (ref 0–37)
Albumin: 4.4 g/dL (ref 3.5–5.2)
BUN: 33 mg/dL — ABNORMAL HIGH (ref 6–23)
CHLORIDE: 105 meq/L (ref 96–112)
CO2: 21 meq/L (ref 19–32)
Calcium: 9.1 mg/dL (ref 8.4–10.5)
Creatinine, Ser: 1.73 mg/dL — ABNORMAL HIGH (ref 0.50–1.35)
GFR, EST AFRICAN AMERICAN: 43 mL/min — AB (ref >90)
GFR, EST NON AFRICAN AMERICAN: 37 mL/min — AB (ref >90)
GLUCOSE: 131 mg/dL — AB (ref 70–99)
POTASSIUM: 3.7 meq/L (ref 3.7–5.3)
SODIUM: 142 meq/L (ref 137–147)
TOTAL PROTEIN: 5.6 g/dL — AB (ref 6.0–8.3)
Total Bilirubin: 0.9 mg/dL (ref 0.3–1.2)

## 2014-01-05 LAB — CBC
HCT: 23.9 % — ABNORMAL LOW (ref 39.0–52.0)
HEMOGLOBIN: 8.1 g/dL — AB (ref 13.0–17.0)
MCH: 28.8 pg (ref 26.0–34.0)
MCHC: 33.9 g/dL (ref 30.0–36.0)
MCV: 85.1 fL (ref 78.0–100.0)
Platelets: 263 10*3/uL (ref 150–400)
RBC: 2.81 MIL/uL — AB (ref 4.22–5.81)
RDW: 16.8 % — ABNORMAL HIGH (ref 11.5–15.5)
WBC: 10.3 10*3/uL (ref 4.0–10.5)

## 2014-01-05 MED ORDER — SODIUM CHLORIDE 0.9 % IV SOLN
INTRAVENOUS | Status: AC
  Administered 2014-01-05 (×4): via INTRAVENOUS_CENTRAL
  Filled 2014-01-05 (×4): qty 200

## 2014-01-05 MED ORDER — SODIUM CHLORIDE 0.9 % IV SOLN
4.0000 g | Freq: Once | INTRAVENOUS | Status: AC
  Administered 2014-01-05: 4 g via INTRAVENOUS
  Filled 2014-01-05: qty 40

## 2014-01-05 MED ORDER — ACD FORMULA A 0.73-2.45-2.2 GM/100ML VI SOLN
Status: AC
  Administered 2014-01-05: 500 mL
  Filled 2014-01-05: qty 500

## 2014-01-05 MED ORDER — HEPARIN SODIUM (PORCINE) 1000 UNIT/ML IJ SOLN
1000.0000 [IU] | Freq: Once | INTRAMUSCULAR | Status: AC
  Administered 2014-01-05: 1000 [IU]

## 2014-01-05 MED ORDER — CALCIUM CARBONATE ANTACID 500 MG PO CHEW
2.0000 | CHEWABLE_TABLET | ORAL | Status: AC
  Administered 2014-01-05 (×2): 400 mg via ORAL

## 2014-01-05 MED ORDER — CALCIUM CARBONATE ANTACID 500 MG PO CHEW
CHEWABLE_TABLET | ORAL | Status: AC
  Administered 2014-01-05: 400 mg via ORAL
  Filled 2014-01-05: qty 4

## 2014-01-05 MED ORDER — ACD FORMULA A 0.73-2.45-2.2 GM/100ML VI SOLN: 500.0000 mL | Status: DC

## 2014-01-05 MED ORDER — DIPHENHYDRAMINE HCL 25 MG PO CAPS: 25.0000 mg | ORAL_CAPSULE | Freq: Four times a day (QID) | ORAL | Status: DC | PRN

## 2014-01-05 MED ORDER — ACETAMINOPHEN 325 MG PO TABS
650.0000 mg | ORAL_TABLET | ORAL | Status: DC | PRN
Start: 2014-01-05 — End: 2014-01-05

## 2014-01-05 NOTE — Progress Notes (Signed)
TPE #6 completed without issue. Pt is to return tomorrow Saturday 10th at 0830am. Pt discharged to home in stable condition

## 2014-01-06 ENCOUNTER — Non-Acute Institutional Stay (HOSPITAL_COMMUNITY)
Admission: AD | Admit: 2014-01-06 | Discharge: 2014-01-06 | Disposition: A | Discharge status: Home / Self Care | Payer: Medicare Other | Source: Ambulatory Visit | Attending: Nephrology | Admitting: Nephrology

## 2014-01-06 DIAGNOSIS — M310 Hypersensitivity angiitis: Secondary | ICD-10-CM | POA: Insufficient documentation

## 2014-01-06 DIAGNOSIS — N058 Unspecified nephritic syndrome with other morphologic changes: Secondary | ICD-10-CM | POA: Insufficient documentation

## 2014-01-06 LAB — CBC WITH DIFFERENTIAL/PLATELET
Basophils Absolute: 0 10*3/uL (ref 0.0–0.1)
Basophils Relative: 0 % (ref 0–1)
EOS ABS: 0 10*3/uL (ref 0.0–0.7)
EOS PCT: 0 % (ref 0–5)
HCT: 22.9 % — ABNORMAL LOW (ref 39.0–52.0)
HEMOGLOBIN: 7.8 g/dL — AB (ref 13.0–17.0)
LYMPHS ABS: 0.9 10*3/uL (ref 0.7–4.0)
Lymphocytes Relative: 10 % — ABNORMAL LOW (ref 12–46)
MCH: 29 pg (ref 26.0–34.0)
MCHC: 34.1 g/dL (ref 30.0–36.0)
MCV: 85.1 fL (ref 78.0–100.0)
MONOS PCT: 11 % (ref 3–12)
Monocytes Absolute: 1 10*3/uL (ref 0.1–1.0)
Neutro Abs: 7.2 10*3/uL (ref 1.7–7.7)
Neutrophils Relative %: 79 % — ABNORMAL HIGH (ref 43–77)
Platelets: 225 10*3/uL (ref 150–400)
RBC: 2.69 MIL/uL — ABNORMAL LOW (ref 4.22–5.81)
RDW: 17 % — ABNORMAL HIGH (ref 11.5–15.5)
WBC: 9 10*3/uL (ref 4.0–10.5)

## 2014-01-06 LAB — COMPREHENSIVE METABOLIC PANEL
ALBUMIN: 4.8 g/dL (ref 3.5–5.2)
ALT: 34 U/L (ref 0–53)
AST: 29 U/L (ref 0–37)
Alkaline Phosphatase: 23 U/L — ABNORMAL LOW (ref 39–117)
BUN: 35 mg/dL — AB (ref 6–23)
CALCIUM: 9.2 mg/dL (ref 8.4–10.5)
CO2: 23 mEq/L (ref 19–32)
CREATININE: 1.78 mg/dL — AB (ref 0.50–1.35)
Chloride: 102 mEq/L (ref 96–112)
GFR calc non Af Amer: 36 mL/min — ABNORMAL LOW (ref >90)
GFR, EST AFRICAN AMERICAN: 42 mL/min — AB (ref >90)
GLUCOSE: 113 mg/dL — AB (ref 70–99)
POTASSIUM: 3.8 meq/L (ref 3.7–5.3)
Sodium: 140 mEq/L (ref 137–147)
TOTAL PROTEIN: 5.5 g/dL — AB (ref 6.0–8.3)
Total Bilirubin: 0.9 mg/dL (ref 0.3–1.2)

## 2014-01-06 MED ORDER — CALCIUM CARBONATE ANTACID 500 MG PO CHEW
CHEWABLE_TABLET | ORAL | Status: AC
  Administered 2014-01-06: 400 mg via ORAL
  Filled 2014-01-06: qty 4

## 2014-01-06 MED ORDER — SODIUM CHLORIDE 0.9 % IV SOLN
INTRAVENOUS | Status: DC
  Administered 2014-01-06: 09:00:00 via INTRAVENOUS_CENTRAL
  Filled 2014-01-06 (×4): qty 200

## 2014-01-06 MED ORDER — DIPHENHYDRAMINE HCL 25 MG PO CAPS: 25.0000 mg | ORAL_CAPSULE | Freq: Four times a day (QID) | ORAL | Status: DC | PRN

## 2014-01-06 MED ORDER — SODIUM CHLORIDE 0.9 % IV SOLN
4.0000 g | Freq: Once | INTRAVENOUS | Status: AC
  Administered 2014-01-06: 4 g via INTRAVENOUS
  Filled 2014-01-06: qty 40

## 2014-01-06 MED ORDER — ACETAMINOPHEN 325 MG PO TABS: 650.0000 mg | ORAL_TABLET | ORAL | Status: DC | PRN

## 2014-01-06 MED ORDER — CALCIUM CARBONATE ANTACID 500 MG PO CHEW
2.0000 | CHEWABLE_TABLET | ORAL | Status: DC
  Administered 2014-01-06: 400 mg via ORAL

## 2014-01-06 MED ORDER — ACD FORMULA A 0.73-2.45-2.2 GM/100ML VI SOLN
Status: AC
  Administered 2014-01-06: 500 mL via INTRAVENOUS
  Filled 2014-01-06: qty 500

## 2014-01-06 MED ORDER — ACD FORMULA A 0.73-2.45-2.2 GM/100ML VI SOLN
500.0000 mL | Status: DC
Start: 2014-01-06 — End: 2014-01-06
  Administered 2014-01-06: 500 mL via INTRAVENOUS

## 2014-01-06 MED ORDER — HEPARIN SODIUM (PORCINE) 1000 UNIT/ML IJ SOLN: 1000.0000 [IU] | Freq: Once | INTRAMUSCULAR | Status: DC

## 2014-01-06 NOTE — Progress Notes (Signed)
TPE- Completed without issue. Next week will be last 4 tx. Dr. Posey Pronto called today d/t dropping hbg, results 7.8 today. He would like to have him get 2 units of PRBC's Monday. Will call Short Stay Monday to see when this can be set up. Type and Cross done to help facilitate today. Will call Short Stay early Monday and set up. If can get in early will have TPE afterwards, if not will follow up with Dr. Posey Pronto on what we should do. Pt discharged in stable condition.

## 2014-01-08 ENCOUNTER — Other Ambulatory Visit (HOSPITAL_COMMUNITY): Payer: Self-pay | Admitting: *Deleted

## 2014-01-08 LAB — POCT I-STAT, CHEM 8
BUN: 33 mg/dL — ABNORMAL HIGH (ref 6–23)
Calcium, Ion: 1.28 mmol/L (ref 1.13–1.30)
Chloride: 103 mEq/L (ref 96–112)
Creatinine, Ser: 1.9 mg/dL — ABNORMAL HIGH (ref 0.50–1.35)
Glucose, Bld: 113 mg/dL — ABNORMAL HIGH (ref 70–99)
HCT: 23 % — ABNORMAL LOW (ref 39.0–52.0)
HEMOGLOBIN: 7.8 g/dL — AB (ref 13.0–17.0)
Potassium: 3.6 mEq/L — ABNORMAL LOW (ref 3.7–5.3)
SODIUM: 141 meq/L (ref 137–147)
TCO2: 24 mmol/L (ref 0–100)

## 2014-01-09 ENCOUNTER — Other Ambulatory Visit (HOSPITAL_COMMUNITY): Payer: Self-pay | Admitting: *Deleted

## 2014-01-09 ENCOUNTER — Ambulatory Visit (HOSPITAL_COMMUNITY)
Admission: RE | Admit: 2014-01-09 | Discharge: 2014-01-09 | Disposition: A | Discharge status: Home / Self Care | Payer: Medicare Other | Source: Ambulatory Visit | Attending: Nephrology | Admitting: Nephrology

## 2014-01-09 DIAGNOSIS — D649 Anemia, unspecified: Secondary | ICD-10-CM | POA: Insufficient documentation

## 2014-01-09 LAB — PREPARE RBC (CROSSMATCH)

## 2014-01-10 ENCOUNTER — Non-Acute Institutional Stay (HOSPITAL_COMMUNITY)
Admission: AD | Admit: 2014-01-10 | Discharge: 2014-01-10 | Disposition: A | Discharge status: Home / Self Care | Payer: Medicare Other | Source: Ambulatory Visit | Attending: Nephrology | Admitting: Nephrology

## 2014-01-10 DIAGNOSIS — N058 Unspecified nephritic syndrome with other morphologic changes: Secondary | ICD-10-CM | POA: Insufficient documentation

## 2014-01-10 DIAGNOSIS — M310 Hypersensitivity angiitis: Secondary | ICD-10-CM | POA: Insufficient documentation

## 2014-01-10 LAB — COMPREHENSIVE METABOLIC PANEL
ALT: 37 U/L (ref 0–53)
AST: 26 U/L (ref 0–37)
Albumin: 4.2 g/dL (ref 3.5–5.2)
Alkaline Phosphatase: 31 U/L — ABNORMAL LOW (ref 39–117)
BILIRUBIN TOTAL: 1.1 mg/dL (ref 0.3–1.2)
BUN: 40 mg/dL — AB (ref 6–23)
CHLORIDE: 101 meq/L (ref 96–112)
CO2: 25 mEq/L (ref 19–32)
Calcium: 9 mg/dL (ref 8.4–10.5)
Creatinine, Ser: 1.71 mg/dL — ABNORMAL HIGH (ref 0.50–1.35)
GFR calc Af Amer: 44 mL/min — ABNORMAL LOW (ref >90)
GFR calc non Af Amer: 38 mL/min — ABNORMAL LOW (ref >90)
Glucose, Bld: 122 mg/dL — ABNORMAL HIGH (ref 70–99)
Potassium: 4.2 mEq/L (ref 3.7–5.3)
Sodium: 141 mEq/L (ref 137–147)
TOTAL PROTEIN: 5.4 g/dL — AB (ref 6.0–8.3)

## 2014-01-10 LAB — TYPE AND SCREEN
ABO/RH(D): A NEG
ANTIBODY SCREEN: NEGATIVE
UNIT DIVISION: 0
Unit division: 0

## 2014-01-10 LAB — CBC
HEMATOCRIT: 28.2 % — AB (ref 39.0–52.0)
Hemoglobin: 9.7 g/dL — ABNORMAL LOW (ref 13.0–17.0)
MCH: 29.7 pg (ref 26.0–34.0)
MCHC: 34.4 g/dL (ref 30.0–36.0)
MCV: 86.2 fL (ref 78.0–100.0)
Platelets: 199 10*3/uL (ref 150–400)
RBC: 3.27 MIL/uL — ABNORMAL LOW (ref 4.22–5.81)
RDW: 17.5 % — AB (ref 11.5–15.5)
WBC: 7.3 10*3/uL (ref 4.0–10.5)

## 2014-01-10 MED ORDER — ACD FORMULA A 0.73-2.45-2.2 GM/100ML VI SOLN
Status: AC
  Administered 2014-01-10: 500 mL via INTRAVENOUS
  Filled 2014-01-10: qty 500

## 2014-01-10 MED ORDER — CALCIUM CARBONATE ANTACID 500 MG PO CHEW
CHEWABLE_TABLET | ORAL | Status: AC
  Administered 2014-01-10: 400 mg via ORAL
  Filled 2014-01-10: qty 4

## 2014-01-10 MED ORDER — SODIUM CHLORIDE 0.9 % IV SOLN
4.0000 g | Freq: Once | INTRAVENOUS | Status: AC
  Administered 2014-01-10: 4 g via INTRAVENOUS
  Filled 2014-01-10: qty 40

## 2014-01-10 MED ORDER — DIPHENHYDRAMINE HCL 25 MG PO CAPS: 25.0000 mg | ORAL_CAPSULE | Freq: Four times a day (QID) | ORAL | Status: DC | PRN

## 2014-01-10 MED ORDER — CALCIUM CARBONATE ANTACID 500 MG PO CHEW
2.0000 | CHEWABLE_TABLET | ORAL | Status: AC
  Administered 2014-01-10 (×2): 400 mg via ORAL

## 2014-01-10 MED ORDER — ACD FORMULA A 0.73-2.45-2.2 GM/100ML VI SOLN
500.0000 mL | Status: DC
  Administered 2014-01-10: 500 mL via INTRAVENOUS

## 2014-01-10 MED ORDER — ACETAMINOPHEN 325 MG PO TABS: 650.0000 mg | ORAL_TABLET | ORAL | Status: DC | PRN

## 2014-01-10 MED ORDER — SODIUM CHLORIDE 0.9 % IV SOLN
INTRAVENOUS | Status: AC
  Administered 2014-01-10 (×4): via INTRAVENOUS_CENTRAL
  Filled 2014-01-10 (×4): qty 200

## 2014-01-10 MED ORDER — HEPARIN SODIUM (PORCINE) 1000 UNIT/ML IJ SOLN
1000.0000 [IU] | Freq: Once | INTRAMUSCULAR | Status: AC
Start: 2014-01-10 — End: 2014-01-10
  Administered 2014-01-10: 1000 [IU]

## 2014-01-10 NOTE — Progress Notes (Signed)
TPE- Pt tolerating well. Feels better after transfusion. Hgb 9.7 today. Will continue treatments Friday, Saturday, and Monday at 0830am. Verified with pt.

## 2014-01-11 LAB — POCT I-STAT, CHEM 8
BUN: 36 mg/dL — ABNORMAL HIGH (ref 6–23)
Calcium, Ion: 1.25 mmol/L (ref 1.13–1.30)
Chloride: 101 mEq/L (ref 96–112)
Creatinine, Ser: 1.9 mg/dL — ABNORMAL HIGH (ref 0.50–1.35)
Glucose, Bld: 123 mg/dL — ABNORMAL HIGH (ref 70–99)
HCT: 28 % — ABNORMAL LOW (ref 39.0–52.0)
HEMOGLOBIN: 9.5 g/dL — AB (ref 13.0–17.0)
POTASSIUM: 4.1 meq/L (ref 3.7–5.3)
SODIUM: 140 meq/L (ref 137–147)
TCO2: 24 mmol/L (ref 0–100)

## 2014-01-12 ENCOUNTER — Non-Acute Institutional Stay (HOSPITAL_COMMUNITY)
Admission: AD | Admit: 2014-01-12 | Discharge: 2014-01-12 | Disposition: A | Discharge status: Home / Self Care | Payer: Medicare Other | Source: Ambulatory Visit | Attending: Nephrology | Admitting: Nephrology

## 2014-01-12 DIAGNOSIS — N058 Unspecified nephritic syndrome with other morphologic changes: Secondary | ICD-10-CM | POA: Insufficient documentation

## 2014-01-12 DIAGNOSIS — M310 Hypersensitivity angiitis: Secondary | ICD-10-CM | POA: Insufficient documentation

## 2014-01-12 LAB — COMPREHENSIVE METABOLIC PANEL
ALK PHOS: 29 U/L — AB (ref 39–117)
ALT: 33 U/L (ref 0–53)
AST: 29 U/L (ref 0–37)
Albumin: 4.4 g/dL (ref 3.5–5.2)
BILIRUBIN TOTAL: 0.8 mg/dL (ref 0.3–1.2)
BUN: 41 mg/dL — ABNORMAL HIGH (ref 6–23)
CHLORIDE: 105 meq/L (ref 96–112)
CO2: 23 meq/L (ref 19–32)
CREATININE: 1.72 mg/dL — AB (ref 0.50–1.35)
Calcium: 9.4 mg/dL (ref 8.4–10.5)
GFR calc Af Amer: 44 mL/min — ABNORMAL LOW (ref >90)
GFR, EST NON AFRICAN AMERICAN: 38 mL/min — AB (ref >90)
Glucose, Bld: 134 mg/dL — ABNORMAL HIGH (ref 70–99)
Potassium: 4.2 mEq/L (ref 3.7–5.3)
Sodium: 142 mEq/L (ref 137–147)
Total Protein: 5.4 g/dL — ABNORMAL LOW (ref 6.0–8.3)

## 2014-01-12 LAB — CBC WITH DIFFERENTIAL/PLATELET
Basophils Absolute: 0 10*3/uL (ref 0.0–0.1)
Basophils Relative: 0 % (ref 0–1)
Eosinophils Absolute: 0 10*3/uL (ref 0.0–0.7)
Eosinophils Relative: 0 % (ref 0–5)
HEMATOCRIT: 28.3 % — AB (ref 39.0–52.0)
HEMOGLOBIN: 9.4 g/dL — AB (ref 13.0–17.0)
LYMPHS PCT: 8 % — AB (ref 12–46)
Lymphs Abs: 0.4 10*3/uL — ABNORMAL LOW (ref 0.7–4.0)
MCH: 29.4 pg (ref 26.0–34.0)
MCHC: 33.2 g/dL (ref 30.0–36.0)
MCV: 88.4 fL (ref 78.0–100.0)
MONO ABS: 0.5 10*3/uL (ref 0.1–1.0)
Monocytes Relative: 9 % (ref 3–12)
Neutro Abs: 4.4 10*3/uL (ref 1.7–7.7)
Neutrophils Relative %: 83 % — ABNORMAL HIGH (ref 43–77)
Platelets: 187 10*3/uL (ref 150–400)
RBC: 3.2 MIL/uL — ABNORMAL LOW (ref 4.22–5.81)
RDW: 18.1 % — ABNORMAL HIGH (ref 11.5–15.5)
WBC: 5.3 10*3/uL (ref 4.0–10.5)

## 2014-01-12 MED ORDER — CALCIUM CARBONATE ANTACID 500 MG PO CHEW
2.0000 | CHEWABLE_TABLET | ORAL | Status: AC
  Administered 2014-01-12 (×2): 400 mg via ORAL

## 2014-01-12 MED ORDER — HEPARIN SODIUM (PORCINE) 1000 UNIT/ML IJ SOLN
1000.0000 [IU] | Freq: Once | INTRAMUSCULAR | Status: AC
  Administered 2014-01-12: 1000 [IU]

## 2014-01-12 MED ORDER — SODIUM CHLORIDE 0.9 % IV SOLN
4.0000 g | Freq: Once | INTRAVENOUS | Status: AC
  Administered 2014-01-12: 4 g via INTRAVENOUS
  Filled 2014-01-12 (×2): qty 40

## 2014-01-12 MED ORDER — CALCIUM CARBONATE ANTACID 500 MG PO CHEW
CHEWABLE_TABLET | ORAL | Status: AC
  Administered 2014-01-12: 400 mg via ORAL
  Filled 2014-01-12: qty 4

## 2014-01-12 MED ORDER — ACD FORMULA A 0.73-2.45-2.2 GM/100ML VI SOLN
500.0000 mL | Status: DC
  Administered 2014-01-12: 500 mL via INTRAVENOUS

## 2014-01-12 MED ORDER — ACETAMINOPHEN 325 MG PO TABS: 650.0000 mg | ORAL_TABLET | ORAL | Status: DC | PRN

## 2014-01-12 MED ORDER — DIPHENHYDRAMINE HCL 25 MG PO CAPS: 25.0000 mg | ORAL_CAPSULE | Freq: Four times a day (QID) | ORAL | Status: DC | PRN

## 2014-01-12 MED ORDER — ACD FORMULA A 0.73-2.45-2.2 GM/100ML VI SOLN
Status: AC
  Administered 2014-01-12: 500 mL via INTRAVENOUS
  Filled 2014-01-12: qty 500

## 2014-01-12 MED ORDER — ALBUMIN HUMAN 25 % IV SOLN
INTRAVENOUS | Status: AC
  Administered 2014-01-12 (×4): via INTRAVENOUS_CENTRAL
  Filled 2014-01-12 (×5): qty 200

## 2014-01-12 NOTE — Progress Notes (Signed)
TPE- tolerating well without issue. Says he had a "good checkup" yesterday. Will schedule last two tx for tomorrow 4/18 and Monday 4/20 at 0830AM. Pt confirmed.

## 2014-01-13 ENCOUNTER — Non-Acute Institutional Stay (HOSPITAL_COMMUNITY)
Admission: RE | Admit: 2014-01-13 | Discharge: 2014-01-13 | Disposition: A | Discharge status: Home / Self Care | Payer: Medicare Other | Source: Other Acute Inpatient Hospital | Attending: Nephrology | Admitting: Nephrology

## 2014-01-13 DIAGNOSIS — M310 Hypersensitivity angiitis: Secondary | ICD-10-CM | POA: Insufficient documentation

## 2014-01-13 DIAGNOSIS — N058 Unspecified nephritic syndrome with other morphologic changes: Secondary | ICD-10-CM | POA: Insufficient documentation

## 2014-01-13 LAB — COMPREHENSIVE METABOLIC PANEL
ALBUMIN: 4.7 g/dL (ref 3.5–5.2)
ALT: 27 U/L (ref 0–53)
AST: 23 U/L (ref 0–37)
Alkaline Phosphatase: 20 U/L — ABNORMAL LOW (ref 39–117)
BUN: 39 mg/dL — ABNORMAL HIGH (ref 6–23)
CALCIUM: 9.2 mg/dL (ref 8.4–10.5)
CO2: 22 mEq/L (ref 19–32)
Chloride: 105 mEq/L (ref 96–112)
Creatinine, Ser: 1.75 mg/dL — ABNORMAL HIGH (ref 0.50–1.35)
GFR calc Af Amer: 43 mL/min — ABNORMAL LOW (ref >90)
GFR calc non Af Amer: 37 mL/min — ABNORMAL LOW (ref >90)
Glucose, Bld: 137 mg/dL — ABNORMAL HIGH (ref 70–99)
Potassium: 4.3 mEq/L (ref 3.7–5.3)
SODIUM: 141 meq/L (ref 137–147)
TOTAL PROTEIN: 5.5 g/dL — AB (ref 6.0–8.3)
Total Bilirubin: 0.8 mg/dL (ref 0.3–1.2)

## 2014-01-13 LAB — CBC
HCT: 28.1 % — ABNORMAL LOW (ref 39.0–52.0)
Hemoglobin: 9.5 g/dL — ABNORMAL LOW (ref 13.0–17.0)
MCH: 29.6 pg (ref 26.0–34.0)
MCHC: 33.8 g/dL (ref 30.0–36.0)
MCV: 87.5 fL (ref 78.0–100.0)
PLATELETS: 168 10*3/uL (ref 150–400)
RBC: 3.21 MIL/uL — ABNORMAL LOW (ref 4.22–5.81)
RDW: 18.2 % — AB (ref 11.5–15.5)
WBC: 5.8 10*3/uL (ref 4.0–10.5)

## 2014-01-13 MED ORDER — ACD FORMULA A 0.73-2.45-2.2 GM/100ML VI SOLN
Status: AC
  Administered 2014-01-13: 500 mL via INTRAVENOUS
  Filled 2014-01-13: qty 500

## 2014-01-13 MED ORDER — CALCIUM CARBONATE ANTACID 500 MG PO CHEW
CHEWABLE_TABLET | ORAL | Status: AC
  Administered 2014-01-13: 400 mg via ORAL
  Filled 2014-01-13: qty 4

## 2014-01-13 MED ORDER — SODIUM CHLORIDE 0.9 % IV SOLN
INTRAVENOUS | Status: DC
  Administered 2014-01-13 (×2): via INTRAVENOUS_CENTRAL
  Filled 2014-01-13 (×4): qty 200

## 2014-01-13 MED ORDER — ACETAMINOPHEN 325 MG PO TABS: 650.0000 mg | ORAL_TABLET | ORAL | Status: DC | PRN

## 2014-01-13 MED ORDER — ACD FORMULA A 0.73-2.45-2.2 GM/100ML VI SOLN
500.0000 mL | Status: DC
  Administered 2014-01-13: 500 mL via INTRAVENOUS
  Filled 2014-01-13: qty 500

## 2014-01-13 MED ORDER — DIPHENHYDRAMINE HCL 25 MG PO CAPS: 25.0000 mg | ORAL_CAPSULE | Freq: Four times a day (QID) | ORAL | Status: DC | PRN

## 2014-01-13 MED ORDER — CALCIUM CARBONATE ANTACID 500 MG PO CHEW
2.0000 | CHEWABLE_TABLET | ORAL | Status: DC
  Administered 2014-01-13: 400 mg via ORAL

## 2014-01-13 MED ORDER — CALCIUM GLUCONATE 10 % IV SOLN
4.0000 g | Freq: Once | INTRAVENOUS | Status: AC
  Administered 2014-01-13: 4 g via INTRAVENOUS
  Filled 2014-01-13: qty 40

## 2014-01-13 MED ORDER — HEPARIN SODIUM (PORCINE) 1000 UNIT/ML IJ SOLN
1000.0000 [IU] | Freq: Once | INTRAMUSCULAR | Status: AC
  Administered 2014-01-13: 1000 [IU]

## 2014-01-13 NOTE — Progress Notes (Signed)
TPE- tolerating tx well with no issues. Vitals stable. Pt is for last tx Monday 4/20 at 0830am. Will then follow up with MD. Please call unit if further treatments are required. Thank you

## 2014-01-15 ENCOUNTER — Non-Acute Institutional Stay (HOSPITAL_COMMUNITY)
Admission: AD | Admit: 2014-01-15 | Discharge: 2014-01-15 | Disposition: A | Discharge status: Home / Self Care | Payer: Medicare Other | Source: Ambulatory Visit | Attending: Nephrology | Admitting: Nephrology

## 2014-01-15 DIAGNOSIS — M310 Hypersensitivity angiitis: Secondary | ICD-10-CM | POA: Insufficient documentation

## 2014-01-15 DIAGNOSIS — N058 Unspecified nephritic syndrome with other morphologic changes: Secondary | ICD-10-CM | POA: Insufficient documentation

## 2014-01-15 LAB — RETICULOCYTES
RBC.: 3.21 MIL/uL — ABNORMAL LOW (ref 4.22–5.81)
Retic Count, Absolute: 51.4 10*3/uL (ref 19.0–186.0)
Retic Ct Pct: 1.6 % (ref 0.4–3.1)

## 2014-01-15 LAB — CBC
HEMATOCRIT: 28.6 % — AB (ref 39.0–52.0)
HEMOGLOBIN: 9.7 g/dL — AB (ref 13.0–17.0)
MCH: 30.2 pg (ref 26.0–34.0)
MCHC: 33.9 g/dL (ref 30.0–36.0)
MCV: 89.1 fL (ref 78.0–100.0)
Platelets: 176 10*3/uL (ref 150–400)
RBC: 3.21 MIL/uL — ABNORMAL LOW (ref 4.22–5.81)
RDW: 18.9 % — ABNORMAL HIGH (ref 11.5–15.5)
WBC: 5.1 10*3/uL (ref 4.0–10.5)

## 2014-01-15 LAB — POCT I-STAT, CHEM 8
BUN: 38 mg/dL — AB (ref 6–23)
BUN: 35 mg/dL — AB (ref 6–23)
BUN: 41 mg/dL — AB (ref 6–23)
CHLORIDE: 101 meq/L (ref 96–112)
CHLORIDE: 104 meq/L (ref 96–112)
CHLORIDE: 103 meq/L (ref 96–112)
CREATININE: 1.9 mg/dL — AB (ref 0.50–1.35)
CREATININE: 1.9 mg/dL — AB (ref 0.50–1.35)
Calcium, Ion: 1.31 mmol/L — ABNORMAL HIGH (ref 1.13–1.30)
Calcium, Ion: 1.29 mmol/L (ref 1.13–1.30)
Calcium, Ion: 1.33 mmol/L — ABNORMAL HIGH (ref 1.13–1.30)
Creatinine, Ser: 1.9 mg/dL — ABNORMAL HIGH (ref 0.50–1.35)
GLUCOSE: 136 mg/dL — AB (ref 70–99)
GLUCOSE: 152 mg/dL — AB (ref 70–99)
Glucose, Bld: 135 mg/dL — ABNORMAL HIGH (ref 70–99)
HCT: 29 % — ABNORMAL LOW (ref 39.0–52.0)
HCT: 29 % — ABNORMAL LOW (ref 39.0–52.0)
HCT: 29 % — ABNORMAL LOW (ref 39.0–52.0)
Hemoglobin: 9.9 g/dL — ABNORMAL LOW (ref 13.0–17.0)
Hemoglobin: 9.9 g/dL — ABNORMAL LOW (ref 13.0–17.0)
Hemoglobin: 9.9 g/dL — ABNORMAL LOW (ref 13.0–17.0)
POTASSIUM: 4 meq/L (ref 3.7–5.3)
POTASSIUM: 4.1 meq/L (ref 3.7–5.3)
Potassium: 4 mEq/L (ref 3.7–5.3)
Sodium: 141 mEq/L (ref 137–147)
Sodium: 142 mEq/L (ref 137–147)
Sodium: 139 mEq/L (ref 137–147)
TCO2: 23 mmol/L (ref 0–100)
TCO2: 21 mmol/L (ref 0–100)
TCO2: 21 mmol/L (ref 0–100)

## 2014-01-15 MED ORDER — HEPARIN SODIUM (PORCINE) 1000 UNIT/ML IJ SOLN: 1000.0000 [IU] | Freq: Once | INTRAMUSCULAR | Status: DC

## 2014-01-15 MED ORDER — ACD FORMULA A 0.73-2.45-2.2 GM/100ML VI SOLN
Status: AC
  Administered 2014-01-15: 09:00:00
  Filled 2014-01-15: qty 500

## 2014-01-15 MED ORDER — ACD FORMULA A 0.73-2.45-2.2 GM/100ML VI SOLN: 500.0000 mL | Status: DC

## 2014-01-15 MED ORDER — CALCIUM CARBONATE ANTACID 500 MG PO CHEW
2.0000 | CHEWABLE_TABLET | ORAL | Status: DC
  Administered 2014-01-15: 400 mg via ORAL

## 2014-01-15 MED ORDER — CALCIUM GLUCONATE 10 % IV SOLN
4.0000 g | Freq: Once | INTRAVENOUS | Status: AC
  Administered 2014-01-15: 4 g via INTRAVENOUS
  Filled 2014-01-15: qty 40

## 2014-01-15 MED ORDER — SODIUM CHLORIDE 0.9 % IV SOLN
INTRAVENOUS | Status: AC
  Administered 2014-01-15 (×4): via INTRAVENOUS_CENTRAL
  Filled 2014-01-15 (×4): qty 200

## 2014-01-15 MED ORDER — ACD FORMULA A 0.73-2.45-2.2 GM/100ML VI SOLN
Status: AC
  Administered 2014-01-15: 11:00:00
  Filled 2014-01-15: qty 500

## 2014-01-15 MED ORDER — DIPHENHYDRAMINE HCL 25 MG PO CAPS: 25.0000 mg | ORAL_CAPSULE | Freq: Four times a day (QID) | ORAL | Status: DC | PRN

## 2014-01-15 MED ORDER — ACETAMINOPHEN 325 MG PO TABS: 650.0000 mg | ORAL_TABLET | ORAL | Status: DC | PRN

## 2014-01-15 MED ORDER — CALCIUM CARBONATE ANTACID 500 MG PO CHEW
CHEWABLE_TABLET | ORAL | Status: AC
  Filled 2014-01-15: qty 2

## 2014-01-15 NOTE — Progress Notes (Signed)
TPE complete without adverse events.  Vital signs stable and pt without complaint.  Order received from Dr. Graylon Gunning to continue treatment x4 more and to draw GBM antibody on Friday, January 19, 2014.  Last treatment is scheduled for Monday, January 22, 2014 as of now.  Pt aware of new order and pt next appointment scheduled for Wednesday April 22,2015.  Pt discharged to home without complaint.

## 2014-01-16 NOTE — H&P (Signed)
For 01/15/14  Bryan Hubbard is an 73 y.o. male.    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 He was started on oral therapy with cyclophosphamide and prednisone after initial pulse therapy with Solu-Medrol. He currently on plasma pheresis due to detectable anti-GBM antibody.  Past Medical History  Diagnosis Date  . Atrial fibrillation   . Chronic diastolic heart failure   . DVT   . HYPERLIPIDEMIA   . HYPERTENSION, BENIGN   . ALLERGIC ASTHMA   . Palpitations     Past Surgical History  Procedure Laterality Date  . Bone marrow biopsy      Family History  Problem Relation Age of Onset  . Stroke Mother   . Heart attack Maternal Grandmother    Social History:  reports that he has quit smoking. He does not have any smokeless tobacco history on file. His alcohol and drug histories are not on file.  Allergies: No Known Allergies  No prescriptions prior to admission    No results found for this or any previous visit (from the past 48 hour(s)). No results found.  Review of Systems  Constitutional: Positive for malaise/fatigue.  Respiratory: Positive for shortness of breath. Negative for cough, hemoptysis, sputum production and wheezing.   Cardiovascular: Positive for leg swelling.  Gastrointestinal: Negative.   Genitourinary: Negative.     Blood pressure 132/68, pulse 96, temperature 98 F (36.7 C), temperature source Oral, resp. rate 20, weight 82.645 kg (182 lb 3.2 oz),  SpO2 100.00%. Physical Exam  Nursing note and vitals reviewed.    Assessment/Plan:  1. Pulmonary renal syndrome, Goodpasture's: On oral cyclophosphamide, prednisone and receiving plasmapheresis with periodic lab checks to evaluate for packed red cell transfusion as needed. At his last visit to the clinic, anti-GBM titers were still detectable-the decision made to continue plasmapheresis for another 4 treatments. 2. Volume overload: Improving gradually with diuretic therapy-adjust as outpatient and continue recommendations of low sodium diet. 3. 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

## 2014-01-16 NOTE — H&P (Signed)
Bryan Hubbard is an 73 y.o. Caucasian male.    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 He was started on oral therapy with cyclophosphamide and prednisone after initial pulse therapy with Solu-Medrol. He currently on plasma pheresis due to detectable anti-GBM antibody.  Past Medical History  Diagnosis Date  . Atrial fibrillation   . Chronic diastolic heart failure   . DVT   . HYPERLIPIDEMIA   . HYPERTENSION, BENIGN   . ALLERGIC ASTHMA   . Palpitations     Past Surgical History  Procedure Laterality Date  . Bone marrow biopsy      Family History  Problem Relation Age of Onset  . Stroke Mother   . Heart attack Maternal Grandmother    Social History:  reports that he has quit smoking. He does not have any smokeless tobacco history on file. His alcohol and drug histories are not on file.  Allergies: No Known Allergies  No prescriptions prior to admission    No results found for this or any previous visit (from the past 48 hour(s)). No results found.  Review of Systems  Constitutional: Positive for malaise/fatigue.  Respiratory: Positive for shortness of breath. Negative for cough, hemoptysis, sputum production and wheezing.   Cardiovascular: Positive for leg swelling.  Gastrointestinal: Negative.   Genitourinary: Negative.     Blood pressure 132/68, pulse 96, temperature 98 F (36.7 C), temperature source Oral, resp. rate 20, weight 82.645 kg (182 lb 3.2 oz), SpO2  100.00%. Physical Exam  Nursing note and vitals reviewed.    Assessment/Plan:  1. Pulmonary renal syndrome, Goodpasture's: On oral cyclophosphamide, prednisone and receiving plasmapheresis with periodic lab checks to evaluate for packed red cell transfusion as needed. 2. Volume overload: Improving gradually with diuretic therapy-adjust as outpatient and continue recommendations of low sodium diet. 3. 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

## 2014-01-16 NOTE — H&P (Signed)
Bryan Hubbard is an 73 y.o. male.    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 He was started on oral therapy with cyclophosphamide and prednisone after initial pulse therapy with Solu-Medrol. He currently on plasma pheresis due to detectable anti-GBM antibody.  Past Medical History  Diagnosis Date  . Atrial fibrillation   . Chronic diastolic heart failure   . DVT   . HYPERLIPIDEMIA   . HYPERTENSION, BENIGN   . ALLERGIC ASTHMA   . Palpitations     Past Surgical History  Procedure Laterality Date  . Bone marrow biopsy      Family History  Problem Relation Age of Onset  . Stroke Mother   . Heart attack Maternal Grandmother    Social History:  reports that he has quit smoking. He does not have any smokeless tobacco history on file. His alcohol and drug histories are not on file.  Allergies: No Known Allergies  No prescriptions prior to admission    No results found for this or any previous visit (from the past 48 hour(s)). No results found.  Review of Systems  Constitutional: Positive for malaise/fatigue.  Respiratory: Positive for shortness of breath. Negative for cough, hemoptysis, sputum production and wheezing.   Cardiovascular: Positive for leg swelling.  Gastrointestinal: Negative.   Genitourinary: Negative.     Blood pressure 132/68, pulse 96, temperature 98 F (36.7 C), temperature source Oral, resp. rate 20, weight 82.645 kg (182 lb 3.2 oz), SpO2  100.00%. Physical Exam  Nursing note and vitals reviewed.    Assessment/Plan:  1. Pulmonary renal syndrome, Goodpasture's: On oral cyclophosphamide, prednisone and receiving plasmapheresis with periodic lab checks to evaluate for packed red cell transfusion as needed. 2. Volume overload: Improving gradually with diuretic therapy-adjust as outpatient and continue recommendations of low sodium diet. 3. Anemia: Anemia of critical illness/possible alveolar hemorrhage with anti-GBM, packed red cell transfusion as needed, ongoing plasmapheresis  Bryan Hubbard 01/16/2014, 1:21 PM

## 2014-01-17 ENCOUNTER — Non-Acute Institutional Stay (HOSPITAL_COMMUNITY)
Admission: AD | Admit: 2014-01-17 | Discharge: 2014-01-17 | Disposition: A | Discharge status: Home / Self Care | Payer: Medicare Other | Source: Ambulatory Visit | Attending: Nephrology | Admitting: Nephrology

## 2014-01-17 DIAGNOSIS — N058 Unspecified nephritic syndrome with other morphologic changes: Secondary | ICD-10-CM | POA: Insufficient documentation

## 2014-01-17 DIAGNOSIS — M310 Hypersensitivity angiitis: Secondary | ICD-10-CM | POA: Insufficient documentation

## 2014-01-17 LAB — COMPREHENSIVE METABOLIC PANEL
ALK PHOS: 22 U/L — AB (ref 39–117)
ALT: 32 U/L (ref 0–53)
AST: 30 U/L (ref 0–37)
Albumin: 4.4 g/dL (ref 3.5–5.2)
BUN: 46 mg/dL — ABNORMAL HIGH (ref 6–23)
CO2: 19 mEq/L (ref 19–32)
Calcium: 9 mg/dL (ref 8.4–10.5)
Chloride: 103 mEq/L (ref 96–112)
Creatinine, Ser: 1.85 mg/dL — ABNORMAL HIGH (ref 0.50–1.35)
GFR calc Af Amer: 40 mL/min — ABNORMAL LOW (ref >90)
GFR calc non Af Amer: 34 mL/min — ABNORMAL LOW (ref >90)
GLUCOSE: 170 mg/dL — AB (ref 70–99)
POTASSIUM: 4.4 meq/L (ref 3.7–5.3)
SODIUM: 139 meq/L (ref 137–147)
TOTAL PROTEIN: 5.3 g/dL — AB (ref 6.0–8.3)
Total Bilirubin: 0.7 mg/dL (ref 0.3–1.2)

## 2014-01-17 LAB — POCT I-STAT, CHEM 8
BUN: 40 mg/dL — AB (ref 6–23)
CALCIUM ION: 1.3 mmol/L (ref 1.13–1.30)
CREATININE: 2 mg/dL — AB (ref 0.50–1.35)
Chloride: 103 mEq/L (ref 96–112)
Glucose, Bld: 167 mg/dL — ABNORMAL HIGH (ref 70–99)
HCT: 27 % — ABNORMAL LOW (ref 39.0–52.0)
Hemoglobin: 9.2 g/dL — ABNORMAL LOW (ref 13.0–17.0)
Potassium: 4.2 mEq/L (ref 3.7–5.3)
Sodium: 140 mEq/L (ref 137–147)
TCO2: 20 mmol/L (ref 0–100)

## 2014-01-17 LAB — CBC
HCT: 26.7 % — ABNORMAL LOW (ref 39.0–52.0)
HEMOGLOBIN: 8.9 g/dL — AB (ref 13.0–17.0)
MCH: 29.4 pg (ref 26.0–34.0)
MCHC: 33.3 g/dL (ref 30.0–36.0)
MCV: 88.1 fL (ref 78.0–100.0)
Platelets: 171 10*3/uL (ref 150–400)
RBC: 3.03 MIL/uL — AB (ref 4.22–5.81)
RDW: 19.3 % — ABNORMAL HIGH (ref 11.5–15.5)
WBC: 5.5 10*3/uL (ref 4.0–10.5)

## 2014-01-17 MED ORDER — HEPARIN SODIUM (PORCINE) 1000 UNIT/ML IJ SOLN
1000.0000 [IU] | Freq: Once | INTRAMUSCULAR | Status: AC
  Administered 2014-01-17: 1000 [IU]

## 2014-01-17 MED ORDER — ACD FORMULA A 0.73-2.45-2.2 GM/100ML VI SOLN
Status: AC
  Administered 2014-01-17: 500 mL via INTRAVENOUS
  Filled 2014-01-17: qty 500

## 2014-01-17 MED ORDER — CALCIUM CARBONATE ANTACID 500 MG PO CHEW
2.0000 | CHEWABLE_TABLET | ORAL | Status: AC
  Administered 2014-01-17 (×2): 400 mg via ORAL

## 2014-01-17 MED ORDER — ALBUMIN HUMAN 25 % IV SOLN
INTRAVENOUS | Status: AC
  Administered 2014-01-17 (×4): via INTRAVENOUS_CENTRAL
  Filled 2014-01-17 (×5): qty 200

## 2014-01-17 MED ORDER — CALCIUM CARBONATE ANTACID 500 MG PO CHEW
CHEWABLE_TABLET | ORAL | Status: AC
  Administered 2014-01-17: 400 mg via ORAL
  Filled 2014-01-17: qty 4

## 2014-01-17 MED ORDER — DIPHENHYDRAMINE HCL 25 MG PO CAPS: 25.0000 mg | ORAL_CAPSULE | Freq: Four times a day (QID) | ORAL | Status: DC | PRN

## 2014-01-17 MED ORDER — ACETAMINOPHEN 325 MG PO TABS: 650.0000 mg | ORAL_TABLET | ORAL | Status: DC | PRN

## 2014-01-17 MED ORDER — CALCIUM GLUCONATE 10 % IV SOLN
4.0000 g | Freq: Once | INTRAVENOUS | Status: AC
  Administered 2014-01-17: 4 g via INTRAVENOUS
  Filled 2014-01-17 (×2): qty 40

## 2014-01-17 MED ORDER — ACD FORMULA A 0.73-2.45-2.2 GM/100ML VI SOLN
500.0000 mL | Status: DC
  Administered 2014-01-17: 500 mL via INTRAVENOUS

## 2014-01-17 NOTE — Progress Notes (Signed)
TPE tolerated procedure well. Next appt Friday 0830am confirmed with pt

## 2014-01-19 ENCOUNTER — Non-Acute Institutional Stay (HOSPITAL_COMMUNITY)
Admission: AD | Admit: 2014-01-19 | Discharge: 2014-01-19 | Disposition: A | Discharge status: Home / Self Care | Payer: Medicare Other | Source: Ambulatory Visit | Attending: Nephrology | Admitting: Nephrology

## 2014-01-19 DIAGNOSIS — Z87891 Personal history of nicotine dependence: Secondary | ICD-10-CM | POA: Insufficient documentation

## 2014-01-19 DIAGNOSIS — Z79899 Other long term (current) drug therapy: Secondary | ICD-10-CM | POA: Insufficient documentation

## 2014-01-19 DIAGNOSIS — Z86718 Personal history of other venous thrombosis and embolism: Secondary | ICD-10-CM | POA: Insufficient documentation

## 2014-01-19 DIAGNOSIS — E785 Hyperlipidemia, unspecified: Secondary | ICD-10-CM | POA: Diagnosis not present

## 2014-01-19 DIAGNOSIS — I4891 Unspecified atrial fibrillation: Secondary | ICD-10-CM | POA: Diagnosis not present

## 2014-01-19 DIAGNOSIS — M310 Hypersensitivity angiitis: Secondary | ICD-10-CM | POA: Diagnosis not present

## 2014-01-19 DIAGNOSIS — I5032 Chronic diastolic (congestive) heart failure: Secondary | ICD-10-CM | POA: Diagnosis not present

## 2014-01-19 DIAGNOSIS — E8779 Other fluid overload: Secondary | ICD-10-CM | POA: Diagnosis not present

## 2014-01-19 DIAGNOSIS — IMO0002 Reserved for concepts with insufficient information to code with codable children: Secondary | ICD-10-CM | POA: Diagnosis not present

## 2014-01-19 DIAGNOSIS — D6489 Other specified anemias: Secondary | ICD-10-CM | POA: Diagnosis not present

## 2014-01-19 DIAGNOSIS — I10 Essential (primary) hypertension: Secondary | ICD-10-CM | POA: Insufficient documentation

## 2014-01-19 DIAGNOSIS — N058 Unspecified nephritic syndrome with other morphologic changes: Secondary | ICD-10-CM | POA: Diagnosis not present

## 2014-01-19 DIAGNOSIS — J45909 Unspecified asthma, uncomplicated: Secondary | ICD-10-CM | POA: Diagnosis not present

## 2014-01-19 LAB — CBC WITH DIFFERENTIAL/PLATELET
BASOS PCT: 0 % (ref 0–1)
Basophils Absolute: 0 10*3/uL (ref 0.0–0.1)
Eosinophils Absolute: 0 10*3/uL (ref 0.0–0.7)
Eosinophils Relative: 0 % (ref 0–5)
HCT: 26.2 % — ABNORMAL LOW (ref 39.0–52.0)
HEMOGLOBIN: 8.9 g/dL — AB (ref 13.0–17.0)
Lymphocytes Relative: 9 % — ABNORMAL LOW (ref 12–46)
Lymphs Abs: 0.6 10*3/uL — ABNORMAL LOW (ref 0.7–4.0)
MCH: 29.6 pg (ref 26.0–34.0)
MCHC: 34 g/dL (ref 30.0–36.0)
MCV: 87 fL (ref 78.0–100.0)
MONOS PCT: 12 % (ref 3–12)
Monocytes Absolute: 0.7 10*3/uL (ref 0.1–1.0)
Neutro Abs: 4.7 10*3/uL (ref 1.7–7.7)
Neutrophils Relative %: 79 % — ABNORMAL HIGH (ref 43–77)
Platelets: 178 10*3/uL (ref 150–400)
RBC: 3.01 MIL/uL — AB (ref 4.22–5.81)
RDW: 19.4 % — ABNORMAL HIGH (ref 11.5–15.5)
WBC: 6 10*3/uL (ref 4.0–10.5)

## 2014-01-19 LAB — COMPREHENSIVE METABOLIC PANEL
ALT: 32 U/L (ref 0–53)
AST: 29 U/L (ref 0–37)
Albumin: 4.4 g/dL (ref 3.5–5.2)
Alkaline Phosphatase: 22 U/L — ABNORMAL LOW (ref 39–117)
BUN: 48 mg/dL — AB (ref 6–23)
CALCIUM: 9 mg/dL (ref 8.4–10.5)
CO2: 19 meq/L (ref 19–32)
CREATININE: 1.69 mg/dL — AB (ref 0.50–1.35)
Chloride: 106 mEq/L (ref 96–112)
GFR calc Af Amer: 45 mL/min — ABNORMAL LOW (ref >90)
GFR, EST NON AFRICAN AMERICAN: 38 mL/min — AB (ref >90)
GLUCOSE: 143 mg/dL — AB (ref 70–99)
Potassium: 4.4 mEq/L (ref 3.7–5.3)
Sodium: 141 mEq/L (ref 137–147)
Total Bilirubin: 0.8 mg/dL (ref 0.3–1.2)
Total Protein: 5.5 g/dL — ABNORMAL LOW (ref 6.0–8.3)

## 2014-01-19 MED ORDER — CALCIUM CARBONATE ANTACID 500 MG PO CHEW
2.0000 | CHEWABLE_TABLET | ORAL | Status: AC
  Administered 2014-01-19 (×2): 400 mg via ORAL

## 2014-01-19 MED ORDER — ACD FORMULA A 0.73-2.45-2.2 GM/100ML VI SOLN
Status: AC
  Administered 2014-01-19: 500 mL via INTRAVENOUS
  Filled 2014-01-19: qty 500

## 2014-01-19 MED ORDER — DIPHENHYDRAMINE HCL 25 MG PO CAPS: 25.0000 mg | ORAL_CAPSULE | Freq: Four times a day (QID) | ORAL | Status: DC | PRN

## 2014-01-19 MED ORDER — CALCIUM CARBONATE ANTACID 500 MG PO CHEW
CHEWABLE_TABLET | ORAL | Status: AC
  Administered 2014-01-19: 400 mg via ORAL
  Filled 2014-01-19: qty 4

## 2014-01-19 MED ORDER — ACD FORMULA A 0.73-2.45-2.2 GM/100ML VI SOLN
500.0000 mL | Status: DC
  Administered 2014-01-19: 500 mL via INTRAVENOUS

## 2014-01-19 MED ORDER — ACETAMINOPHEN 325 MG PO TABS: 650.0000 mg | ORAL_TABLET | ORAL | Status: DC | PRN

## 2014-01-19 MED ORDER — SODIUM CHLORIDE 0.9 % IV SOLN
INTRAVENOUS | Status: AC
  Administered 2014-01-19 (×4): via INTRAVENOUS_CENTRAL
  Filled 2014-01-19 (×4): qty 200

## 2014-01-19 MED ORDER — HEPARIN SODIUM (PORCINE) 1000 UNIT/ML IJ SOLN
1000.0000 [IU] | Freq: Once | INTRAMUSCULAR | Status: AC
  Administered 2014-01-19: 1000 [IU]

## 2014-01-19 MED ORDER — SODIUM CHLORIDE 0.9 % IV SOLN
4.0000 g | Freq: Once | INTRAVENOUS | Status: AC
  Administered 2014-01-19: 4 g via INTRAVENOUS
  Filled 2014-01-19: qty 40

## 2014-01-19 NOTE — Progress Notes (Signed)
TPE completed without issue. No complaints. Will return tomorrow at 0830.

## 2014-01-20 ENCOUNTER — Non-Acute Institutional Stay (HOSPITAL_COMMUNITY): Admission: AD | Admit: 2014-01-20 | Payer: Medicare Other | Source: Ambulatory Visit | Admitting: Nephrology

## 2014-01-20 LAB — CBC
HCT: 23.8 % — ABNORMAL LOW (ref 39.0–52.0)
Hemoglobin: 8.1 g/dL — ABNORMAL LOW (ref 13.0–17.0)
MCH: 30 pg (ref 26.0–34.0)
MCHC: 34 g/dL (ref 30.0–36.0)
MCV: 88.1 fL (ref 78.0–100.0)
PLATELETS: 151 10*3/uL (ref 150–400)
RBC: 2.7 MIL/uL — AB (ref 4.22–5.81)
RDW: 19.7 % — ABNORMAL HIGH (ref 11.5–15.5)
WBC: 5.3 10*3/uL (ref 4.0–10.5)

## 2014-01-20 LAB — POCT I-STAT, CHEM 8
BUN: 43 mg/dL — ABNORMAL HIGH (ref 6–23)
CREATININE: 1.8 mg/dL — AB (ref 0.50–1.35)
Calcium, Ion: 1.32 mmol/L — ABNORMAL HIGH (ref 1.13–1.30)
Chloride: 107 mEq/L (ref 96–112)
Glucose, Bld: 153 mg/dL — ABNORMAL HIGH (ref 70–99)
HEMATOCRIT: 23 % — AB (ref 39.0–52.0)
HEMOGLOBIN: 7.8 g/dL — AB (ref 13.0–17.0)
Potassium: 4.2 mEq/L (ref 3.7–5.3)
SODIUM: 143 meq/L (ref 137–147)
TCO2: 19 mmol/L (ref 0–100)

## 2014-01-20 LAB — RETICULOCYTES
RBC.: 2.7 MIL/uL — AB (ref 4.22–5.81)
RETIC CT PCT: 1.1 % (ref 0.4–3.1)
Retic Count, Absolute: 29.7 10*3/uL (ref 19.0–186.0)

## 2014-01-20 MED ORDER — SODIUM CHLORIDE 0.9 % IV SOLN
4.0000 g | Freq: Once | INTRAVENOUS | Status: DC
  Administered 2014-01-20: 4 g via INTRAVENOUS
  Filled 2014-01-20: qty 40

## 2014-01-20 MED ORDER — SODIUM CHLORIDE 0.9 % IV SOLN
INTRAVENOUS | Status: AC
  Administered 2014-01-20 (×4): via INTRAVENOUS_CENTRAL
  Filled 2014-01-20 (×4): qty 200

## 2014-01-20 MED ORDER — ACETAMINOPHEN 325 MG PO TABS: 650.0000 mg | ORAL_TABLET | ORAL | Status: DC | PRN

## 2014-01-20 MED ORDER — DIPHENHYDRAMINE HCL 25 MG PO CAPS: 25.0000 mg | ORAL_CAPSULE | Freq: Four times a day (QID) | ORAL | Status: DC | PRN

## 2014-01-20 MED ORDER — CALCIUM CARBONATE ANTACID 500 MG PO CHEW: 2.0000 | CHEWABLE_TABLET | ORAL | Status: DC

## 2014-01-20 MED ORDER — ACD FORMULA A 0.73-2.45-2.2 GM/100ML VI SOLN
500.0000 mL | Status: DC
Start: 2014-01-20 — End: 2014-01-20

## 2014-01-20 MED ORDER — ACD FORMULA A 0.73-2.45-2.2 GM/100ML VI SOLN
Status: AC
  Administered 2014-01-20: 09:00:00
  Filled 2014-01-20: qty 500

## 2014-01-20 MED ORDER — HEPARIN SODIUM (PORCINE) 1000 UNIT/ML IJ SOLN
1000.0000 [IU] | Freq: Once | INTRAMUSCULAR | Status: DC
Start: 2014-01-20 — End: 2014-01-20

## 2014-01-20 NOTE — Progress Notes (Signed)
TPE complete without adverse events.  Vital signs stable and pt is without complaint.  Pt aware of next appointment on Monday, April 27,2014 @ 0830.  Pt being discharged to home under self care.

## 2014-01-22 ENCOUNTER — Non-Acute Institutional Stay (HOSPITAL_COMMUNITY)
Admission: AD | Admit: 2014-01-22 | Discharge: 2014-01-22 | Disposition: A | Discharge status: Home / Self Care | Payer: Medicare Other | Source: Ambulatory Visit | Attending: Nephrology | Admitting: Nephrology

## 2014-01-22 DIAGNOSIS — M310 Hypersensitivity angiitis: Secondary | ICD-10-CM | POA: Insufficient documentation

## 2014-01-22 LAB — COMPREHENSIVE METABOLIC PANEL
ALK PHOS: 18 U/L — AB (ref 39–117)
ALT: 27 U/L (ref 0–53)
AST: 25 U/L (ref 0–37)
Albumin: 4.3 g/dL (ref 3.5–5.2)
BILIRUBIN TOTAL: 0.9 mg/dL (ref 0.3–1.2)
BUN: 37 mg/dL — AB (ref 6–23)
CO2: 20 mEq/L (ref 19–32)
Calcium: 8.7 mg/dL (ref 8.4–10.5)
Chloride: 107 mEq/L (ref 96–112)
Creatinine, Ser: 1.56 mg/dL — ABNORMAL HIGH (ref 0.50–1.35)
GFR calc non Af Amer: 42 mL/min — ABNORMAL LOW (ref >90)
GFR, EST AFRICAN AMERICAN: 49 mL/min — AB (ref >90)
GLUCOSE: 130 mg/dL — AB (ref 70–99)
POTASSIUM: 4 meq/L (ref 3.7–5.3)
Sodium: 141 mEq/L (ref 137–147)
TOTAL PROTEIN: 5.1 g/dL — AB (ref 6.0–8.3)

## 2014-01-22 LAB — POCT I-STAT, CHEM 8
BUN: 45 mg/dL — ABNORMAL HIGH (ref 6–23)
CREATININE: 1.9 mg/dL — AB (ref 0.50–1.35)
Calcium, Ion: 1.31 mmol/L — ABNORMAL HIGH (ref 1.13–1.30)
Chloride: 105 mEq/L (ref 96–112)
Glucose, Bld: 144 mg/dL — ABNORMAL HIGH (ref 70–99)
HCT: 27 % — ABNORMAL LOW (ref 39.0–52.0)
HEMOGLOBIN: 9.2 g/dL — AB (ref 13.0–17.0)
Potassium: 4.3 mEq/L (ref 3.7–5.3)
SODIUM: 142 meq/L (ref 137–147)
TCO2: 19 mmol/L (ref 0–100)

## 2014-01-22 LAB — CBC
HCT: 23.1 % — ABNORMAL LOW (ref 39.0–52.0)
Hemoglobin: 7.8 g/dL — ABNORMAL LOW (ref 13.0–17.0)
MCH: 29.9 pg (ref 26.0–34.0)
MCHC: 33.8 g/dL (ref 30.0–36.0)
MCV: 88.5 fL (ref 78.0–100.0)
Platelets: 159 10*3/uL (ref 150–400)
RBC: 2.61 MIL/uL — AB (ref 4.22–5.81)
RDW: 20.3 % — ABNORMAL HIGH (ref 11.5–15.5)
WBC: 5.7 10*3/uL (ref 4.0–10.5)

## 2014-01-22 LAB — GLOMERULAR BASEMENT MEMBRANE ANTIBODIES: GBM Ab: <1

## 2014-01-22 MED ORDER — CALCIUM CARBONATE ANTACID 500 MG PO CHEW
CHEWABLE_TABLET | ORAL | Status: AC
  Administered 2014-01-22: 400 mg via ORAL
  Filled 2014-01-22: qty 4

## 2014-01-22 MED ORDER — HEPARIN SODIUM (PORCINE) 1000 UNIT/ML IJ SOLN
1000.0000 [IU] | Freq: Once | INTRAMUSCULAR | Status: AC
Start: 2014-01-22 — End: 2014-01-22
  Administered 2014-01-22: 1000 [IU]

## 2014-01-22 MED ORDER — ACD FORMULA A 0.73-2.45-2.2 GM/100ML VI SOLN
Status: AC
  Administered 2014-01-22: 500 mL via INTRAVENOUS
  Filled 2014-01-22: qty 500

## 2014-01-22 MED ORDER — ACD FORMULA A 0.73-2.45-2.2 GM/100ML VI SOLN
500.0000 mL | Status: DC
  Administered 2014-01-22: 500 mL via INTRAVENOUS

## 2014-01-22 MED ORDER — DIPHENHYDRAMINE HCL 25 MG PO CAPS: 25.0000 mg | ORAL_CAPSULE | Freq: Four times a day (QID) | ORAL | Status: DC | PRN

## 2014-01-22 MED ORDER — ACETAMINOPHEN 325 MG PO TABS: 650.0000 mg | ORAL_TABLET | ORAL | Status: DC | PRN

## 2014-01-22 MED ORDER — SODIUM CHLORIDE 0.9 % IV SOLN
INTRAVENOUS | Status: AC
  Administered 2014-01-22 (×4): via INTRAVENOUS_CENTRAL
  Filled 2014-01-22 (×4): qty 200

## 2014-01-22 MED ORDER — SODIUM CHLORIDE 0.9 % IV SOLN
4.0000 g | Freq: Once | INTRAVENOUS | Status: AC
  Administered 2014-01-22: 4 g via INTRAVENOUS
  Filled 2014-01-22: qty 40

## 2014-01-22 MED ORDER — CALCIUM CARBONATE ANTACID 500 MG PO CHEW
2.0000 | CHEWABLE_TABLET | ORAL | Status: AC
  Administered 2014-01-22 (×2): 400 mg via ORAL

## 2014-01-22 NOTE — Progress Notes (Signed)
TPE- tolerating well. Dr. Posey Pronto paged about decreasing hgb, resulted 7.8 today. Will set up with Short stay for 2 units prbc's to be transfused tomorrow per MD. Will draw and send a type and cross today. Pt is to arrive at 0800 for transfusion on 01/23/14. Per Dr. Posey Pronto he is to come back for exchange on Wednesday pending GBM results. Will follow up with patient.

## 2014-01-23 ENCOUNTER — Encounter (HOSPITAL_COMMUNITY)
Admit: 2014-01-23 | Discharge: 2014-01-23 | Disposition: A | Discharge status: Home / Self Care | Payer: Medicare Other | Attending: Nephrology | Admitting: Nephrology

## 2014-01-23 DIAGNOSIS — D649 Anemia, unspecified: Secondary | ICD-10-CM | POA: Insufficient documentation

## 2014-01-23 DIAGNOSIS — M310 Hypersensitivity angiitis: Secondary | ICD-10-CM | POA: Diagnosis not present

## 2014-01-23 LAB — POCT I-STAT, CHEM 8
BUN: 34 mg/dL — AB (ref 6–23)
CALCIUM ION: 1.29 mmol/L (ref 1.13–1.30)
CHLORIDE: 105 meq/L (ref 96–112)
Creatinine, Ser: 1.7 mg/dL — ABNORMAL HIGH (ref 0.50–1.35)
GLUCOSE: 130 mg/dL — AB (ref 70–99)
HCT: 23 % — ABNORMAL LOW (ref 39.0–52.0)
Hemoglobin: 7.8 g/dL — ABNORMAL LOW (ref 13.0–17.0)
Potassium: 3.9 mEq/L (ref 3.7–5.3)
Sodium: 142 mEq/L (ref 137–147)
TCO2: 19 mmol/L (ref 0–100)

## 2014-01-23 LAB — PREPARE RBC (CROSSMATCH)

## 2014-01-24 ENCOUNTER — Non-Acute Institutional Stay (HOSPITAL_COMMUNITY)
Admission: AD | Admit: 2014-01-24 | Discharge: 2014-01-24 | Disposition: A | Discharge status: Home / Self Care | Payer: Medicare Other | Source: Ambulatory Visit | Attending: Nephrology | Admitting: Nephrology

## 2014-01-24 DIAGNOSIS — Z79899 Other long term (current) drug therapy: Secondary | ICD-10-CM | POA: Insufficient documentation

## 2014-01-24 DIAGNOSIS — IMO0002 Reserved for concepts with insufficient information to code with codable children: Secondary | ICD-10-CM | POA: Insufficient documentation

## 2014-01-24 DIAGNOSIS — Z87891 Personal history of nicotine dependence: Secondary | ICD-10-CM | POA: Insufficient documentation

## 2014-01-24 DIAGNOSIS — E785 Hyperlipidemia, unspecified: Secondary | ICD-10-CM | POA: Insufficient documentation

## 2014-01-24 DIAGNOSIS — R002 Palpitations: Secondary | ICD-10-CM | POA: Insufficient documentation

## 2014-01-24 DIAGNOSIS — E8779 Other fluid overload: Secondary | ICD-10-CM | POA: Insufficient documentation

## 2014-01-24 DIAGNOSIS — I10 Essential (primary) hypertension: Secondary | ICD-10-CM | POA: Insufficient documentation

## 2014-01-24 DIAGNOSIS — I4891 Unspecified atrial fibrillation: Secondary | ICD-10-CM | POA: Insufficient documentation

## 2014-01-24 DIAGNOSIS — Z86718 Personal history of other venous thrombosis and embolism: Secondary | ICD-10-CM | POA: Insufficient documentation

## 2014-01-24 DIAGNOSIS — J45909 Unspecified asthma, uncomplicated: Secondary | ICD-10-CM | POA: Insufficient documentation

## 2014-01-24 DIAGNOSIS — I5032 Chronic diastolic (congestive) heart failure: Secondary | ICD-10-CM | POA: Insufficient documentation

## 2014-01-24 DIAGNOSIS — M310 Hypersensitivity angiitis: Secondary | ICD-10-CM | POA: Insufficient documentation

## 2014-01-24 DIAGNOSIS — N058 Unspecified nephritic syndrome with other morphologic changes: Secondary | ICD-10-CM | POA: Insufficient documentation

## 2014-01-24 DIAGNOSIS — D6489 Other specified anemias: Secondary | ICD-10-CM | POA: Insufficient documentation

## 2014-01-24 LAB — COMPREHENSIVE METABOLIC PANEL
ALBUMIN: 4.4 g/dL (ref 3.5–5.2)
ALK PHOS: 21 U/L — AB (ref 39–117)
ALT: 30 U/L (ref 0–53)
AST: 28 U/L (ref 0–37)
BUN: 36 mg/dL — AB (ref 6–23)
CO2: 24 mEq/L (ref 19–32)
CREATININE: 1.54 mg/dL — AB (ref 0.50–1.35)
Calcium: 8.9 mg/dL (ref 8.4–10.5)
Chloride: 107 mEq/L (ref 96–112)
GFR calc non Af Amer: 43 mL/min — ABNORMAL LOW (ref >90)
GFR, EST AFRICAN AMERICAN: 50 mL/min — AB (ref >90)
GLUCOSE: 103 mg/dL — AB (ref 70–99)
POTASSIUM: 3.8 meq/L (ref 3.7–5.3)
Sodium: 144 mEq/L (ref 137–147)
Total Bilirubin: 1.2 mg/dL (ref 0.3–1.2)
Total Protein: 5.2 g/dL — ABNORMAL LOW (ref 6.0–8.3)

## 2014-01-24 LAB — CBC
HEMATOCRIT: 26.2 % — AB (ref 39.0–52.0)
HEMOGLOBIN: 8.9 g/dL — AB (ref 13.0–17.0)
MCH: 29.5 pg (ref 26.0–34.0)
MCHC: 34 g/dL (ref 30.0–36.0)
MCV: 86.8 fL (ref 78.0–100.0)
Platelets: 139 10*3/uL — ABNORMAL LOW (ref 150–400)
RBC: 3.02 MIL/uL — ABNORMAL LOW (ref 4.22–5.81)
RDW: 20.2 % — AB (ref 11.5–15.5)
WBC: 4.7 10*3/uL (ref 4.0–10.5)

## 2014-01-24 LAB — POCT I-STAT, CHEM 8
BUN: 34 mg/dL — ABNORMAL HIGH (ref 6–23)
CREATININE: 1.7 mg/dL — AB (ref 0.50–1.35)
Calcium, Ion: 1.31 mmol/L — ABNORMAL HIGH (ref 1.13–1.30)
Chloride: 103 mEq/L (ref 96–112)
Glucose, Bld: 104 mg/dL — ABNORMAL HIGH (ref 70–99)
HCT: 26 % — ABNORMAL LOW (ref 39.0–52.0)
Hemoglobin: 8.8 g/dL — ABNORMAL LOW (ref 13.0–17.0)
POTASSIUM: 3.7 meq/L (ref 3.7–5.3)
SODIUM: 144 meq/L (ref 137–147)
TCO2: 22 mmol/L (ref 0–100)

## 2014-01-24 LAB — TYPE AND SCREEN
ABO/RH(D): A NEG
ANTIBODY SCREEN: NEGATIVE
UNIT DIVISION: 0
Unit division: 0

## 2014-01-24 MED ORDER — ACD FORMULA A 0.73-2.45-2.2 GM/100ML VI SOLN
Status: AC
  Administered 2014-01-24: 500 mL via INTRAVENOUS
  Filled 2014-01-24: qty 500

## 2014-01-24 MED ORDER — HEPARIN SODIUM (PORCINE) 1000 UNIT/ML IJ SOLN
1000.0000 [IU] | Freq: Once | INTRAMUSCULAR | Status: AC
  Administered 2014-01-24: 1000 [IU]

## 2014-01-24 MED ORDER — ACD FORMULA A 0.73-2.45-2.2 GM/100ML VI SOLN
500.0000 mL | Status: DC
  Administered 2014-01-24 (×2): 500 mL via INTRAVENOUS

## 2014-01-24 MED ORDER — SODIUM CHLORIDE 0.9 % IV SOLN
INTRAVENOUS | Status: AC
  Administered 2014-01-24 (×4): via INTRAVENOUS_CENTRAL
  Filled 2014-01-24 (×4): qty 200

## 2014-01-24 MED ORDER — CALCIUM CARBONATE ANTACID 500 MG PO CHEW
2.0000 | CHEWABLE_TABLET | ORAL | Status: AC
  Administered 2014-01-24 (×2): 400 mg via ORAL

## 2014-01-24 MED ORDER — CALCIUM CARBONATE ANTACID 500 MG PO CHEW
CHEWABLE_TABLET | ORAL | Status: AC
  Administered 2014-01-24: 400 mg via ORAL
  Filled 2014-01-24: qty 2

## 2014-01-24 MED ORDER — SODIUM CHLORIDE 0.9 % IV SOLN
4.0000 g | Freq: Once | INTRAVENOUS | Status: AC
  Administered 2014-01-24: 4 g via INTRAVENOUS
  Filled 2014-01-24: qty 40

## 2014-01-24 MED ORDER — DIPHENHYDRAMINE HCL 25 MG PO CAPS: 25.0000 mg | ORAL_CAPSULE | Freq: Four times a day (QID) | ORAL | Status: DC | PRN

## 2014-01-24 MED ORDER — ACETAMINOPHEN 325 MG PO TABS: 650.0000 mg | ORAL_TABLET | ORAL | Status: DC | PRN

## 2014-01-25 LAB — GLOMERULAR BASEMENT MEMBRANE ANTIBODIES

## 2014-02-14 NOTE — H&P (Signed)
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 He was started on oral therapy with cyclophosphamide and prednisone after initial pulse therapy with Solu-Medrol. He currently on plasma pheresis due to detectable anti-GBM antibody.   Past Medical History   Diagnosis  Date   .  Atrial fibrillation    .  Chronic diastolic heart failure    .  DVT    .  HYPERLIPIDEMIA    .  HYPERTENSION, BENIGN    .  ALLERGIC ASTHMA    .  Palpitations     Past Surgical History   Procedure  Laterality  Date   .  Bone marrow biopsy      Family History   Problem  Relation  Age of Onset   .  Stroke  Mother    .  Heart attack  Maternal Grandmother    Social History: reports that he has quit smoking. He does not have any smokeless tobacco history on file. His alcohol and drug histories are not on file.  Allergies: No Known Allergies  No prescriptions prior to admission   No results found for this or any previous visit (from the past 48 hour(s)).  No results found.  Review of Systems  Constitutional: Positive for malaise/fatigue.  Respiratory: Positive for shortness of breath. Negative for cough, hemoptysis, sputum production and wheezing.  Cardiovascular: Positive for leg swelling.  Gastrointestinal: Negative.  Genitourinary: Negative.    BP 106/50  Pulse 92  Temp(Src) 97.9 F (36.6 C) (Oral)  Resp 19  Wt 82.3 kg (181 lb 7 oz)  SpO2 100%   Physical Exam  Nursing note and vitals reviewed.  Assessment/Plan:  1. Pulmonary renal syndrome, Goodpasture's: On oral  cyclophosphamide, prednisone and receiving plasmapheresis with periodic lab checks to evaluate for packed red cell transfusion as needed. At his last visit to the clinic, anti-GBM titers were still detectable-the decision made to continue plasmapheresis for another 4 treatments.  2. Volume overload: Improving gradually with diuretic therapy-adjust as outpatient and continue recommendations of low sodium diet.  3. 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

## 2014-02-14 NOTE — H&P (Signed)
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 He was started on oral therapy with cyclophosphamide and prednisone after initial pulse therapy with Solu-Medrol. He currently on plasma pheresis due to detectable anti-GBM antibody.   Past Medical History   Diagnosis  Date   .  Atrial fibrillation    .  Chronic diastolic heart failure    .  DVT    .  HYPERLIPIDEMIA    .  HYPERTENSION, BENIGN    .  ALLERGIC ASTHMA    .  Palpitations     Past Surgical History   Procedure  Laterality  Date   .  Bone marrow biopsy      Family History   Problem  Relation  Age of Onset   .  Stroke  Mother    .  Heart attack  Maternal Grandmother    Social History: reports that he has quit smoking. He does not have any smokeless tobacco history on file. His alcohol and drug histories are not on file.  Allergies: No Known Allergies  No prescriptions prior to admission   No results found for this or any previous visit (from the past 48 hour(s)).  No results found.  Review of Systems  Constitutional: Positive for malaise/fatigue.  Respiratory: Positive for shortness of breath. Negative for cough, hemoptysis, sputum production and wheezing.  Cardiovascular: Positive for leg swelling.  Gastrointestinal: Negative.  Genitourinary: Negative.   BP 119/58  Pulse 93  Temp(Src) 98 F (36.7 C) (Oral)  Resp 20  SpO2 100% .  Physical Exam  Nursing note and vitals reviewed.  Assessment/Plan:  1. Pulmonary renal syndrome, Goodpasture's: On oral cyclophosphamide, prednisone and receiving  plasmapheresis with periodic lab checks to evaluate for packed red cell transfusion as needed. At his last visit to the clinic, anti-GBM titers were still detectable-the decision made to continue plasmapheresis for another 4 treatments.  2. Volume overload: Improving gradually with diuretic therapy-adjust as outpatient and continue recommendations of low sodium diet.  3. 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

## 2014-02-14 NOTE — H&P (Signed)
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 He was started on oral therapy with cyclophosphamide and prednisone after initial pulse therapy with Solu-Medrol. He currently on plasma pheresis due to detectable anti-GBM antibody.   Past Medical History   Diagnosis  Date   .  Atrial fibrillation    .  Chronic diastolic heart failure    .  DVT    .  HYPERLIPIDEMIA    .  HYPERTENSION, BENIGN    .  ALLERGIC ASTHMA    .  Palpitations     Past Surgical History   Procedure  Laterality  Date   .  Bone marrow biopsy      Family History   Problem  Relation  Age of Onset   .  Stroke  Mother    .  Heart attack  Maternal Grandmother    Social History: reports that he has quit smoking. He does not have any smokeless tobacco history on file. His alcohol and drug histories are not on file.   Allergies: No Known Allergies   No prescriptions prior to admission     Review of Systems  Constitutional: Positive for malaise/fatigue.  Respiratory: Positive for shortness of breath. Negative for cough, hemoptysis, sputum production and wheezing.  Cardiovascular: Positive for leg swelling.  Gastrointestinal: Negative.  Genitourinary: Negative.    BP 117/66  Pulse 95  Temp(Src) 98.7 F (37.1 C) (Oral)  Resp 14  Wt 83.9 kg (184 lb 15.5 oz)  SpO2 100%  Physical Exam  Nursing note and vitals reviewed.  Assessment/Plan:  1. Pulmonary renal syndrome, Goodpasture's: On oral cyclophosphamide, prednisone and receiving plasmapheresis with periodic lab checks to evaluate for  packed red cell transfusion as needed. At his last visit to the clinic, anti-GBM titers were still detectable-the decision made to continue plasmapheresis for another 4 treatments.  2. Volume overload: Improving gradually with diuretic therapy-adjust as outpatient and continue recommendations of low sodium diet.  3. 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

## 2014-02-14 NOTE — H&P (Signed)
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 He was started on oral therapy with cyclophosphamide and prednisone after initial pulse therapy with Solu-Medrol. He currently on plasma pheresis due to detectable anti-GBM antibody.   Past Medical History   Diagnosis  Date   .  Atrial fibrillation    .  Chronic diastolic heart failure    .  DVT    .  HYPERLIPIDEMIA    .  HYPERTENSION, BENIGN    .  ALLERGIC ASTHMA    .  Palpitations     Past Surgical History   Procedure  Laterality  Date   .  Bone marrow biopsy      Family History   Problem  Relation  Age of Onset   .  Stroke  Mother    .  Heart attack  Maternal Grandmother    Social History: reports that he has quit smoking. He does not have any smokeless tobacco history on file. His alcohol and drug histories are not on file.  Allergies: No Known Allergies  No prescriptions prior to admission     Review of Systems  Constitutional: Positive for malaise/fatigue.  Respiratory: Positive for shortness of breath. Negative for cough, hemoptysis, sputum production and wheezing.  Cardiovascular: Positive for leg swelling.  Gastrointestinal: Negative.  Genitourinary: Negative.    BP 111/60  Pulse 87  Temp(Src) 97.9 F (36.6 C) (Oral)  Resp 20  Wt 82.555 kg (182 lb)  SpO2 100%  Physical Exam  Nursing note and vitals reviewed.  Assessment/Plan:  1. Pulmonary renal syndrome, Goodpasture's: On oral cyclophosphamide, prednisone and receiving plasmapheresis with periodic lab checks to evaluate for packed red  cell transfusion as needed. At his last visit to the clinic, anti-GBM titers were still detectable-the decision made to continue plasmapheresis for another 4 treatments.  2. Volume overload: Improving gradually with diuretic therapy-adjust as outpatient and continue recommendations of low sodium diet.  3. 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

## 2014-02-28 ENCOUNTER — Encounter (HOSPITAL_COMMUNITY)
Admission: RE | Admit: 2014-02-28 | Discharge: 2014-02-28 | Disposition: A | Discharge status: Home / Self Care | Payer: Medicare Other | Source: Ambulatory Visit | Attending: Nephrology | Admitting: Nephrology

## 2014-02-28 ENCOUNTER — Encounter (HOSPITAL_COMMUNITY): Payer: Self-pay

## 2014-02-28 ENCOUNTER — Other Ambulatory Visit (HOSPITAL_COMMUNITY): Payer: Self-pay | Admitting: Nephrology

## 2014-02-28 DIAGNOSIS — N059 Unspecified nephritic syndrome with unspecified morphologic changes: Secondary | ICD-10-CM | POA: Insufficient documentation

## 2014-02-28 DIAGNOSIS — D638 Anemia in other chronic diseases classified elsewhere: Secondary | ICD-10-CM | POA: Insufficient documentation

## 2014-02-28 DIAGNOSIS — D72819 Decreased white blood cell count, unspecified: Secondary | ICD-10-CM | POA: Insufficient documentation

## 2014-02-28 LAB — CBC WITH DIFFERENTIAL/PLATELET
BASOS ABS: 0 10*3/uL (ref 0.0–0.1)
Basophils Relative: 3 % — ABNORMAL HIGH (ref 0–1)
EOS ABS: 0 10*3/uL (ref 0.0–0.7)
EOS PCT: 0 % (ref 0–5)
HCT: 25 % — ABNORMAL LOW (ref 39.0–52.0)
Hemoglobin: 8.5 g/dL — ABNORMAL LOW (ref 13.0–17.0)
LYMPHS PCT: 28 % (ref 12–46)
Lymphs Abs: 0.2 10*3/uL — ABNORMAL LOW (ref 0.7–4.0)
MCH: 31.3 pg (ref 26.0–34.0)
MCHC: 34 g/dL (ref 30.0–36.0)
MCV: 91.9 fL (ref 78.0–100.0)
MONOS PCT: 69 % — AB (ref 3–12)
Monocytes Absolute: 0.4 10*3/uL (ref 0.1–1.0)
Neutro Abs: 0 10*3/uL — ABNORMAL LOW (ref 1.7–7.7)
Neutrophils Relative %: 0 % — ABNORMAL LOW (ref 43–77)
PLATELETS: 199 10*3/uL (ref 150–400)
RBC: 2.72 MIL/uL — AB (ref 4.22–5.81)
RDW: 24.7 % — AB (ref 11.5–15.5)
WBC: 0.6 10*3/uL — AB (ref 4.0–10.5)
nRBC: 9 /100 WBC — ABNORMAL HIGH

## 2014-02-28 MED ORDER — FILGRASTIM 480 MCG/1.6ML IJ SOLN
450.0000 ug | Freq: Every day | INTRAMUSCULAR | Status: DC
  Administered 2014-02-28: 450 ug via SUBCUTANEOUS
  Filled 2014-02-28 (×2): qty 1.6

## 2014-02-28 NOTE — Progress Notes (Signed)
Pt tolerated injection well, no reaction noted.  Observed pt for 25 min. Post injection.  D/c home, vss, afebrile.

## 2014-02-28 NOTE — Progress Notes (Addendum)
Cbc drawn today.  WBC 0.6.  Neupogen given as ordered.  Critical result called to Dr.Patel's office, spoke with nurse Jan.  Also,Pt to bring names of two other drugs he takes so med list can be updated on Thursday June 4.

## 2014-02-28 NOTE — Discharge Instructions (Signed)
Filgrastim, G-CSF injection What is this medicine? FILGRASTIM, G-CSF (fil GRA stim) stimulates the formation of white blood cells. This medicine is given to patients with conditions that may cause a decrease in white blood cells, like those receiving certain types of chemotherapy or bone marrow transplant. It helps the bone marrow recover its ability to produce white blood cells. Increasing the amount of white blood cells helps to decrease the risk of infection and fever. This medicine may be used for other purposes; ask your health care provider or pharmacist if you have questions. COMMON BRAND NAME(S): Neupogen What should I tell my health care provider before I take this medicine? They need to know if you have any of these conditions: -currently receiving radiation therapy -sickle cell disease -an unusual or allergic reaction to filgrastim, E. coli protein, other medicines, foods, dyes, or preservatives -pregnant or trying to get pregnant -breast-feeding How should I use this medicine? This medicine is for injection into a vein or injection under the skin. It is usually given by a health care professional in a hospital or clinic setting. If you get this medicine at home, you will be taught how to prepare and give this medicine. Always change the site for the injection under the skin. Let the solution warm to room temperature before you use it. Do not shake the solution before you withdraw a dose. Throw away any unused portion. Use exactly as directed. Take your medicine at regular intervals. Do not take your medicine more often than directed. It is important that you put your used needles and syringes in a special sharps container. Do not put them in a trash can. If you do not have a sharps container, call your pharmacist or healthcare provider to get one. Talk to your pediatrician regarding the use of this medicine in children. While this medicine may be prescribed for children for selected  conditions, precautions do apply. Overdosage: If you think you have taken too much of this medicine contact a poison control center or emergency room at once. NOTE: This medicine is only for you. Do not share this medicine with others. What if I miss a dose? Try not to miss doses. If you miss a dose take the dose as soon as you remember. If it is almost time for the next dose, do not take double doses unless told to by your doctor or health care professional. What may interact with this medicine? -lithium -medicines for cancer chemotherapy This list may not describe all possible interactions. Give your health care provider a list of all the medicines, herbs, non-prescription drugs, or dietary supplements you use. Also tell them if you smoke, drink alcohol, or use illegal drugs. Some items may interact with your medicine. What should I watch for while using this medicine? Visit your doctor or health care professional for regular checks on your progress. If you get a fever or any sign of infection while you are using this medicine, do not treat yourself. Check with your doctor or health care professional. Bone pain can usually be relieved by mild pain relievers such as acetaminophen or ibuprofen. Check with your doctor or health care professional before taking these medicines as they may hide a fever. Call your doctor or health care professional if the aches and pains are severe or do not go away. What side effects may I notice from receiving this medicine? Side effects that you should report to your doctor or health care professional as soon as possible: -allergic reactions   like skin rash, itching or hives, swelling of the face, lips, or tongue -difficulty breathing, wheezing -fever -pain, redness, or swelling at the injection site -stomach or side pain, or pain at the shoulder Side effects that usually do not require medical attention (report to your doctor or health care professional if they  continue or are bothersome): -bone pain (ribs, lower back, breast bone) -headache -skin rash This list may not describe all possible side effects. Call your doctor for medical advice about side effects. You may report side effects to FDA at 1-800-FDA-1088. Where should I keep my medicine? Keep out of the reach of children. Store in a refrigerator between 2 and 8 degrees C (36 and 46 degrees F). Do not freeze or leave in direct sunlight. If vials or syringes are left out of the refrigerator for more than 24 hours, they must be thrown away. Throw away unused vials after the expiration date on the carton. NOTE: This sheet is a summary. It may not cover all possible information. If you have questions about this medicine, talk to your doctor, pharmacist, or health care provider.  2014, Elsevier/Gold Standard. (2007-11-30 13:33:21)  

## 2014-03-01 ENCOUNTER — Encounter (HOSPITAL_COMMUNITY): Payer: Self-pay

## 2014-03-01 ENCOUNTER — Encounter (HOSPITAL_COMMUNITY)
Admission: RE | Admit: 2014-03-01 | Discharge: 2014-03-01 | Disposition: A | Discharge status: Home / Self Care | Payer: Medicare Other | Source: Ambulatory Visit | Attending: Nephrology | Admitting: Nephrology

## 2014-03-01 HISTORY — DX: Personal history of other diseases of the musculoskeletal system and connective tissue: Z87.39

## 2014-03-01 HISTORY — DX: Personal history of other diseases of urinary system: Z87.448

## 2014-03-01 LAB — CBC WITH DIFFERENTIAL/PLATELET
BASOS ABS: 0 10*3/uL (ref 0.0–0.1)
Basophils Relative: 3 % — ABNORMAL HIGH (ref 0–1)
EOS PCT: 0 % (ref 0–5)
Eosinophils Absolute: 0 10*3/uL (ref 0.0–0.7)
HCT: 24.6 % — ABNORMAL LOW (ref 39.0–52.0)
Hemoglobin: 8.6 g/dL — ABNORMAL LOW (ref 13.0–17.0)
LYMPHS ABS: 0.2 10*3/uL — AB (ref 0.7–4.0)
LYMPHS PCT: 27 % (ref 12–46)
MCH: 31.7 pg (ref 26.0–34.0)
MCHC: 35 g/dL (ref 30.0–36.0)
MCV: 90.8 fL (ref 78.0–100.0)
MONOS PCT: 62 % — AB (ref 3–12)
Monocytes Absolute: 0.4 10*3/uL (ref 0.1–1.0)
Neutro Abs: 0 10*3/uL — ABNORMAL LOW (ref 1.7–7.7)
Neutrophils Relative %: 8 % — ABNORMAL LOW (ref 43–77)
PLATELETS: 177 10*3/uL (ref 150–400)
RBC: 2.71 MIL/uL — AB (ref 4.22–5.81)
RDW: 24.9 % — ABNORMAL HIGH (ref 11.5–15.5)
WBC: 0.6 10*3/uL — AB (ref 4.0–10.5)
nRBC: 9 /100 WBC — ABNORMAL HIGH

## 2014-03-01 MED ORDER — FILGRASTIM 480 MCG/1.6ML IJ SOLN
450.0000 ug | Freq: Every day | INTRAMUSCULAR | Status: DC
  Administered 2014-03-01: 11:00:00 via SUBCUTANEOUS
  Filled 2014-03-01 (×2): qty 1.6

## 2014-03-01 NOTE — Progress Notes (Signed)
Spoke to [place] at Dr. Serita Grit office. Per Dr. Posey Pronto if [place] is greater than 1.0 for 3 consecutive days then hold Neupogen injection. Informed Christine pharmacist. Patient is only scheduled to get three more doses here at [facility] with current order. ANC was 0.0 today.

## 2014-03-01 NOTE — Progress Notes (Signed)
Pharmacy inquired if there were any high parameters for patient's labs (specifically ANC) to receive/not receive Neupogen. RN called and left message for Dr. Serita Grit nurse. Awaiting return call. Did receive dose today. Will clarify above for pharmacy as patient will be here for three more days for daily dose.

## 2014-03-02 ENCOUNTER — Encounter (HOSPITAL_COMMUNITY)
Admission: RE | Admit: 2014-03-02 | Discharge: 2014-03-02 | Disposition: A | Discharge status: Home / Self Care | Payer: Medicare Other | Source: Ambulatory Visit | Attending: Nephrology | Admitting: Nephrology

## 2014-03-02 ENCOUNTER — Encounter (HOSPITAL_COMMUNITY): Payer: Self-pay

## 2014-03-02 ENCOUNTER — Other Ambulatory Visit (HOSPITAL_COMMUNITY): Payer: Self-pay | Admitting: Nephrology

## 2014-03-02 LAB — CBC WITH DIFFERENTIAL/PLATELET
BASOS PCT: 1 % (ref 0–1)
Basophils Absolute: 0 10*3/uL (ref 0.0–0.1)
EOS ABS: 0 10*3/uL (ref 0.0–0.7)
Eosinophils Relative: 0 % (ref 0–5)
HCT: 25.7 % — ABNORMAL LOW (ref 39.0–52.0)
Hemoglobin: 8.9 g/dL — ABNORMAL LOW (ref 13.0–17.0)
LYMPHS PCT: 9 % — AB (ref 12–46)
Lymphs Abs: 0.2 10*3/uL — ABNORMAL LOW (ref 0.7–4.0)
MCH: 31.9 pg (ref 26.0–34.0)
MCHC: 34.6 g/dL (ref 30.0–36.0)
MCV: 92.1 fL (ref 78.0–100.0)
MONO ABS: 0.7 10*3/uL (ref 0.1–1.0)
Monocytes Relative: 31 % — ABNORMAL HIGH (ref 3–12)
NEUTROS PCT: 59 % (ref 43–77)
Neutro Abs: 1.4 10*3/uL — ABNORMAL LOW (ref 1.7–7.7)
Platelets: 172 10*3/uL (ref 150–400)
RBC: 2.79 MIL/uL — AB (ref 4.22–5.81)
RDW: 24.9 % — ABNORMAL HIGH (ref 11.5–15.5)
WBC: 2.3 10*3/uL — ABNORMAL LOW (ref 4.0–10.5)
nRBC: 4 /100 WBC — ABNORMAL HIGH

## 2014-03-02 MED ORDER — FILGRASTIM 480 MCG/1.6ML IJ SOLN
450.0000 ug | Freq: Once | INTRAMUSCULAR | Status: AC
  Administered 2014-03-02: 450 ug via SUBCUTANEOUS
  Filled 2014-03-02: qty 1.6

## 2014-03-03 ENCOUNTER — Other Ambulatory Visit (HOSPITAL_COMMUNITY): Payer: Self-pay | Admitting: Nephrology

## 2014-03-03 ENCOUNTER — Encounter (HOSPITAL_COMMUNITY)
Admission: RE | Admit: 2014-03-03 | Discharge: 2014-03-03 | Disposition: A | Discharge status: Home / Self Care | Payer: Medicare Other | Source: Ambulatory Visit | Attending: Nephrology | Admitting: Nephrology

## 2014-03-03 LAB — CBC WITH DIFFERENTIAL/PLATELET
BASOS PCT: 1 % (ref 0–1)
Basophils Absolute: 0.1 10*3/uL (ref 0.0–0.1)
Eosinophils Absolute: 0 10*3/uL (ref 0.0–0.7)
Eosinophils Relative: 0 % (ref 0–5)
HCT: 26.4 % — ABNORMAL LOW (ref 39.0–52.0)
HEMOGLOBIN: 9 g/dL — AB (ref 13.0–17.0)
Lymphocytes Relative: 6 % — ABNORMAL LOW (ref 12–46)
Lymphs Abs: 0.5 10*3/uL — ABNORMAL LOW (ref 0.7–4.0)
MCH: 31.5 pg (ref 26.0–34.0)
MCHC: 34.1 g/dL (ref 30.0–36.0)
MCV: 92.3 fL (ref 78.0–100.0)
Monocytes Absolute: 1.4 10*3/uL — ABNORMAL HIGH (ref 0.1–1.0)
Monocytes Relative: 17 % — ABNORMAL HIGH (ref 3–12)
NEUTROS ABS: 6.4 10*3/uL (ref 1.7–7.7)
NEUTROS PCT: 76 % (ref 43–77)
Platelets: 201 10*3/uL (ref 150–400)
RBC: 2.86 MIL/uL — ABNORMAL LOW (ref 4.22–5.81)
RDW: 24.9 % — AB (ref 11.5–15.5)
WBC: 8.5 10*3/uL (ref 4.0–10.5)

## 2014-03-03 MED ORDER — FILGRASTIM 480 MCG/1.6ML IJ SOLN
450.0000 ug | Freq: Every day | INTRAMUSCULAR | Status: AC
  Administered 2014-03-03: 450 ug via SUBCUTANEOUS
  Filled 2014-03-03 (×2): qty 1.6

## 2014-03-04 ENCOUNTER — Encounter (HOSPITAL_COMMUNITY)
Admission: RE | Admit: 2014-03-04 | Discharge: 2014-03-04 | Disposition: A | Discharge status: Home / Self Care | Payer: Medicare Other | Source: Ambulatory Visit | Attending: Nephrology | Admitting: Nephrology

## 2014-03-04 LAB — CBC WITH DIFFERENTIAL/PLATELET
Basophils Absolute: 0.2 10*3/uL — ABNORMAL HIGH (ref 0.0–0.1)
Basophils Relative: 1 % (ref 0–1)
Eosinophils Absolute: 0 10*3/uL (ref 0.0–0.7)
Eosinophils Relative: 0 % (ref 0–5)
HCT: 25.1 % — ABNORMAL LOW (ref 39.0–52.0)
Hemoglobin: 8.5 g/dL — ABNORMAL LOW (ref 13.0–17.0)
Lymphocytes Relative: 2 % — ABNORMAL LOW (ref 12–46)
Lymphs Abs: 0.3 10*3/uL — ABNORMAL LOW (ref 0.7–4.0)
MCH: 31.4 pg (ref 26.0–34.0)
MCHC: 33.9 g/dL (ref 30.0–36.0)
MCV: 92.6 fL (ref 78.0–100.0)
MONOS PCT: 13 % — AB (ref 3–12)
Monocytes Absolute: 2 10*3/uL — ABNORMAL HIGH (ref 0.1–1.0)
NEUTROS PCT: 84 % — AB (ref 43–77)
Neutro Abs: 12.8 10*3/uL — ABNORMAL HIGH (ref 1.7–7.7)
PLATELETS: 191 10*3/uL (ref 150–400)
RBC: 2.71 MIL/uL — AB (ref 4.22–5.81)
RDW: 25.3 % — ABNORMAL HIGH (ref 11.5–15.5)
WBC: 15.3 10*3/uL — AB (ref 4.0–10.5)

## 2014-03-04 NOTE — Progress Notes (Signed)
03/04/14 1325  Patient in for neupogen injection.  CBC with diff was drawn. WBC 15.3 HGB 8.5 Neut 12.8. No injection given today.

## 2014-03-05 ENCOUNTER — Ambulatory Visit (INDEPENDENT_AMBULATORY_CARE_PROVIDER_SITE_OTHER): Payer: Medicare Other | Admitting: Interventional Cardiology

## 2014-03-05 ENCOUNTER — Other Ambulatory Visit (HOSPITAL_COMMUNITY): Payer: Self-pay | Admitting: Nephrology

## 2014-03-05 ENCOUNTER — Encounter: Payer: Self-pay | Admitting: Interventional Cardiology

## 2014-03-05 ENCOUNTER — Ambulatory Visit (HOSPITAL_COMMUNITY): Admission: RE | Admit: 2014-03-05 | Payer: Medicare Other | Source: Ambulatory Visit

## 2014-03-05 VITALS — BP 110/65 | HR 56 | Ht 70.5 in | Wt 182.1 lb

## 2014-03-05 DIAGNOSIS — R0602 Shortness of breath: Secondary | ICD-10-CM

## 2014-03-05 DIAGNOSIS — I5032 Chronic diastolic (congestive) heart failure: Secondary | ICD-10-CM

## 2014-03-05 DIAGNOSIS — I10 Essential (primary) hypertension: Secondary | ICD-10-CM

## 2014-03-05 DIAGNOSIS — E785 Hyperlipidemia, unspecified: Secondary | ICD-10-CM

## 2014-03-05 DIAGNOSIS — I4892 Unspecified atrial flutter: Secondary | ICD-10-CM

## 2014-03-05 LAB — BASIC METABOLIC PANEL
BUN: 38 mg/dL — ABNORMAL HIGH (ref 6–23)
CHLORIDE: 103 meq/L (ref 96–112)
CO2: 29 mEq/L (ref 19–32)
CREATININE: 1.7 mg/dL — AB (ref 0.4–1.5)
Calcium: 9.5 mg/dL (ref 8.4–10.5)
GFR: 42.09 mL/min — ABNORMAL LOW (ref >60.00)
Glucose, Bld: 85 mg/dL (ref 70–99)
POTASSIUM: 4.6 meq/L (ref 3.5–5.1)
Sodium: 138 mEq/L (ref 135–145)

## 2014-03-05 LAB — BRAIN NATRIURETIC PEPTIDE: PRO B NATRI PEPTIDE: 573 pg/mL — AB (ref 0.0–100.0)

## 2014-03-05 NOTE — Patient Instructions (Signed)
Your physician recommends that you continue on your current medications as directed. Please refer to the Current Medication list given to you today.  Your physician recommends that you return for lab today for bnp and bmet.   Your physician wants you to follow-up in: 6 months with Dr. Irish Lack. You will receive a reminder letter in the mail two months in advance. If you don't receive a letter, please call our office to schedule the follow-up appointment.

## 2014-03-05 NOTE — Progress Notes (Signed)
Patient ID: Bryan Hubbard, male   DOB: [birth year], 73 y.o.   MRN: [id number]    [place], [place], [place]  [address] Phone: [phone] Fax:  [phone]  Date:  03/05/2014   ID:  Bryan Hubbard, DOB Jul 22, 1941, MRN [id number]  PCP:  Bryan Amel, MD      History of Present Illness: Bryan Hubbard is a 72 y.o. male who has had AFlutter, chronic diastolic HF, and DVT/PE. He was diagnosed Goodpastures syndrome in April and had to go the hospital 16x in the past 6 weeks for plasmapheresis.  Feels heart flutters significantly from 30sec to 90sec when lying in back w/o chest pain. Has SHOB all the time now secondary to Goodpastures syndrome. Has  short-lived palpitations about 5 to 6x per day only with lying still, but this is not bothersome or limiting his life. Has some lightheadedness occasionally when bending down or changing position. Still work in the yard, exercises, dumbbell exercises (12.5lb each), and brisk walking daily (about 30 min).  No hemoptysis.   Echo showed normal LV function with decreased RV function.  SHOB that he has now is better.  In March , BNP was 5300.    Denies: Chest pain.  Leg edema.  Orthopnea.  Syncope PND Bleeding     Wt Readings from Last 3 Encounters:  03/05/14 182 lb 1.9 oz (82.609 kg)  01/24/14 184 lb 15.5 oz (83.9 kg)  01/23/14 176 lb (79.833 kg)     Past Medical History  Diagnosis Date  . Atrial fibrillation   . Chronic diastolic heart failure   . DVT   . HYPERLIPIDEMIA   . HYPERTENSION, BENIGN   . ALLERGIC ASTHMA   . Palpitations   . H/O Goodpasture's syndrome     Current Outpatient Prescriptions  Medication Sig Dispense Refill  . bimatoprost (LUMIGAN) 0.01 % SOLN Place 1 drop into both eyes every morning.      . cyanocobalamin (,VITAMIN B-12,) 1000 MCG/ML injection Inject 1,000 mcg into the muscle every 30 (thirty) days.       . cyclobenzaprine (FLEXERIL) 5 MG tablet Take 5 mg by mouth at bedtime.      .  Dutasteride-Tamsulosin HCl 0.5-0.4 MG CAPS Take 1 capsule by mouth daily.      . ferrous fumarate-iron polysaccharide complex (TANDEM) 162-115.2 MG CAPS Take 1 capsule by mouth daily with breakfast. One capsule/daily of Integra (120 mg iron and 40 mg Vit C)      . furosemide (LASIX) 20 MG tablet Take 1 tablet (20 mg total) by mouth daily.  30 tablet  0  . Magnesium 250 MG TABS Take 1 tablet by mouth every evening.      . metoprolol tartrate (LOPRESSOR) 25 MG tablet Take 25 mg by mouth 2 (two) times daily.      . montelukast (SINGULAIR) 10 MG tablet Take 10 mg by mouth at bedtime.       . Multiple Vitamin (MULTIVITAMIN) tablet Take 1 tablet by mouth daily.      Marland Kitchen omeprazole (PRILOSEC) 20 MG capsule Take 20 mg by mouth daily.       . traMADol (ULTRAM) 50 MG tablet Take 1 tablet (50 mg total) by mouth every 12 (twelve) hours as needed.  30 tablet  0  . vitamin C (ASCORBIC ACID) 500 MG tablet Take 500 mg by mouth daily.      . Vitamin D, Cholecalciferol, 1000 UNITS TABS Take 2 capsules by mouth daily.      Marland Kitchen  warfarin (COUMADIN) 4 MG tablet Take 4 mg by mouth daily.       No current facility-administered medications for this visit.    Allergies:   No Known Allergies  Social History:  The patient  reports that he has quit smoking. He does not have any smokeless tobacco history on file.   Family History:  The patient's family history includes Heart attack in his maternal grandmother; Stroke in his mother.   ROS:  Please see the history of present illness.  No nausea, vomiting.  No fevers, chills.  No focal weakness.  No dysuria. SHOB with exertion.  All other systems reviewed and negative.   PHYSICAL EXAM: VS:  BP 110/65  Pulse 56  Ht 5' 10.5" (1.791 m)  Wt 182 lb 1.9 oz (82.609 kg)  BMI 25.75 kg/m2 Well nourished, well developed, in no acute distress HEENT: normal Neck: no JVD, no carotid bruits Cardiac:  normal S1, S2; irregular, normal rate Lungs:  clear to auscultation bilaterally, no  wheezing, rhonchi or rales Abd: soft, nontender, no hepatomegaly Ext: no edema Skin: warm and dry Neuro:   no focal abnormalities noted  EKG:  Flutter, rate controlled in 3/15.  ASSESSMENT AND PLAN:  Atrial fibrillation  Continue Metoprolol Tartrate Tablet, 25 MG, 1 tablet, Orally, Twice a day  IMAGING: EKG    Bryan Hubbard 02/27/2013 11:47:52 AM > Bryan Hubbard,[name] 02/27/2013 12:23:17 PM > Atrial flutter, rate controlled    Notes: Flutter. Rate controlled. Stroke prevention with Coumadin. Has seen Dr. Lovena Le in the past regarding ablation. Not needed at this time. we discussed changing anticoagulation due to the inconvenience of Coumadin. We will check with his insurance company as to whether Eliquis or Xarelto would be affordable.    2. Chronic diastolic heart failure /SHOB Decreased Furosemide Tablet, 20MG , 1 tablet, Orally, two-four days a week Notes: Decrease frequency of furosemide as tolerated. For the most part, he is taking this twice a week. he is  having significantshortness of breath. Check BNP.  If increased from 3/15, may need to increase to 40 mg daily.    3. Essential hypertension, benign  Off of Quinapril HCl Tablet, 40 MG, 1/2 tablet, Orally, once daily due to Goodpastures. Notes: Controlled at home.    4. Combined hyperlipidemia  Simvastatin held with the Goodpastures.   Notes: Lipids were controlled in May but he had cramps with Crestor. Back on Vytorin. Vit D level normal. Now off of CoEnzyme Q10 200 mg daily which  Held .  Negative nuclear stress test in 2011.     Labs    Lab: CBC without Diff (Ordered for 03/10/2013)   WBC  5.4   4.0-11.0 - K/ul   RBC  4.14  L  4.20-5.80 - M/uL   HCT  39.4   39.0-52.0 - %   HGB  13.2   13.0-17.0 - g/dL   MCV  95.3  H  80.0-94.0 - fL   MCH  31.9   27.0-33.0 - pg   MCHC  33.4   32.0-36.0 - g/dL   RDW  13.3   11.5-15.5 - %   PLT  222   150-400 - K/uL    Bryan Hubbard,[name] 03/10/2013 05:31:32 PM > hbg stable Bryan Hubbard 03/13/2013  09:04:05 AM > Pt notified.         Lab: Statin Panel (Ordered for 03/10/2013)   ALP  65   38-126 - U/L   ALT  29   0-52 - U/L   AST  31  0-39 - U/L   CHOLESTEROL  167   <200 - mg/dL   TRIG  157   0-199 - mg/dL   DLDL  103  H  0-99 - mg/dL   NON-HDL  120   0-129 - mg/dL   DIRECT HDL  47   30-70 - mg/dL   CHOL/HDL  3.6   2.0-4.0 - Ratio    Elija Mccamish,[name] 03/10/2013 05:31:59 PM > lipids, liver stable Bryan Hubbard 03/13/2013 09:04:35 AM > Pt notified.       Signed, Mina Marble, MD, [facility] 03/05/2014 9:32 AM

## 2014-03-06 ENCOUNTER — Encounter (HOSPITAL_COMMUNITY): Admission: RE | Admit: 2014-03-06 | Payer: Medicare Other | Source: Ambulatory Visit

## 2014-03-07 ENCOUNTER — Inpatient Hospital Stay (HOSPITAL_COMMUNITY): Admission: RE | Admit: 2014-03-07 | Payer: Medicare Other | Source: Ambulatory Visit

## 2014-03-08 ENCOUNTER — Encounter (HOSPITAL_COMMUNITY): Payer: Medicare Other

## 2014-03-09 ENCOUNTER — Encounter (HOSPITAL_COMMUNITY): Payer: Medicare Other

## 2014-03-16 ENCOUNTER — Observation Stay (HOSPITAL_COMMUNITY)
Admission: EM | Admit: 2014-03-16 | Discharge: 2014-03-18 | Disposition: A | Discharge status: Home / Self Care | Payer: Medicare Other | Attending: Interventional Cardiology | Admitting: Interventional Cardiology

## 2014-03-16 ENCOUNTER — Encounter (HOSPITAL_COMMUNITY): Payer: Self-pay | Admitting: Emergency Medicine

## 2014-03-16 ENCOUNTER — Telehealth: Payer: Self-pay | Admitting: Interventional Cardiology

## 2014-03-16 ENCOUNTER — Emergency Department (HOSPITAL_COMMUNITY): Payer: Medicare Other

## 2014-03-16 DIAGNOSIS — D638 Anemia in other chronic diseases classified elsewhere: Secondary | ICD-10-CM | POA: Diagnosis present

## 2014-03-16 DIAGNOSIS — I4891 Unspecified atrial fibrillation: Principal | ICD-10-CM | POA: Insufficient documentation

## 2014-03-16 DIAGNOSIS — M310 Hypersensitivity angiitis: Secondary | ICD-10-CM | POA: Insufficient documentation

## 2014-03-16 DIAGNOSIS — I5033 Acute on chronic diastolic (congestive) heart failure: Secondary | ICD-10-CM | POA: Diagnosis present

## 2014-03-16 DIAGNOSIS — R109 Unspecified abdominal pain: Secondary | ICD-10-CM | POA: Diagnosis present

## 2014-03-16 DIAGNOSIS — R42 Dizziness and giddiness: Secondary | ICD-10-CM | POA: Insufficient documentation

## 2014-03-16 DIAGNOSIS — R000 Tachycardia, unspecified: Secondary | ICD-10-CM | POA: Insufficient documentation

## 2014-03-16 DIAGNOSIS — I483 Typical atrial flutter: Secondary | ICD-10-CM

## 2014-03-16 DIAGNOSIS — I5032 Chronic diastolic (congestive) heart failure: Secondary | ICD-10-CM | POA: Insufficient documentation

## 2014-03-16 DIAGNOSIS — N059 Unspecified nephritic syndrome with unspecified morphologic changes: Secondary | ICD-10-CM

## 2014-03-16 DIAGNOSIS — I10 Essential (primary) hypertension: Secondary | ICD-10-CM

## 2014-03-16 DIAGNOSIS — Z87891 Personal history of nicotine dependence: Secondary | ICD-10-CM | POA: Insufficient documentation

## 2014-03-16 DIAGNOSIS — Z79899 Other long term (current) drug therapy: Secondary | ICD-10-CM | POA: Insufficient documentation

## 2014-03-16 DIAGNOSIS — Z86718 Personal history of other venous thrombosis and embolism: Secondary | ICD-10-CM | POA: Insufficient documentation

## 2014-03-16 DIAGNOSIS — E785 Hyperlipidemia, unspecified: Secondary | ICD-10-CM

## 2014-03-16 DIAGNOSIS — I4892 Unspecified atrial flutter: Secondary | ICD-10-CM

## 2014-03-16 DIAGNOSIS — Z7901 Long term (current) use of anticoagulants: Secondary | ICD-10-CM | POA: Insufficient documentation

## 2014-03-16 HISTORY — DX: Gastric ulcer, unspecified as acute or chronic, without hemorrhage or perforation: K25.9

## 2014-03-16 HISTORY — DX: Personal history of other medical treatment: Z92.89

## 2014-03-16 HISTORY — DX: Other pulmonary embolism without acute cor pulmonale: I26.99

## 2014-03-16 HISTORY — DX: Unspecified atrial flutter: I48.92

## 2014-03-16 HISTORY — DX: Acute on chronic diastolic (congestive) heart failure: I50.33

## 2014-03-16 HISTORY — DX: Unspecified osteoarthritis, unspecified site: M19.90

## 2014-03-16 HISTORY — DX: Acute embolism and thrombosis of unspecified deep veins of unspecified lower extremity: I82.409

## 2014-03-16 HISTORY — DX: Disorder of kidney and ureter, unspecified: N28.9

## 2014-03-16 HISTORY — DX: Gout, unspecified: M10.9

## 2014-03-16 HISTORY — DX: Anemia, unspecified: D64.9

## 2014-03-16 HISTORY — DX: Heart failure, unspecified: I50.9

## 2014-03-16 LAB — CBC
HCT: 24.6 % — ABNORMAL LOW (ref 39.0–52.0)
Hemoglobin: 8.5 g/dL — ABNORMAL LOW (ref 13.0–17.0)
MCH: 32.4 pg (ref 26.0–34.0)
MCHC: 34.6 g/dL (ref 30.0–36.0)
MCV: 93.9 fL (ref 78.0–100.0)
PLATELETS: 261 10*3/uL (ref 150–400)
RBC: 2.62 MIL/uL — ABNORMAL LOW (ref 4.22–5.81)
RDW: 24.5 % — AB (ref 11.5–15.5)
WBC: 6.8 10*3/uL (ref 4.0–10.5)

## 2014-03-16 LAB — BASIC METABOLIC PANEL
BUN: 34 mg/dL — ABNORMAL HIGH (ref 6–23)
CALCIUM: 9.5 mg/dL (ref 8.4–10.5)
CO2: 22 mEq/L (ref 19–32)
CREATININE: 1.66 mg/dL — AB (ref 0.50–1.35)
Chloride: 94 mEq/L — ABNORMAL LOW (ref 96–112)
GFR calc Af Amer: 46 mL/min — ABNORMAL LOW (ref >90)
GFR calc non Af Amer: 39 mL/min — ABNORMAL LOW (ref >90)
Glucose, Bld: 149 mg/dL — ABNORMAL HIGH (ref 70–99)
Potassium: 4.1 mEq/L (ref 3.7–5.3)
Sodium: 134 mEq/L — ABNORMAL LOW (ref 137–147)

## 2014-03-16 LAB — I-STAT TROPONIN, ED: TROPONIN I, POC: 0.06 ng/mL (ref 0.00–0.08)

## 2014-03-16 LAB — PROTIME-INR
INR: 1.93 — ABNORMAL HIGH (ref 0.00–1.49)
Prothrombin Time: 21.5 seconds — ABNORMAL HIGH (ref 11.6–15.2)

## 2014-03-16 LAB — TROPONIN I: Troponin I: <0.3 ng/mL (ref <0.30)

## 2014-03-16 MED ORDER — MONTELUKAST SODIUM 10 MG PO TABS
10.0000 mg | ORAL_TABLET | Freq: Every day | ORAL | Status: DC
  Administered 2014-03-16 – 2014-03-17 (×2): 10 mg via ORAL
  Filled 2014-03-16 (×3): qty 1

## 2014-03-16 MED ORDER — FILGRASTIM 480 MCG/1.6ML IJ SOLN: 450.0000 ug | Freq: Once | INTRAMUSCULAR | Status: DC

## 2014-03-16 MED ORDER — PANTOPRAZOLE SODIUM 40 MG PO TBEC
40.0000 mg | DELAYED_RELEASE_TABLET | Freq: Every day | ORAL | Status: DC
  Administered 2014-03-17 – 2014-03-18 (×2): 40 mg via ORAL
  Filled 2014-03-16 (×2): qty 1

## 2014-03-16 MED ORDER — VITAMIN D3 25 MCG (1000 UNIT) PO TABS
2000.0000 [IU] | ORAL_TABLET | Freq: Every day | ORAL | Status: DC
  Administered 2014-03-17 – 2014-03-18 (×2): 2000 [IU] via ORAL
  Filled 2014-03-16 (×2): qty 2

## 2014-03-16 MED ORDER — WARFARIN - PHYSICIAN DOSING INPATIENT
Freq: Every day | Status: DC
  Administered 2014-03-17: 18:00:00

## 2014-03-16 MED ORDER — CYCLOPHOSPHAMIDE 50 MG PO TABS
100.0000 mg | ORAL_TABLET | Freq: Every day | ORAL | Status: DC
  Administered 2014-03-17 – 2014-03-18 (×2): 100 mg via ORAL
  Filled 2014-03-16 (×2): qty 2

## 2014-03-16 MED ORDER — VITAMIN D (CHOLECALCIFEROL) 25 MCG (1000 UT) PO TABS: 2.0000 | ORAL_TABLET | Freq: Every day | ORAL | Status: DC

## 2014-03-16 MED ORDER — DILTIAZEM HCL 25 MG/5ML IV SOLN
10.0000 mg | Freq: Once | INTRAVENOUS | Status: AC
  Administered 2014-03-16: 10 mg via INTRAVENOUS
  Filled 2014-03-16: qty 5

## 2014-03-16 MED ORDER — CYANOCOBALAMIN 1000 MCG/ML IJ SOLN: 1000.0000 ug | INTRAMUSCULAR | Status: DC

## 2014-03-16 MED ORDER — PREDNISONE 10 MG PO TABS
30.0000 mg | ORAL_TABLET | Freq: Every day | ORAL | Status: DC
  Administered 2014-03-17 – 2014-03-18 (×2): 30 mg via ORAL
  Filled 2014-03-16 (×3): qty 1

## 2014-03-16 MED ORDER — LATANOPROST 0.005 % OP SOLN
1.0000 [drp] | OPHTHALMIC | Status: DC
  Administered 2014-03-17 – 2014-03-18 (×2): 1 [drp] via OPHTHALMIC
  Filled 2014-03-16: qty 2.5

## 2014-03-16 MED ORDER — METOPROLOL TARTRATE 25 MG PO TABS
25.0000 mg | ORAL_TABLET | Freq: Two times a day (BID) | ORAL | Status: DC
  Administered 2014-03-16: 25 mg via ORAL
  Filled 2014-03-16 (×3): qty 1

## 2014-03-16 MED ORDER — FILGRASTIM 480 MCG/1.6ML IJ SOLN
450.0000 ug | Freq: Every day | INTRAMUSCULAR | Status: DC
  Filled 2014-03-16: qty 1.6

## 2014-03-16 MED ORDER — TRAMADOL HCL 50 MG PO TABS
50.0000 mg | ORAL_TABLET | Freq: Two times a day (BID) | ORAL | Status: DC | PRN
  Administered 2014-03-16 – 2014-03-17 (×2): 50 mg via ORAL
  Filled 2014-03-16 (×2): qty 1

## 2014-03-16 MED ORDER — FUROSEMIDE 20 MG PO TABS
20.0000 mg | ORAL_TABLET | Freq: Every day | ORAL | Status: DC
  Administered 2014-03-17 – 2014-03-18 (×2): 20 mg via ORAL
  Filled 2014-03-16 (×2): qty 1

## 2014-03-16 MED ORDER — DILTIAZEM HCL 100 MG IV SOLR
5.0000 mg/h | INTRAVENOUS | Status: DC
  Administered 2014-03-16: 10 mg/h via INTRAVENOUS
  Administered 2014-03-17 (×2): 15 mg/h via INTRAVENOUS
  Filled 2014-03-16 (×2): qty 100

## 2014-03-16 MED ORDER — FERROUS FUM-IRON POLYSACCH 162-115.2 MG PO CAPS: 1.0000 | ORAL_CAPSULE | Freq: Every day | ORAL | Status: DC

## 2014-03-16 MED ORDER — WARFARIN SODIUM 4 MG PO TABS
4.0000 mg | ORAL_TABLET | Freq: Every day | ORAL | Status: DC
  Administered 2014-03-16 – 2014-03-17 (×2): 4 mg via ORAL
  Filled 2014-03-16 (×3): qty 1

## 2014-03-16 MED ORDER — DUTASTERIDE 0.5 MG PO CAPS
0.5000 mg | ORAL_CAPSULE | Freq: Every day | ORAL | Status: DC
  Administered 2014-03-17 – 2014-03-18 (×2): 0.5 mg via ORAL
  Filled 2014-03-16 (×3): qty 1

## 2014-03-16 MED ORDER — FERROUS FUMARATE 325 (106 FE) MG PO TABS
1.0000 | ORAL_TABLET | Freq: Every day | ORAL | Status: DC
  Administered 2014-03-17 – 2014-03-18 (×2): 106 mg via ORAL
  Filled 2014-03-16 (×3): qty 1

## 2014-03-16 MED ORDER — DUTASTERIDE-TAMSULOSIN HCL 0.5-0.4 MG PO CAPS: 1.0000 | ORAL_CAPSULE | Freq: Every day | ORAL | Status: DC

## 2014-03-16 MED ORDER — TAMSULOSIN HCL 0.4 MG PO CAPS
0.4000 mg | ORAL_CAPSULE | Freq: Every day | ORAL | Status: DC
  Administered 2014-03-17 – 2014-03-18 (×2): 0.4 mg via ORAL
  Filled 2014-03-16 (×3): qty 1

## 2014-03-16 MED ORDER — CYCLOBENZAPRINE HCL 5 MG PO TABS
5.0000 mg | ORAL_TABLET | Freq: Every day | ORAL | Status: DC
  Administered 2014-03-16 – 2014-03-17 (×2): 5 mg via ORAL
  Filled 2014-03-16 (×3): qty 1

## 2014-03-16 NOTE — Telephone Encounter (Signed)
I spoke with pt who calls b/c of increasing tiredness & states his bp meter records his heart rate as 152. It may be the meter picking up the flutter Pt states he feels good. Staying well hydrated since having an upset stomach with dry heaves last night.  I did offer him to come in for an ekg but he is seeing his pcp today. He will have them do an ekg & evaluate.  Forwarded to Dr. Irish Lack & [place]

## 2014-03-16 NOTE — ED Provider Notes (Addendum)
CSN: [id number]     Arrival date & time 03/16/14  1724 History   First MD Initiated Contact with Patient 03/16/14 1756     Chief Complaint  Patient presents with  . AFib      (Consider location/radiation/quality/duration/timing/severity/associated sxs/prior Treatment) Patient is a 73 y.o. male presenting with palpitations. The history is provided by the patient.  Palpitations Palpitations quality:  Fast Onset quality:  Sudden Timing:  Constant Progression:  Unchanged Chronicity:  New Context comment:  States last night had episode of abd discomfort and dry heaves and vomiting that has since resolved but noticed palpitations after that episode and has persisted Relieved by:  Nothing Exacerbated by: standing and trying to walk. Ineffective treatments: rest. Associated symptoms: dizziness   Associated symptoms: no chest pain, no chest pressure, no diaphoresis, no shortness of breath, no vomiting and no weakness   Associated symptoms comment:  Seen by PCP and found to be in a.fib rvr with HR in the 140's.  At home when pt checked his HR was 150 with normal BP. Risk factors: hx of atrial fibrillation   Risk factors comment:  Goodpastures, anemia   Past Medical History  Diagnosis Date  . Atrial fibrillation   . Chronic diastolic heart failure   . DVT   . HYPERLIPIDEMIA   . HYPERTENSION, BENIGN   . ALLERGIC ASTHMA   . Palpitations   . H/O Goodpasture's syndrome    Past Surgical History  Procedure Laterality Date  . Bone marrow biopsy     Family History  Problem Relation Age of Onset  . Stroke Mother   . Heart attack Maternal Grandmother    History  Substance Use Topics  . Smoking status: Former Research scientist (life sciences)  . Smokeless tobacco: Not on file  . Alcohol Use: Not on file    Review of Systems  Constitutional: Negative for diaphoresis.  Respiratory: Negative for shortness of breath.   Cardiovascular: Positive for palpitations. Negative for chest pain.  Gastrointestinal:  Negative for vomiting.  Neurological: Positive for dizziness.  All other systems reviewed and are negative.     Allergies  Review of patient's allergies indicates no known allergies.  Home Medications   Prior to Admission medications   Medication Sig Start Date End Date Taking? Authorizing Provider  bimatoprost (LUMIGAN) 0.01 % SOLN Place 1 drop into both eyes every morning.    Historical Provider, MD  cyanocobalamin (,VITAMIN B-12,) 1000 MCG/ML injection Inject 1,000 mcg into the muscle every 30 (thirty) days.     Historical Provider, MD  cyclobenzaprine (FLEXERIL) 5 MG tablet Take 5 mg by mouth at bedtime.    Historical Provider, MD  Dutasteride-Tamsulosin HCl 0.5-0.4 MG CAPS Take 1 capsule by mouth daily.    Historical Provider, MD  ferrous fumarate-iron polysaccharide complex (TANDEM) 162-115.2 MG CAPS Take 1 capsule by mouth daily with breakfast. One capsule/daily of Integra (120 mg iron and 40 mg Vit C)    Historical Provider, MD  furosemide (LASIX) 20 MG tablet Take 1 tablet (20 mg total) by mouth daily. 12/30/13   Belkys A Regalado, MD  metoprolol tartrate (LOPRESSOR) 25 MG tablet Take 25 mg by mouth 2 (two) times daily. 06/15/13   Historical Provider, MD  montelukast (SINGULAIR) 10 MG tablet Take 10 mg by mouth at bedtime.  06/10/13   Historical Provider, MD  Multiple Vitamin (MULTIVITAMIN) tablet Take 1 tablet by mouth daily.    Historical Provider, MD  omeprazole (PRILOSEC) 20 MG capsule Take 20 mg by mouth daily.  08/17/13   Historical Provider, MD  traMADol (ULTRAM) 50 MG tablet Take 1 tablet (50 mg total) by mouth every 12 (twelve) hours as needed. 12/30/13   Belkys A Regalado, MD  vitamin C (ASCORBIC ACID) 500 MG tablet Take 500 mg by mouth daily.    Historical Provider, MD  Vitamin D, Cholecalciferol, 1000 UNITS TABS Take 2 capsules by mouth daily.    Historical Provider, MD  warfarin (COUMADIN) 4 MG tablet Take 4 mg by mouth daily.    Historical Provider, MD   BP 121/80   Pulse 140  Temp(Src) 98.9 F (37.2 C) (Oral)  Resp 20  SpO2 100% Physical Exam  Nursing note and vitals reviewed. Constitutional: He is oriented to person, place, and time. He appears well-developed and well-nourished. No distress.  HENT:  Head: Normocephalic and atraumatic.  Mouth/Throat: Oropharynx is clear and moist.  Eyes: Conjunctivae and EOM are normal. Pupils are equal, round, and reactive to light.  Neck: Normal range of motion. Neck supple.  Cardiovascular: Intact distal pulses.  An irregularly irregular rhythm present. Tachycardia present.   No murmur heard. Pulmonary/Chest: Effort normal and breath sounds normal. No respiratory distress. He has no wheezes. He has no rales.  Abdominal: Soft. He exhibits no distension. There is no tenderness. There is no rebound and no guarding.  Musculoskeletal: Normal range of motion. He exhibits no edema and no tenderness.  Neurological: He is alert and oriented to person, place, and time.  Skin: Skin is warm and dry. No rash noted. No erythema.  Psychiatric: He has a normal mood and affect. His behavior is normal.    ED Course  Procedures (including critical care time) Labs Review Labs Reviewed  CBC - Abnormal; Notable for the following:    RBC 2.62 (*)    Hemoglobin 8.5 (*)    HCT 24.6 (*)    RDW 24.5 (*)    All other components within normal limits  BASIC METABOLIC PANEL - Abnormal; Notable for the following:    Sodium 134 (*)    Chloride 94 (*)    Glucose, Bld 149 (*)    BUN 34 (*)    Creatinine, Ser 1.66 (*)    GFR calc non Af Amer 39 (*)    GFR calc Af Amer 46 (*)    All other components within normal limits  I-STAT TROPOININ, ED    Imaging Review Dg Chest Port 1 View  03/16/2014   CLINICAL DATA:  Atrial fibrillation with rapid ventricular response, began vomiting last night, shortness of breath, worse with exertion, intermittent dizziness, past history hyperlipidemia, hypertension, asthma, Goodpasture syndrome,  chronic diastolic CHF  EXAM: PORTABLE CHEST - 1 VIEW  COMPARISON:  Portable exam 1808 hr compared to 12/29/2013  FINDINGS: Enlargement of cardiac silhouette with pulmonary vascular congestion.  Mediastinal contours normal.  Chronic accentuation of interstitial markings in the mid to lower lungs bilaterally, similar to previous exam.  No segmental consolidation, pleural effusion or pneumothorax.  Bones diffusely demineralized.  Iodide positioning with slight rotation.  IMPRESSION: Enlargement of cardiac silhouette with pulmonary vascular congestion.  Persistent interstitial prominence in the mid to lower lungs could represent minimal recurrent edema or developing chronic interstitial lung disease/fibrosis.   Electronically Signed   By: Lavonia Dana M.D.   On: 03/16/2014 18:27     EKG Interpretation   Date/Time:  Friday March 16 2014 17:31:07 EDT Ventricular Rate:  141 PR Interval:    QRS Duration: 96 QT Interval:  350 QTC Calculation: 536  R Axis:   30 Text Interpretation:  Atrial flutter with 2:1 A-V conduction Marked ST  abnormality, possible inferior subendocardial injury No significant change  since last tracing Confirmed by [facility]  MD, Loree Fee ([id number]) on 03/16/2014  5:57:09 PM      MDM   Final diagnoses:  Atrial fibrillation with RVR    Patient with a history of Goodpasture's disease who recently finished plasmapheresis who has currently had a stable white count and hemoglobin presenting today with episodes of palpitations and dizziness. He was seen at PCP office and found to be in atrial fibrillation with RVR. Patient has a history of chronic A. fib and takes metoprolol for rate control which he did take today.  He denies any chest pain or shortness of breath. Palpitations and lightheadedness are worse with exertion. On exam he has no signs of fluid overload and has rapid irregular heart rate.  EKG with A. fib RVR. Troponin is negative. Heart rate 140 to 150s.  Patient started on  Cardizem and Cardizem drip. CBC with a stable hemoglobin of 8.5. BMP pending. Chest x-ray pending.  Dr. Irish Lack is pt's cardiologist  6:50 PM Cr stable at 1.66.  After cardizem bolus improvement in HR to 105 and cardizem gtt started.  Will admit for further care.  CRITICAL CARE Performed by: Blanchie Dessert Total critical care time: 30 Critical care time was exclusive of separately billable procedures and treating other patients. Critical care was necessary to treat or prevent imminent or life-threatening deterioration. Critical care was time spent personally by me on the following activities: development of treatment plan with patient and/or surrogate as well as nursing, discussions with consultants, evaluation of patient's response to treatment, examination of patient, obtaining history from patient or surrogate, ordering and performing treatments and interventions, ordering and review of laboratory studies, ordering and review of radiographic studies, pulse oximetry and re-evaluation of patient's condition.   Blanchie Dessert, MD 03/16/14 [place], MD 03/16/14 [place], MD 03/16/14 [id number]

## 2014-03-16 NOTE — H&P (Signed)
Patient ID: Bryan Hubbard MRN: [id number], DOB/AGE: Nov 15, 1940   Admit date: 03/16/2014   Primary Physician: [name], MD Primary Cardiologist: Dr. Irish Lack  Pt. Profile:  73 year old Caucasian male with past medical history of permanent atrial flutter started in 2011 and recently diagnosed goodpasture's disease presented to [facility] ED with a-flutter with RVR after having abdominal pain, nausea, and retching episode this morning  Problem List  Past Medical History  Diagnosis Date  . Atrial fibrillation   . Chronic diastolic heart failure   . DVT   . HYPERLIPIDEMIA   . HYPERTENSION, BENIGN   . ALLERGIC ASTHMA   . Palpitations   . H/O Goodpasture's syndrome     Past Surgical History  Procedure Laterality Date  . 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 His last stress test on 07/07/2010 which showed EF 52%, attenuation artifact in the inferior myocardium, otherwise no sign of ischemia. He underwent an echocardiogram on 12/22/2013 which showed EF 55%, mild to moderate mitral regurg, moderately dilated left and right atrium, PA pressure 45 mmHg. Patient was last seen by Dr. Irish Lack in the clinic on 03/05/2014 at which time he was doing well, his Lasix dose was temporarily increased however decreased back down to 20 mg daily. His ACEI was stopped due to goodpastures and renal insufficiency. His simvastatin was stopped due to leg pain. According to patient, he has been compliant with medication at home. Last INR check was this morning which was 2.5. He denies any recent fever, chill, increased lower extremity edema, orthopnea or paroxysmal  nocturnal dyspnea. There has been no decline in his functional status.  Patient states he has been feeling weak recently and contributing to this to his anemia. He did not feel good yesterday and eventually went to sleep around 9 PM last night. He woke up this morning around 5 AM with abdominal pain. Short after he had a bowel movement which was normal. His abdominal discomfort quickly resolved, however while he was trying to go back to sleep, he started having nausea and retching episodes. He denies any actual vomiting. Short after, he began to experiencing palpitation feeling which he only become aware when his HR is high. He denies any significant chest pain, but he does have some shortness of breath and dizziness. His home monitor shows a heart rate of 150. He presented to his PCPs office for EKG which shows atrial flutter with RVR. Dr. Irish Lack was notified and directed the patient to [facility] for further evaluation.  Upon arrival to [facility], his blood pressure was in the 110s range. His heart rate was in the 140. Initial EKG revealed atrial flutter with RVR. He was placed on IV diltiazem which titrated up to 15 mg despite continue to have heart rate in the 100-110s. Laboratory finding include Cr 1.66, hemoglobin 8.5 which has been stable in the past month. CXR showed pulmonary vascular congestion. Cardiology was consulted for further management.   Home Medications  Prior to Admission medications   Medication Sig Start Date End Date Taking? Authorizing Provider  bimatoprost (LUMIGAN) 0.01 % SOLN Place 1 drop into both eyes every morning.   Yes  Historical Provider, MD  cyclobenzaprine (FLEXERIL) 5 MG tablet Take 5 mg by mouth at bedtime.   Yes Historical Provider, MD  cyclophosphamide (CYTOXAN) 50 MG tablet Take 100 mg by mouth daily. Give on an empty stomach 1 hour before or 2 hours after meals.   Yes Historical Provider, MD  Dutasteride-Tamsulosin HCl 0.5-0.4 MG CAPS Take 1 capsule by  mouth daily.   Yes Historical Provider, MD  ferrous fumarate-iron polysaccharide complex (TANDEM) 162-115.2 MG CAPS Take 1 capsule by mouth daily with breakfast. One capsule/daily of Integra (120 mg iron and 40 mg Vit C)   Yes Historical Provider, MD  furosemide (LASIX) 20 MG tablet Take 1 tablet (20 mg total) by mouth daily. 12/30/13  Yes Belkys A Regalado, MD  metoprolol tartrate (LOPRESSOR) 25 MG tablet Take 25 mg by mouth 2 (two) times daily. 06/15/13  Yes Historical Provider, MD  montelukast (SINGULAIR) 10 MG tablet Take 10 mg by mouth at bedtime.  06/10/13  Yes Historical Provider, MD  omeprazole (PRILOSEC) 20 MG capsule Take 20 mg by mouth daily.  08/17/13  Yes Historical Provider, MD  predniSONE (DELTASONE) 10 MG tablet Take 30 mg by mouth daily with breakfast.   Yes Historical Provider, MD  traMADol (ULTRAM) 50 MG tablet Take 1 tablet (50 mg total) by mouth every 12 (twelve) hours as needed. 12/30/13  Yes Belkys A Regalado, MD  Vitamin D, Cholecalciferol, 1000 UNITS TABS Take 2 capsules by mouth daily.   Yes Historical Provider, MD  warfarin (COUMADIN) 4 MG tablet Take 4 mg by mouth daily.   Yes Historical Provider, MD  cyanocobalamin (,VITAMIN B-12,) 1000 MCG/ML injection Inject 1,000 mcg into the muscle every 30 (thirty) days.     Historical Provider, MD    Family History  Family History  Problem Relation Age of Onset  . Stroke Mother   . Heart attack Maternal Grandmother   . Coronary artery disease      negative history of early CAD    Social History  History   Social History  . Marital Status: Married    Spouse Name: N/A    Number of Children: N/A  . Years of Education: N/A   Occupational History  . Not on file.   Social History Main Topics  . Smoking status: Former Research scientist (life sciences)  . Smokeless tobacco: Not on file     Comment: quit smoking in 1967  . Alcohol Use: Yes     Comment: drink 1-2 glasses of wine with dinner every day  . Drug Use: No     Comment: Denies any illicit  drug use  . Sexual Activity: Not on file   Other Topics Concern  . Not on file   Social History Narrative  . No narrative on file     Review of Systems General:  No chills, fever, night sweats or weight changes.  Cardiovascular:  No chest pain, dyspnea on exertion, edema, orthopnea,  paroxysmal nocturnal dyspnea. +palpitation, DOE during palpitation Dermatological: No rash, lesions/masses Respiratory: No cough, dyspnea Urologic: No hematuria, dysuria Abdominal:   No vomiting, diarrhea, bright red blood per rectum, melena, or hematemesis. + nausea and retching episode Neurologic:  No visual changes, changes in mental status. +weakness for past several month All other systems reviewed and are otherwise negative except as noted above.  Physical Exam  Blood pressure 110/76, pulse 52, temperature 98.9 F (37.2 C), temperature source Oral, resp. rate 14, SpO2 98.00%.  General: Pleasant, NAD Psych: Normal affect. Neuro: Alert and oriented  X 3. Moves all extremities spontaneously. HEENT: Normal  Neck: Supple without bruits or JVD. Lungs:  Resp regular and unlabored, CTA. Heart: tachycardic, irregular. no s3, s4, or murmurs. Abdomen: Soft, non-tender, non-distended, BS + x 4.  Extremities: No clubbing, cyanosis or edema. DP/PT/Radials 2+ and equal bilaterally.  Labs  Troponin [facility] Test)  Recent Labs  03/16/14 1742  TROPIPOC 0.06   No results found for this basename: CKTOTAL, CKMB, TROPONINI,  in the last 72 hours Lab Results  Component Value Date   WBC 6.8 03/16/2014   HGB 8.5* 03/16/2014   HCT 24.6* 03/16/2014   MCV 93.9 03/16/2014   PLT 261 03/16/2014     Recent Labs Lab 03/16/14 1745  NA 134*  K 4.1  CL 94*  CO2 22  BUN 34*  CREATININE 1.66*  CALCIUM 9.5  GLUCOSE 149*   Lab Results  Component Value Date   CHOL  Value: 115        ATP III CLASSIFICATION:  <200     mg/dL   Desirable  200-239  mg/dL   Borderline High  >=240    mg/dL   High         06/07/2010   HDL 37* 06/07/2010   LDLCALC  Value: 56        Total Cholesterol/HDL:CHD Risk Coronary Heart Disease Risk Table                     Men   Women  1/2 Average Risk   3.4   3.3  Average Risk       5.0   4.4  2 X Average Risk   9.6   7.1  3 X Average Risk  23.4   11.0        Use the calculated Patient Ratio above and the CHD Risk Table to determine the patient's CHD Risk.        ATP III CLASSIFICATION (LDL):  <100     mg/dL   Optimal  100-129  mg/dL   Near or Above                    Optimal  130-159  mg/dL   Borderline  160-189  mg/dL   High  >190     mg/dL   Very High 06/07/2010   TRIG 108 06/07/2010   Lab Results  Component Value Date   DDIMER 7.83* 12/29/2013     Radiology/Studies  Dg Chest Port 1 View  03/16/2014   CLINICAL DATA:  Atrial fibrillation with rapid ventricular response, began vomiting last night, shortness of breath, worse with exertion, intermittent dizziness, past history hyperlipidemia, hypertension, asthma, Goodpasture syndrome, chronic diastolic CHF  EXAM: PORTABLE CHEST - 1 VIEW  COMPARISON:  Portable exam 1808 hr compared to 12/29/2013  FINDINGS: Enlargement of cardiac silhouette with pulmonary vascular congestion.  Mediastinal contours normal.  Chronic accentuation of interstitial markings in the mid to lower lungs bilaterally, similar to previous exam.  No segmental consolidation, pleural effusion or pneumothorax.  Bones diffusely demineralized.  Iodide positioning with slight rotation.  IMPRESSION: Enlargement of cardiac silhouette with pulmonary vascular congestion.  Persistent interstitial prominence in the mid to lower lungs could represent minimal recurrent edema or developing chronic interstitial lung disease/fibrosis.   Electronically Signed   By: Lavonia Dana M.D.   On: 03/16/2014 18:27    ECG  EKG shows 2-1 atrial flutter with RVR. Heart rate 140s  ASSESSMENT AND PLAN  1.  A-flutter with RVR  - possible exacerbated by retching, daily wine intake, and  anemia.    - previous well controlled for years on $Remove'25mg'tHKiGhI$  metoprolol  - admit to obs, continue IV diltiazem   2. Hypertension 3. Hyperlipidemia 4. history of permanent atrial flutter started in 2011 5. 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 chronic diastolic heart failure 7. Recently diagnosed with Goodpasture's disease status post plasmapheresis 8. 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   Hilbert Corrigan, [place] 03/16/2014, 7:39 PM   Attending note:  Patient seen and examined. Reviewed records and discussed the case with Bryan Hubbard. Bryan Hubbard has a long-standing history of atrial flutter that has been managed with a strategy of heart rate control and anticoagulation. Actually, he has done very well with this approach on fairly low-dose metoprolol and Coumadin. Recent history is complicated by renal insufficiency and Goodpasture's, followed closely by nephrology. He has been in his usual state of health with chronic shortness of breath, no regular sense of palpitations or chest pain. He reports compliance with his medications including today. Last night he experienced lower abdominal discomfort that was fairly intense, this was followed by nausea and dry heaves without frank emesis. He was able to go to the bathroom and have a bowel movement, symptoms subsequently improved. It was after this that he noted his heart rate was in the 150s. This was subsequently confirmed by ECG to be atrial flutter with 2:1 block. He was referred to the ER for further evaluation. At the present time he is on Cardizem infusion at 15 mg an hour with heart rates around 100 in atrial flutter. He feels better. Blood pressure is stable. Hemoglobin also stable 8.5, creatinine stable at 1.1, normal potassium, normal point-of-care troponin I. Pro BNP is increased at 573, however he had an echocardiogram done in March showing normal LVEF, mild to moderate  mitral regurgitation. Chest x-ray suggests some interstitial prominence in the mid lower lung zones. He has not had any cough, wheezing, fevers or chills. Plan is to admit him to the hospital, continue Cardizem infusion for rate control, try and convert back to his regular beta blocker regimen. May need an increase in dose. The real question is what driving his rapid atrial flutter which heretofore was well controlled. Plan to obtain followup TSH (was 0.7 in March), LFTs, amylase and lipase.  Satira Sark, M.D., F.A.C.C.

## 2014-03-16 NOTE — Telephone Encounter (Signed)
Spoke to Dr. Dorthy Cooler and reviewed the ECG>  Patient in atrial flutter 2:1.  He will be going to the ER.

## 2014-03-16 NOTE — ED Notes (Signed)
Pt in from MD office for Afib RVR, states he stays in afib but normally at a controlled rate, states last night he was vomiting and that was why he went to his PMD today ,denies pain, c/o shortness of breath, worse with exertion, also intermittent dizziness

## 2014-03-16 NOTE — Telephone Encounter (Signed)
New message     Pt had kidney failure recently.  Now he has anemia which has made his pulse rate high--in the 150s.  Pt said his kidney doctor suggest Dr Irish Lack run some test.  Talk to a nurse

## 2014-03-17 DIAGNOSIS — I4891 Unspecified atrial fibrillation: Principal | ICD-10-CM

## 2014-03-17 DIAGNOSIS — D638 Anemia in other chronic diseases classified elsewhere: Secondary | ICD-10-CM

## 2014-03-17 LAB — CBC WITH DIFFERENTIAL/PLATELET
BASOS ABS: 0 10*3/uL (ref 0.0–0.1)
Basophils Relative: 0 % (ref 0–1)
EOS ABS: 0.2 10*3/uL (ref 0.0–0.7)
Eosinophils Relative: 3 % (ref 0–5)
HCT: 18.9 % — ABNORMAL LOW (ref 39.0–52.0)
Hemoglobin: 6.4 g/dL — CL (ref 13.0–17.0)
LYMPHS ABS: 0.2 10*3/uL — AB (ref 0.7–4.0)
Lymphocytes Relative: 3 % — ABNORMAL LOW (ref 12–46)
MCH: 31.8 pg (ref 26.0–34.0)
MCHC: 33.9 g/dL (ref 30.0–36.0)
MCV: 94 fL (ref 78.0–100.0)
MONO ABS: 0.5 10*3/uL (ref 0.1–1.0)
Monocytes Relative: 9 % (ref 3–12)
Neutro Abs: 4.8 10*3/uL (ref 1.7–7.7)
Neutrophils Relative %: 85 % — ABNORMAL HIGH (ref 43–77)
PLATELETS: 260 10*3/uL (ref 150–400)
RBC: 2.01 MIL/uL — ABNORMAL LOW (ref 4.22–5.81)
RDW: 24.5 % — AB (ref 11.5–15.5)
WBC: 5.7 10*3/uL (ref 4.0–10.5)

## 2014-03-17 LAB — TROPONIN I
Troponin I: <0.3 ng/mL (ref <0.30)
Troponin I: <0.3 ng/mL (ref <0.30)

## 2014-03-17 LAB — LIPID PANEL
CHOL/HDL RATIO: 4.5 ratio
Cholesterol: 224 mg/dL — ABNORMAL HIGH (ref 0–200)
HDL: 50 mg/dL (ref >39)
LDL Cholesterol: 150 mg/dL — ABNORMAL HIGH (ref 0–99)
Triglycerides: 122 mg/dL (ref <150)
VLDL: 24 mg/dL (ref 0–40)

## 2014-03-17 LAB — HEPATIC FUNCTION PANEL
ALT: 44 U/L (ref 0–53)
AST: 24 U/L (ref 0–37)
Albumin: 2.7 g/dL — ABNORMAL LOW (ref 3.5–5.2)
Alkaline Phosphatase: 47 U/L (ref 39–117)
BILIRUBIN TOTAL: 0.4 mg/dL (ref 0.3–1.2)
Bilirubin, Direct: <0.2 mg/dL (ref 0.0–0.3)
Total Protein: 5.9 g/dL — ABNORMAL LOW (ref 6.0–8.3)

## 2014-03-17 LAB — BASIC METABOLIC PANEL
BUN: 34 mg/dL — ABNORMAL HIGH (ref 6–23)
CALCIUM: 9.3 mg/dL (ref 8.4–10.5)
CO2: 24 meq/L (ref 19–32)
Chloride: 97 mEq/L (ref 96–112)
Creatinine, Ser: 1.47 mg/dL — ABNORMAL HIGH (ref 0.50–1.35)
GFR calc Af Amer: 53 mL/min — ABNORMAL LOW (ref >90)
GFR calc non Af Amer: 46 mL/min — ABNORMAL LOW (ref >90)
GLUCOSE: 100 mg/dL — AB (ref 70–99)
Potassium: 4.2 mEq/L (ref 3.7–5.3)
Sodium: 137 mEq/L (ref 137–147)

## 2014-03-17 LAB — LIPASE, BLOOD: Lipase: 19 U/L (ref 11–59)

## 2014-03-17 LAB — CBC
HCT: 21 % — ABNORMAL LOW (ref 39.0–52.0)
Hemoglobin: 7.2 g/dL — ABNORMAL LOW (ref 13.0–17.0)
MCH: 32.1 pg (ref 26.0–34.0)
MCHC: 34.3 g/dL (ref 30.0–36.0)
MCV: 93.8 fL (ref 78.0–100.0)
PLATELETS: 210 10*3/uL (ref 150–400)
RBC: 2.24 MIL/uL — ABNORMAL LOW (ref 4.22–5.81)
RDW: 24.4 % — AB (ref 11.5–15.5)
WBC: 5.8 10*3/uL (ref 4.0–10.5)

## 2014-03-17 LAB — AMYLASE: Amylase: 35 U/L (ref 0–105)

## 2014-03-17 LAB — PROTIME-INR
INR: 1.8 — ABNORMAL HIGH (ref 0.00–1.49)
PROTHROMBIN TIME: 20.4 s — AB (ref 11.6–15.2)

## 2014-03-17 LAB — TSH: TSH: 0.56 u[IU]/mL (ref 0.350–4.500)

## 2014-03-17 MED ORDER — DARBEPOETIN ALFA-POLYSORBATE 40 MCG/0.4ML IJ SOLN
40.0000 ug | Freq: Once | INTRAMUSCULAR | Status: AC
  Administered 2014-03-17: 40 ug via SUBCUTANEOUS
  Filled 2014-03-17 (×2): qty 0.4

## 2014-03-17 MED ORDER — METOPROLOL TARTRATE 50 MG PO TABS
50.0000 mg | ORAL_TABLET | Freq: Two times a day (BID) | ORAL | Status: DC
  Administered 2014-03-17 – 2014-03-18 (×3): 50 mg via ORAL
  Filled 2014-03-17 (×4): qty 1

## 2014-03-17 MED ORDER — BOOST / RESOURCE BREEZE PO LIQD
1.0000 | Freq: Two times a day (BID) | ORAL | Status: DC
  Administered 2014-03-17: 1 via ORAL

## 2014-03-17 NOTE — Progress Notes (Signed)
INITIAL NUTRITION ASSESSMENT  DOCUMENTATION CODES Per approved criteria  -Not Applicable   INTERVENTION: Resource Breeze po BID, each supplement provides 250 kcal and 9 grams of protein  NUTRITION DIAGNOSIS: Inadequate oral intake related to decreased appetite, recent acute illness as evidenced by Meal Completion: 50%.   Goal: Pt to meet >/= 90% of their estimated nutrition needs   Monitor:  PO intake, supplement acceptance, weight trend, labs  Reason for Assessment: Pt identified as at nutrition risk on the Malnutrition Screen Tool  73 y.o. male  Admitting Dx: <principal problem not specified>  ASSESSMENT: Pt with hx of recently dx goodpasture's disease s/p plasmapheresis, admitted with a-flutter with RVR.  Daily wine consumption noted.  Per pt and wife pt lost weight when originally dx with goodpasture's, but pt has since maintained his current weight.  Per pt his appetite has not yet returned. Pt has had ensure but has drank these only once or twice. Wife concerned about appetite and prior weight loss.  Nutrition-focused physical exam WNL.   Height: Ht Readings from Last 1 Encounters:  03/16/14 5' 10.5" (1.791 m)    Weight: Wt Readings from Last 1 Encounters:  03/16/14 180 lb 9.6 oz (81.92 kg)    Ideal Body Weight: 76.8 kg   % Ideal Body Weight: 107%  Wt Readings from Last 10 Encounters:  03/16/14 180 lb 9.6 oz (81.92 kg)  03/05/14 182 lb 1.9 oz (82.609 kg)  01/24/14 184 lb 15.5 oz (83.9 kg)  01/23/14 176 lb (79.833 kg)  01/20/14 177 lb 14.6 oz (80.7 kg)  01/19/14 181 lb 7 oz (82.3 kg)  01/17/14 182 lb (82.555 kg)  01/15/14 182 lb 15.7 oz (83 kg)  01/12/14 182 lb 3.2 oz (82.645 kg)  01/09/14 184 lb (83.462 kg)    Usual Body Weight: 175-180 lb   % Usual Body Weight: 100%  BMI:  Body mass index is 25.54 kg/(m^2).  Estimated Nutritional Needs: Kcal: 1900-2100 Protein: 90-100 grams Fluid: > 1.2 L/day  Skin: intact  Diet Order: Renal  EDUCATION  NEEDS: -No education needs identified at this time   Intake/Output Summary (Last 24 hours) at 03/17/14 0905 Last data filed at 03/17/14 0800  Gross per 24 hour  Intake      0 ml  Output   1600 ml  Net  -1600 ml    Last BM: 6/19   Labs:   Recent Labs Lab 03/16/14 1745 03/17/14 0259  NA 134* 137  K 4.1 4.2  CL 94* 97  CO2 22 24  BUN 34* 34*  CREATININE 1.66* 1.47*  CALCIUM 9.5 9.3  GLUCOSE 149* 100*    CBG (last 3)  No results found for this basename: GLUCAP,  in the last 72 hours  Scheduled Meds: . cholecalciferol  2,000 Units Oral Daily  . [START ON 04/01/2014] cyanocobalamin  1,000 mcg Intramuscular Q30 days  . cyclobenzaprine  5 mg Oral QHS  . cyclophosphamide  100 mg Oral Daily  . darbepoetin  40 mcg Subcutaneous Once  . dutasteride  0.5 mg Oral Daily   And  . tamsulosin  0.4 mg Oral Daily  . ferrous fumarate  1 tablet Oral Q breakfast  . filgrastim (NEUPOGEN)  SQ  450 mcg Subcutaneous Daily  . furosemide  20 mg Oral Daily  . latanoprost  1 drop Both Eyes BH-q7a  . metoprolol tartrate  50 mg Oral BID  . montelukast  10 mg Oral QHS  . pantoprazole  40 mg Oral Daily  .  predniSONE  30 mg Oral Q breakfast  . warfarin  4 mg Oral q1800  . Warfarin - Physician Dosing Inpatient   Does not apply q1800    Continuous Infusions:   Past Medical History  Diagnosis Date  . Atrial flutter started in 2011  . Chronic diastolic heart failure   . DVT   . HYPERLIPIDEMIA   . HYPERTENSION, BENIGN   . ALLERGIC ASTHMA   . Palpitations   . H/O Goodpasture's syndrome     "autoimmune disease; affects kidneys and lungs"  . DVT (deep venous thrombosis) 09/2001    "right groin"  . PE (pulmonary embolism) 09/2001  . CHF (congestive heart failure)   . Anemia   . History of blood transfusion     "related to Goodpasture's syndrome; plasmapheresis process"  . Stomach ulcer   . Arthritis     "hands, knees" (03/16/2014)  . Gout   . Renal insufficiency     "related to the  Goodpasture's"  . Atrial flutter with rapid ventricular response 03/16/2014    Past Surgical History  Procedure Laterality Date  . Renal biopsy, percutaneous  12/2013  . Vena cava filter placement  09/2001    [place], [place], [place] Pager [phone] After Hours Pager

## 2014-03-17 NOTE — Progress Notes (Signed)
Pt's hmg 7.2 . Md on call made aware. No new order received. Will cont to monitor pt.

## 2014-03-17 NOTE — Progress Notes (Signed)
Pt's bp 90/60. Pt asymptomatic. Pa on call made aware. New order received to continue to Cardizem at 5 ml/hr. Will cont to monitor pt.

## 2014-03-17 NOTE — Consult Note (Signed)
Renal Service Consult Note [facility]  Bryan Hubbard 03/17/2014 Sol Blazing Requesting Physician:  Dr Irish Lack  Reason for Consult:  CKD patient with aflutter and anemia, ? EPO HPI: The patient is a 73 y.o. year-old who presented in March this year with pulm hemorrhage and moderate renal failure. 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  He was treated aggressively with IV steroid boluses, oral Cytoxan and plasmapheresis. The pulm disease responded quickly to steroids. He was dc'd and finished his PLEX course as an outpatient in late April.  In early June he was found to be neutropenic and rec'd about 3 days of Neupogen in short stay with a good response, and the Cytoxan was reduced from 150/d to 100/d.  He presented on 6/19 with palpitations and was found to be in atrial flutter at 2:1 block, he was admitted.  He has hx of aflutter dating back to 2011 and is on coumadin. Treated w IV dilitiazem and po metoprolol and now is back in NSR. Hb was 8 then 6.4, then 7.2 and renal svc is asked to see for possible ESA Rx.    Patient is stable, no compliants. Taking lasix 20/d with mild ankle edema, hx of diast HF in 2011.  WBC is 5-6 k range. No CP, sob, hemoptysis, urinary changes. Creat is 1.5.   Past Medical History  Past Medical History  Diagnosis Date  . Atrial flutter started in 2011  . Chronic diastolic heart failure   . DVT   . HYPERLIPIDEMIA   . HYPERTENSION, BENIGN   . ALLERGIC ASTHMA   . Palpitations   . H/O Goodpasture's syndrome     "autoimmune disease; affects kidneys and lungs"  . DVT (deep venous thrombosis) 09/2001    "right groin"  . PE (pulmonary embolism) 09/2001  . CHF (congestive heart failure)   . Anemia   . History of blood transfusion     "related to Goodpasture's syndrome; plasmapheresis process"  . Stomach ulcer   . Arthritis     "hands, knees" (03/16/2014)  . Gout   . Renal  insufficiency     "related to the Goodpasture's"  . Atrial flutter with rapid ventricular response 03/16/2014   Past Surgical History  Past Surgical History  Procedure Laterality Date  . Renal biopsy, percutaneous  12/2013  . Vena cava filter placement  09/2001   Family History  Family History  Problem Relation Age of Onset  . Stroke Mother   . Heart attack Maternal Grandmother   . Coronary artery disease      negative history of early CAD   Social History  reports that he quit smoking about 46 years ago. His smoking use included Cigarettes. He has a 6 pack-year smoking history. He has never used smokeless tobacco. He reports that he drinks about 8.4 ounces of alcohol per week. He reports that he does not use illicit drugs. Allergies No Known Allergies Home medications Prior to Admission medications   Medication Sig Start Date End Date Taking? Authorizing Anelly Samarin  bimatoprost (LUMIGAN) 0.01 % SOLN Place 1 drop into both eyes every morning.   Yes Historical Nare Gaspari, MD  cyclobenzaprine (FLEXERIL) 5 MG tablet Take 5 mg by mouth at bedtime.   Yes Historical Oluwadamilola Deliz, MD  cyclophosphamide (CYTOXAN) 50 MG tablet Take 100 mg by mouth daily. Give on an empty stomach 1 hour before or 2 hours after meals.   Yes Historical Shadana Pry, MD  Dutasteride-Tamsulosin HCl 0.5-0.4 MG CAPS Take 1 capsule by mouth daily.   Yes Historical Grantley Savage, MD  ferrous fumarate-iron polysaccharide complex (TANDEM) 162-115.2 MG CAPS Take 1 capsule by mouth daily with breakfast. One capsule/daily of Integra (120 mg iron and 40 mg Vit C)   Yes Historical Tuck Dulworth, MD  furosemide (LASIX) 20 MG tablet Take 1 tablet (20 mg total) by mouth daily. 12/30/13  Yes Belkys A Regalado, MD  metoprolol tartrate (LOPRESSOR) 25 MG tablet Take 25 mg by mouth 2 (two) times daily. 06/15/13  Yes Historical Maelie Chriswell, MD  montelukast (SINGULAIR) 10 MG tablet Take 10 mg by mouth at bedtime.  06/10/13  Yes Historical Seraphim Trow, MD  omeprazole  (PRILOSEC) 20 MG capsule Take 20 mg by mouth daily.  08/17/13  Yes Historical Shaquela Weichert, MD  predniSONE (DELTASONE) 10 MG tablet Take 30 mg by mouth daily with breakfast.   Yes Historical Khylan Sawyer, MD  traMADol (ULTRAM) 50 MG tablet Take 1 tablet (50 mg total) by mouth every 12 (twelve) hours as needed. 12/30/13  Yes Belkys A Regalado, MD  Vitamin D, Cholecalciferol, 1000 UNITS TABS Take 2 capsules by mouth daily.   Yes Historical Louana Fontenot, MD  warfarin (COUMADIN) 4 MG tablet Take 4 mg by mouth daily.   Yes Historical Ashaunte Standley, MD  cyanocobalamin (,VITAMIN B-12,) 1000 MCG/ML injection Inject 1,000 mcg into the muscle every 30 (thirty) days.     Historical Arin Peral, MD   Liver Function Tests  Recent Labs Lab 03/17/14 0259  AST 24  ALT 44  ALKPHOS 47  BILITOT 0.4  PROT 5.9*  ALBUMIN 2.7*    Recent Labs Lab 03/17/14 0259  LIPASE 19  AMYLASE 35   CBC  Recent Labs Lab 03/16/14 1745 03/17/14 0259 03/17/14 0425  WBC 6.8 5.7 5.8  NEUTROABS  --  4.8  --   HGB 8.5* 6.4* 7.2*  HCT 24.6* 18.9* 21.0*  MCV 93.9 94.0 93.8  PLT 261 260 885   Basic Metabolic Panel  Recent Labs Lab 03/16/14 1745 03/17/14 0259  NA 134* 137  K 4.1 4.2  CL 94* 97  CO2 22 24  GLUCOSE 149* 100*  BUN 34* 34*  CREATININE 1.66* 1.47*  CALCIUM 9.5 9.3    Filed Vitals:   03/16/14 2120 03/17/14 0500 03/17/14 0700 03/17/14 1044  BP: 117/67 95/56 90/60  118/62  Pulse: 113 65 65 69  Temp: 97.4 F (36.3 C) 97.9 F (36.6 C)    TempSrc: Oral     Resp: 18 16    Height: 5' 10.5" (1.791 m)     Weight: 81.92 kg (180 lb 9.6 oz)     SpO2: 98% 98%     Exam Alert, no distress No rash, cyanosis or gangrene Sclera anicteric, throat clear No JVD Chest clear bilat RRR no RG Abd soft, NTND 1+ ankle edema bilat Neuro is nf, Ox 3  Cr 1.47 Na 137, K 4.2 Hb 7.2,  WBC 5.8  Plt 210  INR 1.80   Assessment/Plan: 1 Anemia of chronic disease / CKD- agree with aranesp, we will set him up for this to be  continued in the OP setting as well.  Patient is in aggrement. Will sign off. 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 Is on steroid taper now and lowered dose of Cytoxan (hx of neutropenia earlier this month) with no signs of recurrent disease. Creat stable 1.4 - 1.7 range.  Kelly Splinter MD (pgr) [phone]    [phone] 03/17/2014,  12:15 PM

## 2014-03-17 NOTE — Progress Notes (Signed)
Utilization review complete 

## 2014-03-17 NOTE — Progress Notes (Signed)
Pt's hmg dropped to 6.4. Md on call made aware. New order received. Will cont to monitor pt.

## 2014-03-17 NOTE — Progress Notes (Signed)
Patient ID: Bryan Hubbard, male   DOB: [birth year], 73 y.o.   MRN: [id number]    Subjective:  Denies SSCP, palpitations or Dyspnea   Objective:  Filed Vitals:   03/16/14 2045 03/16/14 2120 03/17/14 0500 03/17/14 0700  BP: 126/68 117/67 95/56 90/60   Pulse:  113 65 65  Temp:  97.4 F (36.3 C) 97.9 F (36.6 C)   TempSrc:  Oral    Resp:  18 16   Height:  5' 10.5" (1.791 m)    Weight:  180 lb 9.6 oz (81.92 kg)    SpO2: 100% 98% 98%     Intake/Output from previous day:  Intake/Output Summary (Last 24 hours) at 03/17/14 0813 Last data filed at 03/17/14 0700  Gross per 24 hour  Intake      0 ml  Output   1400 ml  Net  -1400 ml    Physical Exam: Affect appropriate Pale male  HEENT: normal Neck supple with no adenopathy JVP normal no bruits no thyromegaly Lungs clear with no wheezing and good diaphragmatic motion Heart:  S1/S2 no murmur, no rub, gallop or click PMI normal Abdomen: benighn, BS positve, no tenderness, no AAA no bruit.  No HSM or HJR Distal pulses intact with no bruits No edema Neuro non-focal Skin warm and dry No muscular weakness   Lab Results: Basic Metabolic Panel:  Recent Labs  03/16/14 1745 03/17/14 0259  NA 134* 137  K 4.1 4.2  CL 94* 97  CO2 22 24  GLUCOSE 149* 100*  BUN 34* 34*  CREATININE 1.66* 1.47*  CALCIUM 9.5 9.3   Liver Function Tests:  Recent Labs  03/17/14 0259  AST 24  ALT 44  ALKPHOS 47  BILITOT 0.4  PROT 5.9*  ALBUMIN 2.7*    Recent Labs  03/17/14 0259  LIPASE 19  AMYLASE 35   CBC:  Recent Labs  03/17/14 0259 03/17/14 0425  WBC 5.7 5.8  NEUTROABS 4.8  --   HGB 6.4* 7.2*  HCT 18.9* 21.0*  MCV 94.0 93.8  PLT 260 210   Cardiac Enzymes:  Recent Labs  03/16/14 2229 03/17/14 0259  TROPONINI <0.30 <0.30   Fasting Lipid Panel:  Recent Labs  03/17/14 0259  CHOL 224*  HDL 50  LDLCALC 150*  TRIG 122  CHOLHDL 4.5   Thyroid Function Tests:  Recent Labs  03/17/14 0259  TSH 0.560     Imaging: Dg Chest Port 1 View  03/16/2014   CLINICAL DATA:  Atrial fibrillation with rapid ventricular response, began vomiting last night, shortness of breath, worse with exertion, intermittent dizziness, past history hyperlipidemia, hypertension, asthma, Goodpasture syndrome, chronic diastolic CHF  EXAM: PORTABLE CHEST - 1 VIEW  COMPARISON:  Portable exam 1808 hr compared to 12/29/2013  FINDINGS: Enlargement of cardiac silhouette with pulmonary vascular congestion.  Mediastinal contours normal.  Chronic accentuation of interstitial markings in the mid to lower lungs bilaterally, similar to previous exam.  No segmental consolidation, pleural effusion or pneumothorax.  Bones diffusely demineralized.  Iodide positioning with slight rotation.  IMPRESSION: Enlargement of cardiac silhouette with pulmonary vascular congestion.  Persistent interstitial prominence in the mid to lower lungs could represent minimal recurrent edema or developing chronic interstitial lung disease/fibrosis.   Electronically Signed   By: Lavonia Dana M.D.   On: 03/16/2014 18:27    Cardiac Studies:  ECG:  Flutter no acut ST changes   Telemetry:  Flutter controlled rate 50's   Echo:   Medications:   . cholecalciferol  2,000 Units Oral Daily  . [START ON 04/01/2014] cyanocobalamin  1,000 mcg Intramuscular Q30 days  . cyclobenzaprine  5 mg Oral QHS  . cyclophosphamide  100 mg Oral Daily  . dutasteride  0.5 mg Oral Daily   And  . tamsulosin  0.4 mg Oral Daily  . ferrous fumarate  1 tablet Oral Q breakfast  . filgrastim (NEUPOGEN)  SQ  450 mcg Subcutaneous Daily  . furosemide  20 mg Oral Daily  . latanoprost  1 drop Both Eyes BH-q7a  . metoprolol tartrate  25 mg Oral BID  . montelukast  10 mg Oral QHS  . pantoprazole  40 mg Oral Daily  . predniSONE  30 mg Oral Q breakfast  . warfarin  4 mg Oral q1800  . Warfarin - Physician Dosing Inpatient   Does not apply q1800     . diltiazem (CARDIZEM) infusion 15 mg/hr (03/17/14  0657)    Assessment/Plan:  Flutter:  D/C cardizem drip resume and increase beta blocker no anticoagulation with profound anemia Anemia:  Has had plasmaphoresis and blood transfusions  Presumed form Goodpastures  This is driving aflutter Should get Procrit injection will call renal  Jenkins Rouge 03/17/2014, 8:13 AM

## 2014-03-18 ENCOUNTER — Encounter (HOSPITAL_COMMUNITY): Payer: Self-pay | Admitting: Cardiology

## 2014-03-18 DIAGNOSIS — R109 Unspecified abdominal pain: Secondary | ICD-10-CM | POA: Diagnosis present

## 2014-03-18 DIAGNOSIS — I4892 Unspecified atrial flutter: Secondary | ICD-10-CM | POA: Diagnosis present

## 2014-03-18 DIAGNOSIS — M310 Hypersensitivity angiitis: Secondary | ICD-10-CM | POA: Diagnosis present

## 2014-03-18 DIAGNOSIS — I5033 Acute on chronic diastolic (congestive) heart failure: Secondary | ICD-10-CM

## 2014-03-18 HISTORY — DX: Acute on chronic diastolic (congestive) heart failure: I50.33

## 2014-03-18 LAB — CBC WITH DIFFERENTIAL/PLATELET
Basophils Absolute: 0 10*3/uL (ref 0.0–0.1)
Basophils Relative: 0 % (ref 0–1)
Eosinophils Absolute: 0.1 10*3/uL (ref 0.0–0.7)
Eosinophils Relative: 2 % (ref 0–5)
HCT: 20.7 % — ABNORMAL LOW (ref 39.0–52.0)
Hemoglobin: 7 g/dL — ABNORMAL LOW (ref 13.0–17.0)
LYMPHS PCT: 5 % — AB (ref 12–46)
Lymphs Abs: 0.3 10*3/uL — ABNORMAL LOW (ref 0.7–4.0)
MCH: 32.3 pg (ref 26.0–34.0)
MCHC: 33.8 g/dL (ref 30.0–36.0)
MCV: 95.4 fL (ref 78.0–100.0)
MONO ABS: 0.4 10*3/uL (ref 0.1–1.0)
Monocytes Relative: 6 % (ref 3–12)
Neutro Abs: 5.2 10*3/uL (ref 1.7–7.7)
Neutrophils Relative %: 87 % — ABNORMAL HIGH (ref 43–77)
PLATELETS: 239 10*3/uL (ref 150–400)
RBC: 2.17 MIL/uL — AB (ref 4.22–5.81)
WBC: 6 10*3/uL (ref 4.0–10.5)

## 2014-03-18 LAB — PROTIME-INR
INR: 2.42 — ABNORMAL HIGH (ref 0.00–1.49)
PROTHROMBIN TIME: 25.5 s — AB (ref 11.6–15.2)

## 2014-03-18 MED ORDER — METOPROLOL TARTRATE 50 MG PO TABS: 75.0000 mg | ORAL_TABLET | Freq: Two times a day (BID) | ORAL | Status: DC

## 2014-03-18 MED ORDER — METOPROLOL TARTRATE 50 MG PO TABS: 50.0000 mg | ORAL_TABLET | Freq: Two times a day (BID) | ORAL | Status: DC

## 2014-03-18 NOTE — Discharge Instructions (Signed)
Call if chest pain or shortness of breath.  If heart rate increases again, call Dr. Irish Lack as before.  Heart Healthy low sodium diet.  We increased your lopressor to 75 mg twice a day--take 3 of your 25 mg tabs twice a day until these come in from your mail away pharmacy.   Have your blood count and coumadin checked at your primary care MD's office this week.

## 2014-03-18 NOTE — Discharge Summary (Signed)
Physician Discharge Summary       Patient ID: Bryan Hubbard MRN: [id number] DOB/AGE: [birth year] 73 y.o.  Admit date: 03/16/2014 Discharge date: 03/18/2014  Discharge Diagnoses:  Principal Problem:   Atrial flutter with RVR, rate controlled at discharge Active Problems:   Anemia of chronic disease   Acute on chronic diastolic HF (heart failure) in combination with rapid a flutter and anemia    Goodpasture's disease, treated with steroid, oral cytoxan and plasmapheresis     HYPERTENSION, BENIGN   Chronic diastolic heart failure   Chronic atrial flutter   Abdominal pain, resolved, negative amylase and lipase   Discharged Condition: good  Procedures: none  Hospital Course: 73 year old Caucasian male with PMH significant for hypertension, hyperlipidemia, history of permanent atrial flutter started in 2011, 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 His last stress test on 07/07/2010 which showed EF 52%, attenuation artifact in the inferior myocardium, otherwise no sign of ischemia. He underwent an echocardiogram on 12/22/2013 which showed EF 55%, mild to moderate mitral regurg, moderately dilated left and right atrium, PA pressure 45 mmHg. Patient was last seen by Dr. Irish Lack in the clinic on 03/05/2014 at which time he was doing well, his Lasix dose was temporarily increased however decreased back down to 20 mg daily. His ACEI was stopped due to goodpastures and renal insufficiency. His simvastatin was stopped due to leg pain. According to patient, he has been compliant with medication at home. Last INR check was the morning of admit which was 2.5. He denies any recent fever, chills, increased lower extremity edema, orthopnea or paroxysmal nocturnal dyspnea. There has been no decline in his functional status.   Patient stated he has been  feeling weak recently and contributing to this to his anemia. He did not feel good the day before admit and eventually went to sleep around 9 PM that night. He woke up 03/16/14 around 5 AM with abdominal pain. Short after he had a bowel movement which was normal. His abdominal discomfort quickly resolved, however while he was trying to go back to sleep, he started having nausea and retching episodes. He denies any actual vomiting. Short after, he began to experiencing palpitation feeling which he only become aware when his HR is high. He denied any significant chest pain, but he did have some shortness of breath and dizziness. His home monitor shows a heart rate of 150. He presented to his PCPs office for EKG which shows atrial flutter with RVR. Dr. Irish Lack was notified and directed the patient to [facility] for further evaluation.  On arrival his H/H was stable and troponin levels negative.  His Hgb did drop to 7.0 and renal was consulted and aranesp was given.  Dr. Jonnie Finner will arrange aranesp as outpt.  He will need close follow up of his H/H which they will check at anemia clinic.  His INR is followed through his PCP's office.  He will need that checked this week along with cbc.    Heart rate controlled initially with cardizem IV and now with increased dose of BB.  Pt also is -7616 with I & O, now that his heart rate is controlled, mild acute on chronic diastolic HF.  Pt seen and examined by Dr. Johnsie Cancel 03/18/14 and if ambulates without problems will plan for discharge with follow up with PCP, Renal and Dr. Irish Lack.  Will continue coumadin for now though may need to be addressed if H/H become difficult to manage. Wt down 2 pounds since admit.  With walking prior to discharge his HR up to 140, sp02 was 85-93%, discussed with Dr. Martinique and we increased his metoprolol to 75 mg BID. He will need labs checked this week with PCP.  Consults: nephrology  Significant Diagnostic Studies:  CBC      Component Value Date/Time   WBC 6.0 03/18/2014 0245   WBC 10.4* 12/29/2011 1003   RBC 2.17* 03/18/2014 0245   RBC 2.70* 01/20/2014 0913   RBC 4.05* 12/29/2011 1003   HGB 7.0* 03/18/2014 0245   HGB 12.9* 12/29/2011 1003   HCT 20.7* 03/18/2014 0245   HCT 38.5 12/29/2011 1003   PLT 239 03/18/2014 0245   PLT 272 12/29/2011 1003   MCV 95.4 03/18/2014 0245   MCV 94.9 12/29/2011 1003   MCH 32.3 03/18/2014 0245   MCH 31.8 12/29/2011 1003   MCHC 33.8 03/18/2014 0245   MCHC 33.5 12/29/2011 1003   RDW NOT CALCULATED 03/18/2014 0245   RDW 14.2 12/29/2011 1003   LYMPHSABS 0.3* 03/18/2014 0245   LYMPHSABS 1.5 12/29/2011 1003   MONOABS 0.4 03/18/2014 0245   MONOABS 1.4* 12/29/2011 1003   EOSABS 0.1 03/18/2014 0245   EOSABS 0.3 12/29/2011 1003   BASOSABS 0.0 03/18/2014 0245   BASOSABS 0.0 12/29/2011 1003    BMET    Component Value Date/Time   NA 137 03/17/2014 0259   K 4.2 03/17/2014 0259   CL 97 03/17/2014 0259   CO2 24 03/17/2014 0259   GLUCOSE 100* 03/17/2014 0259   BUN 34* 03/17/2014 0259   CREATININE 1.47* 03/17/2014 0259   CALCIUM 9.3 03/17/2014 0259   GFRNONAA 46* 03/17/2014 0259   GFRAA 53* 03/17/2014 0259   Amylase 35, lipase 19, albumin 2.7, total Protein 5.9,  Troponin <0.30 X 3  T. Chol 224, TG 122, HDL 50, LDL 150,     INR 2.42- was not therapeutic on admit.  TSH 0.560  EXAM: PORTABLE CHEST - 1 VIEW COMPARISON: Portable exam 1808 hr compared to 12/29/2013 FINDINGS: Enlargement of cardiac silhouette with pulmonary vascularcongestion. Mediastinal contours normal. Chronic accentuation of interstitial markings in the mid to lower lungs bilaterally, similar to previous exam. No segmental consolidation, pleural effusion or pneumothorax. Bones diffusely demineralized. Iodide positioning with slight rotation. IMPRESSION: Enlargement of cardiac silhouette with pulmonary vascular congestion. Persistent interstitial prominence in the mid to lower lungs could represent minimal recurrent edema or developing  chronic interstitial lung disease/fibrosis.     Discharge Exam: Blood pressure 155/71, pulse 114, temperature 98 F (36.7 C), temperature source Oral, resp. rate 18, height 5' 10.5" (1.791 m), weight 178 lb 8 oz (80.967 kg), SpO2 96.00%.    Disposition: 01-Home or Self Care     Medication List         bimatoprost 0.01 % Soln  Commonly known as:  LUMIGAN  Place 1 drop into both eyes every morning.     cyanocobalamin 1000 MCG/ML injection  Commonly known as:  (VITAMIN B-12)  Inject 1,000 mcg into the muscle every 30 (thirty) days.     cyclobenzaprine 5 MG tablet  Commonly known as:  FLEXERIL  Take 5 mg by mouth at bedtime.     cyclophosphamide 50 MG tablet  Commonly known as:  CYTOXAN  Take 100 mg by mouth daily. Give on an empty stomach 1 hour before or 2 hours after meals.     Dutasteride-Tamsulosin  HCl 0.5-0.4 MG Caps  Take 1 capsule by mouth daily.     ferrous fumarate-iron polysaccharide complex 162-115.2 MG Caps  Commonly known as:  TANDEM  Take 1 capsule by mouth daily with breakfast. One capsule/daily of Integra (120 mg iron and 40 mg Vit C)     furosemide 20 MG tablet  Commonly known as:  LASIX  Take 1 tablet (20 mg total) by mouth daily.     metoprolol 50 MG tablet  Commonly known as:  LOPRESSOR  Take 1 tablet (50 mg total) by mouth 2 (two) times daily.     montelukast 10 MG tablet  Commonly known as:  SINGULAIR  Take 10 mg by mouth at bedtime.     omeprazole 20 MG capsule  Commonly known as:  PRILOSEC  Take 20 mg by mouth daily.     predniSONE 10 MG tablet  Commonly known as:  DELTASONE  Take 30 mg by mouth daily with breakfast.     traMADol 50 MG tablet  Commonly known as:  ULTRAM  Take 1 tablet (50 mg total) by mouth every 12 (twelve) hours as needed.     Vitamin D (Cholecalciferol) 1000 UNITS Tabs  Take 2 capsules by mouth daily.     warfarin 4 MG tablet  Commonly known as:  COUMADIN  Take 4 mg by mouth daily.       Follow-up  Information   Follow up with Jettie Booze., MD. (our office will call with date and time)    Specialty:  Interventional Cardiology   Contact information:   [address] [place] [address] [phone]       Follow up with [name], MD. (have your coumadin and CBC checked with Dr. Dorthy Cooler this week)    Specialty:  Family Medicine   Contact information:   [place] [address] [address] [phone]       Follow up with Ulla Potash., MD. (Dr. Serita Grit office will arrange injections to build up your blood.  They should call you Monday to arrange.)    Specialty:  Nephrology   Contact information:   [address] [place] [address] [phone]        Discharge Instructions: Call if chest pain or shortness of breath.  If heart rate increases again, call Dr. Irish Lack as before.  Heart Healthy low sodium diet.  Signed: Isaiah Serge Nurse Practitioner-Certified [facility]: [facility] 03/18/2014, 11:01 AM  Time spent on discharge : >30 minutes.

## 2014-03-18 NOTE — Progress Notes (Signed)
Pt ambulated to nurses station; HR from 120-140 during ambulation; O2 sats 85-93% on Room Air.  Pt states he feels great; denies SOB, CP, dizziness.  Cecilie Kicks, NP notified.

## 2014-03-18 NOTE — Progress Notes (Addendum)
Subjective: No chest pain, no SOB  Objective: Vital signs in last 24 hours: Temp:  [98 F (36.7 C)-98.6 F (37 C)] 98 F (36.7 C) (06/21 0500) Pulse Rate:  [64-100] 64 (06/21 0500) Resp:  [18-20] 18 (06/21 0500) BP: (96-127)/(60-79) 111/79 mmHg (06/21 0500) SpO2:  [95 %-96 %] 96 % (06/21 0500) Weight:  [178 lb 8 oz (80.967 kg)] 178 lb 8 oz (80.967 kg) (06/21 0500) Weight change: -2 lb 1.6 oz (-0.953 kg) Last BM Date: 03/17/14 Intake/Output from previous day: -1295 (total since admit -2445) 06/20 0701 - 06/21 0700 In: 1080 [P.O.:1080] Out: 2125 [Urine:2125] Intake/Output this shift:    PE: General:Pleasant affect, NAD Skin:Warm and dry, brisk capillary refill HEENT:normocephalic, sclera clear, mucus membranes moist Heart:irreg irreg without murmur, gallup, rub or click Lungs:clear without rales, rhonchi, or wheezes WJX:BJYNW, soft, non tender, + BS, do not palpate liver spleen or masses Ext:no lower ext edema, 2+ pedal pulses, 2+ radial pulses Neuro:alert and oriented, MAE, follows commands, + facial symmetry   Lab Results:  Recent Labs  03/17/14 0425 03/18/14 0245  WBC 5.8 6.0  HGB 7.2* 7.0*  HCT 21.0* 20.7*  PLT 210 239   BMET  Recent Labs  03/16/14 1745 03/17/14 0259  NA 134* 137  K 4.1 4.2  CL 94* 97  CO2 22 24  GLUCOSE 149* 100*  BUN 34* 34*  CREATININE 1.66* 1.47*  CALCIUM 9.5 9.3    Recent Labs  03/17/14 0259 03/17/14 0815  TROPONINI <0.30 <0.30    Lab Results  Component Value Date   CHOL 224* 03/17/2014   HDL 50 03/17/2014   LDLCALC 150* 03/17/2014   TRIG 122 03/17/2014   CHOLHDL 4.5 03/17/2014   No results found for this basename: HGBA1C     Lab Results  Component Value Date   TSH 0.560 03/17/2014    Hepatic Function Panel  Recent Labs  03/17/14 0259  PROT 5.9*  ALBUMIN 2.7*  AST 24  ALT 44  ALKPHOS 47  BILITOT 0.4  BILIDIR <0.2  IBILI NOT CALCULATED    Recent Labs  03/17/14 0259  CHOL 224*   No  results found for this basename: PROTIME,  in the last 72 hours     Studies/Results: Dg Chest Port 1 View  03/16/2014   CLINICAL DATA:  Atrial fibrillation with rapid ventricular response, began vomiting last night, shortness of breath, worse with exertion, intermittent dizziness, past history hyperlipidemia, hypertension, asthma, Goodpasture syndrome, chronic diastolic CHF  EXAM: PORTABLE CHEST - 1 VIEW  COMPARISON:  Portable exam 1808 hr compared to 12/29/2013  FINDINGS: Enlargement of cardiac silhouette with pulmonary vascular congestion.  Mediastinal contours normal.  Chronic accentuation of interstitial markings in the mid to lower lungs bilaterally, similar to previous exam.  No segmental consolidation, pleural effusion or pneumothorax.  Bones diffusely demineralized.  Iodide positioning with slight rotation.  IMPRESSION: Enlargement of cardiac silhouette with pulmonary vascular congestion.  Persistent interstitial prominence in the mid to lower lungs could represent minimal recurrent edema or developing chronic interstitial lung disease/fibrosis.   Electronically Signed   By: Lavonia Dana M.D.   On: 03/16/2014 18:27    Medications: I have reviewed the patient's current medications. Scheduled Meds: . cholecalciferol  2,000 Units Oral Daily  . [START ON 04/01/2014] cyanocobalamin  1,000 mcg Intramuscular Q30 days  . cyclobenzaprine  5 mg Oral QHS  . cyclophosphamide  100 mg Oral Daily  . dutasteride  0.5 mg Oral  Daily   And  . tamsulosin  0.4 mg Oral Daily  . feeding supplement (RESOURCE BREEZE)  1 Container Oral BID BM  . ferrous fumarate  1 tablet Oral Q breakfast  . furosemide  20 mg Oral Daily  . latanoprost  1 drop Both Eyes [id number]  . metoprolol tartrate  50 mg Oral BID  . montelukast  10 mg Oral QHS  . pantoprazole  40 mg Oral Daily  . predniSONE  30 mg Oral Q breakfast  . warfarin  4 mg Oral q1800  . Warfarin - Physician Dosing Inpatient   Does not apply q1800   Continuous  Infusions:  PRN Meds:.traMADol  Assessment/Plan: Active Problems: 1.  Atrial flutter:   D/C cardizem drip yesterday,  resumed and increased beta blocker to 50 BID, no anticoagulation with profound anemia-- HR increases with ambulation, up to 120 overall improved rate control. 2.  Anemia: Has had plasmaphoresis and blood transfusions Presumed form Goodpastures This is driving aflutter  H/H at 7.0/20.7 today rec'd aranesp yesterday and renal will arrange outpatient.  3.  INR 2.42--on  coumadin  4 . HTN stable 5.  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 Chronic diastolic HF Ambulate to check HR     LOS: 2 days   Time spent with pt. :15 minutes. [facility]  Nurse Practitioner Certified Pager [phone] or after 5pm and on weekends call [phone] 03/18/2014, 9:10 AM   Patient examined chart reviewed.  Received aranasp yesterday  His INR was subRx a few days ago and cannot have [facility] without TEE  Nephrology needs to see before d/c today and go over procrit schedule and procedure.  F/U Dr Irish Lack 3-4 weeks  Dr Ronalee Red to follow INR weekly ? With Hct  [facility] 304 weeks Continue current rate control meds  Jenkins Rouge

## 2014-03-20 ENCOUNTER — Telehealth: Payer: Self-pay | Admitting: Cardiology

## 2014-03-20 NOTE — Telephone Encounter (Signed)
OK to take metoprolol 75 mg BID based on discharge summary.

## 2014-03-20 NOTE — Telephone Encounter (Signed)
Please advise on metoprolol tartrate 50mg  tablet dose.  2 prescriptions sent to pharmacy on 6/21 with different instructions.  1. 1 tablet PO (50mg ) BID 2. 1.5 tablets PO (75mg ) BID  Please advise on dose clarification. Reference number is provided. Patient sees Dr. Irish Lack.

## 2014-03-20 NOTE — Telephone Encounter (Signed)
[phone]   Need to clarify the Metoprolol instructions. The second prescription, they need the name,strength and instructions.

## 2014-03-20 NOTE — Telephone Encounter (Signed)
Message sent to Dr.Varanasi for metoprolol directions.

## 2014-03-21 NOTE — Telephone Encounter (Signed)
Yes dose increased at discharge to 75 mg BID  thanks

## 2014-03-21 NOTE — Telephone Encounter (Signed)
Called [facility] pharmacy to inform them of correct med instructions.

## 2014-03-23 ENCOUNTER — Other Ambulatory Visit (HOSPITAL_COMMUNITY): Payer: Self-pay | Admitting: Nephrology

## 2014-03-23 ENCOUNTER — Encounter (HOSPITAL_COMMUNITY): Payer: Self-pay

## 2014-03-23 ENCOUNTER — Encounter (HOSPITAL_COMMUNITY)
Admission: RE | Admit: 2014-03-23 | Discharge: 2014-03-23 | Disposition: A | Discharge status: Home / Self Care | Payer: Medicare Other | Source: Ambulatory Visit | Attending: Nephrology | Admitting: Nephrology

## 2014-03-23 VITALS — BP 94/67 | HR 54 | Temp 98.4°F | Resp 18 | Ht 70.5 in | Wt 176.2 lb

## 2014-03-23 DIAGNOSIS — D638 Anemia in other chronic diseases classified elsewhere: Secondary | ICD-10-CM

## 2014-03-23 LAB — IRON AND TIBC
IRON: 109 ug/dL (ref 42–135)
Saturation Ratios: 43 % (ref 20–55)
TIBC: 253 ug/dL (ref 215–435)
UIBC: 144 ug/dL (ref 125–400)

## 2014-03-23 LAB — FERRITIN: FERRITIN: 3938 ng/mL — AB (ref 22–322)

## 2014-03-23 LAB — HEMOGLOBIN: Hemoglobin: 8.8 g/dL — ABNORMAL LOW (ref 13.0–17.0)

## 2014-03-23 MED ORDER — EPOETIN ALFA 10000 UNIT/ML IJ SOLN
10000.0000 [IU] | Freq: Once | INTRAMUSCULAR | Status: AC
  Administered 2014-03-23: 10000 [IU] via SUBCUTANEOUS

## 2014-03-23 MED ORDER — EPOETIN ALFA 40000 UNIT/ML IJ SOLN: 30000.0000 [IU] | Freq: Once | INTRAMUSCULAR | Status: DC

## 2014-03-23 MED ORDER — EPOETIN ALFA 20000 UNIT/ML IJ SOLN
20000.0000 [IU] | Freq: Once | INTRAMUSCULAR | Status: AC
  Administered 2014-03-23: 20000 [IU] via SUBCUTANEOUS
  Filled 2014-03-23: qty 1

## 2014-03-23 MED ORDER — EPOETIN ALFA 40000 UNIT/ML IJ SOLN
30000.0000 [IU] | INTRAMUSCULAR | Status: DC
  Filled 2014-03-23: qty 1

## 2014-03-23 NOTE — Discharge Instructions (Signed)

## 2014-03-27 ENCOUNTER — Telehealth: Payer: Self-pay | Admitting: Interventional Cardiology

## 2014-03-27 NOTE — Telephone Encounter (Signed)
Patient has fallen down twice, did have bad dizzy spells. Readings were running around 90-95/upper 40s-50s. Pt called Dr. Vernie Shanks with this info yesterday and he was told to decrease Metoprolol to 50 mg BID.

## 2014-03-27 NOTE — Telephone Encounter (Signed)
New Message  Pt called reports that he is was taking 75 mg of metoprolol upon discharge from hospital. When he arrived home he states that he fell twice and called his primary care whom advised him to decrease his dosage of metoprolol to 50 mg. Pt requests a call back to determine if this is ok. Please call

## 2014-03-28 NOTE — Telephone Encounter (Signed)
Agree with decreasing metoprolol

## 2014-03-28 NOTE — Telephone Encounter (Signed)
Pt notified, meds updated.

## 2014-03-30 ENCOUNTER — Inpatient Hospital Stay (HOSPITAL_COMMUNITY)
Admission: EM | Admit: 2014-03-30 | Discharge: 2014-04-06 | DRG: 871 | Disposition: A | Discharge status: Home / Self Care | Payer: Medicare Other | Attending: Internal Medicine | Admitting: Internal Medicine

## 2014-03-30 ENCOUNTER — Encounter (HOSPITAL_COMMUNITY): Payer: Self-pay | Admitting: Emergency Medicine

## 2014-03-30 ENCOUNTER — Ambulatory Visit (INDEPENDENT_AMBULATORY_CARE_PROVIDER_SITE_OTHER): Payer: Medicare Other | Admitting: Emergency Medicine

## 2014-03-30 ENCOUNTER — Ambulatory Visit (INDEPENDENT_AMBULATORY_CARE_PROVIDER_SITE_OTHER): Payer: Medicare Other

## 2014-03-30 ENCOUNTER — Inpatient Hospital Stay (HOSPITAL_COMMUNITY): Payer: Medicare Other

## 2014-03-30 VITALS — BP 106/72 | HR 108 | Temp 98.3°F | Resp 16 | Ht 70.0 in | Wt 176.0 lb

## 2014-03-30 DIAGNOSIS — N058 Unspecified nephritic syndrome with other morphologic changes: Secondary | ICD-10-CM | POA: Diagnosis present

## 2014-03-30 DIAGNOSIS — I4892 Unspecified atrial flutter: Secondary | ICD-10-CM

## 2014-03-30 DIAGNOSIS — I2789 Other specified pulmonary heart diseases: Secondary | ICD-10-CM | POA: Diagnosis present

## 2014-03-30 DIAGNOSIS — IMO0002 Reserved for concepts with insufficient information to code with codable children: Secondary | ICD-10-CM

## 2014-03-30 DIAGNOSIS — D473 Essential (hemorrhagic) thrombocythemia: Secondary | ICD-10-CM

## 2014-03-30 DIAGNOSIS — J9601 Acute respiratory failure with hypoxia: Secondary | ICD-10-CM

## 2014-03-30 DIAGNOSIS — Z86711 Personal history of pulmonary embolism: Secondary | ICD-10-CM

## 2014-03-30 DIAGNOSIS — N183 Chronic kidney disease, stage 3 unspecified: Secondary | ICD-10-CM | POA: Diagnosis present

## 2014-03-30 DIAGNOSIS — I4891 Unspecified atrial fibrillation: Secondary | ICD-10-CM | POA: Diagnosis present

## 2014-03-30 DIAGNOSIS — R9430 Abnormal result of cardiovascular function study, unspecified: Secondary | ICD-10-CM | POA: Diagnosis present

## 2014-03-30 DIAGNOSIS — I2699 Other pulmonary embolism without acute cor pulmonale: Secondary | ICD-10-CM

## 2014-03-30 DIAGNOSIS — I129 Hypertensive chronic kidney disease with stage 1 through stage 4 chronic kidney disease, or unspecified chronic kidney disease: Secondary | ICD-10-CM | POA: Diagnosis present

## 2014-03-30 DIAGNOSIS — I483 Typical atrial flutter: Secondary | ICD-10-CM

## 2014-03-30 DIAGNOSIS — I7782 Antineutrophilic cytoplasmic antibody (ANCA) vasculitis: Secondary | ICD-10-CM

## 2014-03-30 DIAGNOSIS — Z87891 Personal history of nicotine dependence: Secondary | ICD-10-CM

## 2014-03-30 DIAGNOSIS — I5032 Chronic diastolic (congestive) heart failure: Secondary | ICD-10-CM

## 2014-03-30 DIAGNOSIS — A419 Sepsis, unspecified organism: Principal | ICD-10-CM | POA: Diagnosis present

## 2014-03-30 DIAGNOSIS — E785 Hyperlipidemia, unspecified: Secondary | ICD-10-CM | POA: Diagnosis present

## 2014-03-30 DIAGNOSIS — I776 Arteritis, unspecified: Secondary | ICD-10-CM

## 2014-03-30 DIAGNOSIS — Z86718 Personal history of other venous thrombosis and embolism: Secondary | ICD-10-CM

## 2014-03-30 DIAGNOSIS — R509 Fever, unspecified: Secondary | ICD-10-CM

## 2014-03-30 DIAGNOSIS — I82409 Acute embolism and thrombosis of unspecified deep veins of unspecified lower extremity: Secondary | ICD-10-CM

## 2014-03-30 DIAGNOSIS — R0902 Hypoxemia: Secondary | ICD-10-CM

## 2014-03-30 DIAGNOSIS — Z823 Family history of stroke: Secondary | ICD-10-CM

## 2014-03-30 DIAGNOSIS — I5033 Acute on chronic diastolic (congestive) heart failure: Secondary | ICD-10-CM | POA: Diagnosis present

## 2014-03-30 DIAGNOSIS — E871 Hypo-osmolality and hyponatremia: Secondary | ICD-10-CM

## 2014-03-30 DIAGNOSIS — I482 Chronic atrial fibrillation, unspecified: Secondary | ICD-10-CM

## 2014-03-30 DIAGNOSIS — D75839 Thrombocytosis, unspecified: Secondary | ICD-10-CM

## 2014-03-30 DIAGNOSIS — M310 Hypersensitivity angiitis: Secondary | ICD-10-CM

## 2014-03-30 DIAGNOSIS — R0602 Shortness of breath: Secondary | ICD-10-CM

## 2014-03-30 DIAGNOSIS — Z7901 Long term (current) use of anticoagulants: Secondary | ICD-10-CM

## 2014-03-30 DIAGNOSIS — J189 Pneumonia, unspecified organism: Secondary | ICD-10-CM | POA: Diagnosis present

## 2014-03-30 DIAGNOSIS — Z8249 Family history of ischemic heart disease and other diseases of the circulatory system: Secondary | ICD-10-CM

## 2014-03-30 DIAGNOSIS — I509 Heart failure, unspecified: Secondary | ICD-10-CM | POA: Diagnosis present

## 2014-03-30 DIAGNOSIS — D638 Anemia in other chronic diseases classified elsewhere: Secondary | ICD-10-CM | POA: Diagnosis present

## 2014-03-30 DIAGNOSIS — J8410 Pulmonary fibrosis, unspecified: Secondary | ICD-10-CM | POA: Diagnosis present

## 2014-03-30 DIAGNOSIS — I10 Essential (primary) hypertension: Secondary | ICD-10-CM

## 2014-03-30 DIAGNOSIS — J9600 Acute respiratory failure, unspecified whether with hypoxia or hypercapnia: Secondary | ICD-10-CM | POA: Diagnosis present

## 2014-03-30 DIAGNOSIS — N059 Unspecified nephritic syndrome with unspecified morphologic changes: Secondary | ICD-10-CM

## 2014-03-30 DIAGNOSIS — Z6825 Body mass index (BMI) 25.0-25.9, adult: Secondary | ICD-10-CM

## 2014-03-30 DIAGNOSIS — D649 Anemia, unspecified: Secondary | ICD-10-CM

## 2014-03-30 HISTORY — DX: Abnormal result of cardiovascular function study, unspecified: R94.30

## 2014-03-30 LAB — URINALYSIS, ROUTINE W REFLEX MICROSCOPIC
Bilirubin Urine: NEGATIVE
GLUCOSE, UA: NEGATIVE mg/dL
Ketones, ur: NEGATIVE mg/dL
LEUKOCYTES UA: NEGATIVE
Nitrite: NEGATIVE
PH: 7 (ref 5.0–8.0)
PROTEIN: 30 mg/dL — AB
SPECIFIC GRAVITY, URINE: 1.013 (ref 1.005–1.030)
Urobilinogen, UA: 1 mg/dL (ref 0.0–1.0)

## 2014-03-30 LAB — COMPREHENSIVE METABOLIC PANEL
ALT: 37 U/L (ref 0–53)
AST: 24 U/L (ref 0–37)
Albumin: 2.5 g/dL — ABNORMAL LOW (ref 3.5–5.2)
Alkaline Phosphatase: 54 U/L (ref 39–117)
Anion gap: 13 (ref 5–15)
BUN: 27 mg/dL — ABNORMAL HIGH (ref 6–23)
CO2: 23 mEq/L (ref 19–32)
Calcium: 8.7 mg/dL (ref 8.4–10.5)
Chloride: 93 mEq/L — ABNORMAL LOW (ref 96–112)
Creatinine, Ser: 1.54 mg/dL — ABNORMAL HIGH (ref 0.50–1.35)
GFR calc non Af Amer: 43 mL/min — ABNORMAL LOW (ref >90)
GFR, EST AFRICAN AMERICAN: 50 mL/min — AB (ref >90)
Glucose, Bld: 90 mg/dL (ref 70–99)
POTASSIUM: 4.3 meq/L (ref 3.7–5.3)
SODIUM: 129 meq/L — AB (ref 137–147)
TOTAL PROTEIN: 5.5 g/dL — AB (ref 6.0–8.3)
Total Bilirubin: 0.9 mg/dL (ref 0.3–1.2)

## 2014-03-30 LAB — URINE MICROSCOPIC-ADD ON

## 2014-03-30 LAB — CBC WITH DIFFERENTIAL/PLATELET
Basophils Absolute: 0 10*3/uL (ref 0.0–0.1)
Basophils Relative: 0 % (ref 0–1)
EOS ABS: 0.2 10*3/uL (ref 0.0–0.7)
Eosinophils Relative: 5 % (ref 0–5)
HCT: 24.3 % — ABNORMAL LOW (ref 39.0–52.0)
Hemoglobin: 8.3 g/dL — ABNORMAL LOW (ref 13.0–17.0)
LYMPHS PCT: 4 % — AB (ref 12–46)
Lymphs Abs: 0.2 10*3/uL — ABNORMAL LOW (ref 0.7–4.0)
MCH: 32.9 pg (ref 26.0–34.0)
MCHC: 34.2 g/dL (ref 30.0–36.0)
MCV: 96.4 fL (ref 78.0–100.0)
MONO ABS: 0.4 10*3/uL (ref 0.1–1.0)
Monocytes Relative: 9 % (ref 3–12)
NEUTROS PCT: 82 % — AB (ref 43–77)
Neutro Abs: 3.4 10*3/uL (ref 1.7–7.7)
Platelets: 236 10*3/uL (ref 150–400)
RBC: 2.52 MIL/uL — ABNORMAL LOW (ref 4.22–5.81)
RDW: 22 % — ABNORMAL HIGH (ref 11.5–15.5)
WBC: 4.2 10*3/uL (ref 4.0–10.5)

## 2014-03-30 LAB — I-STAT CG4 LACTIC ACID, ED: Lactic Acid, Venous: 0.84 mmol/L (ref 0.5–2.2)

## 2014-03-30 LAB — PRO B NATRIURETIC PEPTIDE: PRO B NATRI PEPTIDE: 7170 pg/mL — AB (ref 0–125)

## 2014-03-30 LAB — TROPONIN I: Troponin I: <0.3 ng/mL (ref <0.30)

## 2014-03-30 MED ORDER — DEXTROSE 5 % IV SOLN
2.0000 g | Freq: Once | INTRAVENOUS | Status: AC
  Administered 2014-03-30: 2 g via INTRAVENOUS
  Filled 2014-03-30: qty 2

## 2014-03-30 MED ORDER — SODIUM CHLORIDE 0.9 % IV SOLN: 1000.0000 mL | INTRAVENOUS | Status: DC

## 2014-03-30 MED ORDER — VANCOMYCIN HCL IN DEXTROSE 1-5 GM/200ML-% IV SOLN
1000.0000 mg | Freq: Once | INTRAVENOUS | Status: AC
  Administered 2014-03-30: 1000 mg via INTRAVENOUS
  Filled 2014-03-30: qty 200

## 2014-03-30 MED ORDER — VANCOMYCIN HCL IN DEXTROSE 750-5 MG/150ML-% IV SOLN
750.0000 mg | Freq: Two times a day (BID) | INTRAVENOUS | Status: DC
  Administered 2014-03-31 – 2014-04-01 (×3): 750 mg via INTRAVENOUS
  Filled 2014-03-30 (×4): qty 150

## 2014-03-30 MED ORDER — SODIUM CHLORIDE 0.9 % IV BOLUS (SEPSIS)
30.0000 mL/kg | Freq: Once | INTRAVENOUS | Status: AC
  Administered 2014-03-30: 2394 mL via INTRAVENOUS

## 2014-03-30 MED ORDER — DEXTROSE 5 % IV SOLN
1.0000 g | Freq: Two times a day (BID) | INTRAVENOUS | Status: DC
  Administered 2014-03-31 – 2014-04-01 (×3): 1 g via INTRAVENOUS
  Filled 2014-03-30 (×3): qty 1

## 2014-03-30 NOTE — ED Notes (Addendum)
Patient with weakness, fever, dizziness.  Patient reports to EMS that he has Good Pastures Syndrome which he reports is a blood disorder which he is seen frequently for. History of A-fib, patient is now in atrial flutter with a rate of 100.

## 2014-03-30 NOTE — ED Provider Notes (Signed)
CSN: [id number]     Arrival date & time 03/30/14  1844 History   First MD Initiated Contact with Patient 03/30/14 1935     Chief Complaint  Patient presents with  . Weakness     (Consider location/radiation/quality/duration/timing/severity/associated sxs/prior Treatment) The history is provided by the patient and medical records. No language interpreter was used.    Bryan Hubbard is a 73 y.o. male  with a hx of Goodpasture's treated with plasmapheresis and dialysis, a-fib presents to the Emergency Department complaining of gradual, persistent, progressively worsening fatigue, SOB onset several weeks ago, but acutely worse today with increased fatigue. Pt's wife reports he is taking prednisone which was reduced from 30mg  to 20mg  yesterday.  Associated symptoms include lightheadedness today without syncope.  Pt reports good days and bad days; with today being a "bad day" and staying in bed all day.  He reports he developed a fever to 102 at home today which is prompted the visit to Urgent Care today.  Pt reports concerns about infection.  Pt denies coughingor hemoptysis  He reports increased SOB last night persistent into today.  Exertion makes the SOB worse and rest makes it better.  Pt denies headache, neck pain, chest pain, abd pain, N/V/D, dizziness, syncope, dysuria, hematuria, rash.    Past Medical History  Diagnosis Date  . Atrial flutter started in 2011  . Chronic diastolic heart failure   . DVT   . HYPERLIPIDEMIA   . HYPERTENSION, BENIGN   . ALLERGIC ASTHMA   . Palpitations   . H/O Goodpasture's syndrome     "autoimmune disease; affects kidneys and lungs"  . DVT (deep venous thrombosis) 09/2001    "right groin"  . PE (pulmonary embolism) 09/2001  . CHF (congestive heart failure)   . Anemia   . History of blood transfusion     "related to Goodpasture's syndrome; plasmapheresis process"  . Stomach ulcer   . Arthritis     "hands, knees" (03/16/2014)  . Gout   . Renal  insufficiency     "related to the Goodpasture's"  . Atrial flutter with rapid ventricular response 03/16/2014  . Acute on chronic diastolic HF (heart failure) in combination with rapid a flutter and anemia 03/18/2014   Past Surgical History  Procedure Laterality Date  . Renal biopsy, percutaneous  12/2013  . Vena cava filter placement  09/2001   Family History  Problem Relation Age of Onset  . Stroke Mother   . Heart attack Maternal Grandmother   . Coronary artery disease      negative history of early CAD   History  Substance Use Topics  . Smoking status: Former Smoker -- 1.00 packs/day for 6 years    Types: Cigarettes    Quit date: 05/02/1967  . Smokeless tobacco: Never Used  . Alcohol Use: 8.4 oz/week    14 Glasses of wine per week     Comment: drink 1 drink a day    Review of Systems  Constitutional: Positive for fever and fatigue. Negative for diaphoresis, appetite change and unexpected weight change.  HENT: Negative for mouth sores.   Eyes: Negative for visual disturbance.  Respiratory: Positive for shortness of breath. Negative for cough, chest tightness and wheezing.   Cardiovascular: Negative for chest pain.  Gastrointestinal: Negative for nausea, vomiting, abdominal pain, diarrhea and constipation.  Endocrine: Negative for polydipsia, polyphagia and polyuria.  Genitourinary: Negative for dysuria, urgency, frequency and hematuria.  Musculoskeletal: Negative for back pain and neck stiffness.  Skin:  Negative for rash.  Allergic/Immunologic: Negative for immunocompromised state.  Neurological: Positive for weakness. Negative for syncope, light-headedness and headaches.  Hematological: Does not bruise/bleed easily.  Psychiatric/Behavioral: Negative for sleep disturbance. The patient is not nervous/anxious.       Allergies  Review of patient's allergies indicates no known allergies.  Home Medications   Prior to Admission medications   Medication Sig Start Date End  Date Taking? Authorizing Provider  bimatoprost (LUMIGAN) 0.01 % SOLN Place 1 drop into both eyes every morning.   Yes Historical Provider, MD  cyanocobalamin (,VITAMIN B-12,) 1000 MCG/ML injection Inject 1,000 mcg into the muscle every 30 (thirty) days.    Yes Historical Provider, MD  cyclobenzaprine (FLEXERIL) 5 MG tablet Take 5 mg by mouth at bedtime.   Yes Historical Provider, MD  cyclophosphamide (CYTOXAN) 50 MG tablet Take 100 mg by mouth daily. Give on an empty stomach 1 hour before or 2 hours after meals.   Yes Historical Provider, MD  Dutasteride-Tamsulosin HCl 0.5-0.4 MG CAPS Take 1 capsule by mouth daily.   Yes Historical Provider, MD  furosemide (LASIX) 20 MG tablet Take 1 tablet (20 mg total) by mouth daily. 12/30/13  Yes Belkys A Regalado, MD  metoprolol (LOPRESSOR) 50 MG tablet Take 50 mg by mouth 2 (two) times daily. 03/18/14  Yes Cecilie Kicks, NP  montelukast (SINGULAIR) 10 MG tablet Take 10 mg by mouth at bedtime.  06/10/13  Yes Historical Provider, MD  omeprazole (PRILOSEC) 20 MG capsule Take 20 mg by mouth daily.  08/17/13  Yes Historical Provider, MD  predniSONE (DELTASONE) 10 MG tablet Take 20 mg by mouth daily with breakfast.    Yes Historical Provider, MD  traMADol (ULTRAM) 50 MG tablet Take 1 tablet (50 mg total) by mouth every 12 (twelve) hours as needed. 12/30/13  Yes Belkys A Regalado, MD  Vitamin D, Cholecalciferol, 1000 UNITS TABS Take 2,000 Units by mouth daily.    Yes Historical Provider, MD  warfarin (COUMADIN) 1 MG tablet Take 0.5 mg by mouth daily.   Yes Historical Provider, MD  warfarin (COUMADIN) 4 MG tablet Take 4 mg by mouth daily.   Yes Historical Provider, MD   BP 108/78  Pulse 113  Temp(Src) 98.7 F (37.1 C) (Oral)  Resp 21  Ht 5\' 10"  (1.778 m)  Wt 176 lb (79.833 kg)  BMI 25.25 kg/m2  SpO2 96% Physical Exam  Nursing note and vitals reviewed. Constitutional: He is oriented to person, place, and time. He appears well-developed and well-nourished. No  distress.  Awake, alert, nontoxic appearance Visibly short of breath  HENT:  Head: Normocephalic and atraumatic.  Mouth/Throat: Oropharynx is clear and moist. No oropharyngeal exudate.  Eyes: Conjunctivae are normal. No scleral icterus.  Neck: Normal range of motion. Neck supple.  Cardiovascular: Normal heart sounds and intact distal pulses.  An irregularly irregular rhythm present. Tachycardia present.   Pulses:      Radial pulses are 2+ on the right side, and 2+ on the left side.       Posterior tibial pulses are 2+ on the right side, and 2+ on the left side.  Capillary refill < 3 sec  Pulmonary/Chest: Effort normal and breath sounds normal. No respiratory distress. He has no wheezes.  Clear and equal breath sounds without focal rales, rhonchi or wheezes  Abdominal: Soft. Bowel sounds are normal. He exhibits no distension and no mass. There is no tenderness. There is no rebound and no guarding.  Musculoskeletal: Normal range of motion. He  exhibits no edema.  Neurological: He is alert and oriented to person, place, and time. He exhibits normal muscle tone. Coordination normal.  Speech is clear and goal oriented Moves extremities without ataxia  Skin: Skin is warm and dry. No rash noted. He is not diaphoretic. No erythema. There is pallor.  Psychiatric: He has a normal mood and affect.    ED Course  Procedures (including critical care time) Labs Review Labs Reviewed  CBC WITH DIFFERENTIAL - Abnormal; Notable for the following:    RBC 2.52 (*)    Hemoglobin 8.3 (*)    HCT 24.3 (*)    RDW 22.0 (*)    Neutrophils Relative % 82 (*)    Lymphocytes Relative 4 (*)    Lymphs Abs 0.2 (*)    All other components within normal limits  COMPREHENSIVE METABOLIC PANEL - Abnormal; Notable for the following:    Sodium 129 (*)    Chloride 93 (*)    BUN 27 (*)    Creatinine, Ser 1.54 (*)    Total Protein 5.5 (*)    Albumin 2.5 (*)    GFR calc non Af Amer 43 (*)    GFR calc Af Amer 50 (*)     All other components within normal limits  URINALYSIS, ROUTINE W REFLEX MICROSCOPIC - Abnormal; Notable for the following:    Hgb urine dipstick MODERATE (*)    Protein, ur 30 (*)    All other components within normal limits  PRO B NATRIURETIC PEPTIDE - Abnormal; Notable for the following:    Pro B Natriuretic peptide (BNP) 7170.0 (*)    All other components within normal limits  CULTURE, BLOOD (ROUTINE X 2)  CULTURE, BLOOD (ROUTINE X 2)  URINE CULTURE  TROPONIN I  URINE MICROSCOPIC-ADD ON  I-STAT CG4 LACTIC ACID, ED    Imaging Review Dg Chest 2 View  03/30/2014   CLINICAL DATA:  Several month history of shortness of breath with history of CHF.  EXAM: CHEST  2 VIEW  COMPARISON:  Portable chest x-ray of March 16, 2014  FINDINGS: The lungs are well-expanded. The interstitial markings are mildly prominent. The cardiac silhouette is top-normal in size. The pulmonary vascularity is not clearly engorged. There is no pleural effusion. The bony thorax is unremarkable.  IMPRESSION: Mildly increased interstitial markings may reflect interstitial pneumonia but mild CHF may produce similar findings. There is no classic alveolar pneumonia.   Electronically Signed   By: David  Martinique   On: 03/30/2014 18:17     EKG Interpretation None      MDM   Final diagnoses:  Hypoxia  Hyponatremia  SOB (shortness of breath)  CKD (chronic kidney disease) stage 3, GFR 30-59 ml/min   Goodpasture's disease, treated with steroid, oral cytoxan and plasmapheresis    Chronic atrial flutter  Acute on chronic diastolic HF (heart failure) in combination with rapid a flutter and anemia  Anemia of chronic disease   KENNETT SYMES presents with increased SOB, fatigue and fever shortly after d/c from the hospital for treatment of a-fib.  Pt without CP today, but increased SOB.  CXR from urgent care shows concern for possible pneumonia; will begin treatment for HCAP.  Pt with a-fib on the monitor and tachycardia to 110  at rest.  He reports no hx of tachycardia at rest, only with exertion.      8:29 PM Pt with hypoxia to 86% on room air.  Oxygen replaced; pt in no distress.  Pt without leukocytosis;  anemia largely at baseline.  UA without evidence of UTI.  Lactic acid normal and troponin normal.  ECG from Urgent care with a-fib. BNP elevated to 7000.  Anticipate admission.  Pt has been treated for HCAP and blood cultures are pending.    11:12 PM Discussed with Dr. Darnell Level who recommends CT chest at this time and there is not a clear etiology for patient's hypoxia;  Pneumonia vs CHF vs flare of Goodpasture's disease.    BP 108/78  Pulse 113  Temp(Src) 98.7 F (37.1 C) (Oral)  Resp 21  Ht 5\' 10"  (1.778 m)  Wt 176 lb (79.833 kg)  BMI 25.25 kg/m2  SpO2 96%    Abigail Butts, PA-C 03/30/14 2316

## 2014-03-30 NOTE — ED Notes (Signed)
Patient transported to CT 

## 2014-03-30 NOTE — ED Notes (Signed)
Bed: WA06 Expected date:  Expected time:  Means of arrival:  Comments: EMS- 73yo M, weakness

## 2014-03-30 NOTE — Progress Notes (Signed)
[facility] [address] [place] [address] [phone]  Date:  03/30/2014   Name:  Bryan Hubbard   DOB:  08-04-1941   MRN:  [id number]  PCP:  [name], MD    Chief Complaint: Shortness of Breath and Anemia   History of Present Illness:  Bryan Hubbard is a 73 y.o. 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  He was discharged from the hospital in atrial flutter with a hemoglobin of 7.2.  He desaturated in the hospital with a sat as low as 83%. He describes an acute change in his status today with increased shortness of breath at rest and weakness and an inability to get out of bed due to fatigue.  He had a measured temp of 102 this afternoon.  Denies ear pain, sore throat, cough, nausea or vomiting, abdominal pain.  He has no stool change or rash. Denies other complaint or health concern today.   Patient Active Problem List   Diagnosis Date Noted  .  Goodpasture's disease, treated with steroid, oral cytoxan and plasmapheresis   03/18/2014  . Chronic atrial flutter 03/18/2014  . Acute on chronic diastolic HF (heart failure) in combination with rapid a flutter and anemia 03/18/2014  . Abdominal pain, resolved, negative amylase and lipase 03/18/2014  . Chronic diastolic heart failure 26/37/8588  . 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 Anemia of chronic disease 12/24/2013  . Thrombocytosis 12/24/2013  . Glomerulonephritis 12/23/2013  . Leukocytosis 12/18/2013  . Community acquired pneumonia 12/18/2013  . AKI (acute kidney injury) 12/18/2013  . Atrial flutter with RVR, rate controlled at discharge 08/31/2013  . HYPERLIPIDEMIA 10/09/2010  . HYPERTENSION, BENIGN 10/09/2010  . Atrial fibrillation 10/09/2010  . CAP (community acquired pneumonia) 10/09/2010  . DVT 10/09/2010  . ALLERGIC ASTHMA 10/09/2010  . PALPITATIONS 10/09/2010     Past Medical History  Diagnosis Date  . Atrial flutter started in 2011  . Chronic diastolic heart failure   . DVT   . HYPERLIPIDEMIA   . HYPERTENSION, BENIGN   . ALLERGIC ASTHMA   . Palpitations   . H/O Goodpasture's syndrome     "autoimmune disease; affects kidneys and lungs"  . DVT (deep venous thrombosis) 09/2001    "right groin"  . PE (pulmonary embolism) 09/2001  . CHF (congestive heart failure)   . Anemia   . History of blood transfusion     "related to Goodpasture's syndrome; plasmapheresis process"  . Stomach ulcer   . Arthritis     "hands, knees" (03/16/2014)  . Gout   . Renal insufficiency     "related to the Goodpasture's"  . Atrial flutter with rapid ventricular response 03/16/2014  . Acute on chronic diastolic HF (heart failure) in combination with rapid a flutter and anemia 03/18/2014    Past Surgical History  Procedure Laterality Date  . Renal biopsy, percutaneous  12/2013  . Vena cava filter placement  09/2001    History  Substance Use Topics  . Smoking status: Former Smoker -- 1.00 packs/day for 6 years    Types: Cigarettes    Quit date: 05/02/1967  . Smokeless tobacco: Never Used  . Alcohol Use: 8.4 oz/week    14 Glasses of wine per week     Comment: drink 1-2 glasses of wine, 6oz pour,  with dinner every day    Family History  Problem Relation Age of Onset  .  Stroke Mother   . Heart attack Maternal Grandmother   . Coronary artery disease      negative history of early CAD    No Known Allergies  Medication list has been reviewed and updated.  Current Outpatient Prescriptions on File Prior to Visit  Medication Sig Dispense Refill  . bimatoprost (LUMIGAN) 0.01 % SOLN Place 1 drop into both eyes every morning.      . cyanocobalamin (,VITAMIN B-12,) 1000 MCG/ML injection Inject 1,000 mcg into the muscle every 30 (thirty) days.       . cyclobenzaprine (FLEXERIL) 5 MG tablet Take 5 mg by mouth at bedtime.      . cyclophosphamide (CYTOXAN) 50 MG  tablet Take 100 mg by mouth daily. Give on an empty stomach 1 hour before or 2 hours after meals.      . Dutasteride-Tamsulosin HCl 0.5-0.4 MG CAPS Take 1 capsule by mouth daily.      . ferrous fumarate-iron polysaccharide complex (TANDEM) 162-115.2 MG CAPS Take 1 capsule by mouth daily with breakfast. One capsule/daily of Integra (120 mg iron and 40 mg Vit C)      . furosemide (LASIX) 20 MG tablet Take 1 tablet (20 mg total) by mouth daily.  30 tablet  0  . metoprolol (LOPRESSOR) 50 MG tablet Take 50 mg by mouth 2 (two) times daily.      . montelukast (SINGULAIR) 10 MG tablet Take 10 mg by mouth at bedtime.       Marland Kitchen omeprazole (PRILOSEC) 20 MG capsule Take 20 mg by mouth daily.       . predniSONE (DELTASONE) 10 MG tablet Take 30 mg by mouth daily with breakfast.      . traMADol (ULTRAM) 50 MG tablet Take 1 tablet (50 mg total) by mouth every 12 (twelve) hours as needed.  30 tablet  0  . Vitamin D, Cholecalciferol, 1000 UNITS TABS Take 2 capsules by mouth daily.      Marland Kitchen warfarin (COUMADIN) 4 MG tablet Take 4 mg by mouth daily.       No current facility-administered medications on file prior to visit.    Review of Systems:  As per HPI, otherwise negative.    Physical Examination: Filed Vitals:   03/30/14 1727  BP: 106/72  Pulse:   Temp:   Resp:    Filed Vitals:   03/30/14 1725  Height: 5\' 10"  (1.778 m)  Weight: 176 lb (79.833 kg)   Body mass index is 25.25 kg/(m^2). Ideal Body Weight: Weight in (lb) to have BMI = 25: 173.9  GEN: WDWN, ill appearing, Non-toxic, A & O x 3 HEENT: Atraumatic, Normocephalic. Neck supple. No masses, No LAD. Ears and Nose: No external deformity. CV:  RRR, No M/G/R. No JVD. No thrill. No extra heart sounds. PULM: CTA B, no wheezes, crackles, rhonchi. No retractions. No resp. distress. No accessory muscle use. ABD: S, NT, ND, +BS. No rebound. No HSM. EXTR: No c/c/e NEURO Normal gait.  PSYCH: Normally interactive. Conversant. Not depressed or anxious  appearing.  Calm demeanor.    Assessment and Plan: Atrial flutter Shortness of breath Fatigue Fever Goodpasture's syndrome To ER  Signed,  Ellison Carwin, MD   [facility] reading (PRIMARY) by  Dr. Ouida Sills.  Diffuse infiltrates.

## 2014-03-30 NOTE — H&P (Signed)
PCP: [name], MD  Nephrology Dr. Posey Pronto Cardiology Dr. Scarlette Calico  Chief Complaint:  fever  HPI: Bryan Hubbard is a 73 y.o. male   has a past medical history of Atrial flutter (started in 2011); Chronic diastolic heart failure; DVT; HYPERLIPIDEMIA; HYPERTENSION, BENIGN; ALLERGIC ASTHMA; Palpitations; H/O Goodpasture's syndrome; DVT (deep venous thrombosis) (09/2001); PE (pulmonary embolism) (09/2001); CHF (congestive heart failure); Anemia; History of blood transfusion; Stomach ulcer; Arthritis; Gout; Renal insufficiency; Atrial flutter with rapid ventricular response (03/16/2014); and Acute on chronic diastolic HF (heart failure) in combination with rapid a flutter and anemia (03/18/2014).   Presented with  Patient have had fever, chills, and increased short of breath. Patient slept all day and felt very weak and had chills.  NO cough, reports some nausea but no vomiting. No abdominal pain no diarrhea.  Patietn has hx of Goodpasture disease currently on prednisone and cytoxan sp a course of plasmaphoresis he is followed for this by Dr. Posey Pronto. Patient never had hemoptysis. He has known history of PE status post IVC filter as well as history of A. fib/A. flutter for by cardiology currently on Coumadin for prophylaxis of CVA. Patient reports that in the past his metoprolol dose to be adjusted down secondary to hypotension. His blood pressure usually runs in the low 100-110. He have not had any recent syncopal events. 19 chest pains. For the past few days his shortness of breath has been worse denies any cough.  Hospitalist was called for admission for CAP w A.fib w RVR  Review of Systems:    Pertinent positives include:   Fevers, chills, fatigue,   Constitutional:  No weight loss, night sweats,weight loss  HEENT:  No headaches, Difficulty swallowing,Tooth/dental problems,Sore throat,  No sneezing, itching, ear ache, nasal congestion, post nasal drip,  Cardio-vascular:  No chest pain,  Orthopnea, PND, anasarca, dizziness, palpitations.no Bilateral lower extremity swelling  GI:  No heartburn, indigestion, abdominal pain, nausea, vomiting, diarrhea, change in bowel habits, loss of appetite, melena, blood in stool, hematemesis Resp:  no shortness of breath at rest. No dyspnea on exertion, No excess mucus, no productive cough, No non-productive cough, No coughing up of blood.No change in color of mucus.No wheezing. Skin:  no rash or lesions. No jaundice GU:  no dysuria, change in color of urine, no urgency or frequency. No straining to urinate.  No flank pain.  Musculoskeletal:  No joint pain or no joint swelling. No decreased range of motion. No back pain.  Psych:  No change in mood or affect. No depression or anxiety. No memory loss.  Neuro: no localizing neurological complaints, no tingling, no weakness, no double vision, no gait abnormality, no slurred speech, no confusion  Otherwise ROS are negative except for above, 10 systems were reviewed  Past Medical History: Past Medical History  Diagnosis Date  . Atrial flutter started in 2011  . Chronic diastolic heart failure   . DVT   . HYPERLIPIDEMIA   . HYPERTENSION, BENIGN   . ALLERGIC ASTHMA   . Palpitations   . H/O Goodpasture's syndrome     "autoimmune disease; affects kidneys and lungs"  . DVT (deep venous thrombosis) 09/2001    "right groin"  . PE (pulmonary embolism) 09/2001  . CHF (congestive heart failure)   . Anemia   . History of blood transfusion     "related to Goodpasture's syndrome; plasmapheresis process"  . Stomach ulcer   . Arthritis     "hands, knees" (03/16/2014)  . Gout   .  Renal insufficiency     "related to the Goodpasture's"  . Atrial flutter with rapid ventricular response 03/16/2014  . Acute on chronic diastolic HF (heart failure) in combination with rapid a flutter and anemia 03/18/2014   Past Surgical History  Procedure Laterality Date  . Renal biopsy, percutaneous  12/2013  . Vena  cava filter placement  09/2001     Medications: Prior to Admission medications   Medication Sig Start Date End Date Taking? Authorizing Provider  bimatoprost (LUMIGAN) 0.01 % SOLN Place 1 drop into both eyes every morning.   Yes Historical Provider, MD  cyanocobalamin (,VITAMIN B-12,) 1000 MCG/ML injection Inject 1,000 mcg into the muscle every 30 (thirty) days.    Yes Historical Provider, MD  cyclobenzaprine (FLEXERIL) 5 MG tablet Take 5 mg by mouth at bedtime.   Yes Historical Provider, MD  cyclophosphamide (CYTOXAN) 50 MG tablet Take 100 mg by mouth daily. Give on an empty stomach 1 hour before or 2 hours after meals.   Yes Historical Provider, MD  Dutasteride-Tamsulosin HCl 0.5-0.4 MG CAPS Take 1 capsule by mouth daily.   Yes Historical Provider, MD  furosemide (LASIX) 20 MG tablet Take 1 tablet (20 mg total) by mouth daily. 12/30/13  Yes Belkys A Regalado, MD  metoprolol (LOPRESSOR) 50 MG tablet Take 50 mg by mouth 2 (two) times daily. 03/18/14  Yes Cecilie Kicks, NP  montelukast (SINGULAIR) 10 MG tablet Take 10 mg by mouth at bedtime.  06/10/13  Yes Historical Provider, MD  omeprazole (PRILOSEC) 20 MG capsule Take 20 mg by mouth daily.  08/17/13  Yes Historical Provider, MD  predniSONE (DELTASONE) 10 MG tablet Take 20 mg by mouth daily with breakfast.    Yes Historical Provider, MD  traMADol (ULTRAM) 50 MG tablet Take 1 tablet (50 mg total) by mouth every 12 (twelve) hours as needed. 12/30/13  Yes Belkys A Regalado, MD  Vitamin D, Cholecalciferol, 1000 UNITS TABS Take 2,000 Units by mouth daily.    Yes Historical Provider, MD  warfarin (COUMADIN) 1 MG tablet Take 0.5 mg by mouth daily.   Yes Historical Provider, MD  warfarin (COUMADIN) 4 MG tablet Take 4 mg by mouth daily.   Yes Historical Provider, MD    Allergies:  No Known Allergies  Social History:  Ambulatory   independently  Lives at home  With family     reports that he quit smoking about 46 years ago. His smoking use included  Cigarettes. He has a 6 pack-year smoking history. He has never used smokeless tobacco. He reports that he drinks about 8.4 ounces of alcohol per week. He reports that he does not use illicit drugs.    Family History: family history includes Coronary artery disease in an other family member; Heart attack in his maternal grandmother; Stroke in his mother.    Physical Exam: Patient Vitals for the past 24 hrs:  BP Temp Temp src Pulse Resp SpO2 Height Weight  03/30/14 2159 108/78 mmHg 98.7 F (37.1 C) Oral 113 21 96 % - -  03/30/14 2131 - - - - - - 5\' 10"  (1.778 m) -  03/30/14 2103 123/66 mmHg - - 137 20 99 % - -  03/30/14 2027 - - - - - 86 % - -  03/30/14 1949 - - - - - - - 79.833 kg (176 lb)  03/30/14 1940 116/71 mmHg 101.8 F (38.8 C) Oral 97 16 99 % - -  03/30/14 1844 116/71 mmHg 98.2 F (36.8 C) Oral 101  18 96 % - -    1. General:  in No Acute distress 2. Psychological: Alert and    Oriented 3. Head/ENT:   Moist  Mucous Membranes                          Head Non traumatic, neck supple                          Normal  Dentition 4. SKIN:   decreased Skin turgor,  Skin clean Dry and intact no rash 5. Heart: irregular rate and rhythm no Murmur, Rub or gallop 6. Lungs:  no wheezes occasional mild crackles   7. Abdomen: Soft, non-tender, Non distended 8. Lower extremities: no clubbing, cyanosis, or edema 9. Neurologically Grossly intact, moving all 4 extremities equally 10. MSK: Normal range of motion  body mass index is 25.25 kg/(m^2).   Labs on Admission:   Recent Labs  03/30/14 2015  NA 129*  K 4.3  CL 93*  CO2 23  GLUCOSE 90  BUN 27*  CREATININE 1.54*  CALCIUM 8.7    Recent Labs  03/30/14 2015  AST 24  ALT 37  ALKPHOS 54  BILITOT 0.9  PROT 5.5*  ALBUMIN 2.5*   No results found for this basename: LIPASE, AMYLASE,  in the last 72 hours  Recent Labs  03/30/14 2015  WBC 4.2  NEUTROABS 3.4  HGB 8.3*  HCT 24.3*  MCV 96.4  PLT 236    Recent Labs   03/30/14 2015  TROPONINI <0.30   No results found for this basename: TSH, T4TOTAL, FREET3, T3FREE, THYROIDAB,  in the last 72 hours No results found for this basename: VITAMINB12, FOLATE, FERRITIN, TIBC, IRON, RETICCTPCT,  in the last 72 hours No results found for this basename: HGBA1C    Estimated Creatinine Clearance: 44.1 ml/min (by C-G formula based on Cr of 1.54). ABG    Component Value Date/Time   TCO2 22 01/24/2014 0846     Lab Results  Component Value Date   DDIMER 7.83* 12/29/2013     Other results:  I have pearsonaly reviewed this: ECG REPORT  Rate:   Rhythm: a.flutter ST&T Change: no ischemia   UA RBC 7-10  BNP (last 3 results)  Recent Labs  12/18/13 1635 03/05/14 0938 03/30/14 2015  PROBNP 5296.0* 573.0* 7170.0*    Filed Weights   03/30/14 1949  Weight: 79.833 kg (176 lb)    Cultures:    Component Value Date/Time   SDES BLOOD RIGHT ARM 06/07/2010 1854   SPECREQUEST BOTTLES DRAWN AEROBIC AND ANAEROBIC 8CC 06/07/2010 1854   CULT NO GROWTH 5 DAYS 06/07/2010 1854   REPTSTATUS 06/14/2010 FINAL 06/07/2010 1854    Radiological Exams on Admission: Dg Chest 2 View  03/30/2014   CLINICAL DATA:  Several month history of shortness of breath with history of CHF.  EXAM: CHEST  2 VIEW  COMPARISON:  Portable chest x-ray of March 16, 2014  FINDINGS: The lungs are well-expanded. The interstitial markings are mildly prominent. The cardiac silhouette is top-normal in size. The pulmonary vascularity is not clearly engorged. There is no pleural effusion. The bony thorax is unremarkable.  IMPRESSION: Mildly increased interstitial markings may reflect interstitial pneumonia but mild CHF may produce similar findings. There is no classic alveolar pneumonia.   Electronically Signed   By: David  Martinique   On: 03/30/2014 18:17    Chart has been reviewed  Assessment/Plan  74 year old gentleman history of Goodpasture syndrome resulting in chronic kidney disease with ongoing  treatment with steroids and Cytoxan presents with fever and A.fib with RVR with chest x-ray significant for albuterol or pneumonia versus CHF although Goodpasture could not be excluded  Present on Admission:  . Sepsis - given fever tachycardia and somewhat soft blood pressures will observe and step down. Administer IV antibiotics and obtain ProCalcitonin. Pulmonology to see in the morning  .  Goodpasture's disease, treated with steroid, oral cytoxan and plasmapheresis   - it is unclear if current findings and chest x-ray consistent with Goodpasture syndrome. Patient denies any hemoptysis. Pulmonology see him in a.m.  . Acute on chronic diastolic HF (heart failure) in combination with rapid a flutter and anemia - chest x-ray worrisome for pulmonary edema versus pneumonia. Patient does have history of diastolic dysfunction. Overall does not appear to be fluid overloaded. Given tachycardia soft blood pressures and fever will hold off on Lasix  . CKD (chronic kidney disease) stage 3, GFR 30-59 ml/min  - secondary to Goodpasture currently stable  . Anemia of chronic disease - hold off on transfusion at this point hemoglobin stable  . Atrial fibrillation with RVR - will restart metoprolol and see if that works patient has mild A. fib with RVR. Improved after fever control . Continue Coumadin check INR  . hCAP  - will cover with cefepime and vancomycin monitor step down obtain sputum cultures  . HYPERTENSION, BENIGN - continue metoprolol holding parameters currently blood pressures somewhat soft but patient states that this is his new  baseline   Prophylaxis:  Coumadin, Protonix  CODE STATUS:  FULL CODE    Other plan as per orders.  I have spent a total of 65 min on this admission external was taken to discuss care with [place] 03/30/2014, 10:13 PM  [facility]  Pager [phone]   If 7AM-7PM, please contact the day team taking care of the patient  [email or web address]  Password  [email or web address]

## 2014-03-30 NOTE — ED Provider Notes (Signed)
Medical screening examination/treatment/procedure(s) were performed by non-physician practitioner and as supervising physician I was immediately available for consultation/collaboration.   EKG Interpretation None        Neta Ehlers, MD 03/30/14 2359

## 2014-03-30 NOTE — ED Notes (Addendum)
Patient saw a doctor at [facility] today, for increased weakness, SOB, and fatigue.  They did an EKG and a chest x-ray and sent him here.  Patient denies chest pain, nausea, vomiting.  Patient reports a fever earlier today of 102.  Temperature normal now.

## 2014-03-30 NOTE — Progress Notes (Signed)
ANTIBIOTIC CONSULT NOTE - INITIAL  Pharmacy Consult for Cefepime/Vancomycin Indication: HCAP  No Known Allergies  Patient Measurements: Weight: 176 lb (79.833 kg)   Vital Signs: Temp: 101.8 F (38.8 C) (07/03 1940) Temp src: Oral (07/03 1940) BP: 123/66 mmHg (07/03 2103) Pulse Rate: 137 (07/03 2103) Intake/Output from previous day:   Intake/Output from this shift:    Labs:  Recent Labs  03/30/14 2015  WBC 4.2  HGB 8.3*  PLT 236   The CrCl is unknown because both a height and weight (above a minimum accepted value) are required for this calculation. No results found for this basename: VANCOTROUGH, VANCOPEAK, VANCORANDOM, GENTTROUGH, GENTPEAK, GENTRANDOM, TOBRATROUGH, TOBRAPEAK, TOBRARND, AMIKACINPEAK, AMIKACINTROU, AMIKACIN,  in the last 72 hours   Microbiology: No results found for this or any previous visit (from the past 720 hour(s)).  Medical History: Past Medical History  Diagnosis Date  . Atrial flutter started in 2011  . Chronic diastolic heart failure   . DVT   . HYPERLIPIDEMIA   . HYPERTENSION, BENIGN   . ALLERGIC ASTHMA   . Palpitations   . H/O Goodpasture's syndrome     "autoimmune disease; affects kidneys and lungs"  . DVT (deep venous thrombosis) 09/2001    "right groin"  . PE (pulmonary embolism) 09/2001  . CHF (congestive heart failure)   . Anemia   . History of blood transfusion     "related to Goodpasture's syndrome; plasmapheresis process"  . Stomach ulcer   . Arthritis     "hands, knees" (03/16/2014)  . Gout   . Renal insufficiency     "related to the Goodpasture's"  . Atrial flutter with rapid ventricular response 03/16/2014  . Acute on chronic diastolic HF (heart failure) in combination with rapid a flutter and anemia 03/18/2014    Medications:  Scheduled:   Infusions:  . sodium chloride    . ceFEPime (MAXIPIME) IV 2 g (03/30/14 2105)  . vancomycin     Assessment: 67 yoM with hx of goodpastures disease and A-fib on chronic  cytoxan (oral) presents with weakness, fever and dizziness.  Vancomycin and Cefepime for HCAP.   Goal of Therapy:  Vancomycin trough level 15-20 mcg/ml  Plan:   Cefepime 2gm x1 then 1gm IV q12h   Vancomycin 1gm x1 then 750mg  IV q12h  F/U SCr/levels/cultures as needed  Dorrene German 03/30/2014,9:17 PM

## 2014-03-30 NOTE — ED Notes (Signed)
Notified RN,[place] pt. Temperature 101.8

## 2014-03-30 NOTE — ED Notes (Signed)
[place], [place] made aware of CG4 Lactic results.

## 2014-03-31 DIAGNOSIS — I5033 Acute on chronic diastolic (congestive) heart failure: Secondary | ICD-10-CM

## 2014-03-31 DIAGNOSIS — J189 Pneumonia, unspecified organism: Secondary | ICD-10-CM

## 2014-03-31 DIAGNOSIS — J9600 Acute respiratory failure, unspecified whether with hypoxia or hypercapnia: Secondary | ICD-10-CM | POA: Diagnosis not present

## 2014-03-31 DIAGNOSIS — I82409 Acute embolism and thrombosis of unspecified deep veins of unspecified lower extremity: Secondary | ICD-10-CM

## 2014-03-31 DIAGNOSIS — R0902 Hypoxemia: Secondary | ICD-10-CM

## 2014-03-31 DIAGNOSIS — A419 Sepsis, unspecified organism: Secondary | ICD-10-CM | POA: Diagnosis not present

## 2014-03-31 LAB — COMPREHENSIVE METABOLIC PANEL
ALT: 35 U/L (ref 0–53)
AST: 27 U/L (ref 0–37)
Albumin: 2.4 g/dL — ABNORMAL LOW (ref 3.5–5.2)
Alkaline Phosphatase: 52 U/L (ref 39–117)
Anion gap: 11 (ref 5–15)
BILIRUBIN TOTAL: 0.8 mg/dL (ref 0.3–1.2)
BUN: 28 mg/dL — ABNORMAL HIGH (ref 6–23)
CHLORIDE: 97 meq/L (ref 96–112)
CO2: 24 meq/L (ref 19–32)
Calcium: 8.6 mg/dL (ref 8.4–10.5)
Creatinine, Ser: 1.6 mg/dL — ABNORMAL HIGH (ref 0.50–1.35)
GFR, EST AFRICAN AMERICAN: 48 mL/min — AB (ref >90)
GFR, EST NON AFRICAN AMERICAN: 41 mL/min — AB (ref >90)
Glucose, Bld: 85 mg/dL (ref 70–99)
Potassium: 4 mEq/L (ref 3.7–5.3)
Sodium: 132 mEq/L — ABNORMAL LOW (ref 137–147)
Total Protein: 5.5 g/dL — ABNORMAL LOW (ref 6.0–8.3)

## 2014-03-31 LAB — CBC
HEMATOCRIT: 23.9 % — AB (ref 39.0–52.0)
Hemoglobin: 8.1 g/dL — ABNORMAL LOW (ref 13.0–17.0)
MCH: 33.1 pg (ref 26.0–34.0)
MCHC: 33.9 g/dL (ref 30.0–36.0)
MCV: 97.6 fL (ref 78.0–100.0)
PLATELETS: 224 10*3/uL (ref 150–400)
RBC: 2.45 MIL/uL — AB (ref 4.22–5.81)
RDW: 22.1 % — AB (ref 11.5–15.5)
WBC: 3.5 10*3/uL — AB (ref 4.0–10.5)

## 2014-03-31 LAB — TSH: TSH: 1.24 u[IU]/mL (ref 0.350–4.500)

## 2014-03-31 LAB — PROTIME-INR
INR: 1.38 (ref 0.00–1.49)
PROTHROMBIN TIME: 17 s — AB (ref 11.6–15.2)

## 2014-03-31 LAB — MRSA PCR SCREENING: MRSA by PCR: NEGATIVE

## 2014-03-31 LAB — PROCALCITONIN: PROCALCITONIN: 0.18 ng/mL

## 2014-03-31 LAB — PHOSPHORUS: Phosphorus: 3.4 mg/dL (ref 2.3–4.6)

## 2014-03-31 LAB — HEPARIN LEVEL (UNFRACTIONATED): Heparin Unfractionated: 0.14 IU/mL — ABNORMAL LOW (ref 0.30–0.70)

## 2014-03-31 LAB — MAGNESIUM: Magnesium: 1.6 mg/dL (ref 1.5–2.5)

## 2014-03-31 LAB — STREP PNEUMONIAE URINARY ANTIGEN: Strep Pneumo Urinary Antigen: NEGATIVE

## 2014-03-31 MED ORDER — DUTASTERIDE 0.5 MG PO CAPS
0.5000 mg | ORAL_CAPSULE | Freq: Every day | ORAL | Status: DC
  Administered 2014-03-31 – 2014-04-06 (×6): 0.5 mg via ORAL
  Filled 2014-03-31 (×7): qty 1

## 2014-03-31 MED ORDER — WARFARIN - PHARMACIST DOSING INPATIENT: Freq: Every day | Status: DC

## 2014-03-31 MED ORDER — SODIUM CHLORIDE 0.9 % IJ SOLN
3.0000 mL | Freq: Two times a day (BID) | INTRAMUSCULAR | Status: DC
  Administered 2014-03-31 – 2014-04-03 (×4): 3 mL via INTRAVENOUS

## 2014-03-31 MED ORDER — LATANOPROST 0.005 % OP SOLN
1.0000 [drp] | Freq: Every day | OPHTHALMIC | Status: DC
  Administered 2014-03-31 – 2014-04-06 (×7): 1 [drp] via OPHTHALMIC
  Filled 2014-03-31: qty 2.5

## 2014-03-31 MED ORDER — CYCLOPHOSPHAMIDE 50 MG PO TABS: 100.0000 mg | ORAL_TABLET | Freq: Every day | ORAL | Status: DC

## 2014-03-31 MED ORDER — PREDNISONE 20 MG PO TABS
20.0000 mg | ORAL_TABLET | Freq: Every day | ORAL | Status: DC
  Administered 2014-03-31: 20 mg via ORAL
  Filled 2014-03-31: qty 1

## 2014-03-31 MED ORDER — SODIUM CHLORIDE 0.9 % IJ SOLN
3.0000 mL | INTRAMUSCULAR | Status: DC | PRN
  Administered 2014-04-05: 3 mL via INTRAVENOUS

## 2014-03-31 MED ORDER — WARFARIN SODIUM 6 MG PO TABS
6.0000 mg | ORAL_TABLET | Freq: Once | ORAL | Status: AC
  Administered 2014-03-31: 6 mg via ORAL
  Filled 2014-03-31: qty 1

## 2014-03-31 MED ORDER — MONTELUKAST SODIUM 10 MG PO TABS
10.0000 mg | ORAL_TABLET | Freq: Every day | ORAL | Status: DC
  Administered 2014-03-31 – 2014-04-05 (×7): 10 mg via ORAL
  Filled 2014-03-31 (×8): qty 1

## 2014-03-31 MED ORDER — ONDANSETRON HCL 4 MG/2ML IJ SOLN: 4.0000 mg | Freq: Four times a day (QID) | INTRAMUSCULAR | Status: DC | PRN

## 2014-03-31 MED ORDER — LEVOFLOXACIN IN D5W 750 MG/150ML IV SOLN
750.0000 mg | INTRAVENOUS | Status: DC
  Administered 2014-03-31 – 2014-04-04 (×3): 750 mg via INTRAVENOUS
  Filled 2014-03-31 (×3): qty 150

## 2014-03-31 MED ORDER — PREDNISONE 20 MG PO TABS
60.0000 mg | ORAL_TABLET | Freq: Every day | ORAL | Status: DC
  Administered 2014-04-01: 60 mg via ORAL
  Filled 2014-03-31 (×2): qty 3

## 2014-03-31 MED ORDER — ACETAMINOPHEN 650 MG RE SUPP: 650.0000 mg | Freq: Four times a day (QID) | RECTAL | Status: DC | PRN

## 2014-03-31 MED ORDER — HEPARIN (PORCINE) IN NACL 100-0.45 UNIT/ML-% IJ SOLN
1200.0000 [IU]/h | INTRAMUSCULAR | Status: DC
  Administered 2014-03-31: 1200 [IU]/h via INTRAVENOUS
  Filled 2014-03-31: qty 250

## 2014-03-31 MED ORDER — IPRATROPIUM BROMIDE 0.02 % IN SOLN: 0.5000 mg | Freq: Four times a day (QID) | RESPIRATORY_TRACT | Status: DC

## 2014-03-31 MED ORDER — DUTASTERIDE-TAMSULOSIN HCL 0.5-0.4 MG PO CAPS: 1.0000 | ORAL_CAPSULE | Freq: Every day | ORAL | Status: DC

## 2014-03-31 MED ORDER — PANTOPRAZOLE SODIUM 40 MG PO TBEC
40.0000 mg | DELAYED_RELEASE_TABLET | Freq: Every day | ORAL | Status: DC
  Administered 2014-03-31 – 2014-04-06 (×7): 40 mg via ORAL
  Filled 2014-03-31 (×7): qty 1

## 2014-03-31 MED ORDER — HEPARIN (PORCINE) IN NACL 100-0.45 UNIT/ML-% IJ SOLN
1400.0000 [IU]/h | INTRAMUSCULAR | Status: DC
  Filled 2014-03-31: qty 250

## 2014-03-31 MED ORDER — TRAMADOL HCL 50 MG PO TABS
50.0000 mg | ORAL_TABLET | Freq: Two times a day (BID) | ORAL | Status: DC | PRN
  Administered 2014-03-31 – 2014-04-01 (×3): 50 mg via ORAL
  Filled 2014-03-31 (×3): qty 1

## 2014-03-31 MED ORDER — TAMSULOSIN HCL 0.4 MG PO CAPS
0.4000 mg | ORAL_CAPSULE | Freq: Every day | ORAL | Status: DC
  Administered 2014-03-31 – 2014-04-06 (×7): 0.4 mg via ORAL
  Filled 2014-03-31 (×7): qty 1

## 2014-03-31 MED ORDER — ONDANSETRON HCL 4 MG PO TABS: 4.0000 mg | ORAL_TABLET | Freq: Four times a day (QID) | ORAL | Status: DC | PRN

## 2014-03-31 MED ORDER — HYDROCODONE-ACETAMINOPHEN 5-325 MG PO TABS: 1.0000 | ORAL_TABLET | ORAL | Status: DC | PRN

## 2014-03-31 MED ORDER — SODIUM CHLORIDE 0.9 % IV SOLN: 250.0000 mL | INTRAVENOUS | Status: DC | PRN

## 2014-03-31 MED ORDER — SODIUM CHLORIDE 0.9 % IJ SOLN
3.0000 mL | Freq: Two times a day (BID) | INTRAMUSCULAR | Status: DC
  Administered 2014-03-31 – 2014-04-06 (×8): 3 mL via INTRAVENOUS

## 2014-03-31 MED ORDER — METOPROLOL TARTRATE 50 MG PO TABS
50.0000 mg | ORAL_TABLET | Freq: Two times a day (BID) | ORAL | Status: DC
  Administered 2014-03-31 – 2014-04-06 (×12): 50 mg via ORAL
  Filled 2014-03-31 (×3): qty 2
  Filled 2014-03-31: qty 1
  Filled 2014-03-31 (×2): qty 2
  Filled 2014-03-31 (×3): qty 1
  Filled 2014-03-31 (×3): qty 2
  Filled 2014-03-31 (×3): qty 1

## 2014-03-31 MED ORDER — LEVALBUTEROL HCL 0.63 MG/3ML IN NEBU: 0.6300 mg | INHALATION_SOLUTION | Freq: Four times a day (QID) | RESPIRATORY_TRACT | Status: DC | PRN

## 2014-03-31 MED ORDER — DEXTROSE 5 % IV SOLN
5.0000 mg/h | INTRAVENOUS | Status: DC
  Administered 2014-03-31 – 2014-04-01 (×2): 5 mg/h via INTRAVENOUS
  Filled 2014-03-31: qty 100

## 2014-03-31 MED ORDER — CYCLOBENZAPRINE HCL 5 MG PO TABS
5.0000 mg | ORAL_TABLET | Freq: Every day | ORAL | Status: DC
  Administered 2014-03-31: 02:00:00 via ORAL
  Administered 2014-03-31 – 2014-04-05 (×6): 5 mg via ORAL
  Filled 2014-03-31 (×8): qty 1

## 2014-03-31 MED ORDER — ACETAMINOPHEN 325 MG PO TABS: 650.0000 mg | ORAL_TABLET | Freq: Four times a day (QID) | ORAL | Status: DC | PRN

## 2014-03-31 MED ORDER — IPRATROPIUM BROMIDE 0.02 % IN SOLN
0.5000 mg | Freq: Four times a day (QID) | RESPIRATORY_TRACT | Status: DC | PRN
Start: 2014-03-31 — End: 2014-04-06

## 2014-03-31 MED ORDER — DOCUSATE SODIUM 100 MG PO CAPS
100.0000 mg | ORAL_CAPSULE | Freq: Two times a day (BID) | ORAL | Status: DC
  Administered 2014-03-31 – 2014-04-05 (×10): 100 mg via ORAL
  Filled 2014-03-31 (×16): qty 1

## 2014-03-31 NOTE — Progress Notes (Signed)
ANTICOAGULATION CONSULT NOTE - Initial Consult  Pharmacy Consult for Heparin Indication: atrial fibrillation/h/o PE  No Known Allergies  Patient Measurements: Height: 5\' 10"  (177.8 cm) Weight: 176 lb (79.833 kg) IBW/kg (Calculated) : 73 Heparin Dosing Weight: 79. 8 kg  Vital Signs: Temp: 98 F (36.7 C) (07/04 1600) Temp src: Oral (07/04 1600) BP: 128/76 mmHg (07/04 2131) Pulse Rate: 126 (07/04 2131)  Labs:  Recent Labs  03/30/14 2015 03/31/14 0100 03/31/14 0212 03/31/14 2100  HGB 8.3*  --  8.1*  --   HCT 24.3*  --  23.9*  --   PLT 236  --  224  --   LABPROT  --  17.0*  --   --   INR  --  1.38  --   --   HEPARINUNFRC  --   --   --  0.14*  CREATININE 1.54*  --  1.60*  --   TROPONINI <0.30  --   --   --     Estimated Creatinine Clearance: 42.5 ml/min (by C-G formula based on Cr of 1.6).   Medical History: Past Medical History  Diagnosis Date  . Atrial flutter started in 2011  . Chronic diastolic heart failure   . DVT   . HYPERLIPIDEMIA   . HYPERTENSION, BENIGN   . ALLERGIC ASTHMA   . Palpitations   . H/O Goodpasture's syndrome     "autoimmune disease; affects kidneys and lungs"  . DVT (deep venous thrombosis) 09/2001    "right groin"  . PE (pulmonary embolism) 09/2001  . CHF (congestive heart failure)   . Anemia   . History of blood transfusion     "related to Goodpasture's syndrome; plasmapheresis process"  . Stomach ulcer   . Arthritis     "hands, knees" (03/16/2014)  . Gout   . Renal insufficiency     "related to the Goodpasture's"  . Atrial flutter with rapid ventricular response 03/16/2014  . Acute on chronic diastolic HF (heart failure) in combination with rapid a flutter and anemia 03/18/2014    Medications:  Scheduled:  . ceFEPime (MAXIPIME) IV  1 g Intravenous Q12H  . cyclobenzaprine  5 mg Oral QHS  . docusate sodium  100 mg Oral BID  . dutasteride  0.5 mg Oral Daily   And  . tamsulosin  0.4 mg Oral Daily  . latanoprost  1 drop Both Eyes  Daily  . levofloxacin (LEVAQUIN) IV  750 mg Intravenous Q48H  . metoprolol  50 mg Oral BID  . montelukast  10 mg Oral QHS  . pantoprazole  40 mg Oral Daily  . [START ON 04/01/2014] predniSONE  60 mg Oral Q breakfast  . sodium chloride  3 mL Intravenous Q12H  . sodium chloride  3 mL Intravenous Q12H  . vancomycin  750 mg Intravenous Q12H  . Warfarin - Pharmacist Dosing Inpatient   Does not apply q1800   Infusions:  . diltiazem (CARDIZEM) infusion 5 mg/hr (03/31/14 1312)  . heparin 1,200 Units/hr (03/31/14 1317)   PRN: sodium chloride, acetaminophen, acetaminophen, HYDROcodone-acetaminophen, ipratropium, levalbuterol, ondansetron (ZOFRAN) IV, ondansetron, sodium chloride, traMADol  Assessment: . Pt on coumadin PTA for AFib/h/o PE. INR=1.38 and heparin to be started until INR therapeutic.   1st heparin level SUBtherapeutic (0.14) on a rate of 1200 units/hr  No complications of therapy noted  Goal of Therapy:  Heparin level 0.3-0.7 units/ml Monitor PLT while on Heparin: Yes   Plan:  . Increase Heparin drip to 1400 units/hr . Check 8 hr  HL then daily HL/CBC thereafter  Michaelene Dutan, [place]io [place], PharmD 03/31/2014,9:45 PM

## 2014-03-31 NOTE — Progress Notes (Signed)
PROGRESS NOTE  Bryan Hubbard YSA:[id number] DOB: 1940/12/08 DOA: 03/30/2014 PCP: [name], MD  HPI: Bryan Hubbard is a 73 y.o. male with A flutter, recently diagnosed Goodpasture sd admitted with fevers, shortness f breath, A flutter with RVR  Assessment/Plan: Acute hypoxic respiratory failure - due to ?HCAP vs interstitial lung disease, oxygen as needed, continue supportive treatment. Pulmonology consulted, appreciate input.  HCAP - with fever this certainly possible, some atypical appearance on the chest CT, add Levofloxacin today. Cultures pending.  A flutter with RVR - was on Metoprolol 75 BID but had side effects as an outpatient and his dose was decreased to 50 BID. His rates this morning in the 130-140s, he is asymptomatic, no chest pain or palpitations. Likely exacerbated by #1 and #2 but outpatient medication recently changed. Cardiology consulted, appreciate input.  - continue Coumadin, however INR sub therapeutic on admission. CKD III - Cr at baseline, good UOP. Avoid nephrotoxins.  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  Anemia - of chronic disease  Diet: low sodium Fluids: none DVT Prophylaxis: Coumadin  Code Status: Full Family Communication: d/w wife  Disposition Plan: inpatient  Consultants:  Cardiology   PCCM  Procedures:  None    Antibiotics Vancomycin 7/3 >> Cefepime 7/3 >> Levofloxacin 7/4 >>  HPI/Subjective: Feeling better, dyspnea better, no chest pain  Objective: Filed Vitals:   03/30/14 2340 03/31/14 0200 03/31/14 0400 03/31/14 0600  BP: 109/65 126/86 120/75 109/72  Pulse: 103 126 115 117  Temp: 98.9 F (37.2 C)  100 F (37.8 C)   TempSrc: Oral  Oral   Resp: 20 19 24 23   Height:      Weight:      SpO2: 96% 96% 96% 92%    Intake/Output Summary (Last 24 hours) at 03/31/14 0745 Last data filed at 03/31/14 0600  Gross per 24 hour  Intake   2644 ml  Output   1075 ml  Net   1569 ml    Filed Weights   03/30/14 1949  Weight: 79.833 kg (176 lb)    Exam:  General:  NAD  Cardiovascular: irregular, tachycardic  Respiratory: diffuse rales, no wheezing, moves air well   Abdomen: soft, not tender to palpation, positive bowel sounds  MSK: no peripheral edema  Neuro: non focal   Data Reviewed: Basic Metabolic Panel:  Recent Labs Lab 03/30/14 2015 03/31/14 0212  NA 129* 132*  K 4.3 4.0  CL 93* 97  CO2 23 24  GLUCOSE 90 85  BUN 27* 28*  CREATININE 1.54* 1.60*  CALCIUM 8.7 8.6  MG  --  1.6  PHOS  --  3.4   Liver Function Tests:  Recent Labs Lab 03/30/14 2015 03/31/14 0212  AST 24 27  ALT 37 35  ALKPHOS 54 52  BILITOT 0.9 0.8  PROT 5.5* 5.5*  ALBUMIN 2.5* 2.4*   CBC:  Recent Labs Lab 03/30/14 2015 03/31/14 0212  WBC 4.2 3.5*  NEUTROABS 3.4  --   HGB 8.3* 8.1*  HCT 24.3* 23.9*  MCV 96.4 97.6  PLT 236 224   Cardiac Enzymes:  Recent Labs Lab 03/30/14 2015  TROPONINI <0.30   BNP (last 3 results)  Recent Labs  12/18/13 1635 03/05/14 0938 03/30/14 2015  PROBNP 5296.0* 573.0* 7170.0*   Recent Results (from the past 240 hour(s))  MRSA PCR SCREENING     Status: None   Collection Time    03/31/14 12:49 AM      Result Value  Ref Range Status   MRSA by PCR NEGATIVE  NEGATIVE Final   Comment:            The GeneXpert MRSA Assay (FDA     approved for NASAL specimens     only), is one component of a     comprehensive MRSA colonization     surveillance program. It is not     intended to diagnose MRSA     infection nor to guide or     monitor treatment for     MRSA infections.     Studies: Dg Chest 2 View  03/30/2014   CLINICAL DATA:  Several month history of shortness of breath with history of CHF.  EXAM: CHEST  2 VIEW  COMPARISON:  Portable chest x-ray of March 16, 2014  FINDINGS: The lungs are well-expanded. The interstitial markings are mildly prominent. The cardiac silhouette is top-normal in size. The pulmonary vascularity  is not clearly engorged. There is no pleural effusion. The bony thorax is unremarkable.  IMPRESSION: Mildly increased interstitial markings may reflect interstitial pneumonia but mild CHF may produce similar findings. There is no classic alveolar pneumonia.   Electronically Signed   By: David  Martinique   On: 03/30/2014 18:17   Ct Chest Wo Contrast  03/30/2014   CLINICAL DATA:  Shortness of breath  EXAM: CT CHEST WITHOUT CONTRAST  TECHNIQUE: Multidetector CT imaging of the chest was performed following the standard protocol without IV contrast.  COMPARISON:  Recent chest radiography, including 03/16/2014  FINDINGS: THORACIC INLET/BODY WALL:  No acute abnormality.  MEDIASTINUM:  No cardiomegaly. Multi focal coronary artery atherosclerosis. Small volume of fat along the right side of the interventricular septum which could be from remote infarct. No pericardial effusion. Prominent mediastinal lymph nodes, some containing coarse calcification. Right hilar nodes are also calcified. The largest node is in the sub- carina region measuring 16 mm short axis. No acute vascular findings. The main pulmonary artery appears normal, but the right pulmonary artery appears mildly dilated, equivocal for pulmonary hypertension.  LUNG WINDOWS:  There is diffuse but mildly basilar predominant subpleural reticulation. Lung volumes remain normal. No consolidation or suspicious nodularity. Fissures are mildly distorted, suggesting fibrotic changes, but no honeycombing noted. No pleural effusion.  UPPER ABDOMEN:  No acute findings.  OSSEOUS:  No acute fracture.  No suspicious lytic or blastic lesions.  IMPRESSION: Diffuse interstitial lung abnormality. While an acute atypical infection is a diagnostic possibility, there has been persistent interstitial abnormality over multiple months by chest radiography. 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 The  current abnormality is not typical of alveolar hemorrhage, but recommend correlation with chest CT imaging from time of diagnosis.   Electronically Signed   By: Jorje Guild M.D.   On: 03/30/2014 23:33    Scheduled Meds: . ceFEPime (MAXIPIME) IV  1 g Intravenous Q12H  . cyclobenzaprine  5 mg Oral QHS  . docusate sodium  100 mg Oral BID  . dutasteride  0.5 mg Oral Daily   And  . tamsulosin  0.4 mg Oral Daily  . latanoprost  1 drop Both Eyes Daily  . metoprolol  50 mg Oral BID  . montelukast  10 mg Oral QHS  . pantoprazole  40 mg Oral Daily  . predniSONE  20 mg Oral Q breakfast  . sodium chloride  3 mL Intravenous Q12H  . sodium chloride  3 mL Intravenous Q12H  . vancomycin  750  mg Intravenous Q12H  . Warfarin - Pharmacist Dosing Inpatient   Does not apply q1800   Continuous Infusions:   Active Problems:   HYPERTENSION, BENIGN   CAP (community acquired pneumonia)   Anemia of chronic disease    Goodpasture's disease, treated with steroid, oral cytoxan and plasmapheresis     Acute on chronic diastolic HF (heart failure) in combination with rapid a flutter and anemia   CKD (chronic kidney disease) stage 3, GFR 30-59 ml/min   Atrial fibrillation with RVR   Sepsis   Time spent: 35  This note has been created with Surveyor, quantity. Any transcriptional errors are unintentional.   Marzetta Board, MD [facility] Pager [phone]. If 7 PM - 7 AM, please contact night-coverage at [email or web address], password [email or web address] 03/31/2014, 7:45 AM  LOS: 1 day

## 2014-03-31 NOTE — Progress Notes (Signed)
ANTIBIOTIC CONSULT NOTE - INITIAL  Pharmacy Consult for Levaquin Indication: HCAP  No Known Allergies  Patient Measurements: Height: 5\' 10"  (177.8 cm) Weight: 176 lb (79.833 kg) IBW/kg (Calculated) : 73   Vital Signs: Temp: 100 F (37.8 C) (07/04 0400) Temp src: Oral (07/04 0400) BP: 109/72 mmHg (07/04 0600) Pulse Rate: 117 (07/04 0600) Intake/Output from previous day: 07/03 0701 - 07/04 0700 In: 2644 [I.V.:2644] Out: 1075 [Urine:1075] Intake/Output from this shift:    Labs:  Recent Labs  03/30/14 2015 03/31/14 0212  WBC 4.2 3.5*  HGB 8.3* 8.1*  PLT 236 224  CREATININE 1.54* 1.60*   Estimated Creatinine Clearance: 42.5 ml/min (by C-G formula based on Cr of 1.6). No results found for this basename: VANCOTROUGH, Corlis Leak, VANCORANDOM, GENTTROUGH, GENTPEAK, GENTRANDOM, TOBRATROUGH, TOBRAPEAK, TOBRARND, AMIKACINPEAK, AMIKACINTROU, AMIKACIN,  in the last 72 hours   Microbiology: Recent Results (from the past 720 hour(s))  MRSA PCR SCREENING     Status: None   Collection Time    03/31/14 12:49 AM      Result Value Ref Range Status   MRSA by PCR NEGATIVE  NEGATIVE Final   Comment:            The GeneXpert MRSA Assay (FDA     approved for NASAL specimens     only), is one component of a     comprehensive MRSA colonization     surveillance program. It is not     intended to diagnose MRSA     infection nor to guide or     monitor treatment for     MRSA infections.    Medical History: Past Medical History  Diagnosis Date  . Atrial flutter started in 2011  . Chronic diastolic heart failure   . DVT   . HYPERLIPIDEMIA   . HYPERTENSION, BENIGN   . ALLERGIC ASTHMA   . Palpitations   . H/O Goodpasture's syndrome     "autoimmune disease; affects kidneys and lungs"  . DVT (deep venous thrombosis) 09/2001    "right groin"  . PE (pulmonary embolism) 09/2001  . CHF (congestive heart failure)   . Anemia   . History of blood transfusion     "related to  Goodpasture's syndrome; plasmapheresis process"  . Stomach ulcer   . Arthritis     "hands, knees" (03/16/2014)  . Gout   . Renal insufficiency     "related to the Goodpasture's"  . Atrial flutter with rapid ventricular response 03/16/2014  . Acute on chronic diastolic HF (heart failure) in combination with rapid a flutter and anemia 03/18/2014    Medications:  Scheduled:  . ceFEPime (MAXIPIME) IV  1 g Intravenous Q12H  . cyclobenzaprine  5 mg Oral QHS  . docusate sodium  100 mg Oral BID  . dutasteride  0.5 mg Oral Daily   And  . tamsulosin  0.4 mg Oral Daily  . latanoprost  1 drop Both Eyes Daily  . levofloxacin (LEVAQUIN) IV  750 mg Intravenous Q48H  . metoprolol  50 mg Oral BID  . montelukast  10 mg Oral QHS  . pantoprazole  40 mg Oral Daily  . predniSONE  20 mg Oral Q breakfast  . sodium chloride  3 mL Intravenous Q12H  . sodium chloride  3 mL Intravenous Q12H  . vancomycin  750 mg Intravenous Q12H  . Warfarin - Pharmacist Dosing Inpatient   Does not apply q1800   Infusions:    Assessment: 1 yoM with hx of goodpastures disease  and A-fib on chronic cytoxan (oral) presents with weakness, fever and dizziness.  Vancomycin and Cefepime started for for HCAP.  Today, MD adding Levaquin to regimen.  7/3 >> cefepime >> 7/3 >> vancomycin >>  7/4 >> Levaquin >>  Tmax: 100 WBCs: low Renal: SCr 1.60 (increased), CrCl~45 ml/min (CG)  7/3 blood: sent 7/3 urine: sent 7/4 MRSA screen neg  Goal of Therapy:  Vancomycin trough level 15-20 mcg/ml  Plan:   Add Levaquin 750 mg IV q24h.   Continue Cefepime 1gm IV q12h   Continue Vancomycin 750mg  IV q12h  F/U SCr, levels, cultures.  Hershal Coria 03/31/2014,7:47 AM

## 2014-03-31 NOTE — Progress Notes (Signed)
ANTICOAGULATION CONSULT NOTE - Initial Consult  Pharmacy Consult for Warfarin Indication: atrial fibrillation  No Known Allergies  Patient Measurements: Height: 5\' 10"  (177.8 cm) Weight: 176 lb (79.833 kg) IBW/kg (Calculated) : 73  Vital Signs: Temp: 98.9 F (37.2 C) (07/03 2340) Temp src: Oral (07/03 2340) BP: 109/65 mmHg (07/03 2340) Pulse Rate: 103 (07/03 2340)  Labs:  Recent Labs  03/30/14 2015 03/31/14 0100 03/31/14 0212  HGB 8.3*  --  8.1*  HCT 24.3*  --  23.9*  PLT 236  --  224  LABPROT  --  17.0*  --   INR  --  1.38  --   CREATININE 1.54*  --  1.60*  TROPONINI <0.30  --   --     Estimated Creatinine Clearance: 42.5 ml/min (by C-G formula based on Cr of 1.6).   Medical History: Past Medical History  Diagnosis Date  . Atrial flutter started in 2011  . Chronic diastolic heart failure   . DVT   . HYPERLIPIDEMIA   . HYPERTENSION, BENIGN   . ALLERGIC ASTHMA   . Palpitations   . H/O Goodpasture's syndrome     "autoimmune disease; affects kidneys and lungs"  . DVT (deep venous thrombosis) 09/2001    "right groin"  . PE (pulmonary embolism) 09/2001  . CHF (congestive heart failure)   . Anemia   . History of blood transfusion     "related to Goodpasture's syndrome; plasmapheresis process"  . Stomach ulcer   . Arthritis     "hands, knees" (03/16/2014)  . Gout   . Renal insufficiency     "related to the Goodpasture's"  . Atrial flutter with rapid ventricular response 03/16/2014  . Acute on chronic diastolic HF (heart failure) in combination with rapid a flutter and anemia 03/18/2014    Medications:  Scheduled:  . ceFEPime (MAXIPIME) IV  1 g Intravenous Q12H  . cyclobenzaprine  5 mg Oral QHS  . docusate sodium  100 mg Oral BID  . dutasteride  0.5 mg Oral Daily   And  . tamsulosin  0.4 mg Oral Daily  . latanoprost  1 drop Both Eyes Daily  . metoprolol  50 mg Oral BID  . montelukast  10 mg Oral QHS  . pantoprazole  40 mg Oral Daily  . predniSONE  20  mg Oral Q breakfast  . sodium chloride  3 mL Intravenous Q12H  . sodium chloride  3 mL Intravenous Q12H  . vancomycin  750 mg Intravenous Q12H  . Warfarin - Pharmacist Dosing Inpatient   Does not apply q1800   Infusions:    Assessment:  73 yr male with significant PMH including AFib/Aflutter, h/o DVT + PE (Jan 2003), IVC filter placed and Goodpasture's syndrome presents with fever  To be admitted for r/o sepsis, HCAP (pharmacy dosing vanc/cefepime)  PTA patient on warfarin 4.5mg  daily with last dose taken per patient on 7/2.  INR drawn this AM = 1.38 (subtherapeutic)  Pharmacy was asked to continue dosing of warfarin upon admission  Goal of Therapy:  INR 2-3   Plan:   Warfarin 6mg  po x 1 dose (will give dose this AM since no warf taken on 7/3)  Follow daily PT/INR  Takyra Cantrall, Toribio Harbour, PharmD 03/31/2014,3:13 AM

## 2014-03-31 NOTE — Progress Notes (Signed)
HR has been mostly in the 90s since card gtt started.  Rate does jump to 130s with standing to use urinal but quickly goes back to 90s after getting back to bed

## 2014-03-31 NOTE — Progress Notes (Signed)
ANTICOAGULATION CONSULT NOTE - Initial Consult  Pharmacy Consult for Heparin Indication: atrial fibrillation/h/o PE  No Known Allergies  Patient Measurements: Height: 5\' 10"  (177.8 cm) Weight: 176 lb (79.833 kg) IBW/kg (Calculated) : 73 Heparin Dosing Weight: 79. 8 kg  Vital Signs: Temp: 99.1 F (37.3 C) (07/04 0800) Temp src: Oral (07/04 0800) BP: 109/72 mmHg (07/04 0600) Pulse Rate: 117 (07/04 0600)  Labs:  Recent Labs  03/30/14 2015 03/31/14 0100 03/31/14 0212  HGB 8.3*  --  8.1*  HCT 24.3*  --  23.9*  PLT 236  --  224  LABPROT  --  17.0*  --   INR  --  1.38  --   CREATININE 1.54*  --  1.60*  TROPONINI <0.30  --   --     Estimated Creatinine Clearance: 42.5 ml/min (by C-G formula based on Cr of 1.6).   Medical History: Past Medical History  Diagnosis Date  . Atrial flutter started in 2011  . Chronic diastolic heart failure   . DVT   . HYPERLIPIDEMIA   . HYPERTENSION, BENIGN   . ALLERGIC ASTHMA   . Palpitations   . H/O Goodpasture's syndrome     "autoimmune disease; affects kidneys and lungs"  . DVT (deep venous thrombosis) 09/2001    "right groin"  . PE (pulmonary embolism) 09/2001  . CHF (congestive heart failure)   . Anemia   . History of blood transfusion     "related to Goodpasture's syndrome; plasmapheresis process"  . Stomach ulcer   . Arthritis     "hands, knees" (03/16/2014)  . Gout   . Renal insufficiency     "related to the Goodpasture's"  . Atrial flutter with rapid ventricular response 03/16/2014  . Acute on chronic diastolic HF (heart failure) in combination with rapid a flutter and anemia 03/18/2014    Medications:  Scheduled:  . ceFEPime (MAXIPIME) IV  1 g Intravenous Q12H  . cyclobenzaprine  5 mg Oral QHS  . docusate sodium  100 mg Oral BID  . dutasteride  0.5 mg Oral Daily   And  . tamsulosin  0.4 mg Oral Daily  . latanoprost  1 drop Both Eyes Daily  . levofloxacin (LEVAQUIN) IV  750 mg Intravenous Q48H  . metoprolol  50 mg  Oral BID  . montelukast  10 mg Oral QHS  . pantoprazole  40 mg Oral Daily  . [START ON 04/01/2014] predniSONE  60 mg Oral Q breakfast  . sodium chloride  3 mL Intravenous Q12H  . sodium chloride  3 mL Intravenous Q12H  . vancomycin  750 mg Intravenous Q12H  . Warfarin - Pharmacist Dosing Inpatient   Does not apply q1800   Infusions:   PRN: sodium chloride, acetaminophen, acetaminophen, HYDROcodone-acetaminophen, ipratropium, levalbuterol, ondansetron (ZOFRAN) IV, ondansetron, sodium chloride, traMADol  Assessment: . Pt on coumadin PTA for AFib/h/o PE. INR=1.38 and heparin to be started until INR therapeutic.   Goal of Therapy:  Heparin level 0.3-0.7 units/ml Monitor PLT while on Heparin: Yes   Plan:  . No heparin bolus . Heparin drip @ 1200 units/hr . Check 8 hr HL then daily HL/CBC thereafter  Garnet Sierras 03/31/2014,12:22 PM

## 2014-03-31 NOTE — Progress Notes (Signed)
eLink Physician-Brief Progress Note Patient Name: Bryan Hubbard DOB: [birth year] MRN: [id number]  Date of Service  03/31/2014   HPI/Events of Note  ELINK ATTENDING NOTE: 60 M with h/o of Goodpastures with renal insufficiency on cytoxan/prednisone.  Presented with fever and diffuse infiltrate on CXR.  Covered with ABX.  Is currently tachy with HR in the 130s but HD stable with BP of 115/74 (87).  Plan is for ABX coverage, uncontasted CT to exam lung fields and pulm consult in AM.   eICU Interventions  Camera check: Patient is currently alert in NAD lying in bed.  HD stable on [place] O2      DETERDING,ELIZABETH 03/31/2014, 12:40 AM

## 2014-03-31 NOTE — Consult Note (Signed)
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  Symptoms became acutely worse on the day of admission.  Also reports lightheadedness without syncope.  Also reports fever of 102 today. There was no sputum production or hemoptysis.  There were no GI or GU symptoms.  PAST MEDICAL HISTORY :  Past Medical History  Diagnosis Date  . Atrial flutter started in 2011  . Chronic diastolic heart failure   . DVT   . HYPERLIPIDEMIA   . HYPERTENSION, BENIGN   . ALLERGIC ASTHMA   . Palpitations   . H/O Goodpasture's syndrome     "autoimmune disease; affects kidneys and lungs"  . DVT (deep venous thrombosis) 09/2001    "right groin"  . PE (pulmonary embolism) 09/2001  . CHF (congestive heart failure)   . Anemia   . History of blood transfusion     "related to Goodpasture's syndrome; plasmapheresis process"  . Stomach ulcer   . Arthritis     "hands, knees" (03/16/2014)  . Gout   . Renal insufficiency     "related to the Goodpasture's"  . Atrial flutter with rapid ventricular response 03/16/2014  . Acute on chronic diastolic HF (heart failure) in combination with rapid a flutter and anemia 03/18/2014   Past Surgical History  Procedure Laterality Date  .  Renal biopsy, percutaneous  12/2013  . Vena cava filter placement  09/2001   Prior to Admission medications   Medication Sig Start Date End Date Taking? Authorizing Provider  bimatoprost (LUMIGAN) 0.01 % SOLN Place 1 drop into both eyes every morning.   Yes Historical Provider, MD  cyanocobalamin (,VITAMIN B-12,) 1000 MCG/ML injection Inject 1,000 mcg into the muscle every 30 (thirty) days.    Yes Historical Provider, MD  cyclobenzaprine (FLEXERIL) 5 MG tablet Take 5 mg by mouth at bedtime.   Yes Historical Provider, MD  cyclophosphamide (CYTOXAN) 50 MG tablet Take 100 mg by mouth daily. Give on an empty stomach 1 hour before or 2 hours after meals.   Yes Historical Provider, MD  Dutasteride-Tamsulosin HCl 0.5-0.4 MG CAPS Take 1 capsule by mouth daily.   Yes Historical Provider, MD  furosemide (LASIX) 20 MG tablet Take 1 tablet (20 mg total) by mouth daily. 12/30/13  Yes Belkys A Regalado, MD  metoprolol (LOPRESSOR) 50 MG tablet Take 50 mg by mouth 2 (two) times daily. 03/18/14  Yes Cecilie Kicks, NP  montelukast (SINGULAIR) 10 MG tablet Take 10 mg by mouth at bedtime.  06/10/13  Yes Historical Provider, MD  omeprazole (PRILOSEC) 20 MG capsule Take 20 mg by mouth daily.  08/17/13  Yes Historical Provider, MD  predniSONE (DELTASONE) 10 MG tablet Take 20 mg by mouth daily with breakfast.    Yes Historical Provider, MD  traMADol (ULTRAM) 50 MG tablet Take 1  tablet (50 mg total) by mouth every 12 (twelve) hours as needed. 12/30/13  Yes Belkys A Regalado, MD  Vitamin D, Cholecalciferol, 1000 UNITS TABS Take 2,000 Units by mouth daily.    Yes Historical Provider, MD  warfarin (COUMADIN) 1 MG tablet Take 0.5 mg by mouth daily.   Yes Historical Provider, MD  warfarin (COUMADIN) 4 MG tablet Take 4 mg by mouth daily.   Yes Historical Provider, MD   No Known Allergies  FAMILY HISTORY:  Family History  Problem Relation Age of Onset  . Stroke Mother   . Heart attack Maternal Grandmother   . Coronary artery  disease      negative history of early CAD   SOCIAL HISTORY:  reports that he quit smoking about 46 years ago. His smoking use included Cigarettes. He has a 6 pack-year smoking history. He has never used smokeless tobacco. He reports that he drinks about 8.4 ounces of alcohol per week. He reports that he does not use illicit drugs.  REVIEW OF SYSTEMS:  As in HPI  INTERVAL HISTORY:  VITAL SIGNS: Temp:  [98.2 F (36.8 C)-101.8 F (38.8 C)] 99.1 F (37.3 C) (07/04 0800) Pulse Rate:  [97-137] 117 (07/04 0600) Resp:  [16-28] 23 (07/04 0600) BP: (106-128)/(65-86) 109/72 mmHg (07/04 0600) SpO2:  [86 %-99 %] 92 % (07/04 0600) Weight:  [79.833 kg (176 lb)] 79.833 kg (176 lb) (07/03 1949)  HEMODYNAMICS:   VENTILATOR SETTINGS:   INTAKE / OUTPUT: Intake/Output     07/03 0701 - 07/04 0700 07/04 0701 - 07/05 0700   P.O.  240   I.V. (mL/kg) 2644 (33.1)    IV Piggyback  50   Total Intake(mL/kg) 2644 (33.1) 290 (3.6)   Urine (mL/kg/hr) 1075 300 (0.8)   Total Output 1075 300   Net +1569 -10         PHYSICAL EXAMINATION: General:  No distress Neuro:  Awake, alert, cooperative HEENT:  PERRL Cardiovascular:  RRR, no m/r/g Lungs:  Bilateral diminished air entry, few rales Abdomen:  Soft, nontender, bowel sounds diminished Musculoskeletal:  Moves all extremities, no edema Skin:  Intact  LABS: CBC  Recent Labs Lab 03/30/14 2015 03/31/14 0212  WBC 4.2 3.5*  HGB 8.3* 8.1*  HCT 24.3* 23.9*  PLT 236 224   Coag's  Recent Labs Lab 03/31/14 0100  INR 1.38   BMET  Recent Labs Lab 03/30/14 2015 03/31/14 0212  NA 129* 132*  K 4.3 4.0  CL 93* 97  CO2 23 24  BUN 27* 28*  CREATININE 1.54* 1.60*  GLUCOSE 90 85   Electrolytes  Recent Labs Lab 03/30/14 2015 03/31/14 0212  CALCIUM 8.7 8.6  MG  --  1.6  PHOS  --  3.4   Sepsis Markers  Recent Labs Lab 03/30/14 2043 03/31/14 0047  LATICACIDVEN 0.84  --   PROCALCITON  --  0.18   ABG No results found for this  basename: PHART, PCO2ART, PO2ART,  in the last 168 hours  Liver Enzymes  Recent Labs Lab 03/30/14 2015 03/31/14 0212  AST 24 27  ALT 37 35  ALKPHOS 54 52  BILITOT 0.9 0.8  ALBUMIN 2.5* 2.4*   Cardiac Enzymes  Recent Labs Lab 03/30/14 2015  TROPONINI <0.30  PROBNP 7170.0*   Glucose No results found for this basename: GLUCAP,  in the last 168 hours  IMAGING: Dg Chest 2 View  03/30/2014   CLINICAL DATA:  Several month history of shortness of breath with history of CHF.  EXAM: CHEST  2 VIEW  COMPARISON:  Portable chest x-ray of March 16, 2014  FINDINGS: The lungs are well-expanded. The interstitial markings are mildly prominent. The cardiac silhouette is top-normal in size. The pulmonary vascularity is not clearly engorged. There is no pleural effusion. The bony thorax is unremarkable.  IMPRESSION: Mildly increased interstitial markings may reflect interstitial pneumonia but mild CHF may produce similar findings. There is no classic alveolar pneumonia.   Electronically Signed   By: David  Martinique   On: 03/30/2014 18:17   Ct Chest Wo Contrast  03/30/2014   CLINICAL DATA:  Shortness of breath  EXAM: CT CHEST WITHOUT CONTRAST  TECHNIQUE: Multidetector CT imaging of the chest was performed following the standard protocol without IV contrast.  COMPARISON:  Recent chest radiography, including 03/16/2014  FINDINGS: THORACIC INLET/BODY WALL:  No acute abnormality.  MEDIASTINUM:  No cardiomegaly. Multi focal coronary artery atherosclerosis. Small volume of fat along the right side of the interventricular septum which could be from remote infarct. No pericardial effusion. Prominent mediastinal lymph nodes, some containing coarse calcification. Right hilar nodes are also calcified. The largest node is in the sub- carina region measuring 16 mm short axis. No acute vascular findings. The main pulmonary artery appears normal, but the right pulmonary artery appears mildly dilated, equivocal for pulmonary  hypertension.  LUNG WINDOWS:  There is diffuse but mildly basilar predominant subpleural reticulation. Lung volumes remain normal. No consolidation or suspicious nodularity. Fissures are mildly distorted, suggesting fibrotic changes, but no honeycombing noted. No pleural effusion.  UPPER ABDOMEN:  No acute findings.  OSSEOUS:  No acute fracture.  No suspicious lytic or blastic lesions.  IMPRESSION: Diffuse interstitial lung abnormality. While an acute atypical infection is a diagnostic possibility, there has been persistent interstitial abnormality over multiple months by chest radiography. 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 The current abnormality is not typical of alveolar hemorrhage, but recommend correlation with chest CT imaging from time of diagnosis.   Electronically Signed   By: Jorje Guild M.D.   On: 03/30/2014 23:33   ASSESSMENT / PLAN:  Hypoxemic respiratory failure, multifactorial Pneumonia ( atypical / viral / PCP? 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   Supplemental oxygen for SpO2>92  Agree with Vancomycin / Cefepime / Levaquin coverage; may consider Bactrim if clinically worse  Sputum culture / respiratory viral panel if able to produce  Limited diagnostic value of PCT level in immunosuppressed   Increase Prednisone to 60 mg daily; may consider pulse dose steroids ( 1 g / day ) if worse  Continue Coumadin; bridge with Heparin gtt per pharmacy  Repeat TTE  Thank you for  interesting consult, will follow  I have personally obtained history, examined patient, evaluated and interpreted laboratory and imaging results, reviewed medical records, formulated assessment / plan and placed orders.  Doree Fudge, MD Pulmonary and [place] Pager: [phone]  03/31/2014, 11:31 AM

## 2014-03-31 NOTE — Consult Note (Signed)
Reason for Consult:atrial flutter with an RVR  Referring Physician: Dr. Davene Costain is an 73 y.o. male.   HPI: The patient is a 73 yo man with atrial flutter for over a year, HTN, diastolic heart failure and Goodpasture's disease who presented with worsening sob, malaise and temp of 102. He has developed a rapid ventricular rate in atrial flutter. We are asked for additional recommendations. I saw the patient once in 2012. At that time he was having both atrial fib and flutter and was minimally symptomatic. He has been on rate control therapy as well as systemic anti-coagulation. His blood pressure has been on the low side and he actually had symptomatic hypotension on 50 mg twice daily of metoprolol. He felt better with smaller doses.    PMH: Past Medical History  Diagnosis Date  . Atrial flutter started in 2011  . Chronic diastolic heart failure   . DVT   . HYPERLIPIDEMIA   . HYPERTENSION, BENIGN   . ALLERGIC ASTHMA   . Palpitations   . H/O Goodpasture's syndrome     "autoimmune disease; affects kidneys and lungs"  . DVT (deep venous thrombosis) 09/2001    "right groin"  . PE (pulmonary embolism) 09/2001  . CHF (congestive heart failure)   . Anemia   . History of blood transfusion     "related to Goodpasture's syndrome; plasmapheresis process"  . Stomach ulcer   . Arthritis     "hands, knees" (03/16/2014)  . Gout   . Renal insufficiency     "related to the Goodpasture's"  . Atrial flutter with rapid ventricular response 03/16/2014  . Acute on chronic diastolic HF (heart failure) in combination with rapid a flutter and anemia 03/18/2014    PSHX: Past Surgical History  Procedure Laterality Date  . Renal biopsy, percutaneous  12/2013  . Vena cava filter placement  09/2001    FAMHX: Family History  Problem Relation Age of Onset  . Stroke Mother   . Heart attack Maternal Grandmother   . Coronary artery disease      negative history of early CAD    Social  History:  reports that he quit smoking about 46 years ago. His smoking use included Cigarettes. He has a 6 pack-year smoking history. He has never used smokeless tobacco. He reports that he drinks about 8.4 ounces of alcohol per week. He reports that he does not use illicit drugs.  Allergies: No Known Allergies  Medications: reviewed  Dg Chest 2 View  03/30/2014   CLINICAL DATA:  Several month history of shortness of breath with history of CHF.  EXAM: CHEST  2 VIEW  COMPARISON:  Portable chest x-ray of March 16, 2014  FINDINGS: The lungs are well-expanded. The interstitial markings are mildly prominent. The cardiac silhouette is top-normal in size. The pulmonary vascularity is not clearly engorged. There is no pleural effusion. The bony thorax is unremarkable.  IMPRESSION: Mildly increased interstitial markings may reflect interstitial pneumonia but mild CHF may produce similar findings. There is no classic alveolar pneumonia.   Electronically Signed   By: David  Martinique   On: 03/30/2014 18:17   Ct Chest Wo Contrast  03/30/2014   CLINICAL DATA:  Shortness of breath  EXAM: CT CHEST WITHOUT CONTRAST  TECHNIQUE: Multidetector CT imaging of the chest was performed following the standard protocol without IV contrast.  COMPARISON:  Recent chest radiography, including 03/16/2014  FINDINGS: THORACIC INLET/BODY WALL:  No acute abnormality.  MEDIASTINUM:  No cardiomegaly. Multi  focal coronary artery atherosclerosis. Small volume of fat along the right side of the interventricular septum which could be from remote infarct. No pericardial effusion. Prominent mediastinal lymph nodes, some containing coarse calcification. Right hilar nodes are also calcified. The largest node is in the sub- carina region measuring 16 mm short axis. No acute vascular findings. The main pulmonary artery appears normal, but the right pulmonary artery appears mildly dilated, equivocal for pulmonary hypertension.  LUNG WINDOWS:  There is diffuse  but mildly basilar predominant subpleural reticulation. Lung volumes remain normal. No consolidation or suspicious nodularity. Fissures are mildly distorted, suggesting fibrotic changes, but no honeycombing noted. No pleural effusion.  UPPER ABDOMEN:  No acute findings.  OSSEOUS:  No acute fracture.  No suspicious lytic or blastic lesions.  IMPRESSION: Diffuse interstitial lung abnormality. While an acute atypical infection is a diagnostic possibility, there has been persistent interstitial abnormality over multiple months by chest radiography. 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 The current abnormality is not typical of alveolar hemorrhage, but recommend correlation with chest CT imaging from time of diagnosis.   Electronically Signed   By: Jorje Guild M.D.   On: 03/30/2014 23:33    ROS  As stated in the HPI and negative for all other systems.  Physical Exam  Vitals:Blood pressure 109/72, pulse 117, temperature 98.5 F (36.9 C), temperature source Oral, resp. rate 23, height 5\' 10"  (1.778 m), weight 176 lb (79.833 kg), SpO2 92.00%.  stable appearing NAD HEENT: Unremarkable Neck:  No JVD, no thyromegally Back:  No CVA tenderness Lungs:  Clear with no wheezes HEART:  IRegular tachy rhythm, no murmurs, no rubs, no clicks Abd:  soft, positive bowel sounds, no organomegally, no rebound, no guarding Ext:  2 plus pulses, no edema, no cyanosis, no clubbing Skin:  No rashes no nodules Neuro:  CN II through XII intact, motor grossly intact  Tele - atrial flutter with a RVR Assessment/Plan: 1. Atrial flutter with an RVR 2. Remote atrial fib 3. Goodpasture's syndrome 4. Diastolic heart failure 5. Remote pulmonary embolism Rec: Will start low dose cardizem IV in conjunction with metoprolol. Once his underlying febrile illness resolved, will transition to additional oral meds for rate control. Once  has has been anti-coagulated for several weeks, would consider catheter ablation of atrial flutter as the patient has had atrial flutter on the past 6 ECG's rather than atrial fib. Maintenance of NSR would like improve symptoms.   Carleene Overlie TaylorMD 03/31/2014, 12:36 PM

## 2014-04-01 ENCOUNTER — Encounter (HOSPITAL_COMMUNITY): Payer: Self-pay | Admitting: Cardiology

## 2014-04-01 DIAGNOSIS — I059 Rheumatic mitral valve disease, unspecified: Secondary | ICD-10-CM

## 2014-04-01 DIAGNOSIS — M310 Hypersensitivity angiitis: Secondary | ICD-10-CM

## 2014-04-01 DIAGNOSIS — I4892 Unspecified atrial flutter: Secondary | ICD-10-CM | POA: Diagnosis present

## 2014-04-01 DIAGNOSIS — R9430 Abnormal result of cardiovascular function study, unspecified: Secondary | ICD-10-CM | POA: Diagnosis present

## 2014-04-01 LAB — CBC
HEMATOCRIT: 23.6 % — AB (ref 39.0–52.0)
Hemoglobin: 8 g/dL — ABNORMAL LOW (ref 13.0–17.0)
MCH: 32.9 pg (ref 26.0–34.0)
MCHC: 33.9 g/dL (ref 30.0–36.0)
MCV: 97.1 fL (ref 78.0–100.0)
PLATELETS: 194 10*3/uL (ref 150–400)
RBC: 2.43 MIL/uL — ABNORMAL LOW (ref 4.22–5.81)
RDW: 20.9 % — AB (ref 11.5–15.5)
WBC: 3.6 10*3/uL — ABNORMAL LOW (ref 4.0–10.5)

## 2014-04-01 LAB — PROTIME-INR
INR: 2.08 — ABNORMAL HIGH (ref 0.00–1.49)
PROTHROMBIN TIME: 23.4 s — AB (ref 11.6–15.2)

## 2014-04-01 LAB — LEGIONELLA ANTIGEN, URINE: LEGIONELLA ANTIGEN, URINE: NEGATIVE

## 2014-04-01 LAB — COMPREHENSIVE METABOLIC PANEL
ALBUMIN: 2.3 g/dL — AB (ref 3.5–5.2)
ALK PHOS: 52 U/L (ref 39–117)
ALT: 41 U/L (ref 0–53)
ANION GAP: 13 (ref 5–15)
AST: 32 U/L (ref 0–37)
BILIRUBIN TOTAL: 0.5 mg/dL (ref 0.3–1.2)
BUN: 26 mg/dL — ABNORMAL HIGH (ref 6–23)
CHLORIDE: 100 meq/L (ref 96–112)
CO2: 23 mEq/L (ref 19–32)
Calcium: 9.1 mg/dL (ref 8.4–10.5)
Creatinine, Ser: 1.38 mg/dL — ABNORMAL HIGH (ref 0.50–1.35)
GFR calc Af Amer: 57 mL/min — ABNORMAL LOW (ref >90)
GFR, EST NON AFRICAN AMERICAN: 49 mL/min — AB (ref >90)
Glucose, Bld: 95 mg/dL (ref 70–99)
POTASSIUM: 4 meq/L (ref 3.7–5.3)
Sodium: 136 mEq/L — ABNORMAL LOW (ref 137–147)
Total Protein: 5.5 g/dL — ABNORMAL LOW (ref 6.0–8.3)

## 2014-04-01 LAB — URINE CULTURE
COLONY COUNT: NO GROWTH
Culture: NO GROWTH

## 2014-04-01 LAB — HEPARIN LEVEL (UNFRACTIONATED): Heparin Unfractionated: 0.19 IU/mL — ABNORMAL LOW (ref 0.30–0.70)

## 2014-04-01 LAB — MAGNESIUM: Magnesium: 1.7 mg/dL (ref 1.5–2.5)

## 2014-04-01 MED ORDER — MAGNESIUM SULFATE 40 MG/ML IJ SOLN
2.0000 g | Freq: Once | INTRAMUSCULAR | Status: AC
  Administered 2014-04-01: 2 g via INTRAVENOUS
  Filled 2014-04-01: qty 50

## 2014-04-01 MED ORDER — DILTIAZEM HCL 30 MG PO TABS
30.0000 mg | ORAL_TABLET | Freq: Four times a day (QID) | ORAL | Status: DC
  Administered 2014-04-01 – 2014-04-05 (×13): 30 mg via ORAL
  Filled 2014-04-01 (×18): qty 1

## 2014-04-01 MED ORDER — WARFARIN SODIUM 3 MG PO TABS
3.0000 mg | ORAL_TABLET | Freq: Once | ORAL | Status: AC
  Administered 2014-04-01: 3 mg via ORAL
  Filled 2014-04-01: qty 1

## 2014-04-01 MED ORDER — VITAMINS A & D EX OINT
TOPICAL_OINTMENT | CUTANEOUS | Status: AC
  Administered 2014-04-01: 5
  Filled 2014-04-01: qty 5

## 2014-04-01 MED ORDER — ENSURE COMPLETE PO LIQD
237.0000 mL | Freq: Two times a day (BID) | ORAL | Status: DC
  Administered 2014-04-01 – 2014-04-05 (×8): 237 mL via ORAL

## 2014-04-01 MED ORDER — HEPARIN (PORCINE) IN NACL 100-0.45 UNIT/ML-% IJ SOLN
1600.0000 [IU]/h | INTRAMUSCULAR | Status: DC
  Filled 2014-04-01 (×2): qty 250

## 2014-04-01 MED ORDER — PREDNISONE 20 MG PO TABS
20.0000 mg | ORAL_TABLET | Freq: Every day | ORAL | Status: DC
  Administered 2014-04-02 – 2014-04-03 (×2): 20 mg via ORAL
  Filled 2014-04-01 (×2): qty 1

## 2014-04-01 NOTE — Progress Notes (Signed)
CMT alerted that patient had 9 beats v-tach, patient's BP 114/68, HR 100s-110s in a-flutter on cardizem drip at 10/hr. Patient resting, no chest pain or shortness of breath. Fredirick Maudlin, NP notified. Will continue to monitor patient.

## 2014-04-01 NOTE — Progress Notes (Signed)
ANTICOAGULATION CONSULT NOTE - Initial Consult  Pharmacy Consult for Heparin, Warfarin Indication: Atrial flutter, Hx PE  No Known Allergies  Patient Measurements: Height: 5\' 10"  (177.8 cm) Weight: 177 lb 11.1 oz (80.6 kg) IBW/kg (Calculated) : 73 Heparin Dosing Weight: 79. 8 kg  Vital Signs: Temp: 98.3 F (36.8 C) (07/05 0400) Temp src: Oral (07/05 0400) BP: 114/64 mmHg (07/05 0600) Pulse Rate: 85 (07/05 0600)  Labs:  Recent Labs  03/30/14 2015 03/31/14 0100 03/31/14 0212 03/31/14 2100 04/01/14 0612  HGB 8.3*  --  8.1*  --  8.0*  HCT 24.3*  --  23.9*  --  23.6*  PLT 236  --  224  --  194  LABPROT  --  17.0*  --   --  23.4*  INR  --  1.38  --   --  2.08*  HEPARINUNFRC  --   --   --  0.14* 0.19*  CREATININE 1.54*  --  1.60*  --  1.38*  TROPONINI <0.30  --   --   --   --     Estimated Creatinine Clearance: 49.2 ml/min (by C-G formula based on Cr of 1.38).   Medications:  Scheduled:  . ceFEPime (MAXIPIME) IV  1 g Intravenous Q12H  . cyclobenzaprine  5 mg Oral QHS  . docusate sodium  100 mg Oral BID  . dutasteride  0.5 mg Oral Daily   And  . tamsulosin  0.4 mg Oral Daily  . latanoprost  1 drop Both Eyes Daily  . levofloxacin (LEVAQUIN) IV  750 mg Intravenous Q48H  . metoprolol  50 mg Oral BID  . montelukast  10 mg Oral QHS  . pantoprazole  40 mg Oral Daily  . predniSONE  60 mg Oral Q breakfast  . sodium chloride  3 mL Intravenous Q12H  . sodium chloride  3 mL Intravenous Q12H  . vancomycin  750 mg Intravenous Q12H  . Warfarin - Pharmacist Dosing Inpatient   Does not apply q1800   Infusions:  . diltiazem (CARDIZEM) infusion 10 mg/hr (04/01/14 0437)  . heparin 1,400 Units/hr (03/31/14 2200)   PRN: sodium chloride, acetaminophen, acetaminophen, HYDROcodone-acetaminophen, ipratropium, levalbuterol, ondansetron (ZOFRAN) IV, ondansetron, sodium chloride, traMADol  Assessment: 73 yr male with significant PMH including AFib/Aflutter, h/o DVT + PE (Jan 2003),  IVC filter placed and Goodpasture's syndrome presents with fever. Pt on coumadin PTA for AFib/h/o PE. INR was subtherapeutic, so heparin was started 7/4 until INR therapeutic.   PTA warfarin dosage 4.5mg  daily  Heparin level remains SUBtherapeutic (0.19) on a rate of 1400 units/hr  INR is now therapeutic (2.08) and increased significantly from 1.38 yesterday after boosted warfarin dose of 6mg  on 7/4.  Drug interaction: Levaquin  Goal of Therapy:  INR 2-3 Heparin level 0.3-0.7 units/ml Monitor PLT while on Heparin: Yes   Plan:   Increase Heparin drip to 1600 units/hr  Check 8 hr HL then daily HL/CBC   Decrease warfarin 3mg  today at 18:00 given quick rise  Daily PT/INR  F/u if heparin needs to continue with therapeutic INR  Peggyann Juba, PharmD, BCPS Pager: [phone] 04/01/2014,7:17 AM

## 2014-04-01 NOTE — Progress Notes (Signed)
Patient's HR sustaining in 130s-140s after getting up to use the bathroom and not decreasing with rest. BP 131/77. Cardizem drip increased to 10 mg/hr. Will continue to monitor patient and obtain frequent vital signs.

## 2014-04-01 NOTE — Consult Note (Signed)
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  SIGNIFICANT EVENTS / STUDIES:  7/3  Chest CT >>> Diffuse interstitial lung abnormality  LINES / TUBES:  CULTURES:  ANTIBIOTICS: Cefepime 7/3 >>> 7/5 Levaquin 7/3 >>> Vancomycin 7/3 >>> 7/5  INTERVAL HISTORY:  Seen by Cardiology, input appreciated.  INR now therapeutic, Heparin gtt d/c'd.  Less dyspnea with controlled HR.  VITAL SIGNS: Temp:  [97.9 F (36.6 C)-98.5 F (36.9 C)] 98.2 F (36.8 C) (07/05 0800) Pulse Rate:  [45-139] 85 (07/05 0600) Resp:  [18-28] 21 (07/05 0600) BP: (98-131)/(54-84) 114/64 mmHg (07/05 0600) SpO2:  [84 %-97 %] 96 % (07/05 0600) Weight:  [80.6 kg (177 lb 11.1 oz)] 80.6 kg (177 lb 11.1 oz) (07/05 0400)  HEMODYNAMICS:   VENTILATOR SETTINGS:   INTAKE / OUTPUT: Intake/Output     07/04 0701 - 07/05 0700 07/05 0701 - 07/06 0700   P.O. 720    I.V. (mL/kg) 331.5 (4.1)    IV Piggyback 550    Total Intake(mL/kg) 1601.5 (19.9)    Urine (mL/kg/hr) 1925 (1) 450 (1.4)   Total Output 1925 450   Net -323.5 -450         PHYSICAL EXAMINATION: General:  Comfortable Neuro:  Awake, alert HEENT:  PERRL Cardiovascular:  Regular, heart rate controlled, flutter on monitor Lungs:  Bilateral air entry, few rales Abdomen:  Soft, nontender, bowel sounds diminished Musculoskeletal:  Moves all extremities, no edema Skin:  Intact  LABS: CBC  Recent Labs Lab 03/30/14 2015 03/31/14 0212 04/01/14 0612  WBC 4.2 3.5* 3.6*  HGB 8.3* 8.1* 8.0*  HCT 24.3* 23.9* 23.6*  PLT 236 224 194   Coag's  Recent Labs Lab 03/31/14 0100 04/01/14 0612  INR 1.38 2.08*    BMET  Recent Labs Lab 03/30/14 2015 03/31/14 0212 04/01/14 0612  NA 129* 132* 136*  K 4.3 4.0 4.0  CL 93* 97 100  CO2 23 24 23   BUN 27* 28* 26*  CREATININE 1.54* 1.60* 1.38*  GLUCOSE 90 85 95   Electrolytes  Recent Labs Lab 03/30/14 2015 03/31/14 0212 04/01/14 0612 04/01/14 0835  CALCIUM 8.7 8.6 9.1  --   MG  --  1.6  --  1.7  PHOS  --  3.4  --   --    Sepsis Markers  Recent Labs Lab 03/30/14 2043 03/31/14 0047  LATICACIDVEN 0.84  --   PROCALCITON  --  0.18   ABG No results found for this basename: PHART, PCO2ART, PO2ART,  in the last 168 hours  Liver Enzymes  Recent Labs Lab 03/30/14 2015 03/31/14 0212 04/01/14 0612  AST 24 27 32  ALT 37 35 41  ALKPHOS 54 52 52  BILITOT 0.9 0.8 0.5  ALBUMIN 2.5* 2.4* 2.3*   Cardiac Enzymes  Recent Labs Lab 03/30/14 2015  TROPONINI <0.30  PROBNP 7170.0*   Glucose No results found for this basename: GLUCAP,  in the last 168 hours  IMAGING: Dg Chest 2 View  03/30/2014   CLINICAL DATA:  Several month history of shortness of breath with history of CHF.  EXAM: CHEST  2 VIEW  COMPARISON:  Portable chest x-ray of March 16, 2014  FINDINGS: The lungs are well-expanded. The interstitial markings are mildly prominent. The cardiac silhouette is top-normal in size. The pulmonary vascularity is not clearly engorged. There is no pleural effusion. The bony thorax is unremarkable.  IMPRESSION: Mildly increased interstitial markings may reflect interstitial pneumonia but mild CHF may produce similar findings. There is no classic alveolar pneumonia.   Electronically Signed   By: David  Martinique   On: 03/30/2014 18:17   Ct Chest Wo Contrast  03/30/2014   CLINICAL DATA:  Shortness of breath  EXAM: CT CHEST WITHOUT CONTRAST  TECHNIQUE: Multidetector CT imaging of the chest was performed following the standard protocol without IV contrast.  COMPARISON:  Recent chest radiography, including 03/16/2014  FINDINGS: THORACIC INLET/BODY WALL:  No  acute abnormality.  MEDIASTINUM:  No cardiomegaly. Multi focal coronary artery atherosclerosis. Small volume of fat along the right side of the interventricular septum which could be from remote infarct. No pericardial effusion. Prominent mediastinal lymph nodes, some containing coarse calcification. Right hilar nodes are also calcified. The largest node is in the sub- carina region measuring 16 mm short axis. No acute vascular findings. The main pulmonary artery appears normal, but the right pulmonary artery appears mildly dilated, equivocal for pulmonary hypertension.  LUNG WINDOWS:  There is diffuse but mildly basilar predominant subpleural reticulation. Lung volumes remain normal. No consolidation or suspicious nodularity. Fissures are mildly distorted, suggesting fibrotic changes, but no honeycombing noted. No pleural effusion.  UPPER ABDOMEN:  No acute findings.  OSSEOUS:  No acute fracture.  No suspicious lytic or blastic lesions.  IMPRESSION: Diffuse interstitial lung abnormality. While an acute atypical infection is a diagnostic possibility, there has been persistent interstitial abnormality over multiple months by chest radiography. 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 The current abnormality is not typical of alveolar hemorrhage, but recommend correlation with chest CT imaging from time of diagnosis.   Electronically Signed   By: Jorje Guild M.D.   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, diffuse alveolar hemorrhage, Goodpasture's syndrome Pulmonary hypertension; PAP by TTE 3/27 45 torr Immunosuppressed / on chronic Prednisone   Supplemental oxygen  for SpO2>92  Will need ambient air SpO2 and if normal, ambulating SpO2 before d/c  Simplify abx to Levaquin only  Sputum culture / respiratory viral panel if able to produce  Decrease Prednisone to 20 mg daily as preadmission  Continue Coumadin  Await TTE  Flutter management per Cardiology ( mildly hypotensive with controlled rate on Cardizem gtt, changed to 30 mg PO q6h )  Plan for ablation after several weeks of anticoagulation  I have personally obtained history, examined patient, evaluated and interpreted laboratory and imaging results, reviewed medical records, formulated assessment / plan and placed orders.  Doree Fudge, MD [facility]monary [facility] [place] Pager: [phone]  04/01/2014, 11:08 AM

## 2014-04-01 NOTE — Progress Notes (Addendum)
Patient ID: Bryan Hubbard, male   DOB: 1940/10/16, 73 y.o.   MRN: [id number]    Subjective:   + SOB   Objective:   Temp:  [97.9 F (36.6 C)-99.1 F (37.3 C)] 98.3 F (36.8 C) (07/05 0400) Pulse Rate:  [45-139] 85 (07/05 0600) Resp:  [18-28] 21 (07/05 0600) BP: (98-131)/(54-84) 114/64 mmHg (07/05 0600) SpO2:  [84 %-97 %] 96 % (07/05 0600) Weight:  [177 lb 11.1 oz (80.6 kg)] 177 lb 11.1 oz (80.6 kg) (07/05 0400) Last BM Date: 03/31/14  Filed Weights   03/30/14 1949 04/01/14 0400  Weight: 176 lb (79.833 kg) 177 lb 11.1 oz (80.6 kg)    Intake/Output Summary (Last 24 hours) at 04/01/14 0751 Last data filed at 04/01/14 0700  Gross per 24 hour  Intake 1601.52 ml  Output   1925 ml  Net -323.48 ml    Telemetry: aflutter  Exam:  General: NAD:  Resp: coarse bilatrally  Cardiac: irregular, no m/r/g  GI: abdomen soft, NT, ND  MSK: no LE edema  Neuro: no focal deficits  Psych: appropriate affect  Lab Results:  Basic Metabolic Panel:  Recent Labs Lab 03/30/14 2015 03/31/14 0212 04/01/14 0612  NA 129* 132* 136*  K 4.3 4.0 4.0  CL 93* 97 100  CO2 23 24 23   GLUCOSE 90 85 95  BUN 27* 28* 26*  CREATININE 1.54* 1.60* 1.38*  CALCIUM 8.7 8.6 9.1  MG  --  1.6  --     Liver Function Tests:  Recent Labs Lab 03/30/14 2015 03/31/14 0212 04/01/14 0612  AST 24 27 32  ALT 37 35 41  ALKPHOS 54 52 52  BILITOT 0.9 0.8 0.5  PROT 5.5* 5.5* 5.5*  ALBUMIN 2.5* 2.4* 2.3*    CBC:  Recent Labs Lab 03/30/14 2015 03/31/14 0212 04/01/14 0612  WBC 4.2 3.5* 3.6*  HGB 8.3* 8.1* 8.0*  HCT 24.3* 23.9* 23.6*  MCV 96.4 97.6 97.1  PLT 236 224 194    Cardiac Enzymes:  Recent Labs Lab 03/30/14 2015  TROPONINI <0.30    BNP:  Recent Labs  12/18/13 1635 03/05/14 0938 03/30/14 2015  PROBNP 5296.0* 573.0* 7170.0*    Coagulation:  Recent Labs Lab 03/31/14 0100 04/01/14 0612  INR 1.38 2.08*    ECG:   Medications:   Scheduled Medications: .  ceFEPime (MAXIPIME) IV  1 g Intravenous Q12H  . cyclobenzaprine  5 mg Oral QHS  . docusate sodium  100 mg Oral BID  . dutasteride  0.5 mg Oral Daily   And  . tamsulosin  0.4 mg Oral Daily  . latanoprost  1 drop Both Eyes Daily  . levofloxacin (LEVAQUIN) IV  750 mg Intravenous Q48H  . metoprolol  50 mg Oral BID  . montelukast  10 mg Oral QHS  . pantoprazole  40 mg Oral Daily  . predniSONE  60 mg Oral Q breakfast  . sodium chloride  3 mL Intravenous Q12H  . sodium chloride  3 mL Intravenous Q12H  . vancomycin  750 mg Intravenous Q12H  . warfarin  3 mg Oral ONCE-1800  . Warfarin - Pharmacist Dosing Inpatient   Does not apply q1800     Infusions: . diltiazem (CARDIZEM) infusion 10 mg/hr (04/01/14 0437)  . heparin       PRN Medications:  sodium chloride, acetaminophen, acetaminophen, HYDROcodone-acetaminophen, ipratropium, levalbuterol, ondansetron (ZOFRAN) IV, ondansetron, sodium chloride, traMADol     Assessment/Plan    1. Aflutter - patient evaluated by EP yesterday, initiated  on dilt gtt in addition to oral metoprolol. - plans per Dr Lovena Le are to consider possible ablation procedure once febrile illness resolved and patient has been anticoagulated for several weeks. He has hx of aflutter and afib however on last several EKGs predominant rhyhtm has been aflutter.   - echo pending - rates controlled most of evening, some elevated rates to 130s this morning around 4AM. Currently in aflutter rate 100, dilt drip is at 10. He did not receive his oral metoprolol last night, unclear why held, it appears he may have had some soft bp's at that time that have resolved. .  - continue oral metop and dilt gtt today given recent labile rates this morning, likely try transition to oral dilt tomorrow with weaning of drip.  - will stop heparin now that INR is therapeutic.   2. Hypoxic respiratory failure - secondary to pneumonia, on abx per primary team.      Carlyle Dolly, M.D.,  F.A.C.C.

## 2014-04-01 NOTE — Progress Notes (Addendum)
INITIAL NUTRITION ASSESSMENT  DOCUMENTATION CODES Per approved criteria  -Not Applicable   INTERVENTION: -Ensure Complete po BID, each supplement provides 350 kcal and 13 grams of protein -Encourage intake of meals and supplements RD to follow.  NUTRITION DIAGNOSIS: Predicted sub optimal intake related to decreased appetite as evidenced by patient report..   Goal: Intake of meals and supplements to meet >90% estimated needs.  Monitor:  Intake, labs, weight trend.  Reason for Assessment: MST  73 y.o. male  Admitting Dx: <principal problem not specified>  ASSESSMENT: Patient with Goodpasture's syndrome who presented 7/3 with hypoxemic respiratory failure and interstitial process on chest CT.    Patient reports fair appetite currently with good intake of meals and Ensure from home.  Reports poor intake prior to admit.  Variable weigh thought secondary to fluid changes and decreased oral intake overall for the past month.  Follows regular diet.  Nutrition Focused Physical Exam:  Subcutaneous Fat:  Orbital Region: wnl Upper Arm Region:  mild Thoracic and Lumbar Region: n/a  Muscle:  Temple Region: wnl Clavicle Bone Region: mild Clavicle and Acromion Bone Region: mild Scapular Bone Region: mild Dorsal Hand: n/a Patellar Region: wnl Anterior Thigh Region: wnl Posterior Calf Region: wnl  Edema: yes    Height: Ht Readings from Last 1 Encounters:  03/30/14 5\' 10"  (1.778 m)    Weight: Wt Readings from Last 1 Encounters:  04/01/14 177 lb 11.1 oz (80.6 kg)    Ideal Body Weight: 166 lbs  % Ideal Body Weight: 107  Wt Readings from Last 10 Encounters:  04/01/14 177 lb 11.1 oz (80.6 kg)  03/30/14 176 lb (79.833 kg)  03/23/14 176 lb 4 oz (79.946 kg)  03/18/14 178 lb 8 oz (80.967 kg)  03/05/14 182 lb 1.9 oz (82.609 kg)  01/24/14 184 lb 15.5 oz (83.9 kg)  01/23/14 176 lb (79.833 kg)  01/20/14 177 lb 14.6 oz (80.7 kg)  01/19/14 181 lb 7 oz (82.3 kg)  01/17/14  182 lb (82.555 kg)    Usual Body Weight: 177-182   % Usual Body Weight: 100  BMI:  Body mass index is 25.5 kg/(m^2).  Estimated Nutritional Needs: Kcal: 2100-2200 Protein: 95-105 Fluid: 2.1-1.2 L daily  Skin: intact  Diet Order: Sodium Restricted  EDUCATION NEEDS: -No education needs identified at this time   Intake/Output Summary (Last 24 hours) at 04/01/14 1149 Last data filed at 04/01/14 0947  Gross per 24 hour  Intake 1011.52 ml  Output   2075 ml  Net -1063.48 ml    Labs:   Recent Labs Lab 03/30/14 2015 03/31/14 0212 04/01/14 0612 04/01/14 0835  NA 129* 132* 136*  --   K 4.3 4.0 4.0  --   CL 93* 97 100  --   CO2 23 24 23   --   BUN 27* 28* 26*  --   CREATININE 1.54* 1.60* 1.38*  --   CALCIUM 8.7 8.6 9.1  --   MG  --  1.6  --  1.7  PHOS  --  3.4  --   --   GLUCOSE 90 85 95  --     CBG (last 3)  No results found for this basename: GLUCAP,  in the last 72 hours  Scheduled Meds: . cyclobenzaprine  5 mg Oral QHS  . diltiazem  30 mg Oral 4 times per day  . docusate sodium  100 mg Oral BID  . dutasteride  0.5 mg Oral Daily   And  . tamsulosin  0.4  mg Oral Daily  . latanoprost  1 drop Both Eyes Daily  . levofloxacin (LEVAQUIN) IV  750 mg Intravenous Q48H  . magnesium sulfate 1 - 4 g bolus IVPB  2 g Intravenous Once  . metoprolol  50 mg Oral BID  . montelukast  10 mg Oral QHS  . pantoprazole  40 mg Oral Daily  . [START ON 04/02/2014] predniSONE  20 mg Oral Q breakfast  . sodium chloride  3 mL Intravenous Q12H  . sodium chloride  3 mL Intravenous Q12H  . warfarin  3 mg Oral ONCE-1800  . Warfarin - Pharmacist Dosing Inpatient   Does not apply q1800    Continuous Infusions:   Past Medical History  Diagnosis Date  . Atrial flutter started in 2011  . Chronic diastolic heart failure   . DVT   . HYPERLIPIDEMIA   . HYPERTENSION, BENIGN   . ALLERGIC ASTHMA   . Palpitations   . H/O Goodpasture's syndrome     "autoimmune disease; affects kidneys and  lungs"  . DVT (deep venous thrombosis) 09/2001    "right groin"  . PE (pulmonary embolism) 09/2001  . CHF (congestive heart failure)   . Anemia   . History of blood transfusion     "related to Goodpasture's syndrome; plasmapheresis process"  . Stomach ulcer   . Arthritis     "hands, knees" (03/16/2014)  . Gout   . Renal insufficiency     "related to the Goodpasture's"  . Atrial flutter with rapid ventricular response 03/16/2014  . Acute on chronic diastolic HF (heart failure) in combination with rapid a flutter and anemia 03/18/2014    Past Surgical History  Procedure Laterality Date  . Renal biopsy, percutaneous  12/2013  . Vena cava filter placement  09/2001    Antonieta Iba, RD, LDN Clinical Inpatient Dietitian Pager:  [phone] Weekend and after hours pager:  [phone]

## 2014-04-01 NOTE — Progress Notes (Signed)
  Echocardiogram 2D Echocardiogram has been performed.  Bryan Hubbard M 04/01/2014, 10:29 AM

## 2014-04-01 NOTE — Progress Notes (Signed)
PROGRESS NOTE  JARMARCUS WAMBOLD OIZ:[id number] DOB: 1941/08/05 DOA: 03/30/2014 PCP: [name], MD  HPI: Bryan Hubbard is a 73 y.o. male with A flutter, recently diagnosed Goodpasture sd admitted with fevers, shortness f breath, A flutter with RVR  Assessment/Plan: Acute hypoxic respiratory failure - due to ?HCAP vs interstitial lung disease, oxygen as needed, continue supportive treatment. Pulmonology consulted, appreciate input.  - patient stable today, he feels better subjectively HCAP - with fever this certainly possible, some atypical appearance on the chest CT,  - continue current regimen, consider discontinuation of antibiotics 24-48 h if cultures remain negative or narrow to levofloxacin only A flutter with RVR - was on Metoprolol 75 BID but had side effects as an outpatient and his dose was decreased to 50 BID.  - cardiology consulted, appreciate input, on cardizem gtt - continue Coumadin, however INR sub therapeutic on admission, on heparin yesterday, INR better today, d/c heparin CKD III - Cr at baseline, good UOP. Avoid nephrotoxins.  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  Anemia - of chronic disease  Diet: low sodium Fluids: none DVT Prophylaxis: Coumadin  Code Status: Full Family Communication: d/w wife  Disposition Plan: inpatient  Consultants:  Cardiology   PCCM  Procedures:  None    Antibiotics Vancomycin 7/3 >> Cefepime 7/3 >> Levofloxacin 7/4 >>  HPI/Subjective: Feeling better, dyspnea better, no chest pain  Objective: Filed Vitals:   04/01/14 0530 04/01/14 0547 04/01/14 0600 04/01/14 0800  BP: 104/74 114/68 114/64   Pulse: 65 106 85   Temp:    98.2 F (36.8 C)  TempSrc:    Oral  Resp: 20 19 21    Height:      Weight:      SpO2: 94% 94% 96%     Intake/Output Summary (Last 24 hours) at 04/01/14 0954 Last data filed at 04/01/14 0947  Gross per 24 hour  Intake 1211.52 ml  Output    2375 ml  Net -1163.48 ml   Filed Weights   03/30/14 1949 04/01/14 0400  Weight: 79.833 kg (176 lb) 80.6 kg (177 lb 11.1 oz)    Exam:  General:  NAD  Cardiovascular: irregular, tachycardic  Respiratory: diffuse rales, no wheezing, moves air well   Abdomen: soft, not tender to palpation, positive bowel sounds  MSK: no peripheral edema  Neuro: non focal   Data Reviewed: Basic Metabolic Panel:  Recent Labs Lab 03/30/14 2015 03/31/14 0212 04/01/14 0612 04/01/14 0835  NA 129* 132* 136*  --   K 4.3 4.0 4.0  --   CL 93* 97 100  --   CO2 23 24 23   --   GLUCOSE 90 85 95  --   BUN 27* 28* 26*  --   CREATININE 1.54* 1.60* 1.38*  --   CALCIUM 8.7 8.6 9.1  --   MG  --  1.6  --  1.7  PHOS  --  3.4  --   --    Liver Function Tests:  Recent Labs Lab 03/30/14 2015 03/31/14 0212 04/01/14 0612  AST 24 27 32  ALT 37 35 41  ALKPHOS 54 52 52  BILITOT 0.9 0.8 0.5  PROT 5.5* 5.5* 5.5*  ALBUMIN 2.5* 2.4* 2.3*   CBC:  Recent Labs Lab 03/30/14 2015 03/31/14 0212 04/01/14 0612  WBC 4.2 3.5* 3.6*  NEUTROABS 3.4  --   --   HGB 8.3* 8.1* 8.0*  HCT 24.3* 23.9* 23.6*  MCV 96.4 97.6 97.1  PLT 236 224 194   Cardiac Enzymes:  Recent Labs Lab 03/30/14 2015  TROPONINI <0.30   BNP (last 3 results)  Recent Labs  12/18/13 1635 03/05/14 0938 03/30/14 2015  PROBNP 5296.0* 573.0* 7170.0*   Recent Results (from the past 240 hour(s))  CULTURE, BLOOD (ROUTINE X 2)     Status: None   Collection Time    03/30/14  8:15 PM      Result Value Ref Range Status   Specimen Description BLOOD LEFT ANTECUBITAL   Final   Special Requests BOTTLES DRAWN AEROBIC AND ANAEROBIC 3CC   Final   Culture  Setup Time     Final   Value: 03/31/2014 02:17     Performed at [facility]   Culture     Final   Value:        BLOOD CULTURE RECEIVED NO GROWTH TO DATE CULTURE WILL BE HELD FOR 5 DAYS BEFORE ISSUING A FINAL NEGATIVE REPORT     Performed at [facility]   Report Status  PENDING   Incomplete  CULTURE, BLOOD (ROUTINE X 2)     Status: None   Collection Time    03/30/14  8:20 PM      Result Value Ref Range Status   Specimen Description BLOOD RIGHT FOREARM   Final   Special Requests BOTTLES DRAWN AEROBIC AND ANAEROBIC 5CC   Final   Culture  Setup Time     Final   Value: 03/31/2014 02:19     Performed at [facility]   Culture     Final   Value:        BLOOD CULTURE RECEIVED NO GROWTH TO DATE CULTURE WILL BE HELD FOR 5 DAYS BEFORE ISSUING A FINAL NEGATIVE REPORT     Performed at [facility]   Report Status PENDING   Incomplete  URINE CULTURE     Status: None   Collection Time    03/30/14  9:00 PM      Result Value Ref Range Status   Specimen Description URINE, CLEAN CATCH   Final   Special Requests NONE   Final   Culture  Setup Time     Final   Value: 03/31/2014 02:21     Performed at [facility]     Final   Value: NO GROWTH     Performed at [facility]   Culture     Final   Value: NO GROWTH     Performed at [facility]   Report Status 04/01/2014 FINAL   Final  MRSA PCR SCREENING     Status: None   Collection Time    03/31/14 12:49 AM      Result Value Ref Range Status   MRSA by PCR NEGATIVE  NEGATIVE Final   Comment:            The GeneXpert MRSA Assay (FDA     approved for NASAL specimens     only), is one component of a     comprehensive MRSA colonization     surveillance program. It is not     intended to diagnose MRSA     infection nor to guide or     monitor treatment for     MRSA infections.     Studies: Dg Chest 2 View  03/30/2014   CLINICAL DATA:  Several month history of shortness of breath with history of CHF.  EXAM: CHEST  2 VIEW  COMPARISON:  Portable chest x-ray of March 16, 2014  FINDINGS: The lungs are well-expanded. The interstitial markings are mildly prominent. The cardiac silhouette is top-normal in size. The pulmonary vascularity is not clearly engorged. There is  no pleural effusion. The bony thorax is unremarkable.  IMPRESSION: Mildly increased interstitial markings may reflect interstitial pneumonia but mild CHF may produce similar findings. There is no classic alveolar pneumonia.   Electronically Signed   By: David  Martinique   On: 03/30/2014 18:17   Ct Chest Wo Contrast  03/30/2014   CLINICAL DATA:  Shortness of breath  EXAM: CT CHEST WITHOUT CONTRAST  TECHNIQUE: Multidetector CT imaging of the chest was performed following the standard protocol without IV contrast.  COMPARISON:  Recent chest radiography, including 03/16/2014  FINDINGS: THORACIC INLET/BODY WALL:  No acute abnormality.  MEDIASTINUM:  No cardiomegaly. Multi focal coronary artery atherosclerosis. Small volume of fat along the right side of the interventricular septum which could be from remote infarct. No pericardial effusion. Prominent mediastinal lymph nodes, some containing coarse calcification. Right hilar nodes are also calcified. The largest node is in the sub- carina region measuring 16 mm short axis. No acute vascular findings. The main pulmonary artery appears normal, but the right pulmonary artery appears mildly dilated, equivocal for pulmonary hypertension.  LUNG WINDOWS:  There is diffuse but mildly basilar predominant subpleural reticulation. Lung volumes remain normal. No consolidation or suspicious nodularity. Fissures are mildly distorted, suggesting fibrotic changes, but no honeycombing noted. No pleural effusion.  UPPER ABDOMEN:  No acute findings.  OSSEOUS:  No acute fracture.  No suspicious lytic or blastic lesions.  IMPRESSION: Diffuse interstitial lung abnormality. While an acute atypical infection is a diagnostic possibility, there has been persistent interstitial abnormality over multiple months by chest radiography. 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 The current abnormality is not typical  of alveolar hemorrhage, but recommend correlation with chest CT imaging from time of diagnosis.   Electronically Signed   By: Jorje Guild M.D.   On: 03/30/2014 23:33    Scheduled Meds: . ceFEPime (MAXIPIME) IV  1 g Intravenous Q12H  . cyclobenzaprine  5 mg Oral QHS  . docusate sodium  100 mg Oral BID  . dutasteride  0.5 mg Oral Daily   And  . tamsulosin  0.4 mg Oral Daily  . latanoprost  1 drop Both Eyes Daily  . levofloxacin (LEVAQUIN) IV  750 mg Intravenous Q48H  . metoprolol  50 mg Oral BID  . montelukast  10 mg Oral QHS  . pantoprazole  40 mg Oral Daily  . predniSONE  60 mg Oral Q breakfast  . sodium chloride  3 mL Intravenous Q12H  . sodium chloride  3 mL Intravenous Q12H  . vancomycin  750 mg Intravenous Q12H  . warfarin  3 mg Oral ONCE-1800  . Warfarin - Pharmacist Dosing Inpatient   Does not apply q1800   Continuous Infusions: . diltiazem (CARDIZEM) infusion 10 mg/hr (04/01/14 0437)    Active Problems:   HYPERTENSION, BENIGN   CAP (community acquired pneumonia)   Anemia of chronic disease    Goodpasture's disease, treated with steroid, oral cytoxan and plasmapheresis     Acute on chronic diastolic HF (heart failure) in combination with rapid a flutter and anemia   CKD (chronic kidney disease) stage 3, GFR 30-59 ml/min   Atrial fibrillation with RVR   Sepsis  Time spent: 25  This note has  been created with Surveyor, quantity. Any transcriptional errors are unintentional.   Marzetta Board, MD [facility] Pager [phone]. If 7 PM - 7 AM, please contact night-coverage at [email or web address], password [facility] 04/01/2014, 9:54 AM  LOS: 2 days

## 2014-04-02 ENCOUNTER — Encounter (HOSPITAL_COMMUNITY): Admission: RE | Admit: 2014-04-02 | Payer: Medicare Other | Source: Ambulatory Visit

## 2014-04-02 DIAGNOSIS — I4892 Unspecified atrial flutter: Secondary | ICD-10-CM

## 2014-04-02 DIAGNOSIS — Z7901 Long term (current) use of anticoagulants: Secondary | ICD-10-CM

## 2014-04-02 LAB — PROTIME-INR
INR: 2.22 — ABNORMAL HIGH (ref 0.00–1.49)
Prothrombin Time: 24.6 seconds — ABNORMAL HIGH (ref 11.6–15.2)

## 2014-04-02 LAB — BASIC METABOLIC PANEL
Anion gap: 14 (ref 5–15)
BUN: 27 mg/dL — AB (ref 6–23)
CO2: 20 meq/L (ref 19–32)
CREATININE: 1.29 mg/dL (ref 0.50–1.35)
Calcium: 9.5 mg/dL (ref 8.4–10.5)
Chloride: 99 mEq/L (ref 96–112)
GFR calc Af Amer: 61 mL/min — ABNORMAL LOW (ref >90)
GFR calc non Af Amer: 53 mL/min — ABNORMAL LOW (ref >90)
GLUCOSE: 168 mg/dL — AB (ref 70–99)
Potassium: 4.1 mEq/L (ref 3.7–5.3)
Sodium: 133 mEq/L — ABNORMAL LOW (ref 137–147)

## 2014-04-02 LAB — CBC
HEMATOCRIT: 21.5 % — AB (ref 39.0–52.0)
Hemoglobin: 7.4 g/dL — ABNORMAL LOW (ref 13.0–17.0)
MCH: 33.2 pg (ref 26.0–34.0)
MCHC: 34.4 g/dL (ref 30.0–36.0)
MCV: 96.4 fL (ref 78.0–100.0)
Platelets: 192 10*3/uL (ref 150–400)
RBC: 2.23 MIL/uL — AB (ref 4.22–5.81)
RDW: 20.8 % — ABNORMAL HIGH (ref 11.5–15.5)
WBC: 3.9 10*3/uL — ABNORMAL LOW (ref 4.0–10.5)

## 2014-04-02 LAB — PROCALCITONIN: Procalcitonin: 0.14 ng/mL

## 2014-04-02 MED ORDER — CYCLOPHOSPHAMIDE 50 MG PO TABS
100.0000 mg | ORAL_TABLET | Freq: Every day | ORAL | Status: DC
  Administered 2014-04-02 – 2014-04-04 (×3): 100 mg via ORAL
  Filled 2014-04-02 (×3): qty 2

## 2014-04-02 MED ORDER — WARFARIN SODIUM 4 MG PO TABS
4.5000 mg | ORAL_TABLET | Freq: Once | ORAL | Status: AC
  Administered 2014-04-02: 4.5 mg via ORAL
  Filled 2014-04-02: qty 1

## 2014-04-02 MED ORDER — ACYCLOVIR 200 MG PO CAPS
400.0000 mg | ORAL_CAPSULE | Freq: Three times a day (TID) | ORAL | Status: DC
  Administered 2014-04-02 – 2014-04-03 (×4): 400 mg via ORAL
  Filled 2014-04-02 (×4): qty 2

## 2014-04-02 MED ORDER — DARBEPOETIN ALFA-POLYSORBATE 100 MCG/0.5ML IJ SOLN
100.0000 ug | Freq: Once | INTRAMUSCULAR | Status: AC
  Administered 2014-04-02: 100 ug via SUBCUTANEOUS
  Filled 2014-04-02: qty 0.5

## 2014-04-02 NOTE — Progress Notes (Addendum)
PULMONARY / CRITICAL CARE MEDICINE  Name: Bryan Hubbard MRN: [id number] DOB: 1940/11/24    ADMISSION DATE:  03/30/2014 CONSULTATION DATE:  03/31/2014  REFERRING MD :  EDP PRIMARY SERVICE:  PCCM  CHIEF COMPLAINT:  Hypoxemic respiratory failure  BRIEF PATIENT DESCRIPTION: 73 yo with Goodpasture's syndrome presenting on 7/3 with hypoxemic respiratory failure and interstitial process on chest CT.  SIGNIFICANT EVENTS / STUDIES:  7/3  Chest CT >>> Bilateral interstitial lung abnormality without nodules or consolidation, no effusion  LINES / TUBES:  CULTURES:  ANTIBIOTICS: Cefepime 7/3 >>> 7/5 Levaquin 7/3 >>> Vancomycin 7/3 >>> 7/5  INTERVAL HISTORY:   Little change in SOB Feels VERY weak  VITAL SIGNS: Temp:  [97.7 F (36.5 C)-99.7 F (37.6 C)] 97.8 F (36.6 C) (07/06 0800) Pulse Rate:  [42-87] 44 (07/06 0700) Resp:  [15-29] 19 (07/06 0700) BP: (78-112)/(56-73) 108/73 mmHg (07/06 0700) SpO2:  [89 %-99 %] 96 % (07/06 0700) Weight:  [63.1 kg (139 lb 1.8 oz)] 63.1 kg (139 lb 1.8 oz) (07/06 0400)  HEMODYNAMICS:   VENTILATOR SETTINGS:   INTAKE / OUTPUT: Intake/Output     07/05 0701 - 07/06 0700 07/06 0701 - 07/07 0700   P.O. 480    I.V. (mL/kg) 247.8 (3.9)    IV Piggyback     Total Intake(mL/kg) 727.8 (11.5)    Urine (mL/kg/hr) 1890 (1.2)    Total Output 1890     Net -1162.2           PHYSICAL EXAMINATION: General:  Comfortable Neuro:  Awake, alert HEENT:  PERRL, erythematous lesion R nare, ? herpetic Cardiovascular:  Regular, heart rate controlled, flutter on monitor Lungs:  Bilateral air entry, few scattered wheezes Abdomen:  Soft, nontender, bowel sounds diminished Musculoskeletal:  Moves all extremities, no edema Skin:  Intact  LABS: CBC  Recent Labs Lab 03/31/14 0212 04/01/14 0612 04/02/14 0010  WBC 3.5* 3.6* 3.9*  HGB 8.1* 8.0* 7.4*  HCT 23.9* 23.6* 21.5*  PLT 224 194 192   Coag's  Recent Labs Lab 03/31/14 0100 04/01/14 0612  04/02/14 0010  INR 1.38 2.08* 2.22*   BMET  Recent Labs Lab 03/31/14 0212 04/01/14 0612 04/02/14 0010  NA 132* 136* 133*  K 4.0 4.0 4.1  CL 97 100 99  CO2 24 23 20   BUN 28* 26* 27*  CREATININE 1.60* 1.38* 1.29  GLUCOSE 85 95 168*   Electrolytes  Recent Labs Lab 03/31/14 0212 04/01/14 0612 04/01/14 0835 04/02/14 0010  CALCIUM 8.6 9.1  --  9.5  MG 1.6  --  1.7  --   PHOS 3.4  --   --   --    Sepsis Markers  Recent Labs Lab 03/30/14 2043 03/31/14 0047 04/02/14 0010  LATICACIDVEN 0.84  --   --   PROCALCITON  --  0.18 0.14   ABG No results found for this basename: PHART, PCO2ART, PO2ART,  in the last 168 hours  Liver Enzymes  Recent Labs Lab 03/30/14 2015 03/31/14 0212 04/01/14 0612  AST 24 27 32  ALT 37 35 41  ALKPHOS 54 52 52  BILITOT 0.9 0.8 0.5  ALBUMIN 2.5* 2.4* 2.3*   Cardiac Enzymes  Recent Labs Lab 03/30/14 2015  TROPONINI <0.30  PROBNP 7170.0*   Glucose No results found for this basename: GLUCAP,  in the last 168 hours  IMAGING: No results found. ASSESSMENT / PLAN:  Hypoxemic respiratory failure, multifactorial, improving. 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, diffuse alveolar hemorrhage, Goodpasture's syndrome > appearance on CT scan inconsistent with flare of vasculitis, but must consider as a contributor Pulmonary hypertension; PAP by TTE 3/27 45 torr Immunosuppressed / on chronic Prednisone / cyclophosphamide   Supplemental oxygen for SpO2>92, wean as able  Will need ambient air SpO2 and if normal, ambulating SpO2 before d/c  Continue levaquin, would transition to Po when able and complete a 7 day course  Would consider treating for opportunistic / viral processes > consider HSV; will add  acyclovir empirically on 7/6 and follow  Sputum culture / respiratory viral panel if able to produce > he may need FOB to get cx data. Explained the procedure to him 7/6, he would be willing to perform if it would be helpful  Decreased Prednisone to 20 mg daily as preadmission, should be ok now to restart cyclophosphamide > follow clinically; unclear to me based on CT scan appearance, renal status, course that this represents flaring Goodpasture's. Could consider pulsed solumedrol if no clinical improvement or if CXR evolves infiltrates, nodular changes  Would like to diurese if BP will allow.   Continue Coumadin  Flutter management per Cardiology (mildly hypotensive with controlled rate on Cardizem gtt, changed to 30 mg PO q6h)  Plan for ablation after several weeks of anticoagulation; ? Whether he would benefit from TEE and sooner ablation, particularly if we believe RVR was inciting factor for this decompensation.   I have personally obtained history, examined patient, evaluated and interpreted laboratory and imaging results, reviewed medical records, formulated assessment / plan and placed orders.  Baltazar Apo, MD, PhD 04/02/2014, 10:54 AM [facility] [phone] or if no answer [phone]

## 2014-04-02 NOTE — Progress Notes (Signed)
ANTICOAGULATION CONSULT NOTE - Follow Up Consult  Pharmacy Consult for warfarin Indication: aflutter, afib, h/o VTE  No Known Allergies  Patient Measurements: Height: 5\' 10"  (177.8 cm) Weight: 139 lb 1.8 oz (63.1 kg) IBW/kg (Calculated) : 73 Heparin Dosing Weight:   Vital Signs: Temp: 97.8 F (36.6 C) (07/06 0800) Temp src: Oral (07/06 0800) BP: 108/73 mmHg (07/06 0700) Pulse Rate: 44 (07/06 0700)  Labs:  Recent Labs  03/30/14 2015 03/31/14 0100 03/31/14 0212 03/31/14 2100 04/01/14 0612 04/02/14 0010  HGB 8.3*  --  8.1*  --  8.0* 7.4*  HCT 24.3*  --  23.9*  --  23.6* 21.5*  PLT 236  --  224  --  194 192  LABPROT  --  17.0*  --   --  23.4* 24.6*  INR  --  1.38  --   --  2.08* 2.22*  HEPARINUNFRC  --   --   --  0.14* 0.19*  --   CREATININE 1.54*  --  1.60*  --  1.38* 1.29  TROPONINI <0.30  --   --   --   --   --     Estimated Creatinine Clearance: 44.8 ml/min (by C-G formula based on Cr of 1.29).  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 Pt on coumadin PTA for AFib/h/o PE. INR was subtherapeutic, so heparin was started 7/4 until INR therapeutic.  PTA warfarin dosage 4.5mg  daily INR is therapeutic (2.22) and rate of INR rise improved this am.  CBC: Hgb decreased to 7.4, pltc stable and WNL Drug interaction: Levaquin  Goal of Therapy:  INR 2-3   Plan:   Warfarin 4.5mg  po x 1 tonight (= home dose)  Watch for drug interaction from levofloxacin   Daily  INR   Doreene Eland, PharmD, BCPS.   Pager: [phone]  04/02/2014,11:04 AM

## 2014-04-02 NOTE — Progress Notes (Signed)
Patient ID: Bryan Hubbard, male   DOB: 15-Jul-1941, 73 y.o.   MRN: [id number]    SUBJECTIVE:  The patient is stable this morning. Family members in the room. Echo revealed ejection fraction of 50-55%. His IV diltiazem was changed to oral. Unfortunately his blood pressure has been on the low side. His oral diltiazem will be held for systolic less than 324. Once he is stabilized completely over time, Dr. Lovena Le hopes to proceed with atrial flutter ablation.   Filed Vitals:   04/02/14 0400 04/02/14 0430 04/02/14 0500 04/02/14 0627  BP: 78/56 92/57 99/59  93/71  Pulse: 66 49 58   Temp: 97.7 F (36.5 C)     TempSrc: Oral     Resp: 17 15 17    Height:      Weight: 139 lb 1.8 oz (63.1 kg)     SpO2: 95% 94% 98%      Intake/Output Summary (Last 24 hours) at 04/02/14 0738 Last data filed at 04/02/14 0400  Gross per 24 hour  Intake 647.83 ml  Output   1890 ml  Net -1242.17 ml    LABS: Basic Metabolic Panel:  Recent Labs  03/30/14 2015 03/31/14 0212 04/01/14 0612 04/01/14 0835 04/02/14 0010  NA 129* 132* 136*  --  133*  K 4.3 4.0 4.0  --  4.1  CL 93* 97 100  --  99  CO2 23 24 23   --  20  GLUCOSE 90 85 95  --  168*  BUN 27* 28* 26*  --  27*  CREATININE 1.54* 1.60* 1.38*  --  1.29  CALCIUM 8.7 8.6 9.1  --  9.5  MG  --  1.6  --  1.7  --   PHOS  --  3.4  --   --   --    Liver Function Tests:  Recent Labs  03/31/14 0212 04/01/14 0612  AST 27 32  ALT 35 41  ALKPHOS 52 52  BILITOT 0.8 0.5  PROT 5.5* 5.5*  ALBUMIN 2.4* 2.3*   No results found for this basename: LIPASE, AMYLASE,  in the last 72 hours CBC:  Recent Labs  03/30/14 2015  04/01/14 0612 04/02/14 0010  WBC 4.2  < > 3.6* 3.9*  NEUTROABS 3.4  --   --   --   HGB 8.3*  < > 8.0* 7.4*  HCT 24.3*  < > 23.6* 21.5*  MCV 96.4  < > 97.1 96.4  PLT 236  < > 194 192  < > = values in this interval not displayed. Cardiac Enzymes:  Recent Labs  03/30/14 2015  TROPONINI <0.30   BNP: No components found with this  basename: POCBNP,  D-Dimer: No results found for this basename: DDIMER,  in the last 72 hours Hemoglobin A1C: No results found for this basename: HGBA1C,  in the last 72 hours Fasting Lipid Panel: No results found for this basename: CHOL, HDL, LDLCALC, TRIG, CHOLHDL, LDLDIRECT,  in the last 72 hours Thyroid Function Tests:  Recent Labs  03/31/14 0212  TSH 1.240    RADIOLOGY: Dg Chest 2 View  03/30/2014   CLINICAL DATA:  Several month history of shortness of breath with history of CHF.  EXAM: CHEST  2 VIEW  COMPARISON:  Portable chest x-ray of March 16, 2014  FINDINGS: The lungs are well-expanded. The interstitial markings are mildly prominent. The cardiac silhouette is top-normal in size. The pulmonary vascularity is not clearly engorged. There is no pleural effusion. The bony thorax is unremarkable.  IMPRESSION: Mildly increased interstitial markings may reflect interstitial pneumonia but mild CHF may produce similar findings. There is no classic alveolar pneumonia.   Electronically Signed   By: David  Martinique   On: 03/30/2014 18:17   Ct Chest Wo Contrast  03/30/2014   CLINICAL DATA:  Shortness of breath  EXAM: CT CHEST WITHOUT CONTRAST  TECHNIQUE: Multidetector CT imaging of the chest was performed following the standard protocol without IV contrast.  COMPARISON:  Recent chest radiography, including 03/16/2014  FINDINGS: THORACIC INLET/BODY WALL:  No acute abnormality.  MEDIASTINUM:  No cardiomegaly. Multi focal coronary artery atherosclerosis. Small volume of fat along the right side of the interventricular septum which could be from remote infarct. No pericardial effusion. Prominent mediastinal lymph nodes, some containing coarse calcification. Right hilar nodes are also calcified. The largest node is in the sub- carina region measuring 16 mm short axis. No acute vascular findings. The main pulmonary artery appears normal, but the right pulmonary artery appears mildly dilated, equivocal for  pulmonary hypertension.  LUNG WINDOWS:  There is diffuse but mildly basilar predominant subpleural reticulation. Lung volumes remain normal. No consolidation or suspicious nodularity. Fissures are mildly distorted, suggesting fibrotic changes, but no honeycombing noted. No pleural effusion.  UPPER ABDOMEN:  No acute findings.  OSSEOUS:  No acute fracture.  No suspicious lytic or blastic lesions.  IMPRESSION: Diffuse interstitial lung abnormality. While an acute atypical infection is a diagnostic possibility, there has been persistent interstitial abnormality over multiple months by chest radiography. 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 The current abnormality is not typical of alveolar hemorrhage, but recommend correlation with chest CT imaging from time of diagnosis.   Electronically Signed   By: Jorje Guild M.D.   On: 03/30/2014 23:33   Dg Chest Port 1 View  03/16/2014   CLINICAL DATA:  Atrial fibrillation with rapid ventricular response, began vomiting last night, shortness of breath, worse with exertion, intermittent dizziness, past history hyperlipidemia, hypertension, asthma, Goodpasture syndrome, chronic diastolic CHF  EXAM: PORTABLE CHEST - 1 VIEW  COMPARISON:  Portable exam 1808 hr compared to 12/29/2013  FINDINGS: Enlargement of cardiac silhouette with pulmonary vascular congestion.  Mediastinal contours normal.  Chronic accentuation of interstitial markings in the mid to lower lungs bilaterally, similar to previous exam.  No segmental consolidation, pleural effusion or pneumothorax.  Bones diffusely demineralized.  Iodide positioning with slight rotation.  IMPRESSION: Enlargement of cardiac silhouette with pulmonary vascular congestion.  Persistent interstitial prominence in the mid to lower lungs could represent minimal recurrent edema or developing chronic interstitial lung disease/fibrosis.    Electronically Signed   By: Lavonia Dana M.D.   On: 03/16/2014 18:27    PHYSICAL EXAM  he is comfortable in bed. Lungs reveal scattered rhonchi. He is oriented to person time and place. Affect is normal. Family member is sleeping in the room. Cardiac exam reveals S1 and S2. Abdomen is soft. There is no peripheral edema.   TELEMETRY: I have personally reviewed telemetry today April 02, 2014. There is atrial flutter. The rates range from 95-110.   ASSESSMENT AND PLAN:     HYPERTENSION, BENIGn    CAP (community acquired pneumonia)      He is being treated for this with antibiotics.    Anemia of chronic disease     Goodpasture's disease, treated with steroid, oral cytoxan and plasmapheresis         The patient is on chronic steroids and other  medications for this.    Acute on chronic diastolic HF (heart failure)       His volume status appears to be stable at this time.    CKD (chronic kidney disease) stage 3, GFR 30-59 ml/min   Sepsis    Atrial flutter with rapid ventricular response      His blood pressure is too low to push his medications any more vigorously. We may have to allow his rate to be in the low 100s until he recovers completely from his other medical problems. He is anticoagulated. Ultimately it is hoped that he could have a flutter ablation.    Ejection fraction     Ejection fraction is 50-55% this admission.    Warfarin anticoagulation      Coumadin is to be continued.  The overall cardiology plan for now is to continue to help titrate medications to help with his rate control in the setting of low blood pressure. Once he is stabilized completely, and he has been anticoagulated for several weeks, Dr. Lovena Le may proceed with a flutter ablation.   Dola Argyle 04/02/2014 7:38 AM

## 2014-04-02 NOTE — Progress Notes (Signed)
PROGRESS NOTE  Bryan Hubbard:[id number] DOB: Oct 23, 1940 DOA: 03/30/2014 PCP: [name], MD  HPI: Bryan Hubbard is a 73 y.o. male with A flutter, recently diagnosed Goodpasture sd admitted with fevers, shortness f breath, A flutter with RVR  Assessment/Plan: Acute hypoxic respiratory failure - due to ?HCAP vs interstitial lung disease, oxygen as needed, continue supportive treatment. Pulmonology consulted, appreciate input.  - at rest patient is comfortable however relates to me that on ambulation to the bathroom and back he is pretty dyspneic and desats easily. Discussed with Dr. Lamonte Sakai his case today, appreciate consultation.  - this is somewhat multifactorial with lung disease, component of diastolic dysfunction, rapid heart rate and anemia.   A flutter with RVR - was on Metoprolol 75 BID but had side effects as an outpatient and his dose was decreased to 50 BID.  - cardiology consulted, appreciate input, now on oral dilt which is intermittently held due to hypotension and metop - discussed his case with Dr. Ron Parker this morning, appreciate input  HCAP - with fever this certainly possible, some atypical appearance on the chest CT,  - continue current regimen, consider discontinuation of antibiotics 24-48 h if cultures remain negative or narrow to levofloxacin only  CKD III - Cr at baseline, good UOP. Avoid nephrotoxins.   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 Posey Pronto this morning, will go ahead and give Epo while hospitalized, put in for a dose this morning. Monitor, may need transfusion soon, probably chase with Lasix if so.  Diet: low sodium Fluids: none DVT Prophylaxis: Coumadin  Code Status: Full Family Communication: d/w wife  Disposition Plan: inpatient  Consultants:  Cardiology   PCCM  Procedures:  None    Antibiotics Vancomycin 7/3 >>  7/5 Cefepime 7/3 >> 7/5 Levofloxacin 7/4 >> Acyclovir 7/6 >>   HPI/Subjective: Feeling better, dyspnea better, no chest pain  Objective: Filed Vitals:   04/02/14 0600 04/02/14 0627 04/02/14 0700 04/02/14 0800  BP: 93/71 93/71 108/73   Pulse: 42  44   Temp:    97.8 F (36.6 C)  TempSrc:    Oral  Resp: 17  19   Height:      Weight:      SpO2: 97%  96%     Intake/Output Summary (Last 24 hours) at 04/02/14 1120 Last data filed at 04/02/14 0700  Gross per 24 hour  Intake 447.83 ml  Output   1440 ml  Net -992.17 ml   Filed Weights   03/30/14 1949 04/01/14 0400 04/02/14 0400  Weight: 79.833 kg (176 lb) 80.6 kg (177 lb 11.1 oz) 63.1 kg (139 lb 1.8 oz)    Exam:  General:  NAD  Cardiovascular: irregular, tachycardic  Respiratory: faint rales, no wheezing, moves air well   Abdomen: soft, not tender to palpation, positive bowel sounds  MSK: no peripheral edema  Neuro: non focal   Data Reviewed: Basic Metabolic Panel:  Recent Labs Lab 03/30/14 2015 03/31/14 0212 04/01/14 0612 04/01/14 0835 04/02/14 0010  NA 129* 132* 136*  --  133*  K 4.3 4.0 4.0  --  4.1  CL 93* 97 100  --  99  CO2 23 24 23   --  20  GLUCOSE 90 85 95  --  168*  BUN 27* 28* 26*  --  27*  CREATININE 1.54* 1.60* 1.38*  --  1.29  CALCIUM 8.7 8.6 9.1  --  9.5  MG  --  1.6  --  1.7  --   PHOS  --  3.4  --   --   --    Liver Function Tests:  Recent Labs Lab 03/30/14 2015 03/31/14 0212 04/01/14 0612  AST 24 27 32  ALT 37 35 41  ALKPHOS 54 52 52  BILITOT 0.9 0.8 0.5  PROT 5.5* 5.5* 5.5*  ALBUMIN 2.5* 2.4* 2.3*   CBC:  Recent Labs Lab 03/30/14 2015 03/31/14 0212 04/01/14 0612 04/02/14 0010  WBC 4.2 3.5* 3.6* 3.9*  NEUTROABS 3.4  --   --   --   HGB 8.3* 8.1* 8.0* 7.4*  HCT 24.3* 23.9* 23.6* 21.5*  MCV 96.4 97.6 97.1 96.4  PLT 236 224 194 192   Cardiac Enzymes:  Recent Labs Lab 03/30/14 2015  TROPONINI <0.30   BNP (last 3 results)  Recent Labs  12/18/13 1635  03/05/14 0938 03/30/14 2015  PROBNP 5296.0* 573.0* 7170.0*   Recent Results (from the past 240 hour(s))  CULTURE, BLOOD (ROUTINE X 2)     Status: None   Collection Time    03/30/14  8:15 PM      Result Value Ref Range Status   Specimen Description BLOOD LEFT ANTECUBITAL   Final   Special Requests BOTTLES DRAWN AEROBIC AND ANAEROBIC 3CC   Final   Culture  Setup Time     Final   Value: 03/31/2014 02:17     Performed at [facility]   Culture     Final   Value:        BLOOD CULTURE RECEIVED NO GROWTH TO DATE CULTURE WILL BE HELD FOR 5 DAYS BEFORE ISSUING A FINAL NEGATIVE REPORT     Performed at [facility]   Report Status PENDING   Incomplete  CULTURE, BLOOD (ROUTINE X 2)     Status: None   Collection Time    03/30/14  8:20 PM      Result Value Ref Range Status   Specimen Description BLOOD RIGHT FOREARM   Final   Special Requests BOTTLES DRAWN AEROBIC AND ANAEROBIC 5CC   Final   Culture  Setup Time     Final   Value: 03/31/2014 02:19     Performed at [facility]   Culture     Final   Value:        BLOOD CULTURE RECEIVED NO GROWTH TO DATE CULTURE WILL BE HELD FOR 5 DAYS BEFORE ISSUING A FINAL NEGATIVE REPORT     Performed at [facility]   Report Status PENDING   Incomplete  URINE CULTURE     Status: None   Collection Time    03/30/14  9:00 PM      Result Value Ref Range Status   Specimen Description URINE, CLEAN CATCH   Final   Special Requests NONE   Final   Culture  Setup Time     Final   Value: 03/31/2014 02:21     Performed at [place]     Final   Value: NO GROWTH     Performed at [facility]   Culture     Final   Value: NO GROWTH     Performed at [facility]   Report Status 04/01/2014 FINAL   Final  MRSA PCR SCREENING     Status: None   Collection Time    03/31/14 12:49 AM      Result  Value Ref Range Status   MRSA by PCR NEGATIVE  NEGATIVE Final   Comment:            The GeneXpert  MRSA Assay (FDA     approved for NASAL specimens     only), is one component of a     comprehensive MRSA colonization     surveillance program. It is not     intended to diagnose MRSA     infection nor to guide or     monitor treatment for     MRSA infections.     Studies: No results found.  Scheduled Meds: . acyclovir  400 mg Oral TID  . cyclobenzaprine  5 mg Oral QHS  . cyclophosphamide  100 mg Oral Daily  . darbepoetin (ARANESP) injection - NON-DIALYSIS  100 mcg Subcutaneous Once  . diltiazem  30 mg Oral 4 times per day  . docusate sodium  100 mg Oral BID  . dutasteride  0.5 mg Oral Daily   And  . tamsulosin  0.4 mg Oral Daily  . feeding supplement (ENSURE COMPLETE)  237 mL Oral BID BM  . latanoprost  1 drop Both Eyes Daily  . levofloxacin (LEVAQUIN) IV  750 mg Intravenous Q48H  . metoprolol  50 mg Oral BID  . montelukast  10 mg Oral QHS  . pantoprazole  40 mg Oral Daily  . predniSONE  20 mg Oral Q breakfast  . sodium chloride  3 mL Intravenous Q12H  . sodium chloride  3 mL Intravenous Q12H  . warfarin  4.5 mg Oral ONCE-1800  . Warfarin - Pharmacist Dosing Inpatient   Does not apply q1800   Continuous Infusions:    Active Problems:   HYPERTENSION, BENIGN   CAP (community acquired pneumonia)   Anemia of chronic disease    Goodpasture's disease, treated with steroid, oral cytoxan and plasmapheresis     Acute on chronic diastolic HF (heart failure) in combination with rapid a flutter and anemia   CKD (chronic kidney disease) stage 3, GFR 30-59 ml/min   Sepsis   Atrial flutter with rapid ventricular response   Ejection fraction   Warfarin anticoagulation  Time spent: 35  This note has been created with Surveyor, quantity. Any transcriptional errors are unintentional.   Marzetta Board, MD [facility] Pager [phone]. If 7 PM - 7 AM, please contact night-coverage at [email or web address], password [facility] 04/02/2014, 11:20 AM   LOS: 3 days

## 2014-04-02 NOTE — Progress Notes (Addendum)
Patient ID: Bryan Hubbard, male   DOB: [birth year], 73 y.o.   MRN: [id number]  I have spoken with Dr. Lovena Le about the patient's atrial flutter. He reminds me that the patient has actually been in atrial flutter since at least March. Over this period of time he has tolerated it without SOB. . It is possible that his current illness has caused increased heart rate with his atrial flutter. This may play a role with how he feels. However, it seems unlikely that his atrial flutter is the primary issue causing his marked shortness of breath. We will continue to follow the patient closely with you. However, it still seems that we need to find and treat the underlying problem that is causing his respiratory difficulty. We're not convinced as of today that this is his atrial flutter. Therefore cardioversion attempt or very early atrial flutter ablation is not recommended as of today as the next step. If he has CHF with volume overload, it would certainly be appropriate to diuresis him. However, without clearcut evidence of volume overload,  I am hesitant to recommend diuresis today, especially in the face of hypotension.  When patient is seen on Tuesday AM, our team  will also consider adding digoxin for further rate control. It could help with rate control without affecting blood pressure.  Daryel November, MD

## 2014-04-03 ENCOUNTER — Other Ambulatory Visit (HOSPITAL_COMMUNITY): Payer: Self-pay | Admitting: Respiratory Therapy

## 2014-04-03 ENCOUNTER — Inpatient Hospital Stay (HOSPITAL_COMMUNITY): Payer: Medicare Other

## 2014-04-03 DIAGNOSIS — D638 Anemia in other chronic diseases classified elsewhere: Secondary | ICD-10-CM

## 2014-04-03 DIAGNOSIS — I5032 Chronic diastolic (congestive) heart failure: Secondary | ICD-10-CM

## 2014-04-03 DIAGNOSIS — N059 Unspecified nephritic syndrome with unspecified morphologic changes: Secondary | ICD-10-CM

## 2014-04-03 DIAGNOSIS — Z7901 Long term (current) use of anticoagulants: Secondary | ICD-10-CM

## 2014-04-03 DIAGNOSIS — N183 Chronic kidney disease, stage 3 unspecified: Secondary | ICD-10-CM

## 2014-04-03 DIAGNOSIS — M310 Hypersensitivity angiitis: Secondary | ICD-10-CM

## 2014-04-03 LAB — COMPREHENSIVE METABOLIC PANEL
ALT: 59 U/L — ABNORMAL HIGH (ref 0–53)
AST: 36 U/L (ref 0–37)
Albumin: 2.2 g/dL — ABNORMAL LOW (ref 3.5–5.2)
Alkaline Phosphatase: 57 U/L (ref 39–117)
Anion gap: 11 (ref 5–15)
BUN: 25 mg/dL — ABNORMAL HIGH (ref 6–23)
CO2: 25 meq/L (ref 19–32)
CREATININE: 1.25 mg/dL (ref 0.50–1.35)
Calcium: 9.9 mg/dL (ref 8.4–10.5)
Chloride: 100 mEq/L (ref 96–112)
GFR, EST AFRICAN AMERICAN: 64 mL/min — AB (ref >90)
GFR, EST NON AFRICAN AMERICAN: 55 mL/min — AB (ref >90)
Glucose, Bld: 97 mg/dL (ref 70–99)
Potassium: 5 mEq/L (ref 3.7–5.3)
Sodium: 136 mEq/L — ABNORMAL LOW (ref 137–147)
TOTAL PROTEIN: 5.5 g/dL — AB (ref 6.0–8.3)
Total Bilirubin: 0.3 mg/dL (ref 0.3–1.2)

## 2014-04-03 LAB — CBC
HCT: 23.5 % — ABNORMAL LOW (ref 39.0–52.0)
Hemoglobin: 7.9 g/dL — ABNORMAL LOW (ref 13.0–17.0)
MCH: 32.8 pg (ref 26.0–34.0)
MCHC: 33.6 g/dL (ref 30.0–36.0)
MCV: 97.5 fL (ref 78.0–100.0)
Platelets: 204 10*3/uL (ref 150–400)
RBC: 2.41 MIL/uL — AB (ref 4.22–5.81)
RDW: 20.6 % — AB (ref 11.5–15.5)
WBC: 3.4 10*3/uL — ABNORMAL LOW (ref 4.0–10.5)

## 2014-04-03 LAB — PREPARE RBC (CROSSMATCH)

## 2014-04-03 LAB — PROTIME-INR
INR: 1.9 — ABNORMAL HIGH (ref 0.00–1.49)
Prothrombin Time: 21.8 seconds — ABNORMAL HIGH (ref 11.6–15.2)

## 2014-04-03 MED ORDER — METHYLPREDNISOLONE SODIUM SUCC 125 MG IJ SOLR
250.0000 mg | Freq: Four times a day (QID) | INTRAMUSCULAR | Status: AC
  Administered 2014-04-03 – 2014-04-06 (×11): 250 mg via INTRAVENOUS
  Filled 2014-04-03 (×13): qty 4

## 2014-04-03 MED ORDER — TECHNETIUM TC 99M DIETHYLENETRIAME-PENTAACETIC ACID: 46.0000 | Freq: Once | INTRAVENOUS | Status: AC | PRN

## 2014-04-03 MED ORDER — TECHNETIUM TO 99M ALBUMIN AGGREGATED
6.0000 | Freq: Once | INTRAVENOUS | Status: AC | PRN
  Administered 2014-04-03: 6 via INTRAVENOUS

## 2014-04-03 MED ORDER — ACYCLOVIR 400 MG PO TABS
400.0000 mg | ORAL_TABLET | Freq: Three times a day (TID) | ORAL | Status: DC
  Administered 2014-04-03 – 2014-04-06 (×9): 400 mg via ORAL
  Filled 2014-04-03 (×12): qty 1

## 2014-04-03 MED ORDER — WARFARIN SODIUM 5 MG PO TABS
5.0000 mg | ORAL_TABLET | Freq: Once | ORAL | Status: AC
  Administered 2014-04-03: 5 mg via ORAL
  Filled 2014-04-03 (×2): qty 1

## 2014-04-03 NOTE — Progress Notes (Signed)
PROGRESS NOTE  Bryan Hubbard KXF:[id number] DOB: [birth year] DOA: 03/30/2014 PCP: [name], MD  HPI: Bryan Hubbard is a 73 y.o. male with A flutter, recently diagnosed Goodpasture sd admitted with fevers, shortness f breath, A flutter with RVR.   Interim history: On admission CT scan showed diffuse interstitial changes ?atypical infection. Pulmonology and Cardiology consulted for his dyspnea and his A flutter with RVR.  Assessment/Plan: Acute hypoxic respiratory failure - due to ?HCAP vs interstitial lung disease, oxygen as needed, continue supportive treatment. Pulmonology consulted, appreciate input.  - at rest patient is comfortable however relates to me that on ambulation to the bathroom and back he is pretty short of breath - this is somewhat multifactorial with lung disease possible infectious in etiology, component of diastolic dysfunction, rapid heart rate and anemia.  - currently receiving antibiotics, steroids, transfusion today and is rate controlled per cardiology.  A flutter with RVR - was on Metoprolol 75 BID but had side effects as an outpatient and his dose was decreased to 50 BID.  - cardiology consulted, appreciate input, per cardiology his A flutter is somewhat stable and since it has been present for few months unlikely to be a major contributor to his dyspnea and he can have the ablation in few weeks as planned.  - rate better this morning as his blood pressure is tolerating now his nodal blocking agents. Transfer to telemetry later today if stable.   HCAP - with fever this certainly possible, some atypical appearance on the chest CT,  - continue Levofloxacin and Acyclovir. - afebrile  CKD III - Cr at baseline, good UOP. Avoid nephrotoxins.   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 Posey Pronto 7/6, patient received Epo 7/6 while hospitalized - contributes  to some extent to his dyspnea - will transfuse 1U today and monitor respiratory status, may need a second unit. Will hold on administering Lasix for now given intermittent hypotension and need for Diltiazem and Metoprolol.   Diet: low sodium Fluids: none DVT Prophylaxis: Coumadin  Code Status: Full Family Communication: d/w wife  Disposition Plan: inpatient, transfer to telemetry later today  Consultants:  Cardiology   PCCM  Procedures:  None    Antibiotics Vancomycin 7/3 >> 7/5 Cefepime 7/3 >> 7/5 Levofloxacin 7/4 >> Acyclovir 7/6 >>   HPI/Subjective: Continues to feel short of breath and as he is talking O2 sats dip below 90  Objective: Filed Vitals:   04/03/14 0400 04/03/14 0500 04/03/14 0600 04/03/14 0800  BP: 110/61 102/61 109/71 131/71  Pulse: 44 87 66 56  Temp: 98 F (36.7 C)     TempSrc: Oral     Resp: 19   22  Height:      Weight: 65.1 kg (143 lb 8.3 oz)     SpO2: 96%   92%    Intake/Output Summary (Last 24 hours) at 04/03/14 0911 Last data filed at 04/03/14 0400  Gross per 24 hour  Intake      0 ml  Output   2675 ml  Net  -2675 ml   Filed Weights   04/01/14 0400 04/02/14 0400 04/03/14 0400  Weight: 80.6 kg (177 lb 11.1 oz) 63.1 kg (139 lb 1.8 oz) 65.1 kg (143 lb 8.3 oz)   Exam:  General:  NAD  Cardiovascular: irregular, tachycardic  Respiratory: faint rales, no wheezing, moves air well   Abdomen: soft, not tender to  palpation, positive bowel sounds  MSK: no peripheral edema  Neuro: non focal   Data Reviewed: Basic Metabolic Panel:  Recent Labs Lab 03/30/14 2015 03/31/14 0212 04/01/14 0612 04/01/14 0835 04/02/14 0010 04/03/14 0344  NA 129* 132* 136*  --  133* 136*  K 4.3 4.0 4.0  --  4.1 5.0  CL 93* 97 100  --  99 100  CO2 23 24 23   --  20 25  GLUCOSE 90 85 95  --  168* 97  BUN 27* 28* 26*  --  27* 25*  CREATININE 1.54* 1.60* 1.38*  --  1.29 1.25  CALCIUM 8.7 8.6 9.1  --  9.5 9.9  MG  --  1.6  --  1.7  --   --   PHOS   --  3.4  --   --   --   --    Liver Function Tests:  Recent Labs Lab 03/30/14 2015 03/31/14 0212 04/01/14 0612 04/03/14 0344  AST 24 27 32 36  ALT 37 35 41 59*  ALKPHOS 54 52 52 57  BILITOT 0.9 0.8 0.5 0.3  PROT 5.5* 5.5* 5.5* 5.5*  ALBUMIN 2.5* 2.4* 2.3* 2.2*   CBC:  Recent Labs Lab 03/30/14 2015 03/31/14 0212 04/01/14 0612 04/02/14 0010 04/03/14 0344  WBC 4.2 3.5* 3.6* 3.9* 3.4*  NEUTROABS 3.4  --   --   --   --   HGB 8.3* 8.1* 8.0* 7.4* 7.9*  HCT 24.3* 23.9* 23.6* 21.5* 23.5*  MCV 96.4 97.6 97.1 96.4 97.5  PLT 236 224 194 192 204   Cardiac Enzymes:  Recent Labs Lab 03/30/14 2015  TROPONINI <0.30   BNP (last 3 results)  Recent Labs  12/18/13 1635 03/05/14 0938 03/30/14 2015  PROBNP 5296.0* 573.0* 7170.0*   Recent Results (from the past 240 hour(s))  CULTURE, BLOOD (ROUTINE X 2)     Status: None   Collection Time    03/30/14  8:15 PM      Result Value Ref Range Status   Specimen Description BLOOD LEFT ANTECUBITAL   Final   Special Requests BOTTLES DRAWN AEROBIC AND ANAEROBIC 3CC   Final   Culture  Setup Time     Final   Value: 03/31/2014 02:17     Performed at [facility]   Culture     Final   Value:        BLOOD CULTURE RECEIVED NO GROWTH TO DATE CULTURE WILL BE HELD FOR 5 DAYS BEFORE ISSUING A FINAL NEGATIVE REPORT     Performed at [facility]   Report Status PENDING   Incomplete  CULTURE, BLOOD (ROUTINE X 2)     Status: None   Collection Time    03/30/14  8:20 PM      Result Value Ref Range Status   Specimen Description BLOOD RIGHT FOREARM   Final   Special Requests BOTTLES DRAWN AEROBIC AND ANAEROBIC 5CC   Final   Culture  Setup Time     Final   Value: 03/31/2014 02:19     Performed at [facility]   Culture     Final   Value:        BLOOD CULTURE RECEIVED NO GROWTH TO DATE CULTURE WILL BE HELD FOR 5 DAYS BEFORE ISSUING A FINAL NEGATIVE REPORT     Performed at [facility]   Report Status PENDING    Incomplete  URINE CULTURE     Status: None   Collection Time  03/30/14  9:00 PM      Result Value Ref Range Status   Specimen Description URINE, CLEAN CATCH   Final   Special Requests NONE   Final   Culture  Setup Time     Final   Value: 03/31/2014 02:21     Performed at [facility]     Final   Value: NO GROWTH     Performed at [facility]   Culture     Final   Value: NO GROWTH     Performed at [facility]   Report Status 04/01/2014 FINAL   Final  MRSA PCR SCREENING     Status: None   Collection Time    03/31/14 12:49 AM      Result Value Ref Range Status   MRSA by PCR NEGATIVE  NEGATIVE Final   Comment:            The GeneXpert MRSA Assay (FDA     approved for NASAL specimens     only), is one component of a     comprehensive MRSA colonization     surveillance program. It is not     intended to diagnose MRSA     infection nor to guide or     monitor treatment for     MRSA infections.     Studies: No results found.  Scheduled Meds: . acyclovir  400 mg Oral TID  . cyclobenzaprine  5 mg Oral QHS  . cyclophosphamide  100 mg Oral Daily  . diltiazem  30 mg Oral 4 times per day  . docusate sodium  100 mg Oral BID  . dutasteride  0.5 mg Oral Daily   And  . tamsulosin  0.4 mg Oral Daily  . feeding supplement (ENSURE COMPLETE)  237 mL Oral BID BM  . latanoprost  1 drop Both Eyes Daily  . levofloxacin (LEVAQUIN) IV  750 mg Intravenous Q48H  . metoprolol  50 mg Oral BID  . montelukast  10 mg Oral QHS  . pantoprazole  40 mg Oral Daily  . predniSONE  20 mg Oral Q breakfast  . sodium chloride  3 mL Intravenous Q12H  . sodium chloride  3 mL Intravenous Q12H  . Warfarin - Pharmacist Dosing Inpatient   Does not apply q1800   Continuous Infusions:    Active Problems:   HYPERTENSION, BENIGN   CAP (community acquired pneumonia)   Anemia of chronic disease    Goodpasture's disease, treated with steroid, oral cytoxan and  plasmapheresis     Acute on chronic diastolic HF (heart failure) in combination with rapid a flutter and anemia   CKD (chronic kidney disease) stage 3, GFR 30-59 ml/min   Sepsis   Atrial flutter with rapid ventricular response   Ejection fraction   Warfarin anticoagulation  Time spent: 35  This note has been created with Surveyor, quantity. Any transcriptional errors are unintentional.   Marzetta Board, MD [facility] Pager [phone]. If 7 PM - 7 AM, please contact night-coverage at [email or web address], password [facility] 04/03/2014, 9:11 AM  LOS: 4 days

## 2014-04-03 NOTE — Progress Notes (Signed)
ANTIBIOTIC CONSULT NOTE - follow-up  Pharmacy Consult for Levaquin Indication: HCAP  No Known Allergies  Patient Measurements: Height: 5\' 10"  (177.8 cm) Weight: 143 lb 8.3 oz (65.1 kg) IBW/kg (Calculated) : 73   Vital Signs: Temp: 98 F (36.7 C) (07/07 0400) Temp src: Oral (07/07 0400) BP: 90/67 mmHg (07/07 1100) Pulse Rate: 88 (07/07 1100) Intake/Output from previous day: 07/06 0701 - 07/07 0700 In: -  Out: 2675 [Urine:2675] Intake/Output from this shift:    Labs:  Recent Labs  04/01/14 0612 04/02/14 0010 04/03/14 0344  WBC 3.6* 3.9* 3.4*  HGB 8.0* 7.4* 7.9*  PLT 194 192 204  CREATININE 1.38* 1.29 1.25   Estimated Creatinine Clearance: 47.7 ml/min (by C-G formula based on Cr of 1.25). No results found for this basename: VANCOTROUGH, VANCOPEAK, VANCORANDOM, [place], GENTPEAK, GENTRANDOM, TOBRATROUGH, TOBRAPEAK, TOBRARND, AMIKACINPEAK, AMIKACINTROU, AMIKACIN,  in the last 72 hours   Microbiology: Recent Results (from the past 720 hour(s))  CULTURE, BLOOD (ROUTINE X 2)     Status: None   Collection Time    03/30/14  8:15 PM      Result Value Ref Range Status   Specimen Description BLOOD LEFT ANTECUBITAL   Final   Special Requests BOTTLES DRAWN AEROBIC AND ANAEROBIC 3CC   Final   Culture  Setup Time     Final   Value: 03/31/2014 02:17     Performed at [facility]   Culture     Final   Value:        BLOOD CULTURE RECEIVED NO GROWTH TO DATE CULTURE WILL BE HELD FOR 5 DAYS BEFORE ISSUING A FINAL NEGATIVE REPORT     Performed at [facility]   Report Status PENDING   Incomplete  CULTURE, BLOOD (ROUTINE X 2)     Status: None   Collection Time    03/30/14  8:20 PM      Result Value Ref Range Status   Specimen Description BLOOD RIGHT FOREARM   Final   Special Requests BOTTLES DRAWN AEROBIC AND ANAEROBIC 5CC   Final   Culture  Setup Time     Final   Value: 03/31/2014 02:19     Performed at [facility]   Culture     Final   Value:         BLOOD CULTURE RECEIVED NO GROWTH TO DATE CULTURE WILL BE HELD FOR 5 DAYS BEFORE ISSUING A FINAL NEGATIVE REPORT     Performed at [facility]   Report Status PENDING   Incomplete  URINE CULTURE     Status: None   Collection Time    03/30/14  9:00 PM      Result Value Ref Range Status   Specimen Description URINE, CLEAN CATCH   Final   Special Requests NONE   Final   Culture  Setup Time     Final   Value: 03/31/2014 02:21     Performed at [facility]     Final   Value: NO GROWTH     Performed at [facility]   Culture     Final   Value: NO GROWTH     Performed at [facility]   Report Status 04/01/2014 FINAL   Final  MRSA PCR SCREENING     Status: None   Collection Time    03/31/14 12:49 AM      Result Value Ref Range Status   MRSA by PCR NEGATIVE  NEGATIVE Final   Comment:            The GeneXpert MRSA Assay (FDA     approved for NASAL specimens     only), is one component of a     comprehensive MRSA colonization     surveillance program. It is not     intended to diagnose MRSA     infection nor to guide or     monitor treatment for     MRSA infections.    Medical History: Past Medical History  Diagnosis Date  . Atrial flutter started in 2011  . Chronic diastolic heart failure   . DVT   . HYPERLIPIDEMIA   . HYPERTENSION, BENIGN   . ALLERGIC ASTHMA   . Palpitations   . H/O Goodpasture's syndrome     "autoimmune disease; affects kidneys and lungs"  . DVT (deep venous thrombosis) 09/2001    "right groin"  . PE (pulmonary embolism) 09/2001  . CHF (congestive heart failure)   . Anemia   . History of blood transfusion     "related to Goodpasture's syndrome; plasmapheresis process"  . Stomach ulcer   . Arthritis     "hands, knees" (03/16/2014)  . Gout   . Renal insufficiency     "related to the Goodpasture's"  . Atrial flutter with rapid ventricular response 03/16/2014  . Acute on chronic diastolic HF (heart  failure) in combination with rapid a flutter and anemia 03/18/2014  . Ejection fraction     Medications:  Scheduled:  . acyclovir  400 mg Oral TID  . cyclobenzaprine  5 mg Oral QHS  . cyclophosphamide  100 mg Oral Daily  . diltiazem  30 mg Oral 4 times per day  . docusate sodium  100 mg Oral BID  . dutasteride  0.5 mg Oral Daily   And  . tamsulosin  0.4 mg Oral Daily  . feeding supplement (ENSURE COMPLETE)  237 mL Oral BID BM  . latanoprost  1 drop Both Eyes Daily  . levofloxacin (LEVAQUIN) IV  750 mg Intravenous Q48H  . metoprolol  50 mg Oral BID  . montelukast  10 mg Oral QHS  . pantoprazole  40 mg Oral Daily  . predniSONE  20 mg Oral Q breakfast  . sodium chloride  3 mL Intravenous Q12H  . sodium chloride  3 mL Intravenous Q12H  . warfarin  5 mg Oral ONCE-1800  . Warfarin - Pharmacist Dosing Inpatient   Does not apply q1800   Infusions:    Assessment: 50 yoM with hx of goodpastures disease and A-fib on chronic cytoxan (oral) presents with weakness, fever and dizziness.  Vancomycin and Cefepime started for for HCAP.  Antibiotics narrowed to levofloxacin alone  7/3 >> cefepime >>7/5 7/3 >> vancomycin >> 7/5  7/4 >> levaquin >> 7/6 >> PO acylovir (MD) >>  Tmax: afeb WBCs: low CKD III: SCr improved, CrCl 48CG  7/3 blood: NGtd 7/3 urine: NGF 7/4 urine strep: neg 7/4 urine legionella:  7/4 MRSA PCR neg  Drug level / dose changes info:    Goal of Therapy:  Vancomycin trough level 15-20 mcg/ml  Plan:  Day #4 levofloxacin (Day #5 total days antibiotic)  Continue levofloxacin 750gm IV q48h for CrCl < 11ml/min  Doreene Eland, PharmD, BCPS.   Pager: [phone]  04/03/2014,11:08 AM

## 2014-04-03 NOTE — Progress Notes (Signed)
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  SIGNIFICANT EVENTS / STUDIES:  7/3  Chest CT >>> Bilateral interstitial lung abnormality without nodules or consolidation, no effusion 7/7  VQ scan >>> 7/7  PFT >>> 7/7  Pulse steroids strated  LINES / TUBES:  CULTURES:  ANTIBIOTICS: Cefepime 7/3 >>> 7/5 Levaquin 7/3 >>> Vancomycin 7/3 >>> 7/5 Acyclovir 7/6 >>>  INTERVAL HISTORY:  Feels tired.  Dyspnea at baseline.  VITAL SIGNS: Temp:  [97.9 F (36.6 C)-98.8 F (37.1 C)] 98.4 F (36.9 C) (07/07 1140) Pulse Rate:  [44-113] 86 (07/07 1140) Resp:  [17-25] 24 (07/07 1140) BP: (90-131)/(52-87) 107/63 mmHg (07/07 1140) SpO2:  [92 %-99 %] 94 % (07/07 1140) Weight:  [65.1 kg (143 lb 8.3 oz)] 65.1 kg (143 lb 8.3 oz) (07/07 0400)  HEMODYNAMICS:   VENTILATOR SETTINGS:   INTAKE / OUTPUT: Intake/Output     07/06 0701 - 07/07 0700 07/07 0701 - 07/08 0700   P.O.     I.V. (mL/kg)     Blood  12.5   Total Intake(mL/kg)  12.5 (0.2)   Urine (mL/kg/hr) 2675 (1.7)    Total Output 2675     Net -2675 +12.5         PHYSICAL EXAMINATION: General:  Resting in no distress Neuro:  Awake, alert HEENT:  PERRL Cardiovascular:  A-flutter Lungs:  NO added sounds, bilateral air entry Abdomen:  Soft, nontender, bowel sounds diminished Musculoskeletal:  No pedal edema Skin:  Intact  LABS: CBC  Recent Labs Lab 04/01/14 0612 04/02/14 0010 04/03/14 0344  WBC 3.6* 3.9* 3.4*  HGB 8.0* 7.4* 7.9*  HCT 23.6* 21.5* 23.5*  PLT 194 192 204   Coag's  Recent Labs Lab 04/01/14 0612 04/02/14 0010 04/03/14 0344  INR 2.08* 2.22* 1.90*    BMET  Recent Labs Lab 04/01/14 0612 04/02/14 0010 04/03/14 0344  NA 136* 133* 136*  K 4.0 4.1 5.0  CL 100 99 100  CO2 23 20 25   BUN 26* 27* 25*  CREATININE 1.38* 1.29 1.25  GLUCOSE 95 168* 97   Electrolytes  Recent Labs Lab 03/31/14 0212 04/01/14 0612 04/01/14 0835 04/02/14 0010 04/03/14 0344  CALCIUM 8.6 9.1  --  9.5 9.9  MG 1.6  --  1.7  --   --   PHOS 3.4  --   --   --   --    Sepsis Markers  Recent Labs Lab 03/30/14 2043 03/31/14 0047 04/02/14 0010  LATICACIDVEN 0.84  --   --   PROCALCITON  --  0.18 0.14   ABG No results found for this basename: PHART, PCO2ART, PO2ART,  in the last 168 hours  Liver Enzymes  Recent Labs Lab 03/31/14 0212 04/01/14 0612 04/03/14 0344  AST 27 32 36  ALT 35 41 59*  ALKPHOS 52 52 57  BILITOT 0.8 0.5 0.3  ALBUMIN 2.4* 2.3* 2.2*   Cardiac Enzymes  Recent Labs Lab 03/30/14 2015  TROPONINI <0.30  PROBNP 7170.0*   Glucose No results found for this basename: GLUCAP,  in the last 168 hours  IMAGING: No results found.  ASSESSMENT / PLAN:  Hypoxemic respiratory failure, multifactorial. Significant dyspnea ( one  flight of stairs ) has been present since Dx of Goodpasture's syndrome in March.  There were no significant respiratory symptoms prior to March, including back in 2011 when he was told he has congestive heart failure and atrial fibrillation. 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 Unlikely NSIP, diffuse alveolar hemorrhage.   Start pulse steroids, Solu-Medrol 250 mg IV q6h x 3 days  Possible pneumonia initially suspected based on fever, abnormal chest imaging and dyspnea.  Of not,  dyspnea and parenchymal changes were present before this admission, essentially since March. Viral / PCP pneumonia less likely.   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  This make the possibility of acute PE less likely, but it is still possible and of concern given that the degree of parenchymal involvement does not match the degree of dyspnea / hypoxemia.    Continue Coumadin   VQ scan  Pulmonary hypertension; PAP by TTE 3/27 45 torr.  Chronic diastolic heart failure. There is no evidence of acute component.  Of note, there were no significant respiratory symptoms when he was initially diagnosed in 2011.   No indications for diuresis at this time  Atrial fibrillation / flutter.    Cardiology following  Metoprolol, Cardizem  Ablation eventually, but it seems there is no reason for urgent cardioversion  Anemia.   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  I have personally obtained history, examined patient, evaluated and interpreted laboratory and imaging results, reviewed medical records, formulated assessment / plan and placed orders.  Doree Fudge, MD [facility] [place] Pager: [phone]  04/03/2014, 12:29 PM

## 2014-04-03 NOTE — Progress Notes (Signed)
ANTICOAGULATION CONSULT NOTE - Follow Up Consult  Pharmacy Consult for warfarin Indication: aflutter, afib, h/o VTE  No Known Allergies  Patient Measurements: Height: 5\' 10"  (177.8 cm) Weight: 143 lb 8.3 oz (65.1 kg) IBW/kg (Calculated) : 73 Heparin Dosing Weight:   Vital Signs: Temp: 98 F (36.7 C) (07/07 0400) Temp src: Oral (07/07 0400) BP: 131/71 mmHg (07/07 0800) Pulse Rate: 56 (07/07 0800)  Labs:  Recent Labs  03/31/14 2100  04/01/14 0612 04/02/14 0010 04/03/14 0344  HGB  --   < > 8.0* 7.4* 7.9*  HCT  --   --  23.6* 21.5* 23.5*  PLT  --   --  194 192 204  LABPROT  --   --  23.4* 24.6* 21.8*  INR  --   --  2.08* 2.22* 1.90*  HEPARINUNFRC 0.14*  --  0.19*  --   --   CREATININE  --   --  1.38* 1.29 1.25  < > = values in this interval not displayed.  Estimated Creatinine Clearance: 47.7 ml/min (by C-G formula based on Cr of 1.25).  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 Pt on coumadin PTA for AFib/h/o PE. INR was subtherapeutic, so heparin was started 7/4 until INR therapeutic.  PTA warfarin dosage 4.5mg  daily INR is subtherapeutic (1.9)  -  Cause of decrease may be start of Ensure (vit K) CBC: Hgb and pltc stable and WNL Drug interaction: Levaquin  Goal of Therapy:  INR 2-3   Plan:   Warfarin 5mg  po x 1 tonight (slight boost)  Watch for drug interaction from levofloxacin   Daily  INR   Doreene Eland, PharmD, BCPS.   Pager: [phone]  04/03/2014,11:00 AM

## 2014-04-03 NOTE — Progress Notes (Signed)
Subjective:  No CP/SOB, very aware and in tune with his current medical situation  Objective:  Temp:  [97.9 F (36.6 C)-98.7 F (37.1 C)] 98 F (36.7 C) (07/07 0400) Pulse Rate:  [44-136] 66 (07/07 0600) Resp:  [17-23] 19 (07/07 0400) BP: (93-153)/(52-88) 109/71 mmHg (07/07 0600) SpO2:  [92 %-99 %] 96 % (07/07 0400) Weight:  [143 lb 8.3 oz (65.1 kg)] 143 lb 8.3 oz (65.1 kg) (07/07 0400) Weight change: 4 lb 6.6 oz (2 kg)  Intake/Output from previous day: 07/06 0701 - 07/07 0700 In: -  Out: 2675 [Urine:2675]  Intake/Output from this shift:    Physical Exam: General appearance: alert and no distress Neck: no adenopathy, no carotid bruit, no JVD, supple, symmetrical, trachea midline and thyroid not enlarged, symmetric, no tenderness/mass/nodules Lungs: clear to auscultation bilaterally Heart: irregularly irregular rhythm Extremities: extremities normal, atraumatic, no cyanosis or edema  Lab Results: Results for orders placed during the hospital encounter of 03/30/14 (from the past 48 hour(s))  MAGNESIUM     Status: None   Collection Time    04/01/14  8:35 AM      Result Value Ref Range   Magnesium 1.7  1.5 - 2.5 mg/dL  PROCALCITONIN     Status: None   Collection Time    04/02/14 12:10 AM      Result Value Ref Range   Procalcitonin 0.14     Comment:            Interpretation:     PCT (Procalcitonin) <= 0.5 ng/mL:     Systemic infection (sepsis) is not likely.     Local bacterial infection is possible.     (NOTE)             ICU PCT Algorithm               Non ICU PCT Algorithm        ----------------------------     ------------------------------             PCT < 0.25 ng/mL                 PCT < 0.1 ng/mL         Stopping of antibiotics            Stopping of antibiotics           strongly encouraged.               strongly encouraged.        ----------------------------     ------------------------------           PCT level decrease by               PCT <  0.25 ng/mL           >= 80% from peak PCT           OR PCT 0.25 - 0.5 ng/mL          Stopping of antibiotics                                                 encouraged.         Stopping of antibiotics               encouraged.        ----------------------------     ------------------------------  PCT level decrease by              PCT >= 0.25 ng/mL           < 80% from peak PCT            AND PCT >= 0.5 ng/mL            Continuing antibiotics                                                  encouraged.           Continuing antibiotics                encouraged.        ----------------------------     ------------------------------         PCT level increase compared          PCT > 0.5 ng/mL             with peak PCT AND              PCT >= 0.5 ng/mL             Escalation of antibiotics                                              strongly encouraged.          Escalation of antibiotics            strongly encouraged.  PROTIME-INR     Status: Abnormal   Collection Time    04/02/14 12:10 AM      Result Value Ref Range   Prothrombin Time 24.6 (*) 11.6 - 15.2 seconds   INR 2.22 (*) 0.00 - 1.49  CBC     Status: Abnormal   Collection Time    04/02/14 12:10 AM      Result Value Ref Range   WBC 3.9 (*) 4.0 - 10.5 K/uL   RBC 2.23 (*) 4.22 - 5.81 MIL/uL   Hemoglobin 7.4 (*) 13.0 - 17.0 g/dL   HCT 21.5 (*) 39.0 - 52.0 %   MCV 96.4  78.0 - 100.0 fL   MCH 33.2  26.0 - 34.0 pg   MCHC 34.4  30.0 - 36.0 g/dL   RDW 20.8 (*) 11.5 - 15.5 %   Platelets 192  150 - 400 K/uL  BASIC METABOLIC PANEL     Status: Abnormal   Collection Time    04/02/14 12:10 AM      Result Value Ref Range   Sodium 133 (*) 137 - 147 mEq/L   Potassium 4.1  3.7 - 5.3 mEq/L   Chloride 99  96 - 112 mEq/L   CO2 20  19 - 32 mEq/L   Glucose, Bld 168 (*) 70 - 99 mg/dL   BUN 27 (*) 6 - 23 mg/dL   Creatinine, Ser 1.29  0.50 - 1.35 mg/dL   Calcium 9.5  8.4 - 10.5 mg/dL   GFR calc non Af Amer 53 (*) >90 mL/min    GFR calc Af Amer 61 (*) >90 mL/min   Comment: (NOTE)     The eGFR has been calculated using the CKD EPI equation.  This calculation has not been validated in all clinical situations.     eGFR's persistently <90 mL/min signify possible Chronic Kidney     Disease.   Anion gap 14  5 - 15  PROTIME-INR     Status: Abnormal   Collection Time    04/03/14  3:44 AM      Result Value Ref Range   Prothrombin Time 21.8 (*) 11.6 - 15.2 seconds   INR 1.90 (*) 0.00 - 1.49  CBC     Status: Abnormal   Collection Time    04/03/14  3:44 AM      Result Value Ref Range   WBC 3.4 (*) 4.0 - 10.5 K/uL   RBC 2.41 (*) 4.22 - 5.81 MIL/uL   Hemoglobin 7.9 (*) 13.0 - 17.0 g/dL   HCT 09.3 (*) 81.8 - 29.9 %   MCV 97.5  78.0 - 100.0 fL   MCH 32.8  26.0 - 34.0 pg   MCHC 33.6  30.0 - 36.0 g/dL   RDW 37.1 (*) 69.6 - 78.9 %   Platelets 204  150 - 400 K/uL  COMPREHENSIVE METABOLIC PANEL     Status: Abnormal   Collection Time    04/03/14  3:44 AM      Result Value Ref Range   Sodium 136 (*) 137 - 147 mEq/L   Potassium 5.0  3.7 - 5.3 mEq/L   Comment: DELTA CHECK NOTED     REPEATED TO VERIFY   Chloride 100  96 - 112 mEq/L   CO2 25  19 - 32 mEq/L   Glucose, Bld 97  70 - 99 mg/dL   BUN 25 (*) 6 - 23 mg/dL   Creatinine, Ser 3.81  0.50 - 1.35 mg/dL   Calcium 9.9  8.4 - 01.7 mg/dL   Total Protein 5.5 (*) 6.0 - 8.3 g/dL   Albumin 2.2 (*) 3.5 - 5.2 g/dL   AST 36  0 - 37 U/L   ALT 59 (*) 0 - 53 U/L   Alkaline Phosphatase 57  39 - 117 U/L   Total Bilirubin 0.3  0.3 - 1.2 mg/dL   GFR calc non Af Amer 55 (*) >90 mL/min   GFR calc Af Amer 64 (*) >90 mL/min   Comment: (NOTE)     The eGFR has been calculated using the CKD EPI equation.     This calculation has not been validated in all clinical situations.     eGFR's persistently <90 mL/min signify possible Chronic Kidney     Disease.   Anion gap 11  5 - 15    Imaging: Imaging results have been reviewed  Assessment/Plan:   1. Active Problems: 2.    HYPERTENSION, BENIGN 3.   CAP (community acquired pneumonia) 4.   Anemia of chronic disease 5.    Goodpasture's disease, treated with steroid, oral cytoxan and plasmapheresis   6.   Acute on chronic diastolic HF (heart failure) in combination with rapid a flutter and anemia 7.   CKD (chronic kidney disease) stage 3, GFR 30-59 ml/min 8.   Sepsis 9.   Atrial flutter with rapid ventricular response 10.   Ejection fraction 11.   Warfarin anticoagulation 12.   Time Spent Directly with Patient:  30 minutes  Length of Stay:  LOS: 4 days   Pt admitted with SOB and increased temp. He is a pt of Dr. Eldridge Dace and Ladona Ridgel with a H/O Afib in past and Aflutter currently. He has a diagnosis of Goodpasture's  Disease and has an interstitial pattern on Chest CT (Pulmonary following). 2D nl LV fxn. His BNP was elevated prob related to diastolic dysfunction but lungs clear and renal fxn has normalized. He is on chronic coumadin A/C secondary to remote PE (IVC filter) and afib with therapeutic INRs. At this point I'm not convinced that his aflutter is a major contributor to his SOB. I think that the increased VR is physiologic secondary to profound anemia (would benefit from Tx PRBCs shooting for a HGB >10) and hypoxia. He is on BB and CCB with SBP 130 this AM and HR 90-100. Will hold off on Dig as brought up appropriately by Dr. Ron Parker and will follow with you.   Lorretta Harp 04/03/2014, 8:12 AM

## 2014-04-04 ENCOUNTER — Inpatient Hospital Stay (HOSPITAL_COMMUNITY): Payer: Medicare Other

## 2014-04-04 ENCOUNTER — Other Ambulatory Visit: Payer: Self-pay

## 2014-04-04 DIAGNOSIS — A419 Sepsis, unspecified organism: Secondary | ICD-10-CM | POA: Diagnosis not present

## 2014-04-04 DIAGNOSIS — I5033 Acute on chronic diastolic (congestive) heart failure: Secondary | ICD-10-CM | POA: Diagnosis not present

## 2014-04-04 DIAGNOSIS — J9600 Acute respiratory failure, unspecified whether with hypoxia or hypercapnia: Secondary | ICD-10-CM | POA: Diagnosis not present

## 2014-04-04 DIAGNOSIS — J189 Pneumonia, unspecified organism: Secondary | ICD-10-CM | POA: Diagnosis not present

## 2014-04-04 DIAGNOSIS — I2699 Other pulmonary embolism without acute cor pulmonale: Secondary | ICD-10-CM

## 2014-04-04 DIAGNOSIS — M79609 Pain in unspecified limb: Secondary | ICD-10-CM

## 2014-04-04 DIAGNOSIS — M7989 Other specified soft tissue disorders: Secondary | ICD-10-CM

## 2014-04-04 LAB — PULMONARY FUNCTION TEST
DL/VA % PRED: 67 %
DL/VA: 3.11 ml/min/mmHg/L
DLCO UNC % PRED: 33 %
DLCO UNC: 10.93 ml/min/mmHg
DLCO cor % pred: 41 %
DLCO cor: 13.42 ml/min/mmHg
FEF 25-75 Post: 2.67 L/sec
FEF 25-75 Pre: 1.96 L/sec
FEF2575-%Change-Post: 35 %
FEF2575-%Pred-Post: 117 %
FEF2575-%Pred-Pre: 86 %
FEV1-%CHANGE-POST: 9 %
FEV1-%PRED-POST: 73 %
FEV1-%Pred-Pre: 67 %
FEV1-Post: 2.28 L
FEV1-Pre: 2.08 L
FEV1FVC-%Change-Post: -3 %
FEV1FVC-%Pred-Pre: 111 %
FEV6-%Change-Post: 12 %
FEV6-%PRED-POST: 71 %
FEV6-%PRED-PRE: 63 %
FEV6-POST: 2.88 L
FEV6-Pre: 2.56 L
FEV6FVC-%CHANGE-POST: 0 %
FEV6FVC-%PRED-POST: 106 %
FEV6FVC-%PRED-PRE: 106 %
FVC-%Change-Post: 13 %
FVC-%PRED-PRE: 60 %
FVC-%Pred-Post: 67 %
FVC-POST: 2.9 L
FVC-PRE: 2.56 L
Post FEV1/FVC ratio: 79 %
Post FEV6/FVC ratio: 100 %
Pre FEV1/FVC ratio: 81 %
Pre FEV6/FVC Ratio: 100 %
RV % pred: 82 %
RV: 2.09 L
TLC % pred: 69 %
TLC: 4.88 L

## 2014-04-04 LAB — GLUCOSE, CAPILLARY
Glucose-Capillary: 165 mg/dL — ABNORMAL HIGH (ref 70–99)
Glucose-Capillary: 241 mg/dL — ABNORMAL HIGH (ref 70–99)

## 2014-04-04 LAB — COMPREHENSIVE METABOLIC PANEL
ALT: 83 U/L — ABNORMAL HIGH (ref 0–53)
AST: 44 U/L — AB (ref 0–37)
Albumin: 2.4 g/dL — ABNORMAL LOW (ref 3.5–5.2)
Alkaline Phosphatase: 66 U/L (ref 39–117)
Anion gap: 15 (ref 5–15)
BUN: 31 mg/dL — ABNORMAL HIGH (ref 6–23)
CHLORIDE: 99 meq/L (ref 96–112)
CO2: 21 meq/L (ref 19–32)
CREATININE: 1.19 mg/dL (ref 0.50–1.35)
Calcium: 10.3 mg/dL (ref 8.4–10.5)
GFR calc Af Amer: 68 mL/min — ABNORMAL LOW (ref >90)
GFR, EST NON AFRICAN AMERICAN: 58 mL/min — AB (ref >90)
Glucose, Bld: 172 mg/dL — ABNORMAL HIGH (ref 70–99)
Potassium: 4.5 mEq/L (ref 3.7–5.3)
Sodium: 135 mEq/L — ABNORMAL LOW (ref 137–147)
Total Bilirubin: 0.3 mg/dL (ref 0.3–1.2)
Total Protein: 5.8 g/dL — ABNORMAL LOW (ref 6.0–8.3)

## 2014-04-04 LAB — TYPE AND SCREEN
ABO/RH(D): A NEG
Antibody Screen: NEGATIVE
Unit division: 0

## 2014-04-04 LAB — PROTIME-INR
INR: 2.15 — ABNORMAL HIGH (ref 0.00–1.49)
Prothrombin Time: 24 seconds — ABNORMAL HIGH (ref 11.6–15.2)

## 2014-04-04 LAB — CBC
HEMATOCRIT: 27.3 % — AB (ref 39.0–52.0)
HEMOGLOBIN: 9.4 g/dL — AB (ref 13.0–17.0)
MCH: 32 pg (ref 26.0–34.0)
MCHC: 34.4 g/dL (ref 30.0–36.0)
MCV: 92.9 fL (ref 78.0–100.0)
PLATELETS: 222 10*3/uL (ref 150–400)
RBC: 2.94 MIL/uL — AB (ref 4.22–5.81)
RDW: 21.2 % — ABNORMAL HIGH (ref 11.5–15.5)
WBC: 3.4 10*3/uL — AB (ref 4.0–10.5)

## 2014-04-04 LAB — PROCALCITONIN: Procalcitonin: <0.1 ng/mL

## 2014-04-04 MED ORDER — ALBUTEROL SULFATE (2.5 MG/3ML) 0.083% IN NEBU
2.5000 mg | INHALATION_SOLUTION | Freq: Once | RESPIRATORY_TRACT | Status: AC
  Administered 2014-04-04: 2.5 mg via RESPIRATORY_TRACT

## 2014-04-04 MED ORDER — WARFARIN SODIUM 4 MG PO TABS
4.0000 mg | ORAL_TABLET | Freq: Once | ORAL | Status: DC
  Filled 2014-04-04: qty 1

## 2014-04-04 MED ORDER — CYCLOPHOSPHAMIDE 25 MG PO CAPS
100.0000 mg | ORAL_CAPSULE | Freq: Every day | ORAL | Status: DC
  Administered 2014-04-05 – 2014-04-06 (×2): 100 mg via ORAL
  Filled 2014-04-04 (×3): qty 4

## 2014-04-04 MED ORDER — MOMETASONE FURO-FORMOTEROL FUM 100-5 MCG/ACT IN AERO
2.0000 | INHALATION_SPRAY | Freq: Two times a day (BID) | RESPIRATORY_TRACT | Status: DC
  Administered 2014-04-04 – 2014-04-06 (×5): 2 via RESPIRATORY_TRACT
  Filled 2014-04-04: qty 8.8

## 2014-04-04 MED ORDER — INSULIN ASPART 100 UNIT/ML ~~LOC~~ SOLN
0.0000 [IU] | Freq: Three times a day (TID) | SUBCUTANEOUS | Status: DC
  Administered 2014-04-04 – 2014-04-05 (×3): 3 [IU] via SUBCUTANEOUS
  Administered 2014-04-05: 2 [IU] via SUBCUTANEOUS
  Administered 2014-04-06: 5 [IU] via SUBCUTANEOUS
  Administered 2014-04-06: 2 [IU] via SUBCUTANEOUS

## 2014-04-04 MED ORDER — INSULIN ASPART 100 UNIT/ML ~~LOC~~ SOLN
0.0000 [IU] | Freq: Every day | SUBCUTANEOUS | Status: DC
  Administered 2014-04-04: 2 [IU] via SUBCUTANEOUS

## 2014-04-04 MED ORDER — LEVOFLOXACIN 750 MG PO TABS
750.0000 mg | ORAL_TABLET | ORAL | Status: AC
  Administered 2014-04-06: 750 mg via ORAL
  Filled 2014-04-04: qty 1

## 2014-04-04 MED ORDER — VITAMINS A & D EX OINT
TOPICAL_OINTMENT | CUTANEOUS | Status: AC
  Administered 2014-04-04: 23:00:00
  Filled 2014-04-04: qty 5

## 2014-04-04 NOTE — Progress Notes (Signed)
ANTICOAGULATION CONSULT NOTE - Follow Up Consult  Pharmacy Consult for warfarin Indication: aflutter, afib, h/o VTE  No Known Allergies  Patient Measurements: Height: 5\' 10"  (177.8 cm) Weight: 143 lb 8.3 oz (65.1 kg) IBW/kg (Calculated) : 73    Vital Signs: Temp: 98.2 F (36.8 C) (07/08 0510) Temp src: Oral (07/08 0510) BP: 115/79 mmHg (07/08 0510) Pulse Rate: 75 (07/08 0510)  Labs:  Recent Labs  04/02/14 0010 04/03/14 0344 04/04/14 0425  HGB 7.4* 7.9* 9.4*  HCT 21.5* 23.5* 27.3*  PLT 192 204 222  LABPROT 24.6* 21.8* 24.0*  INR 2.22* 1.90* 2.15*  CREATININE 1.29 1.25 1.19    Estimated Creatinine Clearance: 50.1 ml/min (by C-G formula based on Cr of 1.19).  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 Pt on coumadin PTA for AFib/h/o PE. INR was subtherapeutic, so heparin was started 7/4 until INR therapeutic.  PTA warfarin dosage 4.5mg  daily INR = 2.15, therapeutic CBC: Hgb improved and pltc stable and WNL Drug interaction: Levaquin started 7/4 (expect prolonged INR)  Goal of Therapy:  INR 2-3   Plan:   Warfarin 4 mg po x 1 tonight (decrease w/ Levaquin drug-drug interaction)  Daily  INR    Gretta Arab PharmD, BCPS Pager [phone] 04/04/2014 8:51 AM

## 2014-04-04 NOTE — Progress Notes (Signed)
ANTICOAGULATION CONSULT NOTE - Follow Up Consult  Pharmacy Consult for Apixaban (Eliquis) Indication: Aflutter, afib, h/o VTE  No Known Allergies  Patient Measurements: Height: 5\' 10"  (177.8 cm) Weight: 143 lb 8.3 oz (65.1 kg) IBW/kg (Calculated) : 73    Vital Signs: Temp: 98.2 F (36.8 C) (07/08 0510) Temp src: Oral (07/08 0510) BP: 117/75 mmHg (07/08 1028) Pulse Rate: 133 (07/08 1028)  Labs:  Recent Labs  04/02/14 0010 04/03/14 0344 04/04/14 0425  HGB 7.4* 7.9* 9.4*  HCT 21.5* 23.5* 27.3*  PLT 192 204 222  LABPROT 24.6* 21.8* 24.0*  INR 2.22* 1.90* 2.15*  CREATININE 1.29 1.25 1.19    Estimated Creatinine Clearance: 50.1 ml/min (by C-G formula based on Cr of 1.19).  Assessment: 73 yr male presents 7/3 with fever.  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  Pt on warfarin PTA for history of Afib and PE.  Pharmacy was asked to continue dosing of warfarin upon admission, with heparin drip until INR therapeutic.  On 7/8, Pharmacy consulted to dose apixaban (Eliquis) for new PE (clinically suspected d/t perfusion defect on VQ) and warfarin failure.  INR = 2.15, therapeutic Renal function: SCr 1.19 with CrCl ~ 50 ml/min.  With history of Goodpasture's syndrome though, he is at risk for renal decline.  Dr. Posey Pronto, his nephrologist,  recommends Eliquis. CBC: Hgb improved and pltc stable and WNL  Goal of Therapy:  INR 2-3   Plan:   D/C warfarin now.  When INR < 2, begin apixapan.  Apixaban 10mg  PO BID for 7 days, then 5mg  PO BID  Follow up renal function and CBC.   Gretta Arab PharmD, BCPS Pager [phone] 04/04/2014 1:03 PM

## 2014-04-04 NOTE — Progress Notes (Signed)
Assumed care of patient from previous RN at 23:00.  I agree with previous assessment and will continue to monitor patient. Owens Shark, Daniele Yankowski Cherie

## 2014-04-04 NOTE — Progress Notes (Signed)
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  SIGNIFICANT EVENTS: 7/3  Admit with hypoxemic respiratory failure, GGO on CT 7/7  Pulse steroids started  STUDIES:  7/3  Chest CT >>> bilateral interstitial lung abnormality without nodules or consolidation, no effusion 7/7  VQ scan >>> intermediate probability of PE, new acute perfusion defect LUL since prior VQ in 12/2013 7/7  PFT >>> restrictive pattern, severe diffusion impairment, significant bronchodilator responce 7/8  Venous duplex >>> 7/8  Converted form Coumadin to Eliquis  LINES / TUBES:  CULTURES:  ANTIBIOTICS: Cefepime 7/3 >>> 7/5 Levaquin 7/3 >>> Vancomycin 7/3 >>> 7/5 Acyclovir 7/6 >>>  INTERVAL HISTORY:    VITAL SIGNS: Temp:  [97.4 F (36.3 C)-98.8 F (37.1 C)] 98.2 F (36.8 C) (07/08 0510) Pulse Rate:  [36-138] 75 (07/08 0510) Resp:  [14-25] 20 (07/08 0510) BP: (80-127)/(48-93) 115/79 mmHg (07/08 0510) SpO2:  [86 %-99 %] 91 % (07/08 0510)  INTAKE / OUTPUT: Intake/Output     07/07 0701 - 07/08 0700 07/08 0701 - 07/09 0700   P.O. 240    Blood 267    Total Intake(mL/kg) 507 (7.8)    Urine (mL/kg/hr) 875 (0.6)    Total Output 875     Net -368          Urine Occurrence 8 x    Stool Occurrence 3 x     PHYSICAL EXAMINATION: General:  Resting in no distress Neuro:  Awake, alert, AME HEENT:  PERRL Cardiovascular:  A-flutter Lungs: Clear bilaterally, bilateral air entry Abdomen:  Soft, nontender, bowel sounds diminished Musculoskeletal:  No pedal edema Skin:  Intact  LABS: CBC  Recent Labs Lab 04/02/14 0010 04/03/14 0344 04/04/14 0425  WBC 3.9*  3.4* 3.4*  HGB 7.4* 7.9* 9.4*  HCT 21.5* 23.5* 27.3*  PLT 192 204 222   Coag's  Recent Labs Lab 04/02/14 0010 04/03/14 0344 04/04/14 0425  INR 2.22* 1.90* 2.15*   BMET  Recent Labs Lab 04/02/14 0010 04/03/14 0344 04/04/14 0425  NA 133* 136* 135*  K 4.1 5.0 4.5  CL 99 100 99  CO2 20 25 21   BUN 27* 25* 31*  CREATININE 1.29 1.25 1.19  GLUCOSE 168* 97 172*   Electrolytes  Recent Labs Lab 03/31/14 0212  04/01/14 0835 04/02/14 0010 04/03/14 0344 04/04/14 0425  CALCIUM 8.6  < >  --  9.5 9.9 10.3  MG 1.6  --  1.7  --   --   --   PHOS 3.4  --   --   --   --   --   < > = values in this interval not displayed. Sepsis Markers  Recent Labs Lab 03/30/14 2043 03/31/14 0047 04/02/14 0010 04/04/14 0425  LATICACIDVEN 0.84  --   --   --   PROCALCITON  --  0.18 0.14 <0.10   ABG No results found for this basename: PHART, PCO2ART, PO2ART,  in the last 168 hours  Liver Enzymes  Recent Labs Lab 04/01/14 0612 04/03/14 0344 04/04/14 0425  AST 32 36 44*  ALT 41 59* 83*  ALKPHOS 52 57 66  BILITOT 0.5 0.3 0.3  ALBUMIN 2.3* 2.2* 2.4*  Cardiac Enzymes  Recent Labs Lab 03/30/14 2015  TROPONINI <0.30  PROBNP 7170.0*   Glucose No results found for this basename: GLUCAP,  in the last 168 hours  IMAGING: Nm Pulmonary Perf And Vent  04/03/2014   CLINICAL DATA:  Shortness of breath.  EXAM: NUCLEAR MEDICINE VENTILATION - PERFUSION LUNG SCAN  TECHNIQUE: Ventilation images were obtained in multiple projections using inhaled aerosol technetium 99 M DTPA. Perfusion images were obtained in multiple projections after intravenous injection of Tc-68m MAA.  RADIOPHARMACEUTICALS:  46.0 mCi Tc-74m DTPA aerosol and 6.0 mCi Tc-48m MAA  COMPARISON:  Chest radiograph of March 30, 2014.  FINDINGS: Ventilation: No significant defect is noted.  Perfusion: Moderate to large defect is seen involving the apex of the left upper lobe with no corresponding ventilation defect. No corresponding  radiographic abnormality is noted either.  IMPRESSION: Intermediate probability of pulmonary embolus.   Electronically Signed   By: Sabino Dick M.D.   On: 04/03/2014 14:12    ASSESSMENT / PLAN:  Hypoxemic respiratory failure, multifactorial. Severe diffusion impairment on PFT. Significant dyspnea ( one flight of stairs ) has been present since Dx of Goodpasture's syndrome in March.  There were no significant respiratory symptoms prior to March, including back in 2011 when he was told he has congestive heart failure and atrial fibrillation. There was no significant change form baseline dyspnea on this admission; the symptoms were mainly fatigue and fever on admission.   Supplemental oxygen for SpO2>92, wean as able  Will need ambient air SpO2 and if normal, ambulating SpO2 before d/c  PFT to quantify pulmonary impairment - pt reports improvement in dyspnea with BD's.  Carries history of short term  smoking & lived with a smoker (total 30+ yrs exposure)  Recommend Pulmonary follow up as outpatient  Dulera + SABA for d/c given BD response on PFT's  Reactive airway disease.  PFT is not formally consistent with COPD exacerbation but FEV1/FVC ration can be falsely elevated due to coexisting restrictive / ILD disease.  There was a significant bronchodilator response. Also significant history of smoking.   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  PFT is consistent ( increased FEV1/FVC and severely impaired diffusion ). Goodpasture's syndrome, diagnosis of which corresponds to onset of dyspnea. Unlikely diffuse alveolar hemorrhage.   Pulse steroids started 7/7, Solu-Medrol 250 mg IV q6h x 3 days  Would convert to Prednisone 40-60 mg daily when finished  Possible pneumonia - initially suspected based on fever, abnormal chest imaging and dyspnea.  Of note, dyspnea and parenchymal changes were present before this admission, essentially since March. Viral / PCP pneumonia less likely.   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  Sub therapeutic INR on admission.  According to patient history of wide fluctuations in INR.  New perfusion defect on VQ, clinically PE is suspected as the degree of parenchymal involvement does not match the degree of dyspnea / hypoxemia.  Coumadin failure.   Start Eliquis ( novel anticoagulation agents were previously suggested as better choice by Cardiology - per  patient.   Discussed with Dr. Posey Pronto ( patient's Nephrologist ) - Eliquis would be the agent of choice.  D/c Coumadin when appropriate   LE Doppler  Case Mgr  to call insurance for coverage assessment of Eliquis given coumadin failure  Pulmonary hypertension; PAP by TTE 3/27 45 torr.  Chronic diastolic heart failure -  There is no evidence of acute component.  Of note, there were no significant respiratory symptoms when he was initially diagnosed in 2011.   No indications for diuresis at this time  Atrial fibrillation / flutter.     Cardiology following  Metoprolol, Cardizem  Ablation eventually, but it seems there is no reason for urgent cardioversion  Anemia.   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  Follow up appointment with Dr. Lake Bells July 29 th, 3:45 PM  Noe Gens, NP-C [facility] Pgr: [phone] or [phone]  04/04/2014, 10:05 AM  I have personally obtained history, examined patient, evaluated and interpreted laboratory and imaging results, reviewed medical records, formulated assessment / plan and placed orders.  Doree Fudge, MD [facility] [place] Pager: [phone]  04/04/2014, 12:25 PM

## 2014-04-04 NOTE — Progress Notes (Signed)
Patient ambulated on room air and sats dropped to 83%, sats quickly returned to 94% while at rest on room air. Will continue to monitor patient.

## 2014-04-04 NOTE — Progress Notes (Signed)
Patient's HR continues to sustain in 130s, stable BP and symptomatic. EKG confirmed A-flutter. Dr. Hartford Poli made aware, said to page cardiology for further recommendations. Spoke to Dr. Gwenlyn Found who said that because patient is in chronic a-flutter, HR will be elevated like this from time to time after certain activities. No further orders at this time.

## 2014-04-04 NOTE — Progress Notes (Signed)
Subjective:  Feels better after Tx 1 unit PRBCs  Objective:  Temp:  [97.4 F (36.3 C)-98.8 F (37.1 C)] 98.2 F (36.8 C) (07/08 0510) Pulse Rate:  [36-138] 75 (07/08 0510) Resp:  [14-25] 20 (07/08 0510) BP: (80-131)/(48-93) 115/79 mmHg (07/08 0510) SpO2:  [86 %-99 %] 91 % (07/08 0510) Weight change:   Intake/Output from previous day: 07/07 0701 - 07/08 0700 In: 507 [P.O.:240; Blood:267] Out: 875 [Urine:875]  Intake/Output from this shift: Total I/O In: 240 [P.O.:240] Out: 875 [Urine:875]  Physical Exam: General appearance: alert and no distress Neck: no adenopathy, no carotid bruit, no JVD, supple, symmetrical, trachea midline and thyroid not enlarged, symmetric, no tenderness/mass/nodules Lungs: clear to auscultation bilaterally Heart: irregularly irregular rhythm Extremities: extremities normal, atraumatic, no cyanosis or edema  Lab Results: Results for orders placed during the hospital encounter of 03/30/14 (from the past 48 hour(s))  PROTIME-INR     Status: Abnormal   Collection Time    04/03/14  3:44 AM      Result Value Ref Range   Prothrombin Time 21.8 (*) 11.6 - 15.2 seconds   INR 1.90 (*) 0.00 - 1.49  CBC     Status: Abnormal   Collection Time    04/03/14  3:44 AM      Result Value Ref Range   WBC 3.4 (*) 4.0 - 10.5 K/uL   RBC 2.41 (*) 4.22 - 5.81 MIL/uL   Hemoglobin 7.9 (*) 13.0 - 17.0 g/dL   HCT 23.5 (*) 39.0 - 52.0 %   MCV 97.5  78.0 - 100.0 fL   MCH 32.8  26.0 - 34.0 pg   MCHC 33.6  30.0 - 36.0 g/dL   RDW 20.6 (*) 11.5 - 15.5 %   Platelets 204  150 - 400 K/uL  COMPREHENSIVE METABOLIC PANEL     Status: Abnormal   Collection Time    04/03/14  3:44 AM      Result Value Ref Range   Sodium 136 (*) 137 - 147 mEq/L   Potassium 5.0  3.7 - 5.3 mEq/L   Comment: DELTA CHECK NOTED     REPEATED TO VERIFY   Chloride 100  96 - 112 mEq/L   CO2 25  19 - 32 mEq/L   Glucose, Bld 97  70 - 99 mg/dL   BUN 25 (*) 6 - 23 mg/dL   Creatinine, Ser 1.25  0.50  - 1.35 mg/dL   Calcium 9.9  8.4 - 10.5 mg/dL   Total Protein 5.5 (*) 6.0 - 8.3 g/dL   Albumin 2.2 (*) 3.5 - 5.2 g/dL   AST 36  0 - 37 U/L   ALT 59 (*) 0 - 53 U/L   Alkaline Phosphatase 57  39 - 117 U/L   Total Bilirubin 0.3  0.3 - 1.2 mg/dL   GFR calc non Af Amer 55 (*) >90 mL/min   GFR calc Af Amer 64 (*) >90 mL/min   Comment: (NOTE)     The eGFR has been calculated using the CKD EPI equation.     This calculation has not been validated in all clinical situations.     eGFR's persistently <90 mL/min signify possible Chronic Kidney     Disease.   Anion gap 11  5 - 15  TYPE AND SCREEN     Status: None   Collection Time    04/03/14 10:00 AM      Result Value Ref Range   ABO/RH(D) A NEG  Antibody Screen NEG     Sample Expiration 04/06/2014     Unit Number [id number]     Blood Component Type RBC LR PHER1     Unit division 00     Status of Unit ISSUED     Transfusion Status OK TO TRANSFUSE     Crossmatch Result Compatible    PREPARE RBC (CROSSMATCH)     Status: None   Collection Time    04/03/14 10:00 AM      Result Value Ref Range   Order Confirmation ORDER PROCESSED BY BLOOD BANK    PROCALCITONIN     Status: None   Collection Time    04/04/14  4:25 AM      Result Value Ref Range   Procalcitonin <0.10     Comment:            Interpretation:     PCT (Procalcitonin) <= 0.5 ng/mL:     Systemic infection (sepsis) is not likely.     Local bacterial infection is possible.     (NOTE)             ICU PCT Algorithm               Non ICU PCT Algorithm        ----------------------------     ------------------------------             PCT < 0.25 ng/mL                 PCT < 0.1 ng/mL         Stopping of antibiotics            Stopping of antibiotics           strongly encouraged.               strongly encouraged.        ----------------------------     ------------------------------           PCT level decrease by               PCT < 0.25 ng/mL           >= 80% from peak PCT             OR PCT 0.25 - 0.5 ng/mL          Stopping of antibiotics                                                 encouraged.         Stopping of antibiotics               encouraged.        ----------------------------     ------------------------------           PCT level decrease by              PCT >= 0.25 ng/mL           < 80% from peak PCT            AND PCT >= 0.5 ng/mL            Continuing antibiotics  encouraged.           Continuing antibiotics                encouraged.        ----------------------------     ------------------------------         PCT level increase compared          PCT > 0.5 ng/mL             with peak PCT AND              PCT >= 0.5 ng/mL             Escalation of antibiotics                                              strongly encouraged.          Escalation of antibiotics            strongly encouraged.  PROTIME-INR     Status: Abnormal   Collection Time    04/04/14  4:25 AM      Result Value Ref Range   Prothrombin Time 24.0 (*) 11.6 - 15.2 seconds   INR 2.15 (*) 0.00 - 1.49  CBC     Status: Abnormal   Collection Time    04/04/14  4:25 AM      Result Value Ref Range   WBC 3.4 (*) 4.0 - 10.5 K/uL   RBC 2.94 (*) 4.22 - 5.81 MIL/uL   Hemoglobin 9.4 (*) 13.0 - 17.0 g/dL   HCT 27.3 (*) 39.0 - 52.0 %   MCV 92.9  78.0 - 100.0 fL   MCH 32.0  26.0 - 34.0 pg   MCHC 34.4  30.0 - 36.0 g/dL   RDW 21.2 (*) 11.5 - 15.5 %   Platelets 222  150 - 400 K/uL  COMPREHENSIVE METABOLIC PANEL     Status: Abnormal   Collection Time    04/04/14  4:25 AM      Result Value Ref Range   Sodium 135 (*) 137 - 147 mEq/L   Potassium 4.5  3.7 - 5.3 mEq/L   Chloride 99  96 - 112 mEq/L   CO2 21  19 - 32 mEq/L   Glucose, Bld 172 (*) 70 - 99 mg/dL   BUN 31 (*) 6 - 23 mg/dL   Creatinine, Ser 1.19  0.50 - 1.35 mg/dL   Calcium 10.3  8.4 - 10.5 mg/dL   Total Protein 5.8 (*) 6.0 - 8.3 g/dL   Albumin 2.4 (*) 3.5 - 5.2 g/dL    AST 44 (*) 0 - 37 U/L   ALT 83 (*) 0 - 53 U/L   Alkaline Phosphatase 66  39 - 117 U/L   Total Bilirubin 0.3  0.3 - 1.2 mg/dL   GFR calc non Af Amer 58 (*) >90 mL/min   GFR calc Af Amer 68 (*) >90 mL/min   Comment: (NOTE)     The eGFR has been calculated using the CKD EPI equation.     This calculation has not been validated in all clinical situations.     eGFR's persistently <90 mL/min signify possible Chronic Kidney     Disease.   Anion gap 15  5 - 15    Imaging: Imaging results have been reviewed  Assessment/Plan:   1. Active Problems: 2.  HYPERTENSION, BENIGN 3.   CAP (community acquired pneumonia) 4.   Anemia of chronic disease 5.    Goodpasture's disease, treated with steroid, oral cytoxan and plasmapheresis   6.   Acute on chronic diastolic HF (heart failure) in combination with rapid a flutter and anemia 7.   CKD (chronic kidney disease) stage 3, GFR 30-59 ml/min 8.   Sepsis 9.   Atrial flutter with rapid ventricular response 10.   Ejection fraction 11.   Warfarin anticoagulation 12.   Time Spent Directly with Patient:  20 minutes  Length of Stay:  LOS: 5 days   Feels clinically improved after tx 1U PRBCs this am. HGB increased to 9.4. Aflutter with VR in 90s on CCB and BB. V/Q scan intermediate prob with V/Q mismatch but already anticoagulated. Exam benign. On ATBX as well as meds for Goodpastures. Nothing further to add from a cardiology perspective. Will sign off but be available if further questions arise. Please arrange F/U with Dr. Irish Lack as an OP after D/C.   Lorretta Harp 04/04/2014, 6:28 AM

## 2014-04-04 NOTE — Progress Notes (Signed)
PROGRESS NOTE  Bryan Hubbard:[id number] DOB: [birth year] DOA: 03/30/2014 PCP: [name], MD  HPI/Subjective: Denies any shortness of breath while he is in bed, has exertional dyspnea. Had his PFTs done this morning, after which she developed RVR with his a flutter, heart rate up to 130.  Interim history: On admission CT scan showed diffuse interstitial changes ?atypical infection. Pulmonology and Cardiology consulted for his dyspnea and his A flutter with RVR.  Assessment/Plan:  Acute hypoxic respiratory failure -Due to ?HCAP vs interstitial lung disease, oxygen as needed, continue supportive treatment. Pulmonology consulted, appreciate input.  -At rest patient is comfortable however relates to me that on ambulation to the bathroom and back he is pretty short of breath - this is somewhat multifactorial with lung disease possible infectious in etiology, component of diastolic dysfunction, rapid heart rate and anemia.  - currently receiving antibiotics, steroids, transfusion today and is rate controlled per cardiology.  A flutter with RVR  -Was on Metoprolol 75 BID but had side effects as an outpatient and his dose was decreased to 50 BID.  - cardiology consulted, appreciate input, per cardiology his A flutter is somewhat stable and since it has been present for few months unlikely to be a major contributor to his dyspnea and he can have the ablation in few weeks as planned.  -Rate is controlled generally, but this morning heart rate went up to 130 after his PFTs  HCAP - with fever this certainly possible, some atypical appearance on the chest CT,  - continue Levofloxacin and Acyclovir. - afebrile  CKD III - Cr at baseline, good UOP. Avoid nephrotoxins.   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 Posey Pronto 7/6, patient received Epo 7/6 while hospitalized - contributes to  some extent to his dyspnea - will transfuse 1U today and monitor respiratory status, may need a second unit. Will hold on administering Lasix for now given intermittent hypotension and need for Diltiazem and Metoprolol.   Diet: low sodium Fluids: none DVT Prophylaxis: Coumadin  Code Status: Full Family Communication: d/w wife  Disposition Plan: inpatient, transfer to telemetry later today  Consultants:  Cardiology   PCCM  Procedures:  None    Antibiotics Vancomycin 7/3 >> 7/5 Cefepime 7/3 >> 7/5 Levofloxacin 7/4 >> Acyclovir 7/6 >>     Objective: Filed Vitals:   04/03/14 2345 04/04/14 0208 04/04/14 0510 04/04/14 1028  BP: 124/75 113/76 115/79 117/75  Pulse:  88 75 133  Temp:  97.4 F (36.3 C) 98.2 F (36.8 C)   TempSrc:  Oral Oral   Resp:  20 20   Height:      Weight:      SpO2:  93% 91% 96%    Intake/Output Summary (Last 24 hours) at 04/04/14 1110 Last data filed at 04/04/14 1032  Gross per 24 hour  Intake    870 ml  Output    875 ml  Net     -5 ml   Filed Weights   04/01/14 0400 04/02/14 0400 04/03/14 0400  Weight: 80.6 kg (177 lb 11.1 oz) 63.1 kg (139 lb 1.8 oz) 65.1 kg (143 lb 8.3 oz)   Exam:  General:  NAD  Cardiovascular: irregular, tachycardic  Respiratory: faint rales, no wheezing, moves air well   Abdomen: soft, not tender to palpation, positive bowel sounds  MSK: no peripheral edema  Neuro: non focal   Data Reviewed: Basic  Metabolic Panel:  Recent Labs Lab 03/30/14 2015 03/31/14 0212 04/01/14 0612 04/01/14 0835 04/02/14 0010 04/03/14 0344 04/04/14 0425  NA 129* 132* 136*  --  133* 136* 135*  K 4.3 4.0 4.0  --  4.1 5.0 4.5  CL 93* 97 100  --  99 100 99  CO2 23 24 23   --  20 25 21   GLUCOSE 90 85 95  --  168* 97 172*  BUN 27* 28* 26*  --  27* 25* 31*  CREATININE 1.54* 1.60* 1.38*  --  1.29 1.25 1.19  CALCIUM 8.7 8.6 9.1  --  9.5 9.9 10.3  MG  --  1.6  --  1.7  --   --   --   PHOS  --  3.4  --   --   --   --   --     Liver Function Tests:  Recent Labs Lab 03/30/14 2015 03/31/14 0212 04/01/14 0612 04/03/14 0344 04/04/14 0425  AST 24 27 32 36 44*  ALT 37 35 41 59* 83*  ALKPHOS 54 52 52 57 66  BILITOT 0.9 0.8 0.5 0.3 0.3  PROT 5.5* 5.5* 5.5* 5.5* 5.8*  ALBUMIN 2.5* 2.4* 2.3* 2.2* 2.4*   CBC:  Recent Labs Lab 03/30/14 2015 03/31/14 0212 04/01/14 0612 04/02/14 0010 04/03/14 0344 04/04/14 0425  WBC 4.2 3.5* 3.6* 3.9* 3.4* 3.4*  NEUTROABS 3.4  --   --   --   --   --   HGB 8.3* 8.1* 8.0* 7.4* 7.9* 9.4*  HCT 24.3* 23.9* 23.6* 21.5* 23.5* 27.3*  MCV 96.4 97.6 97.1 96.4 97.5 92.9  PLT 236 224 194 192 204 222   Cardiac Enzymes:  Recent Labs Lab 03/30/14 2015  TROPONINI <0.30   BNP (last 3 results)  Recent Labs  12/18/13 1635 03/05/14 0938 03/30/14 2015  PROBNP 5296.0* 573.0* 7170.0*   Recent Results (from the past 240 hour(s))  CULTURE, BLOOD (ROUTINE X 2)     Status: None   Collection Time    03/30/14  8:15 PM      Result Value Ref Range Status   Specimen Description BLOOD LEFT ANTECUBITAL   Final   Special Requests BOTTLES DRAWN AEROBIC AND ANAEROBIC 3CC   Final   Culture  Setup Time     Final   Value: 03/31/2014 02:17     Performed at [facility]   Culture     Final   Value:        BLOOD CULTURE RECEIVED NO GROWTH TO DATE CULTURE WILL BE HELD FOR 5 DAYS BEFORE ISSUING A FINAL NEGATIVE REPORT     Performed at [facility]   Report Status PENDING   Incomplete  CULTURE, BLOOD (ROUTINE X 2)     Status: None   Collection Time    03/30/14  8:20 PM      Result Value Ref Range Status   Specimen Description BLOOD RIGHT FOREARM   Final   Special Requests BOTTLES DRAWN AEROBIC AND ANAEROBIC 5CC   Final   Culture  Setup Time     Final   Value: 03/31/2014 02:19     Performed at [facility]   Culture     Final   Value:        BLOOD CULTURE RECEIVED NO GROWTH TO DATE CULTURE WILL BE HELD FOR 5 DAYS BEFORE ISSUING A FINAL NEGATIVE REPORT     Performed  at [facility]   Report Status PENDING  Incomplete  URINE CULTURE     Status: None   Collection Time    03/30/14  9:00 PM      Result Value Ref Range Status   Specimen Description URINE, CLEAN CATCH   Final   Special Requests NONE   Final   Culture  Setup Time     Final   Value: 03/31/2014 02:21     Performed at [facility] Count     Final   Value: NO GROWTH     Performed at [facility]   Culture     Final   Value: NO GROWTH     Performed at [facility]   Report Status 04/01/2014 FINAL   Final  MRSA PCR SCREENING     Status: None   Collection Time    03/31/14 12:49 AM      Result Value Ref Range Status   MRSA by PCR NEGATIVE  NEGATIVE Final   Comment:            The GeneXpert MRSA Assay (FDA     approved for NASAL specimens     only), is one component of a     comprehensive MRSA colonization     surveillance program. It is not     intended to diagnose MRSA     infection nor to guide or     monitor treatment for     MRSA infections.     Studies: Nm Pulmonary Perf And Vent  04/04/2014   ADDENDUM REPORT: 04/04/2014 10:38  ADDENDUM: Comparison is made to prior exam of December 29, 2013. According to PIOPED II criteria, this defect is consistent with intermediate probability of pulmonary embolus. However, the perfusion defect in the left lung apex is new since prior exam, and therefore increases the probability of this representing acute pulmonary embolus. This result was discussed with Velna Hatchet, the patient's nurse, at 10:30 a.m. on April 04, 2014.   Electronically Signed   By: Sabino Dick M.D.   On: 04/04/2014 10:38   04/04/2014   CLINICAL DATA:  Shortness of breath.  EXAM: NUCLEAR MEDICINE VENTILATION - PERFUSION LUNG SCAN  TECHNIQUE: Ventilation images were obtained in multiple projections using inhaled aerosol technetium 99 M DTPA. Perfusion images were obtained in multiple projections after intravenous injection of Tc-60m MAA.   RADIOPHARMACEUTICALS:  46.0 mCi Tc-22m DTPA aerosol and 6.0 mCi Tc-27m MAA  COMPARISON:  Chest radiograph of March 30, 2014.  FINDINGS: Ventilation: No significant defect is noted.  Perfusion: Moderate to large defect is seen involving the apex of the left upper lobe with no corresponding ventilation defect. No corresponding radiographic abnormality is noted either.  IMPRESSION: Intermediate probability of pulmonary embolus.  Electronically Signed: By: Sabino Dick M.D. On: 04/03/2014 14:12    Scheduled Meds: . acyclovir  400 mg Oral TID  . cyclobenzaprine  5 mg Oral QHS  . cyclophosphamide  100 mg Oral Daily  . diltiazem  30 mg Oral 4 times per day  . docusate sodium  100 mg Oral BID  . dutasteride  0.5 mg Oral Daily   And  . tamsulosin  0.4 mg Oral Daily  . feeding supplement (ENSURE COMPLETE)  237 mL Oral BID BM  . latanoprost  1 drop Both Eyes Daily  . levofloxacin (LEVAQUIN) IV  750 mg Intravenous Q48H  . methylPREDNISolone (SOLU-MEDROL) injection  250 mg Intravenous 4 times per day  . metoprolol  50 mg Oral BID  . montelukast  10 mg Oral QHS  . pantoprazole  40 mg Oral Daily  . sodium chloride  3 mL Intravenous Q12H  . sodium chloride  3 mL Intravenous Q12H  . warfarin  4 mg Oral ONCE-1800  . Warfarin - Pharmacist Dosing Inpatient   Does not apply q1800   Continuous Infusions:    Active Problems:   HYPERTENSION, BENIGN   CAP (community acquired pneumonia)   Anemia of chronic disease    Goodpasture's disease, treated with steroid, oral cytoxan and plasmapheresis     Acute on chronic diastolic HF (heart failure) in combination with rapid a flutter and anemia   CKD (chronic kidney disease) stage 3, GFR 30-59 ml/min   Sepsis   Atrial flutter with rapid ventricular response   Ejection fraction   Warfarin anticoagulation  Time spent: 35  This note has been created with Surveyor, quantity. Any transcriptional errors are unintentional.     Marzetta Board, MD [facility] Pager [phone]. If 7 PM - 7 AM, please contact night-coverage at [email or web address], password [facility] 04/04/2014, 11:10 AM  LOS: 5 days

## 2014-04-04 NOTE — Progress Notes (Signed)
*  Preliminary Results* Bilateral lower extremity venous duplex completed. Bilateral lower extremities are negative for deep vein thrombosis. There is no evidence of Baker's cyst bilaterally.   Incidental finding: The left popliteal artery was visualized with an echogenic linear structure within the vessel lumen. This segment of the popliteal artery appears to be dilated when compared to surrounding segments (13.17mm vs 7.47mm). This could represent a possible dissection versus atypical plaque morphology versus unknown etiology. If it is in fact a dissection, the "true" lumen is patent with triphasic flow, and the "false" lumen does not exhibit flow, suggestive of a chronic condition.  04/04/2014  Maudry Mayhew, RVT, RDCS, RDMS

## 2014-04-04 NOTE — Progress Notes (Signed)
CARE MANAGEMENT NOTE 04/04/2014  Patient:  Bryan Hubbard, Bryan Hubbard   Account Number:  [id number]  Date Initiated:  04/03/2014  Documentation initiated by:  [facility]  Subjective/Objective Assessment:   73 year old male admitted with fever.     Action/Plan:   From home.   Anticipated DC Date:  04/06/2014   Anticipated DC Plan:  HOME/SELF CARE         Choice offered to / List presented to:             Status of service:  In process, will continue to follow Medicare Important Message given?  YES (If response is "NO", the following Medicare IM given date fields will be blank) Date Medicare IM given:  04/04/2014 Medicare IM given by:  Gabriel Earing Date Additional Medicare IM given:   Additional Medicare IM given by:    Discharge Disposition:    Per UR Regulation:  Reviewed for med. necessity/level of care/duration of stay  If discussed at [place] of Stay Meetings, dates discussed:    Comments:  04/04/14 MMCGIBBONEY, RN,BSN Pt was given an Eliquis 30-day trail card. Encouraged pt to call to activate card while here in the hospital. Also encouraged him to enroll in part D Medicare in Oct. open enrollement. Pt shows no RX coverage. Pt states that he has coverage but it would not pay for Eliquis and it was cheaper for him to stay on coumadin. Dr. Oliver Pila at bedside also enouraged pt to activate Eliquis card and to enroll in part D for Medicare.

## 2014-04-04 NOTE — Progress Notes (Signed)
Patient's HR has been sustaining in the 130s for the past 20-30 minutes, even while at rest. VS stable, asymptomatic. Dr. Hartford Poli made aware, said to give PO cardizem early.

## 2014-04-05 ENCOUNTER — Ambulatory Visit: Payer: Medicare Other | Admitting: Interventional Cardiology

## 2014-04-05 DIAGNOSIS — I10 Essential (primary) hypertension: Secondary | ICD-10-CM

## 2014-04-05 LAB — PROTIME-INR
INR: 2.74 — ABNORMAL HIGH (ref 0.00–1.49)
Prothrombin Time: 29 seconds — ABNORMAL HIGH (ref 11.6–15.2)

## 2014-04-05 LAB — BASIC METABOLIC PANEL
Anion gap: 15 (ref 5–15)
BUN: 42 mg/dL — AB (ref 6–23)
CALCIUM: 10.1 mg/dL (ref 8.4–10.5)
CO2: 22 mEq/L (ref 19–32)
Chloride: 101 mEq/L (ref 96–112)
Creatinine, Ser: 1.42 mg/dL — ABNORMAL HIGH (ref 0.50–1.35)
GFR calc Af Amer: 55 mL/min — ABNORMAL LOW (ref >90)
GFR, EST NON AFRICAN AMERICAN: 47 mL/min — AB (ref >90)
GLUCOSE: 172 mg/dL — AB (ref 70–99)
Potassium: 4 mEq/L (ref 3.7–5.3)
Sodium: 138 mEq/L (ref 137–147)

## 2014-04-05 LAB — GLUCOSE, CAPILLARY
GLUCOSE-CAPILLARY: 150 mg/dL — AB (ref 70–99)
GLUCOSE-CAPILLARY: 181 mg/dL — AB (ref 70–99)
GLUCOSE-CAPILLARY: 185 mg/dL — AB (ref 70–99)
Glucose-Capillary: 197 mg/dL — ABNORMAL HIGH (ref 70–99)

## 2014-04-05 LAB — CBC
HEMATOCRIT: 26.1 % — AB (ref 39.0–52.0)
HEMOGLOBIN: 9 g/dL — AB (ref 13.0–17.0)
MCH: 32.5 pg (ref 26.0–34.0)
MCHC: 34.5 g/dL (ref 30.0–36.0)
MCV: 94.2 fL (ref 78.0–100.0)
Platelets: 217 10*3/uL (ref 150–400)
RBC: 2.77 MIL/uL — ABNORMAL LOW (ref 4.22–5.81)
RDW: 21 % — ABNORMAL HIGH (ref 11.5–15.5)
WBC: 5.9 10*3/uL (ref 4.0–10.5)

## 2014-04-05 MED ORDER — DILTIAZEM HCL 60 MG PO TABS
60.0000 mg | ORAL_TABLET | Freq: Four times a day (QID) | ORAL | Status: DC
  Administered 2014-04-05 – 2014-04-06 (×5): 60 mg via ORAL
  Filled 2014-04-05 (×8): qty 1

## 2014-04-05 MED ORDER — DILTIAZEM HCL 30 MG PO TABS
30.0000 mg | ORAL_TABLET | Freq: Once | ORAL | Status: AC
  Administered 2014-04-05: 30 mg via ORAL
  Filled 2014-04-05: qty 1

## 2014-04-05 MED ORDER — PREDNISONE 50 MG PO TABS
60.0000 mg | ORAL_TABLET | Freq: Every day | ORAL | Status: DC
  Administered 2014-04-06: 60 mg via ORAL
  Filled 2014-04-05 (×2): qty 1

## 2014-04-05 NOTE — Progress Notes (Signed)
Patient's heart rhythm remained to be irregular, confirmed with EKG tonight, in and out of atrial fib and atrial flutter. HR-100s-130s, patient was asymptomatic and BP within normal range. I was told by the outgoing nurse that the cardiologist was made aware. We will continue to monitor patient.

## 2014-04-05 NOTE — Progress Notes (Signed)
ANTICOAGULATION CONSULT NOTE - Follow Up Consult  Pharmacy Consult for Apixaban (Eliquis) Indication: Aflutter, afib, h/o VTE  No Known Allergies  Patient Measurements: Height: 5\' 10"  (177.8 cm) Weight: 174 lb 4.8 oz (79.062 kg) IBW/kg (Calculated) : 73    Vital Signs: Temp: 97.6 F (36.4 C) (07/09 0618) Temp src: Oral (07/09 0618) BP: 125/84 mmHg (07/09 0618) Pulse Rate: 133 (07/09 0618)  Labs:  Recent Labs  04/03/14 0344 04/04/14 0425 04/05/14 0442  HGB 7.9* 9.4* 9.0*  HCT 23.5* 27.3* 26.1*  PLT 204 222 217  LABPROT 21.8* 24.0* 29.0*  INR 1.90* 2.15* 2.74*  CREATININE 1.25 1.19 1.42*    Estimated Creatinine Clearance: 47.1 ml/min (by C-G formula based on Cr of 1.42).  Assessment: 73 yr male presents 7/3 with fever.  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  Pt on warfarin PTA for history of Afib and PE.  Pharmacy was asked to continue dosing of warfarin upon admission, with heparin drip until INR therapeutic.  On 7/8, Pharmacy consulted to dose apixaban (Eliquis) for new PE (clinically suspected d/t perfusion defect on VQ) and warfarin failure.  Warfarin d/c'd - last dose 7/7.  Waiting to begin apixaban once INR < 2.    7/9: INR remains > 2, actually rising last 2 days after warfarin d/c'd Renal function: SCr bumped overnight, history of Goodpasture's syndrome; Dr. Posey Pronto, his nephrologist,  recommends Eliquis. CBC: Hgb improved and pltc stable and WNL  Goal of Therapy:  INR 2-3   Plan:   When INR < 2, begin Apixaban 10mg  PO BID for 7 days, then 5mg  PO BID  Follow up renal function and CBC.  Education with patient and family completed   Ralene Bathe, PharmD, BCPS 04/05/2014, 11:03 AM  Pager: [phone]

## 2014-04-05 NOTE — Progress Notes (Signed)
PROGRESS NOTE  Bryan Hubbard NAT:[id number] DOB: 04-17-1941 DOA: 03/30/2014 PCP: [name], MD  HPI/Subjective: Heart rate sustained between 120 and 130 since yesterday. I have increased his Cardizem to 60 mg 4 times a day and asked cardiology to reevaluate.  Interim history: On admission CT scan showed diffuse interstitial changes ?atypical infection. Pulmonology and Cardiology consulted for his dyspnea and his A flutter with RVR.  Assessment/Plan:  Acute hypoxic respiratory failure -Due to ?HCAP vs interstitial lung disease, oxygen as needed, continue supportive treatment. Pulmonology consulted, appreciate input.  -At rest patient is comfortable however relates to me that on ambulation to the bathroom and back he is pretty short of breath - this is somewhat multifactorial with lung disease possible infectious in etiology, component of diastolic dysfunction, rapid heart rate and anemia.  - currently receiving antibiotics, steroids, transfusion today and is rate controlled per cardiology.  A flutter with RVR  -Was on Metoprolol 75 BID but had side effects as an outpatient and his dose was decreased to 50 BID.  - cardiology consulted, appreciate input, per cardiology his A flutter is somewhat stable and since it has been present for few months unlikely to be a major contributor to his dyspnea and he can have the ablation in few weeks as planned.  -Rate is controlled generally, but this morning heart rate went up to 130 since yesterday morning. -Cardizem increased to 60 mg this morning, I have asked cardiology to reevaluate.  HCAP - with fever this certainly possible, some atypical appearance on the chest CT,  - continue Levofloxacin and Acyclovir. - afebrile  CKD III - Cr at baseline, good UOP. Avoid nephrotoxins.   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 Posey Pronto 7/6,  patient received Epo 7/6 while hospitalized - contributes to some extent to his dyspnea - will transfuse 1U today and monitor respiratory status, may need a second unit. Will hold on administering Lasix for now given intermittent hypotension and need for Diltiazem and Metoprolol.   Diet: low sodium Fluids: none DVT Prophylaxis: Coumadin  Code Status: Full Family Communication: d/w wife  Disposition Plan: inpatient, transfer to telemetry later today  Consultants:  Cardiology   PCCM  Procedures:  None    Antibiotics Vancomycin 7/3 >> 7/5 Cefepime 7/3 >> 7/5 Levofloxacin 7/4 >> Acyclovir 7/6 >>     Objective: Filed Vitals:   04/04/14 1903 04/04/14 2129 04/05/14 0618 04/05/14 0918  BP:  127/81 125/84   Pulse:  135 133   Temp:  97.7 F (36.5 C) 97.6 F (36.4 C)   TempSrc:  Oral Oral   Resp:  20 20   Height:      Weight:   79.062 kg (174 lb 4.8 oz)   SpO2: 92% 94% 91% 91%    Intake/Output Summary (Last 24 hours) at 04/05/14 1201 Last data filed at 04/05/14 0809  Gross per 24 hour  Intake   1080 ml  Output   1875 ml  Net   -795 ml   Filed Weights   04/02/14 0400 04/03/14 0400 04/05/14 0618  Weight: 63.1 kg (139 lb 1.8 oz) 65.1 kg (143 lb 8.3 oz) 79.062 kg (174 lb 4.8 oz)   Exam:  General:  NAD  Cardiovascular: irregular, tachycardic  Respiratory: faint rales, no wheezing, moves air well   Abdomen: soft, not tender to palpation, positive bowel sounds  MSK: no peripheral edema  Neuro:  non focal   Data Reviewed: Basic Metabolic Panel:  Recent Labs Lab 03/30/14 2015 03/31/14 7253 04/01/14 0612 04/01/14 0835 04/02/14 0010 04/03/14 0344 04/04/14 0425 04/05/14 0442  NA 129* 132* 136*  --  133* 136* 135* 138  K 4.3 4.0 4.0  --  4.1 5.0 4.5 4.0  CL 93* 97 100  --  99 100 99 101  CO2 23 24 23   --  20 25 21 22   GLUCOSE 90 85 95  --  168* 97 172* 172*  BUN 27* 28* 26*  --  27* 25* 31* 42*  CREATININE 1.54* 1.60* 1.38*  --  1.29 1.25 1.19 1.42*    CALCIUM 8.7 8.6 9.1  --  9.5 9.9 10.3 10.1  MG  --  1.6  --  1.7  --   --   --   --   PHOS  --  3.4  --   --   --   --   --   --    Liver Function Tests:  Recent Labs Lab 03/30/14 2015 03/31/14 0212 04/01/14 0612 04/03/14 0344 04/04/14 0425  AST 24 27 32 36 44*  ALT 37 35 41 59* 83*  ALKPHOS 54 52 52 57 66  BILITOT 0.9 0.8 0.5 0.3 0.3  PROT 5.5* 5.5* 5.5* 5.5* 5.8*  ALBUMIN 2.5* 2.4* 2.3* 2.2* 2.4*   CBC:  Recent Labs Lab 03/30/14 2015  04/01/14 0612 04/02/14 0010 04/03/14 0344 04/04/14 0425 04/05/14 0442  WBC 4.2  < > 3.6* 3.9* 3.4* 3.4* 5.9  NEUTROABS 3.4  --   --   --   --   --   --   HGB 8.3*  < > 8.0* 7.4* 7.9* 9.4* 9.0*  HCT 24.3*  < > 23.6* 21.5* 23.5* 27.3* 26.1*  MCV 96.4  < > 97.1 96.4 97.5 92.9 94.2  PLT 236  < > 194 192 204 222 217  < > = values in this interval not displayed. Cardiac Enzymes:  Recent Labs Lab 03/30/14 2015  TROPONINI <0.30   BNP (last 3 results)  Recent Labs  12/18/13 1635 03/05/14 0938 03/30/14 2015  PROBNP 5296.0* 573.0* 7170.0*   Recent Results (from the past 240 hour(s))  CULTURE, BLOOD (ROUTINE X 2)     Status: None   Collection Time    03/30/14  8:15 PM      Result Value Ref Range Status   Specimen Description BLOOD LEFT ANTECUBITAL   Final   Special Requests BOTTLES DRAWN AEROBIC AND ANAEROBIC 3CC   Final   Culture  Setup Time     Final   Value: 03/31/2014 02:17     Performed at [facility]   Culture     Final   Value:        BLOOD CULTURE RECEIVED NO GROWTH TO DATE CULTURE WILL BE HELD FOR 5 DAYS BEFORE ISSUING A FINAL NEGATIVE REPORT     Performed at [facility]   Report Status PENDING   Incomplete  CULTURE, BLOOD (ROUTINE X 2)     Status: None   Collection Time    03/30/14  8:20 PM      Result Value Ref Range Status   Specimen Description BLOOD RIGHT FOREARM   Final   Special Requests BOTTLES DRAWN AEROBIC AND ANAEROBIC 5CC   Final   Culture  Setup Time     Final   Value: 03/31/2014  02:19     Performed at [facility]  Culture     Final   Value:        BLOOD CULTURE RECEIVED NO GROWTH TO DATE CULTURE WILL BE HELD FOR 5 DAYS BEFORE ISSUING A FINAL NEGATIVE REPORT     Performed at [facility]   Report Status PENDING   Incomplete  URINE CULTURE     Status: None   Collection Time    03/30/14  9:00 PM      Result Value Ref Range Status   Specimen Description URINE, CLEAN CATCH   Final   Special Requests NONE   Final   Culture  Setup Time     Final   Value: 03/31/2014 02:21     Performed at [place]     Final   Value: NO GROWTH     Performed at [facility]   Culture     Final   Value: NO GROWTH     Performed at [facility]   Report Status 04/01/2014 FINAL   Final  MRSA PCR SCREENING     Status: None   Collection Time    03/31/14 12:49 AM      Result Value Ref Range Status   MRSA by PCR NEGATIVE  NEGATIVE Final   Comment:            The GeneXpert MRSA Assay (FDA     approved for NASAL specimens     only), is one component of a     comprehensive MRSA colonization     surveillance program. It is not     intended to diagnose MRSA     infection nor to guide or     monitor treatment for     MRSA infections.     Studies: Nm Pulmonary Perf And Vent  04/04/2014   ADDENDUM REPORT: 04/04/2014 10:38  ADDENDUM: Comparison is made to prior exam of December 29, 2013. According to PIOPED II criteria, this defect is consistent with intermediate probability of pulmonary embolus. However, the perfusion defect in the left lung apex is new since prior exam, and therefore increases the probability of this representing acute pulmonary embolus. This result was discussed with Velna Hatchet, the patient's nurse, at 10:30 a.m. on April 04, 2014.   Electronically Signed   By: Sabino Dick M.D.   On: 04/04/2014 10:38   04/04/2014   CLINICAL DATA:  Shortness of breath.  EXAM: NUCLEAR MEDICINE VENTILATION - PERFUSION LUNG SCAN  TECHNIQUE:  Ventilation images were obtained in multiple projections using inhaled aerosol technetium 99 M DTPA. Perfusion images were obtained in multiple projections after intravenous injection of Tc-35m MAA.  RADIOPHARMACEUTICALS:  46.0 mCi Tc-80m DTPA aerosol and 6.0 mCi Tc-19m MAA  COMPARISON:  Chest radiograph of March 30, 2014.  FINDINGS: Ventilation: No significant defect is noted.  Perfusion: Moderate to large defect is seen involving the apex of the left upper lobe with no corresponding ventilation defect. No corresponding radiographic abnormality is noted either.  IMPRESSION: Intermediate probability of pulmonary embolus.  Electronically Signed: By: Sabino Dick M.D. On: 04/03/2014 14:12    Scheduled Meds: . acyclovir  400 mg Oral TID  . cyclobenzaprine  5 mg Oral QHS  . Cyclophosphamide  100 mg Oral Daily  . diltiazem  60 mg Oral 4 times per day  . docusate sodium  100 mg Oral BID  . dutasteride  0.5 mg Oral Daily   And  . tamsulosin  0.4 mg Oral Daily  . feeding  supplement (ENSURE COMPLETE)  237 mL Oral BID BM  . insulin aspart  0-15 Units Subcutaneous TID WC  . insulin aspart  0-5 Units Subcutaneous QHS  . latanoprost  1 drop Both Eyes Daily  . [START ON 04/06/2014] levofloxacin  750 mg Oral Q48H  . methylPREDNISolone (SOLU-MEDROL) injection  250 mg Intravenous 4 times per day  . metoprolol  50 mg Oral BID  . mometasone-formoterol  2 puff Inhalation BID  . montelukast  10 mg Oral QHS  . pantoprazole  40 mg Oral Daily  . sodium chloride  3 mL Intravenous Q12H  . sodium chloride  3 mL Intravenous Q12H   Continuous Infusions:    Active Problems:   HYPERTENSION, BENIGN   CAP (community acquired pneumonia)   Anemia of chronic disease    Goodpasture's disease, treated with steroid, oral cytoxan and plasmapheresis     Acute on chronic diastolic HF (heart failure) in combination with rapid a flutter and anemia   CKD (chronic kidney disease) stage 3, GFR 30-59 ml/min   Sepsis   Atrial  flutter with rapid ventricular response   Ejection fraction   Warfarin anticoagulation  Time spent: 35  This note has been created with Surveyor, quantity. Any transcriptional errors are unintentional.   Marzetta Board, MD [facility] Pager [phone]. If 7 PM - 7 AM, please contact night-coverage at [email or web address], password [email or web address] 04/05/2014, 12:01 PM  LOS: 6 days

## 2014-04-05 NOTE — Progress Notes (Signed)
Came to visit patient to offer and explain [facility] Care Management services. Noted number of admissions appear to be HD related in chart encounters in EPIC. Nonetheless, patient states " I manage pretty well at home as I have been dealing with my issues for years". Inquired about outpatient rehab however. Made him aware that writer will relay that request to inpatient RNCM. Left [facility] Care Management brochure for patient to contact in future if he changes his mind.  Marthenia Rolling, MSN- RN, [facility] [phone]

## 2014-04-05 NOTE — Progress Notes (Signed)
SUBJECTIVE:  SOB improved  OBJECTIVE:   Vitals:   Filed Vitals:   04/04/14 2129 04/05/14 0618 04/05/14 0918 04/05/14 1357  BP: 127/81 125/84  132/73  Pulse: 135 133  91  Temp: 97.7 F (36.5 C) 97.6 F (36.4 C)  97.6 F (36.4 C)  TempSrc: Oral Oral  Oral  Resp: 20 20  20   Height:      Weight:  174 lb 4.8 oz (79.062 kg)    SpO2: 94% 91% 91% 95%   I&O's:   Intake/Output Summary (Last 24 hours) at 04/05/14 1431 Last data filed at 04/05/14 1300  Gross per 24 hour  Intake    720 ml  Output   2075 ml  Net  -1355 ml   TELEMETRY: Reviewed telemetry pt in atrial fibrillation with HR 90bpm     PHYSICAL EXAM General: Well developed, well nourished, in no acute distress Head: Eyes PERRLA, No xanthomas.   Normal cephalic and atramatic  Lungs:   Few crackles at right base that clear with cough Heart:  Irregularly irregular S1 S2 Pulses are 2+ & equal. Abdomen: Bowel sounds are positive, abdomen soft and non-tender without masses  Extremities:   No clubbing, cyanosis or edema.  DP +1 Neuro: Alert and oriented X 3. Psych:  Good affect, responds appropriately   LABS: Basic Metabolic Panel:  Recent Labs  04/04/14 0425 04/05/14 0442  NA 135* 138  K 4.5 4.0  CL 99 101  CO2 21 22  GLUCOSE 172* 172*  BUN 31* 42*  CREATININE 1.19 1.42*  CALCIUM 10.3 10.1   Liver Function Tests:  Recent Labs  04/03/14 0344 04/04/14 0425  AST 36 44*  ALT 59* 83*  ALKPHOS 57 66  BILITOT 0.3 0.3  PROT 5.5* 5.8*  ALBUMIN 2.2* 2.4*   No results found for this basename: LIPASE, AMYLASE,  in the last 72 hours CBC:  Recent Labs  04/04/14 0425 04/05/14 0442  WBC 3.4* 5.9  HGB 9.4* 9.0*  HCT 27.3* 26.1*  MCV 92.9 94.2  PLT 222 217   Cardiac Enzymes: No results found for this basename: CKTOTAL, CKMB, CKMBINDEX, TROPONINI,  in the last 72 hours BNP: No components found with this basename: POCBNP,  D-Dimer: No results found for this basename: DDIMER,  in the last 72  hours Hemoglobin A1C: No results found for this basename: HGBA1C,  in the last 72 hours Fasting Lipid Panel: No results found for this basename: CHOL, HDL, LDLCALC, TRIG, CHOLHDL, LDLDIRECT,  in the last 72 hours Thyroid Function Tests: No results found for this basename: TSH, T4TOTAL, FREET3, T3FREE, THYROIDAB,  in the last 72 hours Anemia Panel: No results found for this basename: VITAMINB12, FOLATE, FERRITIN, TIBC, IRON, RETICCTPCT,  in the last 72 hours Coag Panel:   Lab Results  Component Value Date   INR 2.74* 04/05/2014   INR 2.15* 04/04/2014   INR 1.90* 04/03/2014    RADIOLOGY: Dg Chest 2 View  03/30/2014   CLINICAL DATA:  Several month history of shortness of breath with history of CHF.  EXAM: CHEST  2 VIEW  COMPARISON:  Portable chest x-ray of March 16, 2014  FINDINGS: The lungs are well-expanded. The interstitial markings are mildly prominent. The cardiac silhouette is top-normal in size. The pulmonary vascularity is not clearly engorged. There is no pleural effusion. The bony thorax is unremarkable.  IMPRESSION: Mildly increased interstitial markings may reflect interstitial pneumonia but mild CHF may produce similar findings. There is no classic alveolar pneumonia.  Electronically Signed   By: David  Martinique   On: 03/30/2014 18:17   Ct Chest Wo Contrast  03/30/2014   CLINICAL DATA:  Shortness of breath  EXAM: CT CHEST WITHOUT CONTRAST  TECHNIQUE: Multidetector CT imaging of the chest was performed following the standard protocol without IV contrast.  COMPARISON:  Recent chest radiography, including 03/16/2014  FINDINGS: THORACIC INLET/BODY WALL:  No acute abnormality.  MEDIASTINUM:  No cardiomegaly. Multi focal coronary artery atherosclerosis. Small volume of fat along the right side of the interventricular septum which could be from remote infarct. No pericardial effusion. Prominent mediastinal lymph nodes, some containing coarse calcification. Right hilar nodes are also calcified. The  largest node is in the sub- carina region measuring 16 mm short axis. No acute vascular findings. The main pulmonary artery appears normal, but the right pulmonary artery appears mildly dilated, equivocal for pulmonary hypertension.  LUNG WINDOWS:  There is diffuse but mildly basilar predominant subpleural reticulation. Lung volumes remain normal. No consolidation or suspicious nodularity. Fissures are mildly distorted, suggesting fibrotic changes, but no honeycombing noted. No pleural effusion.  UPPER ABDOMEN:  No acute findings.  OSSEOUS:  No acute fracture.  No suspicious lytic or blastic lesions.  IMPRESSION: Diffuse interstitial lung abnormality. While an acute atypical infection is a diagnostic possibility, there has been persistent interstitial abnormality over multiple months by chest radiography. 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 The current abnormality is not typical of alveolar hemorrhage, but recommend correlation with chest CT imaging from time of diagnosis.   Electronically Signed   By: Jorje Guild M.D.   On: 03/30/2014 23:33   Nm Pulmonary Perf And Vent  04/04/2014   ADDENDUM REPORT: 04/04/2014 10:38  ADDENDUM: Comparison is made to prior exam of December 29, 2013. According to PIOPED II criteria, this defect is consistent with intermediate probability of pulmonary embolus. However, the perfusion defect in the left lung apex is new since prior exam, and therefore increases the probability of this representing acute pulmonary embolus. This result was discussed with Velna Hatchet, the patient's nurse, at 10:30 a.m. on April 04, 2014.   Electronically Signed   By: Sabino Dick M.D.   On: 04/04/2014 10:38   04/04/2014   CLINICAL DATA:  Shortness of breath.  EXAM: NUCLEAR MEDICINE VENTILATION - PERFUSION LUNG SCAN  TECHNIQUE: Ventilation images were obtained in multiple projections using inhaled aerosol technetium 99  M DTPA. Perfusion images were obtained in multiple projections after intravenous injection of Tc-30m MAA.  RADIOPHARMACEUTICALS:  46.0 mCi Tc-84m DTPA aerosol and 6.0 mCi Tc-18m MAA  COMPARISON:  Chest radiograph of March 30, 2014.  FINDINGS: Ventilation: No significant defect is noted.  Perfusion: Moderate to large defect is seen involving the apex of the left upper lobe with no corresponding ventilation defect. No corresponding radiographic abnormality is noted either.  IMPRESSION: Intermediate probability of pulmonary embolus.  Electronically Signed: By: Sabino Dick M.D. On: 04/03/2014 14:12   Dg Chest Port 1 View  03/16/2014   CLINICAL DATA:  Atrial fibrillation with rapid ventricular response, began vomiting last night, shortness of breath, worse with exertion, intermittent dizziness, past history hyperlipidemia, hypertension, asthma, Goodpasture syndrome, chronic diastolic CHF  EXAM: PORTABLE CHEST - 1 VIEW  COMPARISON:  Portable exam 1808 hr compared to 12/29/2013  FINDINGS: Enlargement of cardiac silhouette with pulmonary vascular congestion.  Mediastinal contours normal.  Chronic accentuation of interstitial markings in the mid to lower lungs bilaterally, similar to previous exam.  No segmental consolidation, pleural effusion or pneumothorax.  Bones diffusely demineralized.  Iodide positioning with slight rotation.  IMPRESSION: Enlargement of cardiac silhouette with pulmonary vascular congestion.  Persistent interstitial prominence in the mid to lower lungs could represent minimal recurrent edema or developing chronic interstitial lung disease/fibrosis.   Electronically Signed   By: Lavonia Dana M.D.   On: 03/16/2014 18:27    Assessment/Plan:  Active Problems: HYPERTENSION, BENIGN CAP (community acquired pneumonia) Anemia of chronic disease Goodpasture's disease, treated with steroid, oral cytoxan and plasmapheresis  Acute on chronic diastolic HF (heart failure) in combination with rapid a flutter  and anemia CKD (chronic kidney disease) stage 3, GFR 30-59 ml/min Sepsis Atrial flutter with rapid ventricular response - HR had been controlled but spiked to 120-130's since yesterday.  Cardizem increased to 60mg  QID and now HR in the 90's.  Will continue to watch HR and adjust dose as needed Ejection fraction 50-55% Warfarin anticoagulation   Sueanne Margarita, MD  04/05/2014  2:31 PM

## 2014-04-05 NOTE — Progress Notes (Signed)
PULMONARY / CRITICAL CARE MEDICINE  Name: Bryan Hubbard MRN: [id number] DOB: Feb 24, 1941    ADMISSION DATE:  03/30/2014 CONSULTATION DATE:  03/31/2014  REFERRING MD :  EDP PRIMARY SERVICE:  PCCM  CHIEF COMPLAINT:  Hypoxemic respiratory failure  BRIEF PATIENT DESCRIPTION: 73 y/o with Goodpasture's syndrome presenting on 7/3 with hypoxemic respiratory failure and interstitial process on chest CT.  SIGNIFICANT EVENTS: 7/3  Admit with hypoxemic respiratory failure, GGO on CT 7/7  Pulse steroids started  STUDIES:  7/3  Chest CT >>> bilateral interstitial lung abnormality without nodules or consolidation, no effusion 7/7  VQ scan >>> intermediate probability of PE, new acute perfusion defect LUL since prior VQ in 12/2013 7/7  PFT >>> restrictive pattern, severe diffusion impairment, significant bronchodilator responce 7/8  Venous duplex >>> no clot 7/8  Converted form Coumadin to Eliquis  LINES / TUBES:  CULTURES:  ANTIBIOTICS: Cefepime 7/3 >>> 7/5 Levaquin 7/3 >>> Vancomycin 7/3 >>> 7/5 Acyclovir 7/6 >>>  INTERVAL HISTORY:    VITAL SIGNS: Temp:  [97.6 F (36.4 C)-97.7 F (36.5 C)] 97.6 F (36.4 C) (07/09 0618) Pulse Rate:  [131-135] 133 (07/09 0618) Resp:  [20] 20 (07/09 0618) BP: (118-130)/(73-88) 125/84 mmHg (07/09 0618) SpO2:  [91 %-94 %] 91 % (07/09 0918) Weight:  [79.062 kg (174 lb 4.8 oz)] 79.062 kg (174 lb 4.8 oz) (07/09 0618)  INTAKE / OUTPUT: Intake/Output     07/08 0701 - 07/09 0700 07/09 0701 - 07/10 0700   P.O. 1200 240   I.V. (mL/kg) 3 (0)    Blood     Total Intake(mL/kg) 1203 (15.2) 240 (3)   Urine (mL/kg/hr) 1875 (1)    Total Output 1875     Net -672 +240         PHYSICAL EXAMINATION: General:  Comfortable breathing ambient air Neuro:  Awake, alert, interactive HEENT:  PERRL Cardiovascular:  Regular Lungs: Bilateral air entry, few rales Abdomen:  Soft, nontender, bowel sounds diminished Musculoskeletal:  No pedal edema Skin:   Intact  LABS: CBC  Recent Labs Lab 04/03/14 0344 04/04/14 0425 04/05/14 0442  WBC 3.4* 3.4* 5.9  HGB 7.9* 9.4* 9.0*  HCT 23.5* 27.3* 26.1*  PLT 204 222 217   Coag's  Recent Labs Lab 04/03/14 0344 04/04/14 0425 04/05/14 0442  INR 1.90* 2.15* 2.74*   BMET  Recent Labs Lab 04/03/14 0344 04/04/14 0425 04/05/14 0442  NA 136* 135* 138  K 5.0 4.5 4.0  CL 100 99 101  CO2 25 21 22   BUN 25* 31* 42*  CREATININE 1.25 1.19 1.42*  GLUCOSE 97 172* 172*   Electrolytes  Recent Labs Lab 03/31/14 0212  04/01/14 0835  04/03/14 0344 04/04/14 0425 04/05/14 0442  CALCIUM 8.6  < >  --   < > 9.9 10.3 10.1  MG 1.6  --  1.7  --   --   --   --   PHOS 3.4  --   --   --   --   --   --   < > = values in this interval not displayed. Sepsis Markers  Recent Labs Lab 03/30/14 2043 03/31/14 0047 04/02/14 0010 04/04/14 0425  LATICACIDVEN 0.84  --   --   --   PROCALCITON  --  0.18 0.14 <0.10   ABG No results found for this basename: PHART, PCO2ART, PO2ART,  in the last 168 hours  Liver Enzymes  Recent Labs Lab 04/01/14 0612 04/03/14 0344 04/04/14 0425  AST 32 36 44*  ALT 41 59* 83*  ALKPHOS 52 57 66  BILITOT 0.5 0.3 0.3  ALBUMIN 2.3* 2.2* 2.4*   Cardiac Enzymes  Recent Labs Lab 03/30/14 2015  TROPONINI <0.30  PROBNP 7170.0*   Glucose  Recent Labs Lab 04/04/14 1654 04/04/14 2124 04/05/14 0724 04/05/14 1150  GLUCAP 165* 241* 150* 181*    IMAGING: Nm Pulmonary Perf And Vent  04/04/2014   ADDENDUM REPORT: 04/04/2014 10:38  ADDENDUM: Comparison is made to prior exam of December 29, 2013. According to PIOPED II criteria, this defect is consistent with intermediate probability of pulmonary embolus. However, the perfusion defect in the left lung apex is new since prior exam, and therefore increases the probability of this representing acute pulmonary embolus. This result was discussed with Velna Hatchet, the patient's nurse, at 10:30 a.m. on April 04, 2014.   Electronically  Signed   By: Sabino Dick M.D.   On: 04/04/2014 10:38   04/04/2014   CLINICAL DATA:  Shortness of breath.  EXAM: NUCLEAR MEDICINE VENTILATION - PERFUSION LUNG SCAN  TECHNIQUE: Ventilation images were obtained in multiple projections using inhaled aerosol technetium 99 M DTPA. Perfusion images were obtained in multiple projections after intravenous injection of Tc-77m MAA.  RADIOPHARMACEUTICALS:  46.0 mCi Tc-43m DTPA aerosol and 6.0 mCi Tc-88m MAA  COMPARISON:  Chest radiograph of March 30, 2014.  FINDINGS: Ventilation: No significant defect is noted.  Perfusion: Moderate to large defect is seen involving the apex of the left upper lobe with no corresponding ventilation defect. No corresponding radiographic abnormality is noted either.  IMPRESSION: Intermediate probability of pulmonary embolus.  Electronically Signed: By: Sabino Dick M.D. On: 04/03/2014 14:12    ASSESSMENT / PLAN:  Hypoxemic respiratory failure, multifactorial. Severe diffusion impairment on PFT. Significant dyspnea ( one flight of stairs ) has been present since Dx of Goodpasture's syndrome in March.  There were no significant respiratory symptoms prior to March, including back in 2011 when he was told he has congestive heart failure and atrial fibrillation. There was no significant change form baseline dyspnea on this admission; the symptoms were mainly fatigue and fever on admission.   Needs ambulating SpO2 before d/c  Suspect needs oxygen with exertion and PRN   Reactive airway disease.  PFT is not formally consistent with COPD exacerbation but FEV1/FVC ration can be falsely elevated due to coexisting restrictive / ILD disease.  There was a significant bronchodilator response. Also significant history of smoking.   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  PFT is consistent ( increased FEV1/FVC and severely impaired diffusion ). Goodpasture's syndrome, diagnosis of which corresponds to onset of dyspnea. Unlikely diffuse  alveolar hemorrhage.   Pulse steroids started 7/7, Solu-Medrol 250 mg IV q6h x 3 days  Prednisone 60 mg daily starting 7/10  Possible pneumonia - initially suspected based on fever, abnormal chest imaging and dyspnea.  Of note, dyspnea and parenchymal changes were present before this admission, essentially since March. Viral / PCP pneumonia less likely.   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  Sub therapeutic INR on admission.  According to patient history of wide fluctuations in INR.  New perfusion defect on VQ, clinically PE is suspected as the degree of parenchymal involvement does not match the degree of dyspnea /  hypoxemia.  Coumadin failure.   Eliquis    Pulmonary hypertension; PAP by TTE 3/27 45 torr.  Chronic diastolic heart failure -  There is no evidence of acute component.  Of note, there were no significant respiratory symptoms when he was initially diagnosed in 2011.   No indications for diuresis at this time  Atrial fibrillation / flutter.     Cardiology following  Metoprolol, Cardizem  Ablation eventually, but it seems there is no reason for urgent cardioversion  Anemia.   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  Follow up appointment with Dr. Lake Bells July 29 th, 3:45 PM PCCM will sign off.  Please reconsult if necessary.  I have personally obtained history, examined patient, evaluated and interpreted laboratory and imaging results, reviewed medical records, formulated assessment / plan and placed orders.  Doree Fudge, MD [facility] [place] Pager: [phone]  04/05/2014, 12:21 PM

## 2014-04-06 DIAGNOSIS — I776 Arteritis, unspecified: Secondary | ICD-10-CM

## 2014-04-06 LAB — CULTURE, BLOOD (ROUTINE X 2)
CULTURE: NO GROWTH
Culture: NO GROWTH

## 2014-04-06 LAB — PROTIME-INR
INR: 2.55 — ABNORMAL HIGH (ref 0.00–1.49)
Prothrombin Time: 27.4 seconds — ABNORMAL HIGH (ref 11.6–15.2)

## 2014-04-06 LAB — GLUCOSE, CAPILLARY
GLUCOSE-CAPILLARY: 214 mg/dL — AB (ref 70–99)
Glucose-Capillary: 131 mg/dL — ABNORMAL HIGH (ref 70–99)

## 2014-04-06 LAB — CBC
HCT: 26.7 % — ABNORMAL LOW (ref 39.0–52.0)
Hemoglobin: 8.9 g/dL — ABNORMAL LOW (ref 13.0–17.0)
MCH: 31.7 pg (ref 26.0–34.0)
MCHC: 33.3 g/dL (ref 30.0–36.0)
MCV: 95 fL (ref 78.0–100.0)
Platelets: 223 10*3/uL (ref 150–400)
RBC: 2.81 MIL/uL — AB (ref 4.22–5.81)
RDW: 21 % — AB (ref 11.5–15.5)
WBC: 6.4 10*3/uL (ref 4.0–10.5)

## 2014-04-06 MED ORDER — DILTIAZEM HCL ER COATED BEADS 240 MG PO CP24: 240.0000 mg | ORAL_CAPSULE | Freq: Every day | ORAL | Status: AC

## 2014-04-06 MED ORDER — PREDNISONE 20 MG PO TABS: 60.0000 mg | ORAL_TABLET | Freq: Every day | ORAL | Status: AC

## 2014-04-06 MED ORDER — APIXABAN 5 MG PO TABS: 5.0000 mg | ORAL_TABLET | Freq: Two times a day (BID) | ORAL | Status: AC

## 2014-04-06 MED ORDER — LEVOFLOXACIN 750 MG PO TABS: 750.0000 mg | ORAL_TABLET | ORAL | Status: DC

## 2014-04-06 MED ORDER — ACYCLOVIR 400 MG PO TABS: 400.0000 mg | ORAL_TABLET | Freq: Three times a day (TID) | ORAL | Status: DC

## 2014-04-06 NOTE — Discharge Summary (Addendum)
Physician Discharge Summary  CAVAN BEARDEN PZW:[id number] DOB: 1940/11/12 DOA: 03/30/2014  PCP: [name], MD  Admit date: 03/30/2014 Discharge date: 04/06/2014  Time spent: 40 minutes  Recommendations for Outpatient Follow-up:  1. Followup with pulmonology and primary care physician.  Discharge Diagnoses:  Principal Problem:   CAP (community acquired pneumonia) Active Problems:   HYPERTENSION, BENIGN   Anemia of chronic disease    Goodpasture's disease, treated with steroid, oral cytoxan and plasmapheresis     Acute on chronic diastolic HF (heart failure) in combination with rapid a flutter and anemia   CKD (chronic kidney disease) stage 3, GFR 30-59 ml/min   Sepsis   Atrial flutter with rapid ventricular response   Ejection fraction   Warfarin anticoagulation   Discharge Condition: Stable  Diet recommendation: Heart healthy  Filed Weights   04/03/14 0400 04/05/14 0618 04/06/14 0628  Weight: 65.1 kg (143 lb 8.3 oz) 79.062 kg (174 lb 4.8 oz) 79.742 kg (175 lb 12.8 oz)    History of present illness:  Patient have had fever, chills, and increased short of breath. Patient slept all day and felt very weak and had chills.  NO cough, reports some nausea but no vomiting. No abdominal pain no diarrhea.  Patietn has hx of Goodpasture disease currently on prednisone and cytoxan sp a course of plasmaphoresis he is followed for this by Dr. Posey Pronto. Patient never had hemoptysis. He has known history of PE status post IVC filter as well as history of A. fib/A. flutter for by cardiology currently on Coumadin for prophylaxis of CVA. Patient reports that in the past his metoprolol dose to be adjusted down secondary to hypotension. His blood pressure usually runs in the low 100-110. He have not had any recent syncopal events. 19 chest pains. For the past few days his shortness of breath has been worse denies any cough.  Hospitalist was called for admission for CAP w A.fib w RVR  Hospital  Course:   Acute hypoxic respiratory failure  -Due to ?HCAP vs interstitial lung disease, oxygen as needed, continue supportive treatment. Pulmonology consulted, appreciate input.  -At rest patient is comfortable however relates to me that on ambulation to the bathroom and back he is pretty short of breath  - this is somewhat multifactorial with lung disease possible infectious in etiology, component of diastolic dysfunction, rapid heart rate and anemia.  -Patient needed 2 L of oxygen with ambulation, his oxygen saturation dropped to 83% with ambulation. -Discharge on prednisone 60 mg total he sees his pulmonologist as outpatient on 7/29.  A flutter with RVR  -Was on Metoprolol 75 BID but had side effects as an outpatient and his dose was decreased to 50 BID.  - cardiology consulted, appreciate input, per cardiology his A flutter is somewhat stable and since it has been present for few months unlikely to be a major contributor to his dyspnea and he can have the ablation in few weeks as planned.  -Rate is controlled generally, but this morning heart rate went up to 130 since yesterday morning.  -Discharge on metoprolol 50 mg twice a day and Cardizem CD 240 mg.  HCAP -With fever this certainly possible, some atypical appearance on the chest CT,  -Pulmonology recommended to continue Levofloxacin and Acyclovir for 7 days.   CKD III - Cr at baseline, good UOP. Avoid nephrotoxins. Lasix restarted the day of discharge.  Goodpasture's - on Cytoxan and Prednisone, continue Prednisone  - restarted Cytoxan per Pulm on 7/6   Anemia -  of chronic disease, discussed with Dr. Posey Pronto 7/6, patient received Epo 7/6 while hospitalized  - contributes to some extent to his dyspnea  - will transfuse 1U today and monitor respiratory status, may need a second unit. Will hold on administering Lasix for now given intermittent hypotension and need for Diltiazem and Metoprolol.  ? PE -Patient has hypoxia which can be  explained by the Goodpasture syndrome and interstitial process with diffusion defect. -VQ scan done and showed intermediate probability, this is new since prior V/Q in April of 2015. -Pulmonology recommended to switch anticoagulation to Eliquis as patient has difficulties keeping therapeutic INR. -At the time of discharge INR is 2.5, I advised the patient to skip today and tomorrow and start Eliquis on Sunday morning.   Procedures:  None  Consultations:  None  Discharge Exam: Filed Vitals:   04/06/14 0927  BP: 112/71  Pulse: 98  Temp:   Resp:    General: Alert and awake, oriented x3, not in any acute distress. HEENT: anicteric sclera, pupils reactive to light and accommodation, EOMI CVS: S1-S2 clear, no murmur rubs or gallops Chest: clear to auscultation bilaterally, no wheezing, rales or rhonchi Abdomen: soft nontender, nondistended, normal bowel sounds, no organomegaly Extremities: no cyanosis, clubbing or edema noted bilaterally Neuro: Cranial nerves II-XII intact, no focal neurological deficits  Discharge Instructions You were cared for by a hospitalist during your hospital stay. If you have any questions about your discharge medications or the care you received while you were in the hospital after you are discharged, you can call the unit and asked to speak with the hospitalist on call if the hospitalist that took care of you is not available. Once you are discharged, your primary care physician will handle any further medical issues. Please note that NO REFILLS for any discharge medications will be authorized once you are discharged, as it is imperative that you return to your primary care physician (or establish a relationship with a primary care physician if you do not have one) for your aftercare needs so that they can reassess your need for medications and monitor your lab values.  Discharge Instructions   Diet - low sodium heart healthy    Complete by:  As directed       Increase activity slowly    Complete by:  As directed             Medication List    STOP taking these medications       warfarin 1 MG tablet  Commonly known as:  COUMADIN     warfarin 4 MG tablet  Commonly known as:  COUMADIN      TAKE these medications       acyclovir 400 MG tablet  Commonly known as:  ZOVIRAX  Take 1 tablet (400 mg total) by mouth 3 (three) times daily.     apixaban 5 MG Tabs tablet  Commonly known as:  ELIQUIS  Take 1 tablet (5 mg total) by mouth 2 (two) times daily.     bimatoprost 0.01 % Soln  Commonly known as:  LUMIGAN  Place 1 drop into both eyes every morning.     cyanocobalamin 1000 MCG/ML injection  Commonly known as:  (VITAMIN B-12)  Inject 1,000 mcg into the muscle every 30 (thirty) days.     cyclobenzaprine 5 MG tablet  Commonly known as:  FLEXERIL  Take 5 mg by mouth at bedtime.     cyclophosphamide 50 MG tablet  Commonly known as:  CYTOXAN  Take 100 mg by mouth daily. Give on an empty stomach 1 hour before or 2 hours after meals.     diltiazem 240 MG 24 hr capsule  Commonly known as:  CARDIZEM CD  Take 1 capsule (240 mg total) by mouth daily.     Dutasteride-Tamsulosin HCl 0.5-0.4 MG Caps  Take 1 capsule by mouth daily.     furosemide 20 MG tablet  Commonly known as:  LASIX  Take 1 tablet (20 mg total) by mouth daily.     levofloxacin 750 MG tablet  Commonly known as:  LEVAQUIN  Take 1 tablet (750 mg total) by mouth every other day.     metoprolol 50 MG tablet  Commonly known as:  LOPRESSOR  Take 50 mg by mouth 2 (two) times daily.     montelukast 10 MG tablet  Commonly known as:  SINGULAIR  Take 10 mg by mouth at bedtime.     omeprazole 20 MG capsule  Commonly known as:  PRILOSEC  Take 20 mg by mouth daily.     predniSONE 20 MG tablet  Commonly known as:  DELTASONE  Take 3 tablets (60 mg total) by mouth daily with breakfast.     traMADol 50 MG tablet  Commonly known as:  ULTRAM  Take 1 tablet (50 mg total)  by mouth every 12 (twelve) hours as needed.     Vitamin D (Cholecalciferol) 1000 UNITS Tabs  Take 2,000 Units by mouth daily.       No Known Allergies     Follow-up Information   Follow up with Simonne Maffucci, MD On 04/25/2014. (App at 3:45 PM)    Specialty:  Pulmonary Disease   Contact information:   [address] [place] [address] [phone]       Follow up with [name], MD In 1 week.   Specialty:  Family Medicine   Contact information:   [address] [place] [address] [phone]       Follow up with Jettie Booze., MD In 1 week.   Specialty:  Interventional Cardiology   Contact information:   [address] [place] [address] [phone]        The results of significant diagnostics from this hospitalization (including imaging, microbiology, ancillary and laboratory) are listed below for reference.    Significant Diagnostic Studies: Dg Chest 2 View  03/30/2014   CLINICAL DATA:  Several month history of shortness of breath with history of CHF.  EXAM: CHEST  2 VIEW  COMPARISON:  Portable chest x-ray of March 16, 2014  FINDINGS: The lungs are well-expanded. The interstitial markings are mildly prominent. The cardiac silhouette is top-normal in size. The pulmonary vascularity is not clearly engorged. There is no pleural effusion. The bony thorax is unremarkable.  IMPRESSION: Mildly increased interstitial markings may reflect interstitial pneumonia but mild CHF may produce similar findings. There is no classic alveolar pneumonia.   Electronically Signed   By: David  Martinique   On: 03/30/2014 18:17   Ct Chest Wo Contrast  03/30/2014   CLINICAL DATA:  Shortness of breath  EXAM: CT CHEST WITHOUT CONTRAST  TECHNIQUE: Multidetector CT imaging of the chest was performed following the standard protocol without IV contrast.  COMPARISON:  Recent chest radiography, including 03/16/2014  FINDINGS: THORACIC INLET/BODY WALL:  No  acute abnormality.  MEDIASTINUM:  No cardiomegaly. Multi focal coronary artery atherosclerosis. Small volume of fat along the right side of the interventricular septum which  could be from remote infarct. No pericardial effusion. Prominent mediastinal lymph nodes, some containing coarse calcification. Right hilar nodes are also calcified. The largest node is in the sub- carina region measuring 16 mm short axis. No acute vascular findings. The main pulmonary artery appears normal, but the right pulmonary artery appears mildly dilated, equivocal for pulmonary hypertension.  LUNG WINDOWS:  There is diffuse but mildly basilar predominant subpleural reticulation. Lung volumes remain normal. No consolidation or suspicious nodularity. Fissures are mildly distorted, suggesting fibrotic changes, but no honeycombing noted. No pleural effusion.  UPPER ABDOMEN:  No acute findings.  OSSEOUS:  No acute fracture.  No suspicious lytic or blastic lesions.  IMPRESSION: Diffuse interstitial lung abnormality. While an acute atypical infection is a diagnostic possibility, there has been persistent interstitial abnormality over multiple months by chest radiography. 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 The current abnormality is not typical of alveolar hemorrhage, but recommend correlation with chest CT imaging from time of diagnosis.   Electronically Signed   By: Jorje Guild M.D.   On: 03/30/2014 23:33   Nm Pulmonary Perf And Vent  04/04/2014   ADDENDUM REPORT: 04/04/2014 10:38  ADDENDUM: Comparison is made to prior exam of December 29, 2013. According to PIOPED II criteria, this defect is consistent with intermediate probability of pulmonary embolus. However, the perfusion defect in the left lung apex is new since prior exam, and therefore increases the probability of this representing acute pulmonary embolus. This result was discussed with  Velna Hatchet, the patient's nurse, at 10:30 a.m. on April 04, 2014.   Electronically Signed   By: Sabino Dick M.D.   On: 04/04/2014 10:38   04/04/2014   CLINICAL DATA:  Shortness of breath.  EXAM: NUCLEAR MEDICINE VENTILATION - PERFUSION LUNG SCAN  TECHNIQUE: Ventilation images were obtained in multiple projections using inhaled aerosol technetium 99 M DTPA. Perfusion images were obtained in multiple projections after intravenous injection of Tc-71m MAA.  RADIOPHARMACEUTICALS:  46.0 mCi Tc-49m DTPA aerosol and 6.0 mCi Tc-96m MAA  COMPARISON:  Chest radiograph of March 30, 2014.  FINDINGS: Ventilation: No significant defect is noted.  Perfusion: Moderate to large defect is seen involving the apex of the left upper lobe with no corresponding ventilation defect. No corresponding radiographic abnormality is noted either.  IMPRESSION: Intermediate probability of pulmonary embolus.  Electronically Signed: By: Sabino Dick M.D. On: 04/03/2014 14:12   Dg Chest Port 1 View  03/16/2014   CLINICAL DATA:  Atrial fibrillation with rapid ventricular response, began vomiting last night, shortness of breath, worse with exertion, intermittent dizziness, past history hyperlipidemia, hypertension, asthma, Goodpasture syndrome, chronic diastolic CHF  EXAM: PORTABLE CHEST - 1 VIEW  COMPARISON:  Portable exam 1808 hr compared to 12/29/2013  FINDINGS: Enlargement of cardiac silhouette with pulmonary vascular congestion.  Mediastinal contours normal.  Chronic accentuation of interstitial markings in the mid to lower lungs bilaterally, similar to previous exam.  No segmental consolidation, pleural effusion or pneumothorax.  Bones diffusely demineralized.  Iodide positioning with slight rotation.  IMPRESSION: Enlargement of cardiac silhouette with pulmonary vascular congestion.  Persistent interstitial prominence in the mid to lower lungs could represent minimal recurrent edema or developing chronic interstitial lung disease/fibrosis.    Electronically Signed   By: Lavonia Dana M.D.   On: 03/16/2014 18:27    Microbiology: Recent Results (from the past 240 hour(s))  CULTURE, BLOOD (ROUTINE X 2)     Status: None   Collection Time  03/30/14  8:15 PM      Result Value Ref Range Status   Specimen Description BLOOD LEFT ANTECUBITAL   Final   Special Requests BOTTLES DRAWN AEROBIC AND ANAEROBIC 3CC   Final   Culture  Setup Time     Final   Value: 03/31/2014 02:17     Performed at [facility]   Culture     Final   Value: NO GROWTH 5 DAYS     Performed at [facility]   Report Status 04/06/2014 FINAL   Final  CULTURE, BLOOD (ROUTINE X 2)     Status: None   Collection Time    03/30/14  8:20 PM      Result Value Ref Range Status   Specimen Description BLOOD RIGHT FOREARM   Final   Special Requests BOTTLES DRAWN AEROBIC AND ANAEROBIC 5CC   Final   Culture  Setup Time     Final   Value: 03/31/2014 02:19     Performed at [facility]   Culture     Final   Value: NO GROWTH 5 DAYS     Performed at [facility]   Report Status 04/06/2014 FINAL   Final  URINE CULTURE     Status: None   Collection Time    03/30/14  9:00 PM      Result Value Ref Range Status   Specimen Description URINE, CLEAN CATCH   Final   Special Requests NONE   Final   Culture  Setup Time     Final   Value: 03/31/2014 02:21     Performed at [facility] Count     Final   Value: NO GROWTH     Performed at [facility]   Culture     Final   Value: NO GROWTH     Performed at [facility]   Report Status 04/01/2014 FINAL   Final  MRSA PCR SCREENING     Status: None   Collection Time    03/31/14 12:49 AM      Result Value Ref Range Status   MRSA by PCR NEGATIVE  NEGATIVE Final   Comment:            The GeneXpert MRSA Assay (FDA     approved for NASAL specimens     only), is one component of a     comprehensive MRSA colonization     surveillance program. It is not     intended to  diagnose MRSA     infection nor to guide or     monitor treatment for     MRSA infections.     Labs: Basic Metabolic Panel:  Recent Labs Lab 03/30/14 2015 03/31/14 7416 04/01/14 0612 04/01/14 3845 04/02/14 0010 04/03/14 0344 04/04/14 0425 04/05/14 0442  NA 129* 132* 136*  --  133* 136* 135* 138  K 4.3 4.0 4.0  --  4.1 5.0 4.5 4.0  CL 93* 97 100  --  99 100 99 101  CO2 23 24 23   --  20 25 21 22   GLUCOSE 90 85 95  --  168* 97 172* 172*  BUN 27* 28* 26*  --  27* 25* 31* 42*  CREATININE 1.54* 1.60* 1.38*  --  1.29 1.25 1.19 1.42*  CALCIUM 8.7 8.6 9.1  --  9.5 9.9 10.3 10.1  MG  --  1.6  --  1.7  --   --   --   --  PHOS  --  3.4  --   --   --   --   --   --    Liver Function Tests:  Recent Labs Lab 03/30/14 2015 03/31/14 0212 04/01/14 0612 04/03/14 0344 04/04/14 0425  AST 24 27 32 36 44*  ALT 37 35 41 59* 83*  ALKPHOS 54 52 52 57 66  BILITOT 0.9 0.8 0.5 0.3 0.3  PROT 5.5* 5.5* 5.5* 5.5* 5.8*  ALBUMIN 2.5* 2.4* 2.3* 2.2* 2.4*   No results found for this basename: LIPASE, AMYLASE,  in the last 168 hours No results found for this basename: AMMONIA,  in the last 168 hours CBC:  Recent Labs Lab 03/30/14 2015  04/02/14 0010 04/03/14 0344 04/04/14 0425 04/05/14 0442 04/06/14 0433  WBC 4.2  < > 3.9* 3.4* 3.4* 5.9 6.4  NEUTROABS 3.4  --   --   --   --   --   --   HGB 8.3*  < > 7.4* 7.9* 9.4* 9.0* 8.9*  HCT 24.3*  < > 21.5* 23.5* 27.3* 26.1* 26.7*  MCV 96.4  < > 96.4 97.5 92.9 94.2 95.0  PLT 236  < > 192 204 222 217 223  < > = values in this interval not displayed. Cardiac Enzymes:  Recent Labs Lab 03/30/14 2015  TROPONINI <0.30   BNP: BNP (last 3 results)  Recent Labs  12/18/13 1635 03/05/14 0938 03/30/14 2015  PROBNP 5296.0* 573.0* 7170.0*   CBG:  Recent Labs Lab 04/05/14 0724 04/05/14 1150 04/05/14 1701 04/05/14 2134 04/06/14 0744  GLUCAP 150* 181* 185* 197* 131*       Signed:  Annaelle Kasel A  [facility] 04/06/2014,  10:18 AM

## 2014-04-06 NOTE — Progress Notes (Addendum)
SATURATION QUALIFICATIONS: (This note is used to comply with regulatory documentation for home oxygen)  Patient Saturations on Room Air at Rest = 94%  Patient Saturations on Room Air while Ambulating = 86%  Patient Saturations on 3 Liters of oxygen while Ambulating = 90%, on 4 L O2 while ambulating- 94%  Please briefly explain why patient needs home oxygen: Patient's oxygen saturations drop to 86% while ambulating on room air. Patient's O2 sats maintain at 90% while on 3 Liters of oxygen while ambulating

## 2014-04-06 NOTE — Progress Notes (Signed)
Subjective:  Clinically improved this am. Less SOB  Objective:  Temp:  [97.6 F (36.4 C)-98 F (36.7 C)] 98 F (36.7 C) (07/10 0628) Pulse Rate:  [67-91] 67 (07/10 0628) Resp:  [20] 20 (07/10 0628) BP: (117-132)/(60-73) 118/67 mmHg (07/10 0628) SpO2:  [91 %-95 %] 93 % (07/10 0628) Weight:  [175 lb 12.8 oz (79.742 kg)] 175 lb 12.8 oz (79.742 kg) (07/10 0628) Weight change: 1 lb 8 oz (0.68 kg)  Intake/Output from previous day: 07/09 0701 - 07/10 0700 In: 480 [P.O.:480] Out: 1600 [Urine:1600]  Intake/Output from this shift:    Physical Exam: General appearance: alert and no distress Neck: no adenopathy, no carotid bruit, no JVD, supple, symmetrical, trachea midline and thyroid not enlarged, symmetric, no tenderness/mass/nodules Lungs: clear to auscultation bilaterally Heart: irregularly irregular rhythm Extremities: extremities normal, atraumatic, no cyanosis or edema  Lab Results: Results for orders placed during the hospital encounter of 03/30/14 (from the past 48 hour(s))  GLUCOSE, CAPILLARY     Status: Abnormal   Collection Time    04/04/14  4:54 PM      Result Value Ref Range   Glucose-Capillary 165 (*) 70 - 99 mg/dL  GLUCOSE, CAPILLARY     Status: Abnormal   Collection Time    04/04/14  9:24 PM      Result Value Ref Range   Glucose-Capillary 241 (*) 70 - 99 mg/dL   Comment 1 Documented in Chart     Comment 2 Notify RN    PROTIME-INR     Status: Abnormal   Collection Time    04/05/14  4:42 AM      Result Value Ref Range   Prothrombin Time 29.0 (*) 11.6 - 15.2 seconds   INR 2.74 (*) 0.00 - 1.49  CBC     Status: Abnormal   Collection Time    04/05/14  4:42 AM      Result Value Ref Range   WBC 5.9  4.0 - 10.5 K/uL   RBC 2.77 (*) 4.22 - 5.81 MIL/uL   Hemoglobin 9.0 (*) 13.0 - 17.0 g/dL   HCT 26.1 (*) 39.0 - 52.0 %   MCV 94.2  78.0 - 100.0 fL   MCH 32.5  26.0 - 34.0 pg   MCHC 34.5  30.0 - 36.0 g/dL   RDW 21.0 (*) 11.5 - 15.5 %   Platelets 217  150 -  400 K/uL  BASIC METABOLIC PANEL     Status: Abnormal   Collection Time    04/05/14  4:42 AM      Result Value Ref Range   Sodium 138  137 - 147 mEq/L   Potassium 4.0  3.7 - 5.3 mEq/L   Chloride 101  96 - 112 mEq/L   CO2 22  19 - 32 mEq/L   Glucose, Bld 172 (*) 70 - 99 mg/dL   BUN 42 (*) 6 - 23 mg/dL   Creatinine, Ser 1.42 (*) 0.50 - 1.35 mg/dL   Calcium 10.1  8.4 - 10.5 mg/dL   GFR calc non Af Amer 47 (*) >90 mL/min   GFR calc Af Amer 55 (*) >90 mL/min   Comment: (NOTE)     The eGFR has been calculated using the CKD EPI equation.     This calculation has not been validated in all clinical situations.     eGFR's persistently <90 mL/min signify possible Chronic Kidney     Disease.   Anion gap 15  5 - 15  GLUCOSE, CAPILLARY     Status: Abnormal   Collection Time    04/05/14  7:24 AM      Result Value Ref Range   Glucose-Capillary 150 (*) 70 - 99 mg/dL  GLUCOSE, CAPILLARY     Status: Abnormal   Collection Time    04/05/14 11:50 AM      Result Value Ref Range   Glucose-Capillary 181 (*) 70 - 99 mg/dL  GLUCOSE, CAPILLARY     Status: Abnormal   Collection Time    04/05/14  5:01 PM      Result Value Ref Range   Glucose-Capillary 185 (*) 70 - 99 mg/dL  GLUCOSE, CAPILLARY     Status: Abnormal   Collection Time    04/05/14  9:34 PM      Result Value Ref Range   Glucose-Capillary 197 (*) 70 - 99 mg/dL   Comment 1 Documented in Chart     Comment 2 Notify RN    PROTIME-INR     Status: Abnormal   Collection Time    04/06/14  4:33 AM      Result Value Ref Range   Prothrombin Time 27.4 (*) 11.6 - 15.2 seconds   INR 2.55 (*) 0.00 - 1.49  CBC     Status: Abnormal   Collection Time    04/06/14  4:33 AM      Result Value Ref Range   WBC 6.4  4.0 - 10.5 K/uL   RBC 2.81 (*) 4.22 - 5.81 MIL/uL   Hemoglobin 8.9 (*) 13.0 - 17.0 g/dL   HCT 26.7 (*) 39.0 - 52.0 %   MCV 95.0  78.0 - 100.0 fL   MCH 31.7  26.0 - 34.0 pg   MCHC 33.3  30.0 - 36.0 g/dL   RDW 21.0 (*) 11.5 - 15.5 %    Platelets 223  150 - 400 K/uL  GLUCOSE, CAPILLARY     Status: Abnormal   Collection Time    04/06/14  7:44 AM      Result Value Ref Range   Glucose-Capillary 131 (*) 70 - 99 mg/dL    Imaging: Imaging results have been reviewed  Assessment/Plan:   1. Active Problems: 2.   HYPERTENSION, BENIGN 3.   CAP (community acquired pneumonia) 4.   Anemia of chronic disease 5.    Goodpasture's disease, treated with steroid, oral cytoxan and plasmapheresis   6.   Acute on chronic diastolic HF (heart failure) in combination with rapid a flutter and anemia 7.   CKD (chronic kidney disease) stage 3, GFR 30-59 ml/min 8.   Sepsis 9.   Atrial flutter with rapid ventricular response 10.   Ejection fraction 11.   Warfarin anticoagulation 12.   Time Spent Directly with Patient:  20 minutes  Length of Stay:  LOS: 7 days   Looks much better this AM. Less SOB probably as a result of steroids and ATBX, and blood transfusion.Hgb Stable. INR therapeutic  I/O -5l. CCB dose increased yesterday to 60 mg PO q6 hrs (240 mg daily). HR moderately improved . Can consolidate Cardizem to CD 240 mg q day at D/C. F/U with Dr. Irish Lack.   Lorretta Harp 04/06/2014, 7:54 AM

## 2014-04-06 NOTE — Progress Notes (Signed)
ANTICOAGULATION CONSULT NOTE - Follow Up Consult  Pharmacy Consult for Apixaban (Eliquis) Indication: Aflutter, afib, h/o VTE  No Known Allergies  Patient Measurements: Height: 5\' 10"  (177.8 cm) Weight: 175 lb 12.8 oz (79.742 kg) IBW/kg (Calculated) : 73    Vital Signs: Temp: 98 F (36.7 C) (07/10 0628) Temp src: Oral (07/10 0628) BP: 112/71 mmHg (07/10 0927) Pulse Rate: 98 (07/10 0927)  Labs:  Recent Labs  04/04/14 0425 04/05/14 0442 04/06/14 0433  HGB 9.4* 9.0* 8.9*  HCT 27.3* 26.1* 26.7*  PLT 222 217 223  LABPROT 24.0* 29.0* 27.4*  INR 2.15* 2.74* 2.55*  CREATININE 1.19 1.42*  --     Estimated Creatinine Clearance: 47.1 ml/min (by C-G formula based on Cr of 1.42).  Assessment: 73 yr male presents 7/3 with fever.  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  Pt on warfarin PTA for history of Afib and PE.  Pharmacy was asked to continue dosing of warfarin upon admission, with heparin drip until INR therapeutic.  On 7/8, Pharmacy consulted to dose apixaban (Eliquis) for new PE (clinically suspected d/t perfusion defect on VQ) and warfarin failure.  Warfarin d/c'd - last dose 7/7.  Waiting to begin apixaban once INR < 2.    7/10: INR remains > 2.    Renal function: SCr bumped overnight, history of Goodpasture's syndrome; Dr. Posey Pronto, his nephrologist,  recommends Eliquis.  No BMET today but there would be no renal adj as age <33 yo and wt > 60kg.  CBC: Hgb improved and pltc stable and WNL  Goal of Therapy:  INR 2-3   Plan:   MD has advised pt to start Apixaban on 7/12 after 2 more days off warfarin to allow INR to fall <2.    Usual VTE treatment dose of Apixaban is 10mg  PO BID for 7 days, then 5mg  PO BID, however MD wants to start with 5mg  BID dosing.    Education with patient and family completed   Ralene Bathe, PharmD, BCPS 04/06/2014, 10:57 AM  Pager: [phone]

## 2014-04-06 NOTE — Progress Notes (Signed)
Discharge instructions gone over with patient and patient's wife. When going over medications, realized that prescription for Levaquin 750 mg PO was missing and patient also had questions about his dosage of prednisone. Dr. Hartford Poli made paged, prescription for Levaquin 750 mg printed and 60 mg prednisone daily ordered for patient by Dr. Hartford Poli per note by pulmonary MD. Discharge instructions and medications went over again with patient and patient's wife and they had no further questions. Patient hooked up to portable oxygen tank and taken downstairs to the front entrance of hospital via wheelchair by [place], NT. Patient transported to home by car.

## 2014-04-09 ENCOUNTER — Telehealth: Payer: Self-pay | Admitting: Pulmonary Disease

## 2014-04-09 NOTE — Telephone Encounter (Signed)
Pt has pending HFU Dr. Lake Bells 04/25/14. Pt reports he changed to eliquis on Sunday morn as requested from hospital. Pt reports after that, later the day he noticed blood in urine about 4 times. The urine has now cleared up this AM. Has no other symptoms/complaints at this time. He wants to know if this could have been from eliquis. Please advise BQ thanks

## 2014-04-09 NOTE — Telephone Encounter (Signed)
Yes, tell him to keep taking eliquis but to let us know if the problem recurs.  It may have been related to a urinary catheter from the hospital if he had one

## 2014-04-09 NOTE — Telephone Encounter (Signed)
[facility]C PT line rang numerous times, NA and no VM WCB

## 2014-04-10 ENCOUNTER — Encounter (HOSPITAL_COMMUNITY): Payer: Self-pay

## 2014-04-10 ENCOUNTER — Encounter (HOSPITAL_COMMUNITY)
Admission: RE | Admit: 2014-04-10 | Discharge: 2014-04-10 | Disposition: A | Discharge status: Home / Self Care | Payer: Medicare Other | Source: Ambulatory Visit | Attending: Nephrology | Admitting: Nephrology

## 2014-04-10 VITALS — BP 118/71 | HR 80 | Temp 98.5°F | Resp 18 | Ht 70.0 in | Wt 175.0 lb

## 2014-04-10 DIAGNOSIS — D638 Anemia in other chronic diseases classified elsewhere: Secondary | ICD-10-CM | POA: Insufficient documentation

## 2014-04-10 LAB — HEMOGLOBIN: Hemoglobin: 10.3 g/dL — ABNORMAL LOW (ref 13.0–17.0)

## 2014-04-10 MED ORDER — EPOETIN ALFA 40000 UNIT/ML IJ SOLN
30000.0000 [IU] | INTRAMUSCULAR | Status: DC
  Filled 2014-04-10: qty 1

## 2014-04-10 NOTE — Telephone Encounter (Signed)
Pt advised. Shayleen Eppinger, CMA  

## 2014-04-11 ENCOUNTER — Telehealth: Payer: Self-pay | Admitting: Interventional Cardiology

## 2014-04-11 NOTE — Telephone Encounter (Signed)
lmtrc

## 2014-04-11 NOTE — Telephone Encounter (Signed)
°  Patient has questions regarding medication. Please call and advise.

## 2014-04-12 NOTE — Telephone Encounter (Signed)
Spoke with pt and he just took his BP and it was 98/59. His BP runs anywhere from 105-90/60-50's. Pts HR runs in the 60's most of the time.Pt is still having lightheadedness/dizziness most of the time while walking around. He is changing positions slowly. Pt denies syncope.

## 2014-04-13 NOTE — Telephone Encounter (Signed)
Decrease metoprolol to 25 mg BID. Informed patient.

## 2014-04-16 MED ORDER — METOPROLOL TARTRATE 50 MG PO TABS: 25.0000 mg | ORAL_TABLET | Freq: Two times a day (BID) | ORAL | Status: AC

## 2014-04-16 NOTE — Telephone Encounter (Signed)
Noted, meds updated.

## 2014-04-17 ENCOUNTER — Emergency Department (HOSPITAL_COMMUNITY): Payer: Medicare Other

## 2014-04-17 ENCOUNTER — Inpatient Hospital Stay (HOSPITAL_COMMUNITY)
Admission: EM | Admit: 2014-04-17 | Discharge: 2014-05-29 | DRG: 545 | Disposition: E | Payer: Medicare Other | Attending: Internal Medicine | Admitting: Internal Medicine

## 2014-04-17 ENCOUNTER — Encounter (HOSPITAL_COMMUNITY): Payer: Self-pay | Admitting: Emergency Medicine

## 2014-04-17 DIAGNOSIS — T380X5A Adverse effect of glucocorticoids and synthetic analogues, initial encounter: Secondary | ICD-10-CM | POA: Diagnosis present

## 2014-04-17 DIAGNOSIS — M549 Dorsalgia, unspecified: Secondary | ICD-10-CM | POA: Diagnosis present

## 2014-04-17 DIAGNOSIS — IMO0002 Reserved for concepts with insufficient information to code with codable children: Secondary | ICD-10-CM | POA: Diagnosis not present

## 2014-04-17 DIAGNOSIS — I82409 Acute embolism and thrombosis of unspecified deep veins of unspecified lower extremity: Secondary | ICD-10-CM

## 2014-04-17 DIAGNOSIS — J45909 Unspecified asthma, uncomplicated: Secondary | ICD-10-CM | POA: Diagnosis present

## 2014-04-17 DIAGNOSIS — F3289 Other specified depressive episodes: Secondary | ICD-10-CM | POA: Diagnosis present

## 2014-04-17 DIAGNOSIS — E872 Acidosis, unspecified: Secondary | ICD-10-CM | POA: Diagnosis present

## 2014-04-17 DIAGNOSIS — M129 Arthropathy, unspecified: Secondary | ICD-10-CM | POA: Diagnosis present

## 2014-04-17 DIAGNOSIS — M310 Hypersensitivity angiitis: Secondary | ICD-10-CM | POA: Diagnosis present

## 2014-04-17 DIAGNOSIS — I509 Heart failure, unspecified: Secondary | ICD-10-CM | POA: Diagnosis present

## 2014-04-17 DIAGNOSIS — Z515 Encounter for palliative care: Secondary | ICD-10-CM | POA: Diagnosis not present

## 2014-04-17 DIAGNOSIS — N183 Chronic kidney disease, stage 3 unspecified: Secondary | ICD-10-CM | POA: Diagnosis present

## 2014-04-17 DIAGNOSIS — D696 Thrombocytopenia, unspecified: Secondary | ICD-10-CM

## 2014-04-17 DIAGNOSIS — E875 Hyperkalemia: Secondary | ICD-10-CM

## 2014-04-17 DIAGNOSIS — I428 Other cardiomyopathies: Secondary | ICD-10-CM | POA: Diagnosis present

## 2014-04-17 DIAGNOSIS — G4700 Insomnia, unspecified: Secondary | ICD-10-CM

## 2014-04-17 DIAGNOSIS — Z66 Do not resuscitate: Secondary | ICD-10-CM | POA: Diagnosis not present

## 2014-04-17 DIAGNOSIS — I214 Non-ST elevation (NSTEMI) myocardial infarction: Secondary | ICD-10-CM | POA: Insufficient documentation

## 2014-04-17 DIAGNOSIS — D638 Anemia in other chronic diseases classified elsewhere: Secondary | ICD-10-CM | POA: Diagnosis present

## 2014-04-17 DIAGNOSIS — F329 Major depressive disorder, single episode, unspecified: Secondary | ICD-10-CM | POA: Diagnosis present

## 2014-04-17 DIAGNOSIS — I129 Hypertensive chronic kidney disease with stage 1 through stage 4 chronic kidney disease, or unspecified chronic kidney disease: Secondary | ICD-10-CM | POA: Diagnosis present

## 2014-04-17 DIAGNOSIS — Z86711 Personal history of pulmonary embolism: Secondary | ICD-10-CM

## 2014-04-17 DIAGNOSIS — W19XXXA Unspecified fall, initial encounter: Secondary | ICD-10-CM | POA: Diagnosis present

## 2014-04-17 DIAGNOSIS — I5043 Acute on chronic combined systolic (congestive) and diastolic (congestive) heart failure: Secondary | ICD-10-CM | POA: Diagnosis present

## 2014-04-17 DIAGNOSIS — Z7901 Long term (current) use of anticoagulants: Secondary | ICD-10-CM

## 2014-04-17 DIAGNOSIS — N189 Chronic kidney disease, unspecified: Secondary | ICD-10-CM

## 2014-04-17 DIAGNOSIS — R0689 Other abnormalities of breathing: Secondary | ICD-10-CM

## 2014-04-17 DIAGNOSIS — I2510 Atherosclerotic heart disease of native coronary artery without angina pectoris: Secondary | ICD-10-CM | POA: Diagnosis present

## 2014-04-17 DIAGNOSIS — I10 Essential (primary) hypertension: Secondary | ICD-10-CM | POA: Diagnosis present

## 2014-04-17 DIAGNOSIS — I951 Orthostatic hypotension: Secondary | ICD-10-CM | POA: Diagnosis present

## 2014-04-17 DIAGNOSIS — I4891 Unspecified atrial fibrillation: Secondary | ICD-10-CM | POA: Diagnosis present

## 2014-04-17 DIAGNOSIS — I5031 Acute diastolic (congestive) heart failure: Secondary | ICD-10-CM

## 2014-04-17 DIAGNOSIS — Z87891 Personal history of nicotine dependence: Secondary | ICD-10-CM

## 2014-04-17 DIAGNOSIS — I2789 Other specified pulmonary heart diseases: Secondary | ICD-10-CM | POA: Diagnosis present

## 2014-04-17 DIAGNOSIS — I7782 Antineutrophilic cytoplasmic antibody (ANCA) vasculitis: Secondary | ICD-10-CM | POA: Diagnosis present

## 2014-04-17 DIAGNOSIS — N058 Unspecified nephritic syndrome with other morphologic changes: Secondary | ICD-10-CM | POA: Diagnosis present

## 2014-04-17 DIAGNOSIS — I4892 Unspecified atrial flutter: Secondary | ICD-10-CM | POA: Diagnosis present

## 2014-04-17 DIAGNOSIS — E876 Hypokalemia: Secondary | ICD-10-CM | POA: Diagnosis not present

## 2014-04-17 DIAGNOSIS — R5381 Other malaise: Secondary | ICD-10-CM | POA: Diagnosis present

## 2014-04-17 DIAGNOSIS — R7989 Other specified abnormal findings of blood chemistry: Secondary | ICD-10-CM

## 2014-04-17 DIAGNOSIS — J9620 Acute and chronic respiratory failure, unspecified whether with hypoxia or hypercapnia: Secondary | ICD-10-CM | POA: Diagnosis present

## 2014-04-17 DIAGNOSIS — B370 Candidal stomatitis: Secondary | ICD-10-CM

## 2014-04-17 DIAGNOSIS — E785 Hyperlipidemia, unspecified: Secondary | ICD-10-CM | POA: Diagnosis present

## 2014-04-17 DIAGNOSIS — R0902 Hypoxemia: Secondary | ICD-10-CM

## 2014-04-17 DIAGNOSIS — N059 Unspecified nephritic syndrome with unspecified morphologic changes: Secondary | ICD-10-CM | POA: Diagnosis present

## 2014-04-17 DIAGNOSIS — R0600 Dyspnea, unspecified: Secondary | ICD-10-CM

## 2014-04-17 DIAGNOSIS — K921 Melena: Secondary | ICD-10-CM

## 2014-04-17 DIAGNOSIS — Z9981 Dependence on supplemental oxygen: Secondary | ICD-10-CM

## 2014-04-17 DIAGNOSIS — J9600 Acute respiratory failure, unspecified whether with hypoxia or hypercapnia: Secondary | ICD-10-CM

## 2014-04-17 DIAGNOSIS — I272 Pulmonary hypertension, unspecified: Secondary | ICD-10-CM

## 2014-04-17 DIAGNOSIS — J9621 Acute and chronic respiratory failure with hypoxia: Secondary | ICD-10-CM | POA: Diagnosis present

## 2014-04-17 DIAGNOSIS — J9601 Acute respiratory failure with hypoxia: Secondary | ICD-10-CM

## 2014-04-17 DIAGNOSIS — I482 Chronic atrial fibrillation, unspecified: Secondary | ICD-10-CM

## 2014-04-17 DIAGNOSIS — R319 Hematuria, unspecified: Secondary | ICD-10-CM | POA: Diagnosis present

## 2014-04-17 DIAGNOSIS — Z8249 Family history of ischemic heart disease and other diseases of the circulatory system: Secondary | ICD-10-CM

## 2014-04-17 DIAGNOSIS — E43 Unspecified severe protein-calorie malnutrition: Secondary | ICD-10-CM | POA: Insufficient documentation

## 2014-04-17 DIAGNOSIS — J189 Pneumonia, unspecified organism: Secondary | ICD-10-CM

## 2014-04-17 DIAGNOSIS — Z86718 Personal history of other venous thrombosis and embolism: Secondary | ICD-10-CM | POA: Diagnosis not present

## 2014-04-17 DIAGNOSIS — F411 Generalized anxiety disorder: Secondary | ICD-10-CM | POA: Diagnosis present

## 2014-04-17 DIAGNOSIS — M109 Gout, unspecified: Secondary | ICD-10-CM | POA: Diagnosis present

## 2014-04-17 DIAGNOSIS — J8410 Pulmonary fibrosis, unspecified: Secondary | ICD-10-CM | POA: Diagnosis present

## 2014-04-17 DIAGNOSIS — R0602 Shortness of breath: Secondary | ICD-10-CM | POA: Diagnosis present

## 2014-04-17 DIAGNOSIS — I5021 Acute systolic (congestive) heart failure: Secondary | ICD-10-CM

## 2014-04-17 DIAGNOSIS — J849 Interstitial pulmonary disease, unspecified: Secondary | ICD-10-CM

## 2014-04-17 DIAGNOSIS — R778 Other specified abnormalities of plasma proteins: Secondary | ICD-10-CM

## 2014-04-17 DIAGNOSIS — N179 Acute kidney failure, unspecified: Secondary | ICD-10-CM | POA: Diagnosis not present

## 2014-04-17 DIAGNOSIS — R109 Unspecified abdominal pain: Secondary | ICD-10-CM

## 2014-04-17 DIAGNOSIS — I776 Arteritis, unspecified: Secondary | ICD-10-CM

## 2014-04-17 LAB — CBC WITH DIFFERENTIAL/PLATELET
BASOS PCT: 0 % (ref 0–1)
Basophils Absolute: 0 10*3/uL (ref 0.0–0.1)
EOS PCT: 1 % (ref 0–5)
Eosinophils Absolute: 0.1 10*3/uL (ref 0.0–0.7)
HCT: 31.7 % — ABNORMAL LOW (ref 39.0–52.0)
HEMOGLOBIN: 10.5 g/dL — AB (ref 13.0–17.0)
LYMPHS PCT: 2 % — AB (ref 12–46)
Lymphs Abs: 0.1 10*3/uL — ABNORMAL LOW (ref 0.7–4.0)
MCH: 32.3 pg (ref 26.0–34.0)
MCHC: 33.1 g/dL (ref 30.0–36.0)
MCV: 97.5 fL (ref 78.0–100.0)
Monocytes Absolute: 0.1 10*3/uL (ref 0.1–1.0)
Monocytes Relative: 2 % — ABNORMAL LOW (ref 3–12)
NEUTROS PCT: 95 % — AB (ref 43–77)
Neutro Abs: 4.9 10*3/uL (ref 1.7–7.7)
PLATELETS: 167 10*3/uL (ref 150–400)
RBC: 3.25 MIL/uL — AB (ref 4.22–5.81)
RDW: 21.7 % — ABNORMAL HIGH (ref 11.5–15.5)
WBC: 5.2 10*3/uL (ref 4.0–10.5)

## 2014-04-17 LAB — COMPREHENSIVE METABOLIC PANEL
ALT: 38 U/L (ref 0–53)
AST: 37 U/L (ref 0–37)
Albumin: 2.6 g/dL — ABNORMAL LOW (ref 3.5–5.2)
Alkaline Phosphatase: 61 U/L (ref 39–117)
Anion gap: 15 (ref 5–15)
BILIRUBIN TOTAL: 1.5 mg/dL — AB (ref 0.3–1.2)
BUN: 30 mg/dL — ABNORMAL HIGH (ref 6–23)
CALCIUM: 8.9 mg/dL (ref 8.4–10.5)
CHLORIDE: 89 meq/L — AB (ref 96–112)
CO2: 26 meq/L (ref 19–32)
Creatinine, Ser: 1.44 mg/dL — ABNORMAL HIGH (ref 0.50–1.35)
GFR calc Af Amer: 54 mL/min — ABNORMAL LOW (ref >90)
GFR calc non Af Amer: 46 mL/min — ABNORMAL LOW (ref >90)
Glucose, Bld: 83 mg/dL (ref 70–99)
Potassium: 4.7 mEq/L (ref 3.7–5.3)
SODIUM: 130 meq/L — AB (ref 137–147)
Total Protein: 5.8 g/dL — ABNORMAL LOW (ref 6.0–8.3)

## 2014-04-17 LAB — I-STAT ARTERIAL BLOOD GAS, ED
Acid-Base Excess: 3 mmol/L — ABNORMAL HIGH (ref 0.0–2.0)
Bicarbonate: 24.7 mEq/L — ABNORMAL HIGH (ref 20.0–24.0)
O2 Saturation: 81 %
TCO2: 26 mmol/L (ref 0–100)
pCO2 arterial: 29.7 mmHg — ABNORMAL LOW (ref 35.0–45.0)
pH, Arterial: 7.529 — ABNORMAL HIGH (ref 7.350–7.450)
pO2, Arterial: 40 mmHg — ABNORMAL LOW (ref 80.0–100.0)

## 2014-04-17 LAB — I-STAT TROPONIN, ED: Troponin i, poc: 0.11 ng/mL (ref 0.00–0.08)

## 2014-04-17 LAB — PROTIME-INR
INR: 1.36 (ref 0.00–1.49)
PROTHROMBIN TIME: 16.8 s — AB (ref 11.6–15.2)

## 2014-04-17 LAB — PRO B NATRIURETIC PEPTIDE: Pro B Natriuretic peptide (BNP): 6768 pg/mL — ABNORMAL HIGH (ref 0–125)

## 2014-04-17 MED ORDER — ASPIRIN 325 MG PO TABS
325.0000 mg | ORAL_TABLET | Freq: Once | ORAL | Status: AC
  Administered 2014-04-17: 325 mg via ORAL
  Filled 2014-04-17: qty 1

## 2014-04-17 MED ORDER — SODIUM CHLORIDE 0.9 % IV BOLUS (SEPSIS): 500.0000 mL | Freq: Once | INTRAVENOUS | Status: DC

## 2014-04-17 MED ORDER — FUROSEMIDE 10 MG/ML IJ SOLN
40.0000 mg | Freq: Once | INTRAMUSCULAR | Status: AC
  Administered 2014-04-18: 40 mg via INTRAVENOUS
  Filled 2014-04-17: qty 4

## 2014-04-17 NOTE — ED Notes (Signed)
Per [facility] pt from home reports intermittent dizziness x2 weeks and SOB 2 days that increased more than normal due to pt has a hx of Good Pasteur Disease and is always SOB. Pt has poor O2 readings, pt was originally pale but after EMS set him up in the bed and repositioned him, his color returned. 22 LFA

## 2014-04-17 NOTE — ED Notes (Signed)
Dr. Yao at bedside. 

## 2014-04-17 NOTE — ED Notes (Signed)
Admitting physician at bedside

## 2014-04-17 NOTE — ED Notes (Signed)
MD Yao at bedside 

## 2014-04-17 NOTE — ED Notes (Signed)
MD Claiborne Billings with Cardiology at bedside.

## 2014-04-17 NOTE — H&P (Signed)
PCP:   [name], MD   Chief Complaint:  Sob and dizziness  HPI: 73 yo male h/o diastolic chf, aflutter, vte, goodpastures, ckd, oxygen dep on 2 Liters at home comes in with several days of worsening sob and swelling in his legs along with getting very dizzy and weak every time he stands up.  He called his cardiologist a couple of days ago about this and he was instructed to decrease his metoprolol to 25mg  po bid which he did.  But his symptoms have gotten worse anyway.  No fevers.  No orthopnea or pnd.  No cough.  He was recently in the hospital for chf exac and his legs are more swollen now than then.  Denies any wt gain, he weights himself almost daily.  No chest pain.  No palpitations.  His initial sbp were in the upper 80s and upon standing this drops into the 70s.  He takes lasix 20 mg daily, and is compliant with this.  He has also had to increase his oxygen at home from 2 L to 4 Liters.  Review of Systems:  Positive and negative as per HPI otherwise all other systems are negative  Past Medical History: Past Medical History  Diagnosis Date  . Atrial flutter started in 2011  . Chronic diastolic heart failure   . DVT   . HYPERLIPIDEMIA   . HYPERTENSION, BENIGN   . ALLERGIC ASTHMA   . Palpitations   . H/O Goodpasture's syndrome     "autoimmune disease; affects kidneys and lungs"  . DVT (deep venous thrombosis) 09/2001    "right groin"  . PE (pulmonary embolism) 09/2001  . CHF (congestive heart failure)   . Anemia   . History of blood transfusion     "related to Goodpasture's syndrome; plasmapheresis process"  . Stomach ulcer   . Arthritis     "hands, knees" (03/16/2014)  . Gout   . Renal insufficiency     "related to the Goodpasture's"  . Atrial flutter with rapid ventricular response 03/16/2014  . Acute on chronic diastolic HF (heart failure) in combination with rapid a flutter and anemia 03/18/2014  . Ejection fraction    Past Surgical History  Procedure Laterality  Date  . Renal biopsy, percutaneous  12/2013  . Vena cava filter placement  09/2001    Medications: Prior to Admission medications   Medication Sig Start Date End Date Taking? Authorizing Provider  apixaban (ELIQUIS) 5 MG TABS tablet Take 1 tablet (5 mg total) by mouth 2 (two) times daily. 04/06/14  Yes Mutaz Elmahi, MD  bimatoprost (LUMIGAN) 0.01 % SOLN Place 1 drop into both eyes every morning.   Yes Historical Provider, MD  cyanocobalamin (,VITAMIN B-12,) 1000 MCG/ML injection Inject 1,000 mcg into the muscle every 30 (thirty) days.    Yes Historical Provider, MD  cyclobenzaprine (FLEXERIL) 5 MG tablet Take 5 mg by mouth at bedtime.   Yes Historical Provider, MD  cyclophosphamide (CYTOXAN) 50 MG tablet Take 100 mg by mouth daily. Give on an empty stomach 1 hour before or 2 hours after meals.   Yes Historical Provider, MD  diltiazem (CARDIZEM CD) 240 MG 24 hr capsule Take 1 capsule (240 mg total) by mouth daily. 04/06/14  Yes Verlee Monte, MD  Dutasteride-Tamsulosin HCl 0.5-0.4 MG CAPS Take 1 capsule by mouth daily.   Yes Historical Provider, MD  furosemide (LASIX) 20 MG tablet Take 1 tablet (20 mg total) by mouth daily. 12/30/13  Yes Belkys A Regalado, MD  metoprolol (  LOPRESSOR) 50 MG tablet Take 0.5 tablets (25 mg total) by mouth 2 (two) times daily. 04/16/14  Yes Jettie Booze, MD  montelukast (SINGULAIR) 10 MG tablet Take 10 mg by mouth at bedtime.  06/10/13  Yes Historical Provider, MD  omeprazole (PRILOSEC) 20 MG capsule Take 20 mg by mouth daily.  08/17/13  Yes Historical Provider, MD  predniSONE (DELTASONE) 20 MG tablet Take 3 tablets (60 mg total) by mouth daily with breakfast. 04/06/14  Yes Verlee Monte, MD  traMADol (ULTRAM) 50 MG tablet Take 1 tablet (50 mg total) by mouth every 12 (twelve) hours as needed. 12/30/13  Yes Belkys A Regalado, MD  Vitamin D, Cholecalciferol, 1000 UNITS TABS Take 2,000 Units by mouth daily.    Yes Historical Provider, MD    Allergies:  No Known  Allergies  Social History:  reports that he quit smoking about 46 years ago. His smoking use included Cigarettes. He has a 6 pack-year smoking history. He has never used smokeless tobacco. He reports that he drinks about 8.4 ounces of alcohol per week. He reports that he does not use illicit drugs.  Family History: Family History  Problem Relation Age of Onset  . Stroke Mother   . Heart attack Maternal Grandmother   . Coronary artery disease      negative history of early CAD    Physical Exam: Filed Vitals:   04/09/2014 2130 04/14/2014 2136 04/19/2014 2217 04/22/2014 2302  BP: 106/60   101/58  Pulse: 90   100  Temp: 99.2 F (37.3 C) 99.2 F (37.3 C)    TempSrc: Oral     Resp: 35   24  Height:      Weight:      SpO2: 91%  95% 97%   General appearance: alert, cooperative and moderate distress tachypneic but able to speak in truncated sentences with good color Head: Normocephalic, without obvious abnormality, atraumatic Eyes: negative Nose: Nares normal. Septum midline. Mucosa normal. No drainage or sinus tenderness. Neck: no JVD and supple, symmetrical, trachea midline Lungs: clear to auscultation bilaterally Heart: irregularly irregular rhythm Abdomen: soft, non-tender; bowel sounds normal; no masses,  no organomegaly Extremities: edema 1-2+ Pulses: 2+ and symmetric Skin: Skin color, texture, turgor normal. No rashes or lesions Neurologic: Grossly normal  Labs on Admission:   Recent Labs  04/06/2014 2027  NA 130*  K 4.7  CL 89*  CO2 26  GLUCOSE 83  BUN 30*  CREATININE 1.44*  CALCIUM 8.9    Recent Labs  04/26/2014 2027  AST 37  ALT 38  ALKPHOS 61  BILITOT 1.5*  PROT 5.8*  ALBUMIN 2.6*    Recent Labs  04/12/2014 2027  WBC 5.2  NEUTROABS 4.9  HGB 10.5*  HCT 31.7*  MCV 97.5  PLT 167   Radiological Exams on Admission: Dg Chest 2 View  04/25/2014   CLINICAL DATA:  Dizziness, shortness of breath and congestion.  EXAM: CHEST  2 VIEW  COMPARISON:  Chest  radiograph and CT of the chest performed 03/30/2014  FINDINGS: The lungs are well-aerated. Acutely worsened diffuse bilateral airspace opacification is noted, with a peripheral distribution and diffusely increased interstitial markings. This may reflect an acute infectious or inflammatory interstitial pneumonitis. This is superimposed on the patient's underlying chronic interstitial changes. Pulmonary edema is considered less likely, given the unusual distribution. There is no evidence of pleural effusion or pneumothorax.  The heart is borderline enlarged. No acute osseous abnormalities are seen.  IMPRESSION: 1. Acutely worsened diffuse bilateral  airspace opacification, with a peripheral distribution and diffusely increased interstitial markings. This may reflect an acute infectious or inflammatory interstitial pneumonitis, and is superimposed on the patient's underlying chronic interstitial changes. Pulmonary edema is considered less likely, given the unusual distribution. 2. Borderline cardiomegaly.   Electronically Signed   By: Garald Balding M.D.   On: 03/30/2014 21:43   Assessment/Plan  73 yo male with acute on chronic respiratory hypoxic failure due to acute chf exacerbation and probable nstemi.  Principal Problem:   CHF, acute on chronic-  Cardiology dr Claiborne Billings recommended lasix 40 mg iv once.  Place on chf pathway.  Cardiology to see tonight.  Place pt in stepdown.  Oxygen supplementation, is on nrb mask now.  Management per cards team.  Serial cardiac enzymes ( may be demand ischemia), hold off on any further anticoagulation for now until seen by cardiology.  Active Problems:  Stable unless o/w noted   Atrial fibrillation-  Rate 90-100   Anemia of chronic disease   ANCA-associated vasculitis    Goodpasture's disease, treated with steroid, oral cytoxan and plasmapheresis     CKD (chronic kidney disease) stage 3, GFR 30-59 ml/min   SOB (shortness of breath)   Orthostatic hypotension   Acute on  chronic respiratory failure with hypoxia   Supplemental oxygen dependent     NSTEMI (non-ST elevated myocardial infarction)  Admit to stepdown.  Full code.  Is suppose to get procrit tomorrow at [facility], this will have to be rescheduled.  Wife will call in am and let them know he has been hospitalized.  Deneshia Zucker A 04/27/2014, 11:03 PM

## 2014-04-17 NOTE — ED Provider Notes (Signed)
CSN: [id number]     Arrival date & time 04/16/2014  1954 History   First MD Initiated Contact with Patient 04/26/2014 1956     Chief Complaint  Patient presents with  . Dizziness  . Shortness of Breath     (Consider location/radiation/quality/duration/timing/severity/associated sxs/prior Treatment) The history is provided by the patient.  Bryan Hubbard is a 73 y.o. male hx of diastolic CHF, HL, HTN, aflutter on eliquis, good pastures here with light headedness and dizziness and shortness of breath. Recently admitted for respiratory failure thought likely from CHF vs HCAP. Finished abx, felt better after discharge. For the last 4 days, had worsening shortness of breath. Also feels light headed and dizzy like he is going to pass out. Denies chest pain.    Past Medical History  Diagnosis Date  . Atrial flutter started in 2011  . Chronic diastolic heart failure   . DVT   . HYPERLIPIDEMIA   . HYPERTENSION, BENIGN   . ALLERGIC ASTHMA   . Palpitations   . H/O Goodpasture's syndrome     "autoimmune disease; affects kidneys and lungs"  . DVT (deep venous thrombosis) 09/2001    "right groin"  . PE (pulmonary embolism) 09/2001  . CHF (congestive heart failure)   . Anemia   . History of blood transfusion     "related to Goodpasture's syndrome; plasmapheresis process"  . Stomach ulcer   . Arthritis     "hands, knees" (03/16/2014)  . Gout   . Renal insufficiency     "related to the Goodpasture's"  . Atrial flutter with rapid ventricular response 03/16/2014  . Acute on chronic diastolic HF (heart failure) in combination with rapid a flutter and anemia 03/18/2014  . Ejection fraction    Past Surgical History  Procedure Laterality Date  . Renal biopsy, percutaneous  12/2013  . Vena cava filter placement  09/2001   Family History  Problem Relation Age of Onset  . Stroke Mother   . Heart attack Maternal Grandmother   . Coronary artery disease      negative history of early CAD   History   Substance Use Topics  . Smoking status: Former Smoker -- 1.00 packs/day for 6 years    Types: Cigarettes    Quit date: 05/02/1967  . Smokeless tobacco: Never Used  . Alcohol Use: 8.4 oz/week    14 Glasses of wine per week     Comment: drink 1 drink a day    Review of Systems  Respiratory: Positive for shortness of breath.   Neurological: Positive for dizziness.  All other systems reviewed and are negative.     Allergies  Review of patient's allergies indicates no known allergies.  Home Medications   Prior to Admission medications   Medication Sig Start Date End Date Taking? Authorizing Provider  acyclovir (ZOVIRAX) 400 MG tablet Take 1 tablet (400 mg total) by mouth 3 (three) times daily. 04/06/14   Verlee Monte, MD  apixaban (ELIQUIS) 5 MG TABS tablet Take 1 tablet (5 mg total) by mouth 2 (two) times daily. 04/06/14   Verlee Monte, MD  bimatoprost (LUMIGAN) 0.01 % SOLN Place 1 drop into both eyes every morning.    Historical Provider, MD  cyanocobalamin (,VITAMIN B-12,) 1000 MCG/ML injection Inject 1,000 mcg into the muscle every 30 (thirty) days.     Historical Provider, MD  cyclobenzaprine (FLEXERIL) 5 MG tablet Take 5 mg by mouth at bedtime.    Historical Provider, MD  cyclophosphamide (CYTOXAN) 50 MG tablet  Take 100 mg by mouth daily. Give on an empty stomach 1 hour before or 2 hours after meals.    Historical Provider, MD  diltiazem (CARDIZEM CD) 240 MG 24 hr capsule Take 1 capsule (240 mg total) by mouth daily. 04/06/14   Verlee Monte, MD  Dutasteride-Tamsulosin HCl 0.5-0.4 MG CAPS Take 1 capsule by mouth daily.    Historical Provider, MD  furosemide (LASIX) 20 MG tablet Take 1 tablet (20 mg total) by mouth daily. 12/30/13   Belkys A Regalado, MD  levofloxacin (LEVAQUIN) 750 MG tablet Take 1 tablet (750 mg total) by mouth every other day. 04/06/14   Verlee Monte, MD  metoprolol (LOPRESSOR) 50 MG tablet Take 0.5 tablets (25 mg total) by mouth 2 (two) times daily. 04/16/14    Jettie Booze, MD  montelukast (SINGULAIR) 10 MG tablet Take 10 mg by mouth at bedtime.  06/10/13   Historical Provider, MD  omeprazole (PRILOSEC) 20 MG capsule Take 20 mg by mouth daily.  08/17/13   Historical Provider, MD  predniSONE (DELTASONE) 20 MG tablet Take 3 tablets (60 mg total) by mouth daily with breakfast. 04/06/14   Verlee Monte, MD  traMADol (ULTRAM) 50 MG tablet Take 1 tablet (50 mg total) by mouth every 12 (twelve) hours as needed. 12/30/13   Belkys A Regalado, MD  Vitamin D, Cholecalciferol, 1000 UNITS TABS Take 2,000 Units by mouth daily.     Historical Provider, MD   BP 108/67  Pulse 97  Temp(Src) 99.4 F (37.4 C) (Oral)  Resp 29  Ht 5\' 10"  (1.778 m)  Wt 175 lb (79.379 kg)  BMI 25.11 kg/m2  SpO2 93% Physical Exam  Nursing note and vitals reviewed. Constitutional: He is oriented to person, place, and time.  Chronically ill   HENT:  Head: Normocephalic.  Mouth/Throat: Oropharynx is clear and moist.  Eyes: Conjunctivae are normal. Pupils are equal, round, and reactive to light.  Neck: Normal range of motion. Neck supple.  Cardiovascular: Normal rate, regular rhythm and normal heart sounds.   Pulmonary/Chest:  Slightly tachypneic, crackles bilateral bases   Abdominal: Soft. Bowel sounds are normal. He exhibits no distension. There is no tenderness. There is no rebound.  Musculoskeletal: Normal range of motion.  1+ edema   Neurological: He is alert and oriented to person, place, and time. No cranial nerve deficit. Coordination normal.  Skin: Skin is warm and dry.  Psychiatric: He has a normal mood and affect. His behavior is normal. Judgment and thought content normal.    ED Course  Procedures (including critical care time) Labs Review Labs Reviewed  CBC WITH DIFFERENTIAL - Abnormal; Notable for the following:    RBC 3.25 (*)    Hemoglobin 10.5 (*)    HCT 31.7 (*)    RDW 21.7 (*)    All other components within normal limits  COMPREHENSIVE METABOLIC PANEL  - Abnormal; Notable for the following:    Sodium 130 (*)    Chloride 89 (*)    BUN 30 (*)    Creatinine, Ser 1.44 (*)    Total Protein 5.8 (*)    Albumin 2.6 (*)    Total Bilirubin 1.5 (*)    GFR calc non Af Amer 46 (*)    GFR calc Af Amer 54 (*)    All other components within normal limits  PRO B NATRIURETIC PEPTIDE - Abnormal; Notable for the following:    Pro B Natriuretic peptide (BNP) 6768.0 (*)    All other components within normal  limits  PROTIME-INR - Abnormal; Notable for the following:    Prothrombin Time 16.8 (*)    All other components within normal limits  I-STAT TROPOININ, ED - Abnormal; Notable for the following:    Troponin i, poc 0.11 (*)    All other components within normal limits    Imaging Review No results found.   EKG Interpretation   Date/Time:  Tuesday April 17 2014 20:23:09 EDT Ventricular Rate:  89 PR Interval:    QRS Duration: 110 QT Interval:  415 QTC Calculation: 505 R Axis:   34 Text Interpretation:  Atrial flutter/fibrillation Ventricular premature  complex Borderline ST elevation, lateral leads Borderline prolonged QT  interval Since last tracing rate slower Confirmed by YAO  MD, DAVID  ([id number]) on 04/03/2014 8:29:30 PM      MDM   Final diagnoses:  None   KYJUAN GAUSE is a 73 y.o. male here with SOB, light headedness. Slightly hypotensive on arrival. I was concerned for possible CHF vs ACS. Will get BNP, cxr, labs, orthostatics.   9:32 PM BP dec to 70s with standing up. Trop 0.11. BNP elevated. CXR showed CHF. I am concerned for CHF exacerbation. Given hypotension i was hesitant to diurese. I called Dr. Claiborne Billings from cardiology, who recommend lasix 40 mg IV. Since he has good pastures and Cr slightly elevated, recommend medicine readmission.     Wandra Arthurs, MD 03/31/2014 2211

## 2014-04-17 NOTE — ED Notes (Signed)
Patient transported to X-ray 

## 2014-04-17 NOTE — ED Notes (Signed)
RT at bedside.

## 2014-04-18 ENCOUNTER — Encounter (HOSPITAL_COMMUNITY): Admission: RE | Admit: 2014-04-18 | Payer: Medicare Other | Source: Ambulatory Visit

## 2014-04-18 DIAGNOSIS — I4892 Unspecified atrial flutter: Secondary | ICD-10-CM

## 2014-04-18 DIAGNOSIS — M310 Hypersensitivity angiitis: Principal | ICD-10-CM

## 2014-04-18 DIAGNOSIS — J9600 Acute respiratory failure, unspecified whether with hypoxia or hypercapnia: Secondary | ICD-10-CM

## 2014-04-18 DIAGNOSIS — R0602 Shortness of breath: Secondary | ICD-10-CM

## 2014-04-18 DIAGNOSIS — I509 Heart failure, unspecified: Secondary | ICD-10-CM

## 2014-04-18 DIAGNOSIS — N189 Chronic kidney disease, unspecified: Secondary | ICD-10-CM

## 2014-04-18 LAB — URINALYSIS, ROUTINE W REFLEX MICROSCOPIC
Bilirubin Urine: NEGATIVE
Glucose, UA: NEGATIVE mg/dL
Ketones, ur: NEGATIVE mg/dL
Leukocytes, UA: NEGATIVE
NITRITE: NEGATIVE
PROTEIN: 30 mg/dL — AB
SPECIFIC GRAVITY, URINE: 1.011 (ref 1.005–1.030)
Urobilinogen, UA: 1 mg/dL (ref 0.0–1.0)
pH: 6.5 (ref 5.0–8.0)

## 2014-04-18 LAB — BASIC METABOLIC PANEL
Anion gap: 12 (ref 5–15)
BUN: 32 mg/dL — AB (ref 6–23)
CHLORIDE: 92 meq/L — AB (ref 96–112)
CO2: 28 mEq/L (ref 19–32)
Calcium: 8.7 mg/dL (ref 8.4–10.5)
Creatinine, Ser: 1.55 mg/dL — ABNORMAL HIGH (ref 0.50–1.35)
GFR calc Af Amer: 49 mL/min — ABNORMAL LOW (ref >90)
GFR, EST NON AFRICAN AMERICAN: 42 mL/min — AB (ref >90)
GLUCOSE: 88 mg/dL (ref 70–99)
Potassium: 4.4 mEq/L (ref 3.7–5.3)
Sodium: 132 mEq/L — ABNORMAL LOW (ref 137–147)

## 2014-04-18 LAB — URINE MICROSCOPIC-ADD ON

## 2014-04-18 LAB — TROPONIN I
Troponin I: <0.3 ng/mL (ref <0.30)
Troponin I: <0.3 ng/mL (ref <0.30)

## 2014-04-18 MED ORDER — MONTELUKAST SODIUM 10 MG PO TABS
10.0000 mg | ORAL_TABLET | Freq: Every day | ORAL | Status: DC
  Administered 2014-04-21 – 2014-04-28 (×8): 10 mg via ORAL
  Filled 2014-04-18 (×18): qty 1

## 2014-04-18 MED ORDER — ACETAMINOPHEN 325 MG PO TABS
650.0000 mg | ORAL_TABLET | ORAL | Status: DC | PRN
  Filled 2014-04-18: qty 2

## 2014-04-18 MED ORDER — APIXABAN 5 MG PO TABS
5.0000 mg | ORAL_TABLET | Freq: Two times a day (BID) | ORAL | Status: DC
  Administered 2014-04-18 – 2014-04-23 (×12): 5 mg via ORAL
  Filled 2014-04-18 (×13): qty 1

## 2014-04-18 MED ORDER — CYANOCOBALAMIN 1000 MCG/ML IJ SOLN: 1000.0000 ug | INTRAMUSCULAR | Status: DC

## 2014-04-18 MED ORDER — CYCLOBENZAPRINE HCL 5 MG PO TABS
5.0000 mg | ORAL_TABLET | Freq: Every day | ORAL | Status: DC
  Administered 2014-04-18 – 2014-05-06 (×19): 5 mg via ORAL
  Filled 2014-04-18 (×25): qty 1

## 2014-04-18 MED ORDER — METOPROLOL TARTRATE 1 MG/ML IV SOLN
5.0000 mg | Freq: Four times a day (QID) | INTRAVENOUS | Status: DC
  Administered 2014-04-18 – 2014-04-20 (×6): 5 mg via INTRAVENOUS
  Filled 2014-04-18 (×13): qty 5

## 2014-04-18 MED ORDER — ONDANSETRON HCL 4 MG/2ML IJ SOLN
4.0000 mg | Freq: Four times a day (QID) | INTRAMUSCULAR | Status: DC | PRN
  Administered 2014-04-28 – 2014-05-05 (×2): 4 mg via INTRAVENOUS
  Filled 2014-04-18 (×2): qty 2

## 2014-04-18 MED ORDER — METHYLPREDNISOLONE SODIUM SUCC 125 MG IJ SOLR
125.0000 mg | Freq: Four times a day (QID) | INTRAMUSCULAR | Status: AC
  Administered 2014-04-18 – 2014-04-21 (×12): 125 mg via INTRAVENOUS
  Filled 2014-04-18 (×13): qty 2

## 2014-04-18 MED ORDER — DILTIAZEM HCL 100 MG IV SOLR
5.0000 mg/h | INTRAVENOUS | Status: DC
  Administered 2014-04-18: 5 mg/h via INTRAVENOUS

## 2014-04-18 MED ORDER — METOPROLOL TARTRATE 1 MG/ML IV SOLN
INTRAVENOUS | Status: AC
  Filled 2014-04-18: qty 5

## 2014-04-18 MED ORDER — SODIUM CHLORIDE 0.9 % IJ SOLN
3.0000 mL | INTRAMUSCULAR | Status: DC | PRN
  Administered 2014-04-21: 09:00:00 via INTRAVENOUS

## 2014-04-18 MED ORDER — ALBUTEROL SULFATE (2.5 MG/3ML) 0.083% IN NEBU: 2.5000 mg | INHALATION_SOLUTION | RESPIRATORY_TRACT | Status: DC | PRN

## 2014-04-18 MED ORDER — PREDNISONE 50 MG PO TABS
60.0000 mg | ORAL_TABLET | Freq: Every day | ORAL | Status: DC
  Administered 2014-04-18: 60 mg via ORAL
  Filled 2014-04-18 (×2): qty 1

## 2014-04-18 MED ORDER — CYCLOPHOSPHAMIDE 50 MG PO TABS
100.0000 mg | ORAL_TABLET | Freq: Every day | ORAL | Status: DC
  Administered 2014-04-18 – 2014-04-22 (×5): 100 mg via ORAL
  Filled 2014-04-18 (×10): qty 2

## 2014-04-18 MED ORDER — SODIUM CHLORIDE 0.9 % IJ SOLN
3.0000 mL | Freq: Two times a day (BID) | INTRAMUSCULAR | Status: DC
Start: 2014-04-18 — End: 2014-04-21
  Administered 2014-04-18 – 2014-04-20 (×5): 3 mL via INTRAVENOUS

## 2014-04-18 MED ORDER — METOPROLOL TARTRATE 1 MG/ML IV SOLN
5.0000 mg | Freq: Once | INTRAVENOUS | Status: AC
  Administered 2014-04-18: 5 mg via INTRAVENOUS

## 2014-04-18 MED ORDER — LEVALBUTEROL HCL 0.63 MG/3ML IN NEBU
0.6300 mg | INHALATION_SOLUTION | RESPIRATORY_TRACT | Status: DC | PRN
  Filled 2014-04-18: qty 3

## 2014-04-18 MED ORDER — SODIUM CHLORIDE 0.9 % IV SOLN: 250.0000 mL | INTRAVENOUS | Status: DC | PRN

## 2014-04-18 NOTE — Progress Notes (Signed)
IV metoprolol has decreased heart rate (Aflutter with variable AV block).   Will stop IV diltiazem.   Start IV metoprolol 5mg  IV Q6.   Hold if SBP <85

## 2014-04-18 NOTE — Care Management Note (Signed)
    Page 1 of 1   05/07/2014     10:55:53 AM CARE MANAGEMENT NOTE 05/07/2014  Patient:  Bryan Hubbard, TOLSON R   Account Number:  [id number]  Date Initiated:  04/18/2014  Documentation initiated by:  Elissa Hefty  Subjective/Objective Assessment:   adm w heart failure     Action/Plan:   lives w wife, pcp dr [name]   Anticipated DC Date:  05/08/2014   Anticipated DC Plan:  LONG TERM ACUTE CARE (LTAC)      DC Planning Services  CM consult      Choice offered to / List presented to:             Status of service:  Completed, signed off Medicare Important Message given?  YES (If response is "NO", the following Medicare IM given date fields will be blank) Date Medicare IM given:  05/07/2014 Medicare IM given by:  Elissa Hefty Date Additional Medicare IM given:  04/30/2014 Additional Medicare IM given by:  Jonnie Finner  Discharge Disposition:    Per UR Regulation:  Reviewed for med. necessity/level of care/duration of stay  If discussed at [place] of Stay Meetings, dates discussed:   04/24/2014  04/26/2014  05/08/2014    Comments:  05/04/14 Ellan Lambert, RN, BSN [phone] Noted pt likely to need LTAC upon dc, per NP notes; will consult [facility], per protocol.  Order received for LTAC to C/S  04/30/2014 Additional Medicare IM copy given to patient. Jonnie Finner RN CCM Case Mgmt phone [phone]  04/27/14 1133 henrietta Mayo RN MSN BSN CCM Remains on [place] @ 40L/min.  PCCM now following.  7/22 0756 debbie Tanai Bouler rn,bsn recent adm. will follow for dc planning. may benefit from [facility].

## 2014-04-18 NOTE — Progress Notes (Signed)
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 He presented to the hospital with progressive shortness of breath and lower sternum the edema. In addition he was becoming very dizzy and very weak with standing. He notified his cardiologist of these symptoms several days prior to presentation and was instructed to decrease his metoprolol to 25 mg by mouth twice a day. Unfortunately his symptoms did not improve.  He presented to the hospital where was noted that his systolic blood pressure was in the upper 80s and upon standing dropped into the 70s. Review of patient medication reveals he was taking Lasix 20 mg daily and had continued to take this medication prior to presentation. In addition he endorses having to increase his oxygen at home from 2 L to 4 L because of progressive shortness of breath. He presented to the emergency department on 3 L of oxygen but later dropped his saturations and required increased oxygen to 100% nonrebreather. A two-view chest x-ray was completed in the emergency department that demonstrated acutely worsened diffuse bilateral airspace opacification with a peripheral distribution and diffuse increased interstitial markings that could either be acute infectious or inflammatory interstitial pneumonitis. These changes are superimposed on the patient's chronic underlying interstitial changes. Pulmonary edema was considered to be less likely given the unusual distribution. Interestingly enough patient presented with controlled ventricular response and only had increased heart rate in the ER when hypoxemic. Cardiology was  consulted while the patient was in the ER and the cardiologist recommended a one-time dose of Lasix IV  HPI/Subjective: Primarily complaining of continued shortness of breath and orthopnea. Currently no awareness of tachypalpitations and endorses very rarely is aware that his "heart is racing"  Assessment/Plan:     Goodpasture's disease, treated with steroid, oral cytoxan and plasmapheresis  -Unclear of current respiratory symptoms are a manifestation of exacerbation of Goodpasture's or primarily related to volume overload from intermittent atrial flutter with RVR -Continue supportive care with oxygen (see below) -Consult Pulm to comment -Continue preadmission immunosuppressants     Atrial flutter with rapid ventricular response -Appreciate cardiology assistance -Again unclear if underlying pneumonitis possibly from Goodpasture's exacerbation has subsequently exacerbated the patient's cardiovascular status resulting in RVR or if the respiratory status is primarily from RVR and associated volume overload -Cardiology attempting rate control initially with IV Cardizem and IV beta blockers-after IV Cardizem initiated this morning rate better controlled to cardiology has stopped Cardizem infusion for now -Given underlying lung disease is not a candidate for amiodarone -Patient reports that the possibility of radiofrequency ablation has been discussed if rate control not achievable with medical therapy    Acute on chronic respiratory failure with hypoxia:   A) Acute congestive heart failure/volume   B) Question of exacerbation of pulmonary component of Goodpasture's disease -Continue supportive care with oxygen -Recently treated for community acquired pneumonia the present time does not appear to have acute infectious process requiring antibiotics -Control heart rate in the event that RVR contributing to pulmonary edema-type symptoms and resultant respiratory failure -Patient scheduled to see Dr.  [place] Hubbard with pulmonology as an outpatient and family requesting formal pulmonology evaluation here-Dr. Nelda Hubbard notified    Glomerulonephritis/CKD (chronic kidney disease) stage 3, GFR 30-59 ml/min -  Appears stable at this time with appropriate urinary output    Anemia of chronic disease -Hemoglobin appears stable and actually slightly higher than normal baseline and is between 7.9 and 9      HYPERLIPIDEMIA    HYPERTENSION, BENIGN -Currently blood pressure soft in setting of ongoing RVR so hold any potentially offending medications    History of DVT (deep vein thrombosis) -Was transitioned from warfarin to eliquis during the last hospitalization at recommendation of the pulmonologist in setting of inability to maintain therapeutic INR on warfarin  DVT prophylaxis: Eliquis Code Status: Full Family Communication: Wife and patient at bedside Disposition Plan/Expected LOS: Step down  Consultants: Cardiology Pulmonology  Procedures: None  Cultures: Blood cultures pending Urine culture  Antibiotics: None  Objective: Blood pressure 82/51, pulse 136, temperature 98.3 F (36.8 C), temperature source Oral, resp. rate 18, height 5\' 10"  (1.778 m), weight 173 lb 8 oz (78.7 kg), SpO2 97.00%.  Intake/Output Summary (Last 24 hours) at 04/18/14 1419 Last data filed at 04/18/14 1338  Gross per 24 hour  Intake 497.33 ml  Output   1650 ml  Net -1152.67 ml   Exam: General: Mild acute respiratory distress as evidenced by ongoing orthopnea and dyspnea on room air Lungs: Coarse to auscultation bilaterally with crackles diffusely, 100% nonrebreather Cardiovascular: Irregular rate and rhythm without murmur gallop or rub normal S1 and S2, no peripheral edema  Abdomen: Nontender, nondistended, soft, bowel sounds positive, no rebound, no ascites, no appreciable mass Musculoskeletal: No significant cyanosis, clubbing of bilateral lower extremities   Scheduled Meds:  Scheduled Meds: . apixaban   5 mg Oral BID  . [START ON 05/10/2014] cyanocobalamin  1,000 mcg Intramuscular Q30 days  . cyclobenzaprine  5 mg Oral QHS  . cyclophosphamide  100 mg Oral Q0600  . metoprolol      . metoprolol  5 mg Intravenous 4 times per day  . montelukast  10 mg Oral QHS  . predniSONE  60 mg Oral Q breakfast  . sodium chloride  3 mL Intravenous Q12H   Data Reviewed: Basic Metabolic Panel:  Recent Labs Lab 04/20/2014 2027 04/18/14 0350  NA 130* 132*  K 4.7 4.4  CL 89* 92*  CO2 26 28  GLUCOSE 83 88  BUN 30* 32*  CREATININE 1.44* 1.55*  CALCIUM 8.9 8.7   Liver Function Tests:  Recent Labs Lab 04/16/2014 2027  AST 37  ALT 38  ALKPHOS 61  BILITOT 1.5*  PROT 5.8*  ALBUMIN 2.6*   CBC:  Recent Labs Lab 04/16/2014 2027  WBC 5.2  NEUTROABS 4.9  HGB 10.5*  HCT 31.7*  MCV 97.5  PLT 167   Cardiac Enzymes:  Recent Labs Lab 04/18/14 0350 04/18/14 0945  TROPONINI <0.30 <0.30   BNP (last 3 results)  Recent Labs  03/05/14 0938 03/30/14 2015 04/16/2014 2027  PROBNP 573.0* 7170.0* 6768.0*    Studies:  Recent x-ray studies have been reviewed in detail by the Attending Physician  Time spent :  52mins      Allison Ellis, ANP [facility] Office  [phone] Pager [phone]   **If unable to reach the above provider after paging please contact the [place] @ [phone]  On-Call/Text Page:      [email or web address]      password [email or web address]  If 7PM-7AM, please contact night-coverage [email or web address] Password [email or web address] 04/18/2014, 2:19 PM   LOS: 1 day   I have personally examined this patient and reviewed the entire database. I have reviewed the above note, made any  necessary editorial changes, and agree with its content.  Cherene Altes, MD [facility]

## 2014-04-18 NOTE — Progress Notes (Signed)
Patient's HR sustaining in 140's, aflutter.  Patient asymptomatic other than SOB; says he takes Cardizem at home.  Attending MD and cardiology MD paged.  Orders received for orthostatics.  Will continue to monitor. [name], [name]

## 2014-04-18 NOTE — Progress Notes (Signed)
Cardiology consult note reviewed from earlier this morning.   Atrial flutter, 2-1 conduction. Asymptomatic. Previous EKG does demonstrate atrial flutter with variable conduction with heart rate in the 70s and 80s. He does have the ability to block down his AV node.  He is currently on 10 mg IV diltiazem per hour  I will try 5 mg metoprolol IV x1 to see if this is more effective. If beta blocker is more effective, we will transition over to beta blocker.  I will try to avoid amiodarone given his underlying Goodpasture's/lung disease.  Candee Furbish, MD

## 2014-04-18 NOTE — Consult Note (Signed)
PULMONARY / CRITICAL CARE MEDICINE  Name: Bryan Hubbard MRN: [id number] DOB: [birth year]    ADMISSION DATE:  04/04/2014 CONSULTATION DATE:  03/31/2014  REFERRING MD :  Thereasa Solo  PRIMARY SERVICE:  TRH  CHIEF COMPLAINT:  Hypoxemic respiratory failure  BRIEF PATIENT DESCRIPTION: [name] with PMH of Goodpastures - O2 dependent, presents with worsening SOB, dizziness, weakness and LE swelling.  Cards consulted and did not feel that SOB was due to a primary cardiac reason.  PCCM was consulted for further evaluation.  SIGNIFICANT EVENTS / STUDIES:  7/21 admitted with SOB 2023-04-11  Chest CT >>> Diffuse interstitial lung abnormality, favor NSIP.  LINES / TUBES:  CULTURES: Urine 7/22 >>> Blood 7/22 >>>  ANTIBIOTICS:   HISTORY OF PRESENT ILLNESS:  73 yo M with PMH as outlined below which includes Goodpasture's syndrome on chronic immunosuppression, presented to ED on 7/21 with progressive SOB, dizziness, weakness, and LE edema.  He was recently hospitalized for similar reason but symptoms have worsened since he was discharged home.  Cardiology was consulted for concerns of HF (elevated BNP, LE edema); however, due to significant hypoxia and CXR findings, they did not feel that this presentation was primarily secondary to a cardiac process.  They recommended pulmonary consult for further evaluation. He denies any fevers/chills/sweats, headaches, chest pain, wheezing, hemoptysis, abdominal pain, N/V/D, hematuria, myalgias, exposures to known sick contacts.  Only endorses SOB, dizziness, weakness, and LE edema.  PAST MEDICAL HISTORY :  Past Medical History  Diagnosis Date  . Atrial flutter started in 2011  . Chronic diastolic heart failure   . DVT   . HYPERLIPIDEMIA   . HYPERTENSION, BENIGN   . ALLERGIC ASTHMA   . Palpitations   . H/O Goodpasture's syndrome     "autoimmune disease; affects kidneys and lungs"  . DVT (deep venous thrombosis) 09/2001    "right groin"  . PE (pulmonary embolism)  09/2001  . CHF (congestive heart failure)   . Anemia   . History of blood transfusion     "related to Goodpasture's syndrome; plasmapheresis process"  . Stomach ulcer   . Arthritis     "hands, knees" (03/16/2014)  . Gout   . Renal insufficiency     "related to the Goodpasture's"  . Atrial flutter with rapid ventricular response 03/16/2014  . Acute on chronic diastolic HF (heart failure) in combination with rapid a flutter and anemia 03/18/2014  . Ejection fraction    Past Surgical History  Procedure Laterality Date  . Renal biopsy, percutaneous  12/2013  . Vena cava filter placement  09/2001   Prior to Admission medications   Medication Sig Start Date End Date Taking? Authorizing Provider  bimatoprost (LUMIGAN) 0.01 % SOLN Place 1 drop into both eyes every morning.   Yes Historical Provider, MD  cyanocobalamin (,VITAMIN B-12,) 1000 MCG/ML injection Inject 1,000 mcg into the muscle every 30 (thirty) days.    Yes Historical Provider, MD  cyclobenzaprine (FLEXERIL) 5 MG tablet Take 5 mg by mouth at bedtime.   Yes Historical Provider, MD  cyclophosphamide (CYTOXAN) 50 MG tablet Take 100 mg by mouth daily. Give on an empty stomach 1 hour before or 2 hours after meals.   Yes Historical Provider, MD  Dutasteride-Tamsulosin HCl 0.5-0.4 MG CAPS Take 1 capsule by mouth daily.   Yes Historical Provider, MD  furosemide (LASIX) 20 MG tablet Take 1 tablet (20 mg total) by mouth daily. 12/30/13  Yes Belkys A Regalado, MD  metoprolol (LOPRESSOR) 50 MG tablet Take  50 mg by mouth 2 (two) times daily. 03/18/14  Yes Cecilie Kicks, NP  montelukast (SINGULAIR) 10 MG tablet Take 10 mg by mouth at bedtime.  06/10/13  Yes Historical Provider, MD  omeprazole (PRILOSEC) 20 MG capsule Take 20 mg by mouth daily.  08/17/13  Yes Historical Provider, MD  predniSONE (DELTASONE) 10 MG tablet Take 20 mg by mouth daily with breakfast.    Yes Historical Provider, MD  traMADol (ULTRAM) 50 MG tablet Take 1 tablet (50 mg total) by  mouth every 12 (twelve) hours as needed. 12/30/13  Yes Belkys A Regalado, MD  Vitamin D, Cholecalciferol, 1000 UNITS TABS Take 2,000 Units by mouth daily.    Yes Historical Provider, MD  warfarin (COUMADIN) 1 MG tablet Take 0.5 mg by mouth daily.   Yes Historical Provider, MD  warfarin (COUMADIN) 4 MG tablet Take 4 mg by mouth daily.   Yes Historical Provider, MD   No Known Allergies  FAMILY HISTORY:  Family History  Problem Relation Age of Onset  . Stroke Mother   . Heart attack Maternal Grandmother   . Coronary artery disease      negative history of early CAD   SOCIAL HISTORY:  reports that he quit smoking about 46 years ago. His smoking use included Cigarettes. He has a 6 pack-year smoking history. He has never used smokeless tobacco. He reports that he drinks about 8.4 ounces of alcohol per week. He reports that he does not use illicit drugs.  REVIEW OF SYSTEMS:  All negative; except for those that are bolded, which indicate positives.  Constitutional: weight loss, weight gain, night sweats, fevers, chills, fatigue, weakness.  HEENT: headaches, sore throat, sneezing, nasal congestion, post nasal drip, difficulty swallowing, tooth/dental problems, visual complaints, visual changes, ear aches. Neuro: difficulty with speech, weakness, numbness, ataxia. CV:  chest pain, orthopnea, PND, swelling in lower extremities, dizziness, palpitations, syncope.  Resp: cough, hemoptysis, dyspnea, wheezing. GI  heartburn, indigestion, abdominal pain, nausea, vomiting, diarrhea, constipation, change in bowel habits, loss of appetite, hematemesis, melena, hematochezia.  GU: dysuria, change in color of urine, urgency or frequency, flank pain, hematuria. MSK: joint pain or swelling, decreased range of motion. Psych: change in mood or affect, depression, anxiety, suicidal ideations, homicidal ideations. Skin: rash, itching, bruising.   INTERVAL HISTORY:  VITAL SIGNS: Temp:  [98 F (36.7 C)-99.4 F  (37.4 C)] 98.3 F (36.8 C) (07/22 1229) Pulse Rate:  [40-142] 136 (07/22 1300) Resp:  [16-35] 18 (07/22 1300) BP: (82-115)/(50-77) 82/51 mmHg (07/22 1300) SpO2:  [76 %-100 %] 97 % (07/22 1300) FiO2 (%):  [45 %-100 %] 50 % (07/22 1220) Weight:  [78.7 kg (173 lb 8 oz)-79.379 kg (175 lb)] 78.7 kg (173 lb 8 oz) (07/22 0145)  HEMODYNAMICS:   VENTILATOR SETTINGS: Vent Mode:  [-]  FiO2 (%):  [45 %-100 %] 50 % INTAKE / OUTPUT: Intake/Output     07/21 0701 - 07/22 0700 07/22 0701 - 07/23 0700   P.O.  480   I.V. (mL/kg)  17.3 (0.2)   Total Intake(mL/kg)  497.3 (6.3)   Urine (mL/kg/hr) 950 700 (1.2)   Total Output 950 700   Net -950 -202.7         PHYSICAL EXAMINATION: General: Pleasant male, resting in bed, in NAD. Neuro: A&O x 3, non-focal.  HEENT: Frost/AT. PERRL, sclerae anicteric. Cardiovascular: RRR, no M/R/G.  Lungs: Respirations even and unlabored on NRB mask..  Scattered rhonchi, mainly in LUL. Abdomen: BS x 4, soft, NT/ND.  Musculoskeletal: No  gross deformities, 1+ edema. Skin: Intact, warm, no rashes.    LABS: CBC  Recent Labs Lab 04/07/2014 2027  WBC 5.2  HGB 10.5*  HCT 31.7*  PLT 167   Coag's  Recent Labs Lab 04/26/2014 2027  INR 1.36   BMET  Recent Labs Lab 04/09/2014 2027 04/18/14 0350  NA 130* 132*  K 4.7 4.4  CL 89* 92*  CO2 26 28  BUN 30* 32*  CREATININE 1.44* 1.55*  GLUCOSE 83 88   Electrolytes  Recent Labs Lab 04/03/2014 2027 04/18/14 0350  CALCIUM 8.9 8.7   Sepsis Markers No results found for this basename: LATICACIDVEN, PROCALCITON, O2SATVEN,  in the last 168 hours ABG  Recent Labs Lab 04/24/2014 2206  PHART 7.529*  PCO2ART 29.7*  PO2ART 40.0*    Liver Enzymes  Recent Labs Lab 04/18/2014 2027  AST 37  ALT 38  ALKPHOS 61  BILITOT 1.5*  ALBUMIN 2.6*   Cardiac Enzymes  Recent Labs Lab 04/23/2014 2027 04/18/14 0350 04/18/14 0945  TROPONINI  --  <0.30 <0.30  PROBNP 6768.0*  --   --    Glucose No results found for  this basename: GLUCAP,  in the last 168 hours  IMAGING: Dg Chest 2 View  04/03/2014   CLINICAL DATA:  Dizziness, shortness of breath and congestion.  EXAM: CHEST  2 VIEW  COMPARISON:  Chest radiograph and CT of the chest performed 03/30/2014  FINDINGS: The lungs are well-aerated. Acutely worsened diffuse bilateral airspace opacification is noted, with a peripheral distribution and diffusely increased interstitial markings. This may reflect an acute infectious or inflammatory interstitial pneumonitis. This is superimposed on the patient's underlying chronic interstitial changes. Pulmonary edema is considered less likely, given the unusual distribution. There is no evidence of pleural effusion or pneumothorax.  The heart is borderline enlarged. No acute osseous abnormalities are seen.  IMPRESSION: 1. Acutely worsened diffuse bilateral airspace opacification, with a peripheral distribution and diffusely increased interstitial markings. This may reflect an acute infectious or inflammatory interstitial pneumonitis, and is superimposed on the patient's underlying chronic interstitial changes. Pulmonary edema is considered less likely, given the unusual distribution. 2. Borderline cardiomegaly.   Electronically Signed   By: Garald Balding M.D.   On: 04/19/2014 21:43   ASSESSMENT / PLAN:  Acute on chronic hypoxemic respiratory failure Goodpastures Syndrome Immunosuppressed / on chronic Prednisone (Goodpastures) Low suspicion for PNA - no fever/chills/sweats, no cough, and has had SOB for several weeks now. History of PE / DVT s/p SVC filter and on chronic anticoagulation A.flutter with RVR RECS: - Supplemental oxygen for SpO2 > 92. - Consider pulse steroids x 3 days, will discuss with attending. - Continue cyclophosphamide. - Continue Singulair. - Add Albuterol PRN. - Diuresis as BP / renal function permits. - Continue Eliquis. - Continue Lopressor for rate control.   Montey Hora, PA - [facility] [place]  Pulmonary & Critical Care Medicine Pgr: [phone]  or [phone]  D/C prednisone, three days pulse steroids then return to prednisone 60 mg PO daily after that.  Continue cyclophosphamide.  Titrate O2 down.  Continue diureses and diureses as pressure and renal function allows.  Patient seen and examined, agree with above note.  I dictated the care and orders written for this patient under my direction.  Rush Farmer, MD [phone]

## 2014-04-18 NOTE — Consult Note (Signed)
Reason for Consult: shortness of breath Referring Physician: Dr. Durwin Bryan Hubbard is an 73 y.o. male.  HPI: 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 He tells me he has not had frank syncope: he gets dizziness going from sitting to standing but what he has is weakness in his legs that has caused him to fall today. He may have had a tactile fever, some chills. No overt infectious symptoms. No frank LOC/syncope. He has poor appetite but eating/drinking some. No change in weight at home. His ER chest x-ray is markedly different from previous as well as elevated BNP. Cardiology consulted given SOB and concern for HF.    Past Medical History  Diagnosis Date  . Atrial flutter started in 2011  . Chronic diastolic heart failure   . DVT   . HYPERLIPIDEMIA   . HYPERTENSION, BENIGN   . ALLERGIC ASTHMA   . Palpitations   . H/O Goodpasture's syndrome     "autoimmune disease; affects kidneys and lungs"  . DVT (deep venous thrombosis) 09/2001    "right groin"  . PE (pulmonary embolism) 09/2001  . CHF (congestive heart failure)   . Anemia   . History of blood transfusion     "related to Goodpasture's syndrome; plasmapheresis process"  . Stomach ulcer   . Arthritis     "hands, knees" (03/16/2014)  . Gout   . Renal insufficiency     "related to the Goodpasture's"  . Atrial flutter with rapid ventricular response 03/16/2014  . Acute on chronic diastolic HF (heart failure) in combination with rapid a flutter and anemia 03/18/2014  . Ejection fraction     Past Surgical History  Procedure Laterality Date  . Renal biopsy, percutaneous  12/2013  . Vena cava filter placement  09/2001    Family History  Problem Relation Age of Onset   . Stroke Mother   . Heart attack Maternal Grandmother   . Coronary artery disease      negative history of early CAD    Social History:  reports that he quit smoking about 46 years ago. His smoking use included Cigarettes. He has a 6 pack-year smoking history. He has never used smokeless tobacco. He reports that he drinks about 8.4 ounces of alcohol per week. He reports that he does not use illicit drugs.  Allergies: No Known Allergies  Medications:  I have reviewed the patient's current medications. Prior to Admission:  (Not in a hospital admission) Scheduled: . furosemide  40 mg Intravenous Once   Continuous:   Results for orders placed during the hospital encounter of 04/21/2014 (from the past 48 hour(s))  CBC WITH DIFFERENTIAL     Status: Abnormal   Collection Time    04/15/2014  8:27 PM      Result Value Ref Range   WBC 5.2  4.0 - 10.5 K/uL   RBC 3.25 (*) 4.22 - 5.81 MIL/uL   Hemoglobin 10.5 (*) 13.0 - 17.0 g/dL   HCT 31.7 (*) 39.0 - 52.0 %   MCV 97.5  78.0 - 100.0 fL   MCH 32.3  26.0 - 34.0 pg   MCHC 33.1  30.0 - 36.0 g/dL   RDW 21.7 (*) 11.5 - 15.5 %   Platelets 167  150 - 400  K/uL   Neutrophils Relative % 95 (*) 43 - 77 %   Lymphocytes Relative 2 (*) 12 - 46 %   Monocytes Relative 2 (*) 3 - 12 %   Eosinophils Relative 1  0 - 5 %   Basophils Relative 0  0 - 1 %   Neutro Abs 4.9  1.7 - 7.7 K/uL   Lymphs Abs 0.1 (*) 0.7 - 4.0 K/uL   Monocytes Absolute 0.1  0.1 - 1.0 K/uL   Eosinophils Absolute 0.1  0.0 - 0.7 K/uL   Basophils Absolute 0.0  0.0 - 0.1 K/uL   WBC Morphology SLIGHT TOXIC GRANULATION NOTED    COMPREHENSIVE METABOLIC PANEL     Status: Abnormal   Collection Time    04/23/2014  8:27 PM      Result Value Ref Range   Sodium 130 (*) 137 - 147 mEq/L   Potassium 4.7  3.7 - 5.3 mEq/L   Chloride 89 (*) 96 - 112 mEq/L   CO2 26  19 - 32 mEq/L   Glucose, Bld 83  70 - 99 mg/dL   BUN 30 (*) 6 - 23 mg/dL   Creatinine, Ser 1.44 (*) 0.50 - 1.35 mg/dL   Calcium 8.9   8.4 - 10.5 mg/dL   Total Protein 5.8 (*) 6.0 - 8.3 g/dL   Albumin 2.6 (*) 3.5 - 5.2 g/dL   AST 37  0 - 37 U/L   ALT 38  0 - 53 U/L   Alkaline Phosphatase 61  39 - 117 U/L   Total Bilirubin 1.5 (*) 0.3 - 1.2 mg/dL   GFR calc non Af Amer 46 (*) >90 mL/min   GFR calc Af Amer 54 (*) >90 mL/min   Comment: (NOTE)     The eGFR has been calculated using the CKD EPI equation.     This calculation has not been validated in all clinical situations.     eGFR's persistently <90 mL/min signify possible Chronic Kidney     Disease.   Anion gap 15  5 - 15  PRO B NATRIURETIC PEPTIDE     Status: Abnormal   Collection Time    04/14/2014  8:27 PM      Result Value Ref Range   Pro B Natriuretic peptide (BNP) 6768.0 (*) 0 - 125 pg/mL  PROTIME-INR     Status: Abnormal   Collection Time    04/22/2014  8:27 PM      Result Value Ref Range   Prothrombin Time 16.8 (*) 11.6 - 15.2 seconds   INR 1.36  0.00 - 1.49  I-STAT TROPOININ, ED     Status: Abnormal   Collection Time    03/28/2014  8:36 PM      Result Value Ref Range   Troponin i, poc 0.11 (*) 0.00 - 0.08 ng/mL   Comment NOTIFIED PHYSICIAN     Comment 3            Comment: Due to the release kinetics of cTnI,     a negative result within the first hours     of the onset of symptoms does not rule out     myocardial infarction with certainty.     If myocardial infarction is still suspected,     repeat the test at appropriate intervals.  I-STAT ARTERIAL BLOOD GAS, ED     Status: Abnormal   Collection Time    03/31/2014 10:06 PM      Result Value Ref Range   pH,  Arterial 7.529 (*) 7.350 - 7.450   pCO2 arterial 29.7 (*) 35.0 - 45.0 mmHg   pO2, Arterial 40.0 (*) 80.0 - 100.0 mmHg   Bicarbonate 24.7 (*) 20.0 - 24.0 mEq/L   TCO2 26  0 - 100 mmol/L   O2 Saturation 81.0     Acid-Base Excess 3.0 (*) 0.0 - 2.0 mmol/L   Patient temperature 99.2 F     Sample type ARTERIAL     Comment NOTIFIED PHYSICIAN      Dg Chest 2 View  04/08/2014   CLINICAL DATA:   Dizziness, shortness of breath and congestion.  EXAM: CHEST  2 VIEW  COMPARISON:  Chest radiograph and CT of the chest performed 03/30/2014  FINDINGS: The lungs are well-aerated. Acutely worsened diffuse bilateral airspace opacification is noted, with a peripheral distribution and diffusely increased interstitial markings. This may reflect an acute infectious or inflammatory interstitial pneumonitis. This is superimposed on the patient's underlying chronic interstitial changes. Pulmonary edema is considered less likely, given the unusual distribution. There is no evidence of pleural effusion or pneumothorax.  The heart is borderline enlarged. No acute osseous abnormalities are seen.  IMPRESSION: 1. Acutely worsened diffuse bilateral airspace opacification, with a peripheral distribution and diffusely increased interstitial markings. This may reflect an acute infectious or inflammatory interstitial pneumonitis, and is superimposed on the patient's underlying chronic interstitial changes. Pulmonary edema is considered less likely, given the unusual distribution. 2. Borderline cardiomegaly.   Electronically Signed   By: Garald Balding M.D.   On: 04/04/2014 21:43    Review of Systems  Constitutional: Positive for chills, weight loss and malaise/fatigue. Negative for diaphoresis.  HENT: Negative for ear pain.   Eyes: Negative for double vision and photophobia.  Respiratory: Positive for shortness of breath.   Cardiovascular: Positive for leg swelling. Negative for palpitations.  Gastrointestinal: Negative for vomiting, abdominal pain and diarrhea.  Genitourinary: Negative for dysuria and hematuria.  Musculoskeletal: Negative for myalgias and neck pain.  Skin: Negative for rash.  Neurological: Positive for dizziness. Negative for tingling, tremors, loss of consciousness and headaches.  Endo/Heme/Allergies: Negative for polydipsia.  Psychiatric/Behavioral: Negative for suicidal ideas, hallucinations and  substance abuse.   Blood pressure 107/50, pulse 68, temperature 99.2 F (37.3 C), temperature source Oral, resp. rate 24, height $RemoveBe'5\' 10"'PEyXOWjes$  (1.778 m), weight 79.379 kg (175 lb), SpO2 99.00%. Physical Exam  Nursing note and vitals reviewed. Constitutional: He is oriented to person, place, and time. He appears distressed.  Wearing NRB, appears stated age, mildly uncomfortable  HENT:  Head: Normocephalic and atraumatic.  Nose: Nose normal.  Mouth/Throat: Oropharynx is clear and moist. No oropharyngeal exudate.  Eyes: Conjunctivae and EOM are normal. Pupils are equal, round, and reactive to light. No scleral icterus.  Neck: Normal range of motion. Neck supple. JVD present. No tracheal deviation present.  Cardiovascular: Normal rate, regular rhythm, normal heart sounds and intact distal pulses.  Exam reveals no gallop.   No murmur heard. Respiratory: He is in respiratory distress. He has wheezes. He has rales.  GI: Soft. Bowel sounds are normal. He exhibits no distension. There is no tenderness. There is no rebound.  Musculoskeletal: Normal range of motion. He exhibits edema. He exhibits no tenderness.  Neurological: He is oriented to person, place, and time. No cranial nerve deficit.  Skin: Skin is warm and dry. No rash noted. He is not diaphoretic. No erythema.  Psychiatric: He has a normal mood and affect. His behavior is normal. Thought content normal.  labs reviewed; K  4.7, Cr 1.44, ast/alt 37/38 Trop I 0.11, BNP 6768 Chest x-ray: diffuse bilateral airspace disease significantly change from previous  Assessment/Plan: Mr. Ohlrich is a 73 yo man with PMH of atrial flutter, prior PE with IVC filter, Goodpasture's disease of kidneys/lungs, CKD stage III+, home O2 2-4L, orthostatic hypotension recently admitted for PNA, shortness of breath, atrial flutter and started on oxygen. He presents today with weakness, orthostatic hypotension, falling and found to have profound changes in chest pain.  Differential includes pulmonary process - Goodpasture's, new infection, other inflammatory pulmonary process among others. I do not feel heart failure presents with such hypoxia and chest x-ray findings. He does have higher circulating volume with moderate JVP perhaps related to prednisone intake but I do not feel this is his primary issue. He also has orthostatic hypotension and/or POTS that may be related to age, vascular disease and certainly deconditioning in the hospital.  1. Shortness of breath: I favor pulmonary process. Please consult pulmonary. Goodpasture's vs. Infectious. Would culture urine, blood, sputum. Consider broad spectrum antibiotic coverage initially.  2. Acute on chronic heart failure - preserved EF: There may be a component of HF with elevated JVP and BNP. I think trial of 40 mg IV lasix and if tolerates, then probably continue for 2-3 doses in total.  3. Atrial flutter: appears rate controlled. Has IVC filter. No anticoagulation given potential procedures significant anemia on procrit. Is on apixaban as outpatient - took dose this AM 7/22.  4. CKD stage III+: avoid nephrotoxic agents 5. Goodpasture's disease: I feel this may be contributing to chest x-ray findings/symptoms: Please consult pulmonary and renal (primary treating physician) and continue home prednisone/cytoxan for now.  6. Hypertension: hold given borderline bp 7. Dyslipidemia: continue statin    Christi Wirick 04/18/2014, 12:42 AM

## 2014-04-19 DIAGNOSIS — I5021 Acute systolic (congestive) heart failure: Secondary | ICD-10-CM

## 2014-04-19 DIAGNOSIS — N059 Unspecified nephritic syndrome with unspecified morphologic changes: Secondary | ICD-10-CM

## 2014-04-19 DIAGNOSIS — I4892 Unspecified atrial flutter: Secondary | ICD-10-CM

## 2014-04-19 DIAGNOSIS — I82409 Acute embolism and thrombosis of unspecified deep veins of unspecified lower extremity: Secondary | ICD-10-CM

## 2014-04-19 DIAGNOSIS — J8410 Pulmonary fibrosis, unspecified: Secondary | ICD-10-CM

## 2014-04-19 DIAGNOSIS — I2789 Other specified pulmonary heart diseases: Secondary | ICD-10-CM

## 2014-04-19 LAB — BASIC METABOLIC PANEL
Anion gap: 15 (ref 5–15)
BUN: 37 mg/dL — ABNORMAL HIGH (ref 6–23)
CALCIUM: 9 mg/dL (ref 8.4–10.5)
CO2: 24 meq/L (ref 19–32)
CREATININE: 1.33 mg/dL (ref 0.50–1.35)
Chloride: 95 mEq/L — ABNORMAL LOW (ref 96–112)
GFR calc Af Amer: 59 mL/min — ABNORMAL LOW (ref >90)
GFR calc non Af Amer: 51 mL/min — ABNORMAL LOW (ref >90)
GLUCOSE: 159 mg/dL — AB (ref 70–99)
Potassium: 3.8 mEq/L (ref 3.7–5.3)
SODIUM: 134 meq/L — AB (ref 137–147)

## 2014-04-19 LAB — CBC
HCT: 27.2 % — ABNORMAL LOW (ref 39.0–52.0)
HEMOGLOBIN: 9.3 g/dL — AB (ref 13.0–17.0)
MCH: 32.5 pg (ref 26.0–34.0)
MCHC: 34.2 g/dL (ref 30.0–36.0)
MCV: 95.1 fL (ref 78.0–100.0)
Platelets: 127 10*3/uL — ABNORMAL LOW (ref 150–400)
RBC: 2.86 MIL/uL — AB (ref 4.22–5.81)
RDW: 20.8 % — ABNORMAL HIGH (ref 11.5–15.5)
WBC: 5.1 10*3/uL (ref 4.0–10.5)

## 2014-04-19 MED ORDER — METOPROLOL TARTRATE 25 MG PO TABS
25.0000 mg | ORAL_TABLET | Freq: Four times a day (QID) | ORAL | Status: DC
  Administered 2014-04-19 – 2014-05-07 (×55): 25 mg via ORAL
  Filled 2014-04-19 (×80): qty 1

## 2014-04-19 MED ORDER — LEVALBUTEROL HCL 1.25 MG/0.5ML IN NEBU
1.2500 mg | INHALATION_SOLUTION | Freq: Four times a day (QID) | RESPIRATORY_TRACT | Status: DC
  Administered 2014-04-19 – 2014-05-02 (×53): 1.25 mg via RESPIRATORY_TRACT
  Filled 2014-04-19 (×58): qty 0.5

## 2014-04-19 MED ORDER — DEXTROSE 5 % IV SOLN
5.0000 mg/h | INTRAVENOUS | Status: DC
  Administered 2014-04-19: 5 mg/h via INTRAVENOUS
  Filled 2014-04-19: qty 100

## 2014-04-19 MED ORDER — METOPROLOL TARTRATE 1 MG/ML IV SOLN
5.0000 mg | Freq: Once | INTRAVENOUS | Status: AC
  Administered 2014-04-19: 5 mg via INTRAVENOUS
  Filled 2014-04-19: qty 5

## 2014-04-19 MED ORDER — METOPROLOL TARTRATE 1 MG/ML IV SOLN
5.0000 mg | INTRAVENOUS | Status: DC | PRN
  Administered 2014-04-19 – 2014-04-20 (×5): 5 mg via INTRAVENOUS
  Filled 2014-04-19: qty 5

## 2014-04-19 MED ORDER — LATANOPROST 0.005 % OP SOLN
1.0000 [drp] | Freq: Every morning | OPHTHALMIC | Status: DC
  Administered 2014-04-20 – 2014-05-07 (×18): 1 [drp] via OPHTHALMIC
  Filled 2014-04-19: qty 2.5

## 2014-04-19 NOTE — Consult Note (Signed)
PULMONARY / CRITICAL CARE MEDICINE  Name: Bryan Hubbard MRN: [id number] DOB: [birth year]    ADMISSION DATE:  04/16/2014 CONSULTATION DATE:  03/31/2014  REFERRING MD :  Thereasa Solo  PRIMARY SERVICE:  TRH  CHIEF COMPLAINT:  Hypoxemic respiratory failure  BRIEF PATIENT DESCRIPTION: [name] with PMH of Goodpastures - O2 dependent, presents with worsening SOB, dizziness, weakness and LE swelling.  Cards consulted and did not feel that SOB was due to a primary cardiac reason.  PCCM was consulted for further evaluation.  SIGNIFICANT EVENTS / STUDIES:  3/27 TTE >>> PAP 45 torr 7/3   Chest CT >>> bilateral interstitial lung abnormality without nodules or consolidation, no effusion  7/7   VQ scan >>> intermediate probability of PE, new acute perfusion defect LUL since prior VQ in 12/2013  7/7   PFT >>> restrictive pattern, severe diffusion impairment, significant bronchodilator responce  7/8   Venous duplex >>> no clot   LINES / TUBES:  CULTURES: Urine 7/22 >>> Blood 7/22 >>>  ANTIBIOTICS:  INTERVAL HISTORY:  Remains dyspneic, with rapid heart rate.  VITAL SIGNS: Temp:  [97.4 F (36.3 C)-98.5 F (36.9 C)] 97.5 F (36.4 C) (07/23 0800) Pulse Rate:  [25-141] 131 (07/23 0700) Resp:  [15-31] 23 (07/23 0800) BP: (82-119)/(15-83) 100/75 mmHg (07/23 0800) SpO2:  [83 %-100 %] 90 % (07/23 0800) FiO2 (%):  [45 %-50 %] 50 % (07/22 1220) Weight:  [76.6 kg (168 lb 14 oz)] 76.6 kg (168 lb 14 oz) (07/23 0400)  HEMODYNAMICS:   VENTILATOR SETTINGS: Vent Mode:  [-]  FiO2 (%):  [45 %-50 %] 50 %  INTAKE / OUTPUT: Intake/Output     07/22 0701 - 07/23 0700 07/23 0701 - 07/24 0700   P.O. 720    I.V. (mL/kg) 310.9 (4.1)    Total Intake(mL/kg) 1030.9 (13.5)    Urine (mL/kg/hr) 1600 (0.9)    Total Output 1600     Net -569.1          Stool Occurrence 1 x     PHYSICAL EXAMINATION: General: Work of breathing is somewhat inceased Neuro: Awake, alert, interactive HEENT: PERRL Cardiovascular:  Irregular, tachycardic Lungs: Bilateral air entry, rales Abdomen: Soft, bowl sounds present Musculoskeletal: Trace edema Skin: Intact, warm, no rashes.  LABS: CBC  Recent Labs Lab 04/12/2014 2027 04/19/14 0215  WBC 5.2 5.1  HGB 10.5* 9.3*  HCT 31.7* 27.2*  PLT 167 127*   Coag's  Recent Labs Lab 04/26/2014 2027  INR 1.36   BMET  Recent Labs Lab 04/13/2014 2027 04/18/14 0350 04/19/14 0215  NA 130* 132* 134*  K 4.7 4.4 3.8  CL 89* 92* 95*  CO2 26 28 24   BUN 30* 32* 37*  CREATININE 1.44* 1.55* 1.33  GLUCOSE 83 88 159*   Electrolytes  Recent Labs Lab 04/24/2014 2027 04/18/14 0350 04/19/14 0215  CALCIUM 8.9 8.7 9.0   Sepsis Markers No results found for this basename: LATICACIDVEN, PROCALCITON, O2SATVEN,  in the last 168 hours  ABG  Recent Labs Lab 04/12/2014 2206  PHART 7.529*  PCO2ART 29.7*  PO2ART 40.0*   Liver Enzymes  Recent Labs Lab 04/11/2014 2027  AST 37  ALT 38  ALKPHOS 61  BILITOT 1.5*  ALBUMIN 2.6*   Cardiac Enzymes  Recent Labs Lab 04/12/2014 2027 04/18/14 0350 04/18/14 0945 04/18/14 1500  TROPONINI  --  <0.30 <0.30 <0.30  PROBNP 6768.0*  --   --   --    Glucose No results found for this basename: GLUCAP,  in the last 168 hours  IMAGING: Dg Chest 2 View  04/16/2014   CLINICAL DATA:  Dizziness, shortness of breath and congestion.  EXAM: CHEST  2 VIEW  COMPARISON:  Chest radiograph and CT of the chest performed 03/30/2014  FINDINGS: The lungs are well-aerated. Acutely worsened diffuse bilateral airspace opacification is noted, with a peripheral distribution and diffusely increased interstitial markings. This may reflect an acute infectious or inflammatory interstitial pneumonitis. This is superimposed on the patient's underlying chronic interstitial changes. Pulmonary edema is considered less likely, given the unusual distribution. There is no evidence of pleural effusion or pneumothorax.  The heart is borderline enlarged. No acute osseous  abnormalities are seen.  IMPRESSION: 1. Acutely worsened diffuse bilateral airspace opacification, with a peripheral distribution and diffusely increased interstitial markings. This may reflect an acute infectious or inflammatory interstitial pneumonitis, and is superimposed on the patient's underlying chronic interstitial changes. Pulmonary edema is considered less likely, given the unusual distribution. 2. Borderline cardiomegaly.   Electronically Signed   By: Garald Balding M.D.   On: 03/29/2014 21:43   ASSESSMENT / PLAN:  Acute on chronic hypoxemic respiratory failure, multifactorial.   Goal SpO2>92  Supplemental oxygen  Reactive airway disease with significant bronchodilator response. FEV1/FVC likely falsely elevated due to coexisting restrictive disease.   Xopenex, change to scheduled and PRN  ILD Goodpasture's syndrome   Pulse dose Solu-Medrol x 3 days  Cyclophosphamide  Not a candidate for Bx at this time  PE   Eliquis  S/p IVC filter  Atrial flutter with rapid ventricular response   Per Cardiology  Metoprolol  Would avoid Amiodarone  Chronic diastolic heart failure, no evidence of exacerbation   Defer diuresis  Pulmonary hypertension  Immunosuppression  Doubt pneumonia  I have personally obtained history, examined patient, evaluated and interpreted laboratory and imaging results, reviewed medical records, formulated assessment / plan and placed orders.  Doree Fudge, MD Pulmonary and [place] Pager: [phone]  04/19/2014, 9:58 AM

## 2014-04-19 NOTE — Progress Notes (Signed)
Subjective:  Still having trouble oxygenating.  Objective:  Vital Signs in the last 24 hours: Temp:  [97.4 F (36.3 C)-98.5 F (36.9 C)] 98 F (36.7 C) (07/23 1200) Pulse Rate:  [25-138] 136 (07/23 1300) Resp:  [15-30] 30 (07/23 1300) BP: (82-119)/(15-83) 92/75 mmHg (07/23 1300) SpO2:  [87 %-96 %] 87 % (07/23 1300) Weight:  [168 lb 14 oz (76.6 kg)] 168 lb 14 oz (76.6 kg) (07/23 0400)  Intake/Output from previous day: 07/22 0701 - 07/23 0700 In: 1270.9 [P.O.:960; I.V.:310.9] Out: 1600 [Urine:1600]   Physical Exam: General: Well developed, well nourished, in no acute distress. Head:  Normocephalic and atraumatic. Lungs: Coarse bilaterally. Heart: Tachycardic, regular.  No murmur, rubs or gallops.  Abdomen: soft, non-tender, positive bowel sounds. Extremities: No clubbing or cyanosis. No edema. Neurologic: Alert and oriented x 3.    Lab Results:  Recent Labs  04/25/2014 2027 04/19/14 0215  WBC 5.2 5.1  HGB 10.5* 9.3*  PLT 167 127*    Recent Labs  04/18/14 0350 04/19/14 0215  NA 132* 134*  K 4.4 3.8  CL 92* 95*  CO2 28 24  GLUCOSE 88 159*  BUN 32* 37*  CREATININE 1.55* 1.33    Recent Labs  04/18/14 0945 04/18/14 1500  TROPONINI <0.30 <0.30   Hepatic Function Panel  Recent Labs  04/23/2014 2027  PROT 5.8*  ALBUMIN 2.6*  AST 37  ALT 38  ALKPHOS 61  BILITOT 1.5*   Imaging: Dg Chest 2 View  04/16/2014   CLINICAL DATA:  Dizziness, shortness of breath and congestion.  EXAM: CHEST  2 VIEW  COMPARISON:  Chest radiograph and CT of the chest performed 03/30/2014  FINDINGS: The lungs are well-aerated. Acutely worsened diffuse bilateral airspace opacification is noted, with a peripheral distribution and diffusely increased interstitial markings. This may reflect an acute infectious or inflammatory interstitial pneumonitis. This is superimposed on the patient's underlying chronic interstitial changes. Pulmonary edema is considered less likely, given the  unusual distribution. There is no evidence of pleural effusion or pneumothorax.  The heart is borderline enlarged. No acute osseous abnormalities are seen.  IMPRESSION: 1. Acutely worsened diffuse bilateral airspace opacification, with a peripheral distribution and diffusely increased interstitial markings. This may reflect an acute infectious or inflammatory interstitial pneumonitis, and is superimposed on the patient's underlying chronic interstitial changes. Pulmonary edema is considered less likely, given the unusual distribution. 2. Borderline cardiomegaly.   Electronically Signed   By: Garald Balding M.D.   On: 04/14/2014 21:43   Personally viewed.   Telemetry: Atrial flutter. At 10 AM this morning after being given 5 mg of IV metoprolol, he did decrease his heart rate. Personally viewed.   EKG:  Atrial flutter  Cardiac Studies:  EF 50-55% mild pulmonary hypertension, 35 mmHg  . apixaban  5 mg Oral BID  . [START ON 05/10/2014] cyanocobalamin  1,000 mcg Intramuscular Q30 days  . cyclobenzaprine  5 mg Oral QHS  . cyclophosphamide  100 mg Oral Q0600  . levalbuterol  1.25 mg Nebulization 4 times per day  . methylPREDNISolone (SOLU-MEDROL) injection  125 mg Intravenous Q6H  . metoprolol  5 mg Intravenous 4 times per day  . metoprolol tartrate  25 mg Oral 4 times per day  . montelukast  10 mg Oral QHS  . sodium chloride  3 mL Intravenous Q12H   Assessment/Plan:   73 year old male with Goodpasture syndrome, atrial flutter, hypoxemic respiratory failure, chronic kidney disease stage III.  1. Atrial flutter-diltiazem IV does  not seem to help with rate control. IV metoprolol does seem to affect his rate. This is possibly from anti-adrenergic effects. Discussed earlier this morning with primary team. We will go ahead and continue with metoprolol 25 mg by mouth every 6 hours and have the option of metoprolol 5 mg IV every 2 hours. He has shown periods of time where his heart rate will decrease with  metoprolol. Ejection fraction is normal.  Avoiding amiodarone given his underlying lung disease.  2. Hypoxic respiratory failure-he was given initially dose of IV Lasix. Did not respond. ProBNP has been 5296 back in March, 7170 on March 31, 6767 on this admission. It would not be unreasonable to continue with empiric furosemide in case a degree of acute diastolic heart failure is playing a role. This seems to be mostly pulmonary driven however.  3. PE-currently on Eliquis, IVC filter in place  4. Chronic kidney disease-creatinine ranging from 1.3-1.5  We will continue to follow.   SKAINS, [place] 04/19/2014, 1:30 PM

## 2014-04-19 NOTE — Progress Notes (Signed)
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 He presented to the hospital with progressive shortness of breath and lower extremity edema. In addition he was becoming very dizzy and very weak with standing. He notified his cardiologist of these symptoms several days prior to presentation and was instructed to decrease his metoprolol to 25 mg by mouth twice a day. Unfortunately his symptoms did not improve.  He presented to the hospital where was noted that his systolic blood pressure was in the upper 80s and upon standing dropped into the 70s. Review of patient medication reveals he was taking Lasix 20 mg daily and had continued to take this medication prior to presentation. In addition he endorses having to increase his oxygen at home from 2 L to 4 L because of progressive shortness of breath. He presented to the emergency department on 3 L of oxygen but later dropped his saturations and required increased oxygen to 100% nonrebreather. A two-view chest x-ray was completed in the emergency department that demonstrated acutely worsened diffuse bilateral airspace opacification with a peripheral distribution and diffuse increased interstitial markings that could either be acute infectious or inflammatory interstitial pneumonitis. These changes are superimposed on the patient's chronic underlying interstitial changes. Pulmonary edema was considered to be less likely given the unusual distribution. Interestingly enough patient presented with controlled ventricular response and only had increased heart rate in the ER when hypoxemic. Cardiology was  consulted while the patient was in the ER and the cardiologist recommended a one-time dose of Lasix IV  HPI/Subjective: Still with shortness of breath and fatigue. No awareness of tachypalpitations.  Assessment/Plan:     Goodpasture's disease, treated with steroid, oral cytoxan and plasmapheresis  -Unclear of current respiratory symptoms are a manifestation of exacerbation of Goodpasture's or primarily related to volume overload from intermittent atrial flutter with RVR -Continue supportive care with oxygen (see below) -Appreciate pulmonary medicine assistance-chest x-ray is consistent with interstitial lung disease of uncertain etiology, nonetheless due to current respiratory compromise unable to proceed with even CT guided biopsy help clarify diagnosis -Pulmonary pulsing steroids at higher dose for several days to see if helps improve his respiratory symptoms i.e. just in case this may be Goodpasture's exacerbation -If can achieve rate control and adequate diuresis and this may also help clarify etiology of pulmonary symptoms -Continue preadmission immunosuppressants     Atrial flutter with rapid ventricular response -Appreciate cardiology assistance -Again unclear if underlying pneumonitis possibly from Goodpasture's exacerbation has subsequently exacerbated the patient's cardiovascular status resulting in RVR or if the respiratory status is primarily from RVR and associated volume overload -7/22 Cardizem drip seemed ineffective so this was discontinued by the cardiologist in favor of Lopressor. Overnight the Lopressor seemed ineffective so this was discontinued in favor of a Cardizem drip. 7/23 despite Cardizem infusion at 50 mg per hour resting heart rates in the 130s-I. discussed with Dr. [name] and after review of the records it was noted that on 7/22 beta blocker seem to have much better improvement in patient's heart rate with rates down into the 90-100 range for several hours. It was  suspected that he likely needs low-dose high-frequency oral beta blockers therefore we'll attempt to control rate with  several IV doses of Lopressor and then once heart rate between ~100 to 105 plan is to transition to Lopressor 25 mg every 6 hours-this was discussed at length with the patient and his wife-his BP will tolerate may need a combination of beta blockers and calcium channel blockers to achieve rate control -Given underlying lung disease is not a candidate for amiodarone -Patient reports that the possibility of radiofrequency ablation has been discussed if rate control not achievable with medical therapy    Acute on chronic respiratory failure with hypoxia:   A) Acute congestive heart failure/volume   B) Question of exacerbation of pulmonary component of Goodpasture's disease -Continue supportive care with oxygen -Recently treated for community acquired pneumonia the present time does not appear to have acute infectious process requiring antibiotics-pulmonary medicine agrees -Control heart rate in the event that RVR contributing to pulmonary edema-type symptoms and resultant respiratory failure -See above regarding Goodpasture's    Glomerulonephritis/CKD (chronic kidney disease) stage 3, GFR 30-59 ml/min -Appears stable at this time with appropriate urinary output    Anemia of chronic disease -Hemoglobin appears stable and actually slightly higher than normal baseline and is between 7.9 and 9     Thrombocytopenia -Platelets have dropped to 127,000 since admission -Follow closely-note patient on Eliquis     HYPERLIPIDEMIA    HYPERTENSION, BENIGN -See above regarding beta blockers for rate control    History of DVT (deep vein thrombosis) -Was transitioned from warfarin to eliquis during the last hospitalization at recommendation of the pulmonologist in setting of inability to maintain therapeutic INR on warfarin  DVT prophylaxis: Eliquis Code Status: Full Family  Communication: Wife and patient at bedside Disposition Plan/Expected LOS: Step down  Consultants: Cardiology Pulmonology  Procedures: None  Cultures: Blood cultures NGTD Urine culture pending  Antibiotics: None  Objective: Blood pressure 92/75, pulse 136, temperature 98 F (36.7 C), temperature source Oral, resp. rate 30, height 5\' 10"  (1.778 m), weight 168 lb 14 oz (76.6 kg), SpO2 87.00%.  Intake/Output Summary (Last 24 hours) at 04/19/14 1333 Last data filed at 04/19/14 1300  Gross per 24 hour  Intake 1023.25 ml  Output   1300 ml  Net -276.75 ml   Exam: General: Mild acute respiratory distress as evidenced by ongoing orthopnea and dyspnea on room air Lungs: Coarse to auscultation bilaterally with crackles diffusely although less pronounced than on 2023/05/11, 100% nonrebreather Cardiovascular: Irregular rate and rhythm without murmur gallop or rub normal S1 and S2, no peripheral edema  Abdomen: Nontender, nondistended, soft, bowel sounds positive, no rebound, no ascites, no appreciable mass Musculoskeletal: No significant cyanosis, clubbing of bilateral lower extremities   Scheduled Meds:  Scheduled Meds: . apixaban  5 mg Oral BID  . [START ON 05/10/2014] cyanocobalamin  1,000 mcg Intramuscular Q30 days  . cyclobenzaprine  5 mg Oral QHS  . cyclophosphamide  100 mg Oral Q0600  . levalbuterol  1.25 mg Nebulization 4 times per day  . methylPREDNISolone (SOLU-MEDROL) injection  125 mg Intravenous Q6H  . metoprolol  5 mg Intravenous 4 times per day  . metoprolol tartrate  25 mg Oral 4 times per day  . montelukast  10 mg Oral QHS  . sodium chloride  3 mL Intravenous Q12H   Data Reviewed: Basic Metabolic Panel:  Recent Labs Lab 04/19/2014 2027 May 10, 2014 0350 04/19/14 0215  NA 130* 132* 134*  K 4.7 4.4 3.8  CL 89* 92* 95*  CO2 26 28 24   GLUCOSE 83 88 159*  BUN 30* 32* 37*  CREATININE 1.44* 1.55* 1.33  CALCIUM 8.9 8.7 9.0   Liver Function Tests:  Recent Labs Lab  03/29/2014 2027  AST 37  ALT 38  ALKPHOS 61  BILITOT 1.5*  PROT 5.8*  ALBUMIN 2.6*   CBC:  Recent Labs Lab 04/13/2014 2027 04/19/14 0215  WBC 5.2 5.1  NEUTROABS 4.9  --   HGB 10.5* 9.3*  HCT 31.7* 27.2*  MCV 97.5 95.1  PLT 167 127*   Cardiac Enzymes:  Recent Labs Lab 04/18/14 0350 04/18/14 0945 04/18/14 1500  TROPONINI <0.30 <0.30 <0.30   BNP (last 3 results)  Recent Labs  03/05/14 0938 03/30/14 2015 04/11/2014 2027  PROBNP 573.0* 7170.0* 6768.0*    Studies:  Recent x-ray studies have been reviewed in detail by the Attending Physician  Time spent :  51mins      Allison Ellis, ANP [facility] Office  [phone] Pager [phone]   **If unable to reach the above provider after paging please contact the [facility] @ [phone]  On-Call/Text Page:      [email or web address]      password [email or web address]  If 7PM-7AM, please contact night-coverage [email or web address] Password [email or web address] 04/19/2014, 1:33 PM   LOS: 2 days   Examined patient with ANP Ebony Hail, discussed assessment and plan, agree with the above plan. Patient with multiple complex medical problems per patient care> 40 minutes

## 2014-04-19 NOTE — Progress Notes (Signed)
Dr [name] notified about HR remaining in AFlutter 130's after 3 rounds of Metoprolol 5mg  IV and 25mg  PO. No new orders at the current time, will continue to monitor as patient states he is comfortable at the current time.

## 2014-04-20 ENCOUNTER — Encounter (HOSPITAL_COMMUNITY): Admission: EM | Disposition: E | Payer: Self-pay | Source: Home / Self Care | Attending: Internal Medicine

## 2014-04-20 ENCOUNTER — Encounter (HOSPITAL_COMMUNITY): Payer: Self-pay | Admitting: Anesthesiology

## 2014-04-20 ENCOUNTER — Inpatient Hospital Stay (HOSPITAL_COMMUNITY): Payer: Medicare Other | Admitting: Anesthesiology

## 2014-04-20 ENCOUNTER — Encounter (HOSPITAL_COMMUNITY): Payer: Medicare Other | Admitting: Anesthesiology

## 2014-04-20 HISTORY — PX: CARDIOVERSION: SHX1299

## 2014-04-20 LAB — BASIC METABOLIC PANEL
Anion gap: 17 — ABNORMAL HIGH (ref 5–15)
BUN: 43 mg/dL — AB (ref 6–23)
CO2: 23 meq/L (ref 19–32)
CREATININE: 1.28 mg/dL (ref 0.50–1.35)
Calcium: 9.2 mg/dL (ref 8.4–10.5)
Chloride: 95 mEq/L — ABNORMAL LOW (ref 96–112)
GFR calc Af Amer: 62 mL/min — ABNORMAL LOW (ref >90)
GFR calc non Af Amer: 53 mL/min — ABNORMAL LOW (ref >90)
GLUCOSE: 228 mg/dL — AB (ref 70–99)
Potassium: 3.9 mEq/L (ref 3.7–5.3)
Sodium: 135 mEq/L — ABNORMAL LOW (ref 137–147)

## 2014-04-20 LAB — URINE CULTURE

## 2014-04-20 LAB — CBC
HEMATOCRIT: 26.4 % — AB (ref 39.0–52.0)
Hemoglobin: 8.8 g/dL — ABNORMAL LOW (ref 13.0–17.0)
MCH: 32.1 pg (ref 26.0–34.0)
MCHC: 33.3 g/dL (ref 30.0–36.0)
MCV: 96.4 fL (ref 78.0–100.0)
PLATELETS: 147 10*3/uL — AB (ref 150–400)
RBC: 2.74 MIL/uL — ABNORMAL LOW (ref 4.22–5.81)
RDW: 20.8 % — ABNORMAL HIGH (ref 11.5–15.5)
WBC: 5.2 10*3/uL (ref 4.0–10.5)

## 2014-04-20 LAB — MAGNESIUM: Magnesium: 2.2 mg/dL (ref 1.5–2.5)

## 2014-04-20 SURGERY — CARDIOVERSION
Anesthesia: Monitor Anesthesia Care

## 2014-04-20 MED ORDER — MAGNESIUM SULFATE 40 MG/ML IJ SOLN
2.0000 g | Freq: Once | INTRAMUSCULAR | Status: AC
Start: 2014-04-20 — End: 2014-04-20
  Administered 2014-04-20: 2 g via INTRAVENOUS
  Filled 2014-04-20: qty 50

## 2014-04-20 MED ORDER — DARBEPOETIN ALFA-POLYSORBATE 60 MCG/0.3ML IJ SOLN
60.0000 ug | Freq: Once | INTRAMUSCULAR | Status: AC
  Administered 2014-04-20: 60 ug via SUBCUTANEOUS
  Filled 2014-04-20: qty 0.3

## 2014-04-20 MED ORDER — PROPOFOL 10 MG/ML IV BOLUS
INTRAVENOUS | Status: DC | PRN
  Administered 2014-04-20: 60 mg via INTRAVENOUS

## 2014-04-20 MED ORDER — DILTIAZEM HCL 100 MG IV SOLR
5.0000 mg/h | INTRAVENOUS | Status: DC
  Administered 2014-04-20: 5 mg/h via INTRAVENOUS
  Filled 2014-04-20: qty 100

## 2014-04-20 MED ORDER — POTASSIUM CHLORIDE CRYS ER 20 MEQ PO TBCR
20.0000 meq | EXTENDED_RELEASE_TABLET | Freq: Once | ORAL | Status: AC
  Administered 2014-04-20: 20 meq via ORAL
  Filled 2014-04-20: qty 1

## 2014-04-20 MED ORDER — IBUTILIDE FUMARATE 1 MG/10ML IV SOLN
1.0000 mg | Freq: Once | INTRAVENOUS | Status: AC
  Administered 2014-04-20: 1 mg via INTRAVENOUS
  Filled 2014-04-20: qty 10

## 2014-04-20 MED ORDER — DARBEPOETIN ALFA-POLYSORBATE 60 MCG/0.3ML IJ SOLN
60.0000 ug | INTRAMUSCULAR | Status: DC
  Administered 2014-04-25 – 2014-05-02 (×2): 60 ug via SUBCUTANEOUS
  Filled 2014-04-20 (×3): qty 0.3

## 2014-04-20 MED ORDER — BIOTENE DRY MOUTH MT LIQD
15.0000 mL | Freq: Two times a day (BID) | OROMUCOSAL | Status: DC
  Administered 2014-04-20 – 2014-05-08 (×33): 15 mL via OROMUCOSAL

## 2014-04-20 MED ORDER — PANTOPRAZOLE SODIUM 40 MG IV SOLR
40.0000 mg | INTRAVENOUS | Status: DC
  Administered 2014-04-20: 40 mg via INTRAVENOUS
  Filled 2014-04-20: qty 40

## 2014-04-20 NOTE — Progress Notes (Signed)
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  Patient receives procrit 30,000 units weekly. Per patient last dose was 04/10/14.  Formulary conversion: 30,000 units Procrit weekly=  26mcg aranesp Weekly  Plan -Aranesp 62mcg today then on qWed to keep patient on schedule -Will sign off for now. Please contact pharmacy with any other questions.  Thank you,  Hildred Laser, Pharm D 04/13/2014 10:18 AM

## 2014-04-20 NOTE — Progress Notes (Signed)
PULMONARY / CRITICAL CARE MEDICINE  Name: Bryan Hubbard MRN: [id number] DOB: 03-May-1941    ADMISSION DATE:  04/01/2014 CONSULTATION DATE:  03/31/2014  REFERRING MD :  Thereasa Solo  PRIMARY SERVICE:  TRH  CHIEF COMPLAINT:  Hypoxemic respiratory failure  BRIEF PATIENT DESCRIPTION: [name] with PMH of Goodpastures - O2 dependent, presents with worsening SOB, dizziness, weakness and LE swelling.  Cards consulted and did not feel that SOB was due to a primary cardiac reason.  PCCM was consulted for further evaluation.  SIGNIFICANT EVENTS / STUDIES:  3/27 TTE >>> PAP 45 torr 7/3   Chest CT >>> bilateral interstitial lung abnormality without nodules or consolidation, no effusion  7/7   VQ scan >>> intermediate probability of PE, new acute perfusion defect LUL since prior VQ in 12/2013  7/7   PFT >>> restrictive pattern, severe diffusion impairment, significant bronchodilator responce  7/8   Venous duplex >>> no clot   LINES / TUBES:  CULTURES: Urine 7/22 >>> Blood 7/22 >>>  ANTIBIOTICS:  INTERVAL HISTORY:  Did not sleep well last night, otherwise no significant changes.  VITAL SIGNS: Temp:  [97.5 F (36.4 C)-98.1 F (36.7 C)] 97.5 F (36.4 C) (07/24 1200) Pulse Rate:  [114-136] 133 (07/24 1300) Resp:  [14-32] 21 (07/24 1300) BP: (91-123)/(64-102) 94/75 mmHg (07/24 1300) SpO2:  [85 %-100 %] 93 % (07/24 1300) FiO2 (%):  [66 %-80 %] 80 % (07/23 2026) Weight:  [77.474 kg (170 lb 12.8 oz)] 77.474 kg (170 lb 12.8 oz) (07/24 0406)  HEMODYNAMICS:   VENTILATOR SETTINGS: Vent Mode:  [-]  FiO2 (%):  [66 %-80 %] 80 %  INTAKE / OUTPUT: Intake/Output     07/23 0701 - 07/24 0700 07/24 0701 - 07/25 0700   P.O. 590 240   I.V. (mL/kg) 190 (2.5) 85 (1.1)   IV Piggyback  50   Total Intake(mL/kg) 780 (10.1) 375 (4.8)   Urine (mL/kg/hr) 1150 (0.6) 150 (0.2)   Total Output 1150 150   Net -370 +225         PHYSICAL EXAMINATION: General: no distress Neuro: Awake, alert, non focal HEENT:  PERRL Cardiovascular: Irregular, tachycardic Lungs: Few rales Abdomen: Soft, bowl sounds present Musculoskeletal: Trace edema Skin: Intact, warm, no rashes.  LABS: CBC  Recent Labs Lab 03/31/2014 2027 04/19/14 0215 03/28/2014 0230  WBC 5.2 5.1 5.2  HGB 10.5* 9.3* 8.8*  HCT 31.7* 27.2* 26.4*  PLT 167 127* 147*   Coag's  Recent Labs Lab 04/21/2014 2027  INR 1.36   BMET  Recent Labs Lab 04/18/14 0350 04/19/14 0215 04/18/2014 0230  NA 132* 134* 135*  K 4.4 3.8 3.9  CL 92* 95* 95*  CO2 28 24 23   BUN 32* 37* 43*  CREATININE 1.55* 1.33 1.28  GLUCOSE 88 159* 228*   Electrolytes  Recent Labs Lab 04/18/14 0350 04/19/14 0215 04/21/2014 0230 04/05/2014 1140  CALCIUM 8.7 9.0 9.2  --   MG  --   --   --  2.2   Sepsis Markers No results found for this basename: LATICACIDVEN, PROCALCITON, O2SATVEN,  in the last 168 hours  ABG  Recent Labs Lab 03/30/2014 2206  PHART 7.529*  PCO2ART 29.7*  PO2ART 40.0*   Liver Enzymes  Recent Labs Lab 04/23/2014 2027  AST 37  ALT 38  ALKPHOS 61  BILITOT 1.5*  ALBUMIN 2.6*   Cardiac Enzymes  Recent Labs Lab 04/20/2014 2027 04/18/14 0350 04/18/14 0945 04/18/14 1500  TROPONINI  --  <0.30 <0.30 <0.30  PROBNP 6768.0*  --   --   --    Glucose No results found for this basename: GLUCAP,  in the last 168 hours  IMAGING: No results found.  ASSESSMENT / PLAN:  Acute on chronic hypoxemic respiratory failure, multifactorial.   Goal SpO2>92  Supplemental oxygen  Reactive airway disease with significant bronchodilator response. FEV1/FVC likely falsely elevated due to coexisting restrictive disease.   Xopenex PRN  ILD Goodpasture's syndrome   Pulse dose Solu-Medrol ( day 3 )  Convert to Prednisone 60 in am  Cytoxan   Cyclophosphamide  Not a candidate for Bx at this time  PE   Eliquis  S/p IVC filter  Atrial flutter with rapid ventricular response   Per Cardiology  Metoprolol / cardizem  Would avoid  Amiodarone  Cardioversion today  Chronic diastolic heart failure, no evidence of exacerbation   Defer diuresis  Pulmonary hypertension  Immunosuppression  Doubt pneumonia  I have personally obtained history, examined patient, evaluated and interpreted laboratory and imaging results, reviewed medical records, formulated assessment / plan and placed orders.  Doree Fudge, MD Pulmonary and [place] Pager: [phone]  04/21/2014, 2:56 PM

## 2014-04-20 NOTE — Interval H&P Note (Signed)
History and Physical Interval Note:  04/23/2014 2:23 PM  Bryan Hubbard  has presented today for surgery, with the diagnosis of Heart Arrythmia  The various methods of treatment have been discussed with the patient and family. After consideration of risks, benefits and other options for treatment, the patient has consented to  Procedure(s): CARDIOVERSION (N/A) as a surgical intervention .  The patient's history has been reviewed, patient examined, no change in status, stable for surgery.  I have reviewed the patient's chart and labs.  Questions were answered to the patient's satisfaction.     Marge Vandermeulen

## 2014-04-20 NOTE — Progress Notes (Signed)
At Dr. [name]' request, have asked for anesthesia assistance for cardioversion later on today, at risk for requiring intubation. Pt ate this AM just before 9am. Have placed NPO order. Anesthesia believes they will be able to help sometime between 3-4 (allowing time since patient ate). They will contact me 30-40 minutes before they plan to start and I will keep Dr. [name] informed. He plans to try ibutilide for chemical conversion and if this is successful we will report back to anesthesia. Alize Borrayo PA-C

## 2014-04-20 NOTE — Progress Notes (Addendum)
Subjective:  Still having trouble oxygenating. Remained stable from yesterday however. Face mask oxygen. Denies chest pain. Still having trouble controlling his atrial flutter.  Objective:  Vital Signs in the last 24 hours: Temp:  [97.5 F (36.4 C)-98.1 F (36.7 C)] 97.5 F (36.4 C) (07/24 0800) Pulse Rate:  [114-137] 134 (07/24 0700) Resp:  [14-32] 32 (07/24 0800) BP: (82-123)/(57-92) 116/92 mmHg (07/24 0800) SpO2:  [85 %-100 %] 96 % (07/24 1002) FiO2 (%):  [66 %-100 %] 80 % (07/23 2026) Weight:  [170 lb 12.8 oz (77.474 kg)] 170 lb 12.8 oz (77.474 kg) (07/24 0406)  Intake/Output from previous day: 07/23 0701 - 07/24 0700 In: 69 [P.O.:590; I.V.:190] Out: 1150 [Urine:1150]   Physical Exam: General: Well developed, well nourished, in mild respiratory distress. Head:  Normocephalic and atraumatic. Lungs: Coarse bilaterally. Heart: Tachycardic, regular.  No murmur, rubs or gallops.  Abdomen: soft, non-tender, positive bowel sounds. Extremities: No clubbing or cyanosis. No edema. Neurologic: Alert and oriented x 3.    Lab Results:  Recent Labs  04/19/14 0215 03/31/2014 0230  WBC 5.1 5.2  HGB 9.3* 8.8*  PLT 127* 147*    Recent Labs  04/19/14 0215 04/18/2014 0230  NA 134* 135*  K 3.8 3.9  CL 95* 95*  CO2 24 23  GLUCOSE 159* 228*  BUN 37* 43*  CREATININE 1.33 1.28    Recent Labs  04/18/14 0945 04/18/14 1500  TROPONINI <0.30 <0.30   Hepatic Function Panel  Recent Labs  04/07/2014 2027  PROT 5.8*  ALBUMIN 2.6*  AST 37  ALT 38  ALKPHOS 61  BILITOT 1.5*    Telemetry: Atrial flutter. Previously responded to IV metoprolol however currently is not. Personally viewed.   EKG:  Atrial flutter  Cardiac Studies:  EF 50-55% mild pulmonary hypertension, 35 mmHg  . antiseptic oral rinse  15 mL Mouth Rinse BID  . apixaban  5 mg Oral BID  . [START ON 05/10/2014] cyanocobalamin  1,000 mcg Intramuscular Q30 days  . cyclobenzaprine  5 mg Oral QHS  .  cyclophosphamide  100 mg Oral Q0600  . darbepoetin (ARANESP) injection - NON-DIALYSIS  60 mcg Subcutaneous Once  . [START ON 04/25/2014] darbepoetin (ARANESP) injection - NON-DIALYSIS  60 mcg Subcutaneous Q Wed-1800  . ibutilide (CORVERT) IV bolus (>/= 60 kg) Cardioversion  1 mg Intravenous Once  . latanoprost  1 drop Both Eyes q morning - 10a  . levalbuterol  1.25 mg Nebulization 4 times per day  . methylPREDNISolone (SOLU-MEDROL) injection  125 mg Intravenous Q6H  . metoprolol  5 mg Intravenous 4 times per day  . metoprolol tartrate  25 mg Oral 4 times per day  . montelukast  10 mg Oral QHS  . sodium chloride  3 mL Intravenous Q12H   Assessment/Plan:   73 year old male with Goodpasture syndrome, atrial flutter, hypoxemic respiratory failure, chronic kidney disease stage III.  1. Atrial flutter- typical flutter. He has seen Dr. Cristopher Peru in the past. Definitive therapy would be ablation. We discussed today. Temporarily, we will proceed with urgent cardioversion. He has been on anticoagulation for greater than 10 days consistently. Prior to that, he was on Coumadin with an INR of 2.4. He did hold his Coumadin for a day after that before starting Xarelto. Likely he maintained full anticoagulation. Given the urgent nature of cardioversion, we will forego TEE.  I explained to him the higher risk of transesophageal echocardiogram and possibility of mechanical ventilation.   I discussed his case with  Dr. Jolyn Nap. He suggested trying chemical cardioversion with ibutilide which may have a 30-40% success rate with atrial flutter. We discussed the small percentage risk of ventricular fibrillation. Of course, if this occurs, emergent cardioversion will take place. Stroke risk explained however given the nature of his continued hypoxemia and current duration of anticoagulation the risk of stroke is less than the benefit of cardioversion. We have also lined up anesthesia to be present if ibutilide is not  successful. Plan is to provide quick sedation with anesthesia's help and electrical cardioversion if ibutilide is unsuccessful. Hopefully we will be able to avoid mechanical ventilation but he and his family understand that this may be a risk.  He ate breakfast just before 9 AM. He is currently n.p.o. 3 PM projected start time for cardioversion.  Potassium was 3.9-giving 20 mEq Magnesium stat lab. On 04/01/14 his magnesium was 1.7. I will go ahead and administer magnesium IV.  2. Hypoxic respiratory failure-he was given initially dose of IV Lasix. Did not respond. ProBNP has been 5296 back in March, 7170 on March 31, 6767 on this admission. It would not be unreasonable to continue with empiric furosemide in case a degree of acute diastolic heart failure is playing a role.   3. PE-currently on Eliquis, IVC filter in place  4. Chronic kidney disease-creatinine ranging from 1.3-1.5  We will continue to follow.   Ashauna Bertholf, [place] 03/30/2014, 11:16 AM    Addendum:  EKG personally reviewed, QT interval on ECG from 03/28/2014 with variable AV conduction of atrial flutter is clearly less than 400 ms. See precordial leads.  Magnesium 2.2.  Candee Furbish, MD

## 2014-04-20 NOTE — Progress Notes (Signed)
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 He presented to the hospital with progressive shortness of breath and lower extremity edema. In addition he was becoming very dizzy and very weak with standing. He notified his cardiologist of these symptoms several days prior to presentation and was instructed to decrease his metoprolol to 25 mg by mouth twice a day. Unfortunately his symptoms did not improve.  He presented to the hospital where was noted that his systolic blood pressure was in the upper 80s and upon standing dropped into the 70s. Review of patient medication reveals he was taking Lasix 20 mg daily and had continued to take this medication prior to presentation. In addition he endorses having to increase his oxygen at home from 2 L to 4 L because of progressive shortness of breath. He presented to the emergency department on 3 L of oxygen but later dropped his saturations and required increased oxygen to 100% nonrebreather. A two-view chest x-ray was completed in the emergency department that demonstrated acutely worsened diffuse bilateral airspace opacification with a peripheral distribution and diffuse increased interstitial markings that could either be acute infectious or inflammatory interstitial pneumonitis. These changes are superimposed on the patient's chronic underlying interstitial changes. Pulmonary edema was considered to be less likely given the unusual distribution. Interestingly enough patient presented with controlled ventricular response and only had increased heart rate in the ER when hypoxemic. Cardiology was  consulted while the patient was in the ER and the cardiologist recommended a one-time dose of Lasix IV  HPI/Subjective: Stable dyspnea- multiple questions answered per attending regarding current treatment plans and any changes.  Assessment/Plan:     Goodpasture's disease, treated with steroid, oral cytoxan and plasmapheresis  -Unclear of current respiratory symptoms are a manifestation of exacerbation of Goodpasture's or primarily related to volume overload from intermittent atrial flutter with RVR -Continue supportive care with oxygen (see below) -Appreciate pulmonary medicine assistance-chest x-ray is consistent with interstitial lung disease of uncertain etiology, nonetheless due to current respiratory compromise unable to proceed with even CT guided biopsy to help clarify diagnosis -Pulmonary pulsing steroids at higher dose for several days to see if helps improve his respiratory symptoms i.e. just in case this may be Goodpasture's exacerbation-then transition back to Prednisone -If can achieve rate control and adequate diuresis and this may also help clarify etiology of pulmonary symptoms -Continue preadmission immunosuppressants     Atrial flutter with rapid ventricular response -Appreciate cardiology assistance -Again unclear if underlying pneumonitis possibly from Goodpasture's exacerbation has subsequently exacerbated the patient's cardiovascular status resulting in RVR or if the respiratory status is primarily from RVR and associated volume overload -7/22 Cardizem drip seemed ineffective so this was discontinued by the cardiologist in favor of Lopressor. Overnight the Lopressor seemed ineffective so this was discontinued in favor of a Cardizem drip. 7/23 persistent RVR in the 130s despite Cardizem infusion so beta blocker only challenge begun but no improvement in HR 7/24 Cards plans cont Lopressor but add Cardizem gtt back at low dose- if this doesn't work plan Ibutilide per recs of  EP- risks and benefits d/w pt and wife per Dr. [name] -Given underlying lung disease is not a candidate for amiodarone -Patient reports  that the possibility of radiofrequency ablation has been discussed if rate control not achievable with medical therapy    Acute on chronic respiratory failure with hypoxia:   A) Acute congestive heart failure/volume   B) Question of exacerbation of pulmonary component of Goodpasture's disease -Continue supportive care with oxygen -Recently treated for community acquired pneumonia the present time does not appear to have acute infectious process requiring antibiotics-pulmonary medicine agrees -Control heart rate in the event that RVR contributing to pulmonary edema-type symptoms and resultant respiratory failure -See above regarding Goodpasture's    Glomerulonephritis/CKD (chronic kidney disease) stage 3, GFR 30-59 ml/min -Appears stable at this time with appropriate urinary output   Abnormal UA -cx positive for 20,000 colonies of coag neg staph sensitive to Vanco but resistent to Fluoroquinolones and PCNs -already on anbx's for ? HCAP so would cover above    Anemia of chronic disease -Hemoglobin appears stable and actually slightly higher than normal baseline and is between 7.9 and 9 -drifting down since admit but this may be more reflective of volume overload    Thrombocytopenia -Platelets have dropped to 127,000 since admission but back up to 147,000 on 7/24 -Follow closely-note patient on Eliquis     HYPERLIPIDEMIA    HYPERTENSION, BENIGN -See above regarding beta blockers for rate control    History of DVT /PE / IVC filter -Was transitioned from warfarin to eliquis during the last hospitalization at recommendation of the pulmonologist in setting of inability to maintain therapeutic INR on warfarin  DVT prophylaxis: Eliquis Code Status: Full Family Communication: Wife and patient at bedside Disposition Plan/Expected LOS: Step  down  Consultants: Cardiology Pulmonology  Procedures: None  Cultures: Blood cultures NGTD Urine culture coag neg staph  Antibiotics: None  Objective: Blood pressure 108/74, pulse 134, temperature 97.5 F (36.4 C), temperature source Oral, resp. rate 24, height 5\' 10"  (1.778 m), weight 170 lb 12.8 oz (77.474 kg), SpO2 95.00%.  Intake/Output Summary (Last 24 hours) at 04/12/2014 1257 Last data filed at 04/10/2014 1020  Gross per 24 hour  Intake    770 ml  Output   1300 ml  Net   -530 ml   Exam: General: Mild acute respiratory distress as evidenced by orthopnea and dyspnea Lungs: Bilateral crackles diffusely, 100% nonrebreather Cardiovascular: Irregular rate and rhythm without murmur gallop or rub normal S1 and S2, no peripheral edema - rates still persist in 140s Abdomen: Nontender, nondistended, soft, bowel sounds positive, no rebound, no ascites, no appreciable mass Musculoskeletal: No significant cyanosis, clubbing of bilateral lower extremities   Scheduled Meds:  Scheduled Meds: . antiseptic oral rinse  15 mL Mouth Rinse BID  . apixaban  5 mg Oral BID  . [START ON 05/10/2014] cyanocobalamin  1,000 mcg Intramuscular Q30 days  . cyclobenzaprine  5 mg Oral QHS  . cyclophosphamide  100 mg Oral Q0600  . darbepoetin (ARANESP) injection - NON-DIALYSIS  60 mcg Subcutaneous Once  . [START ON 04/25/2014] darbepoetin (ARANESP) injection - NON-DIALYSIS  60 mcg Subcutaneous Q Wed-1800  . ibutilide (CORVERT) IV bolus (>/= 60 kg) Cardioversion  1 mg Intravenous Once  . latanoprost  1 drop Both Eyes q morning - 10a  . levalbuterol  1.25 mg Nebulization 4 times per day  . magnesium sulfate 1 - 4 g bolus IVPB  2 g Intravenous Once  . methylPREDNISolone (SOLU-MEDROL) injection  125 mg Intravenous Q6H  . metoprolol  5 mg Intravenous 4 times per day  . metoprolol tartrate  25 mg Oral 4 times  per day  . montelukast  10 mg Oral QHS  . sodium chloride  3 mL Intravenous Q12H   Data  Reviewed: Basic Metabolic Panel:  Recent Labs Lab 04/21/2014 2027 04/18/14 0350 04/19/14 0215 04/03/2014 0230 04/07/2014 1140  NA 130* 132* 134* 135*  --   K 4.7 4.4 3.8 3.9  --   CL 89* 92* 95* 95*  --   CO2 26 28 24 23   --   GLUCOSE 83 88 159* 228*  --   BUN 30* 32* 37* 43*  --   CREATININE 1.44* 1.55* 1.33 1.28  --   CALCIUM 8.9 8.7 9.0 9.2  --   MG  --   --   --   --  2.2   Liver Function Tests:  Recent Labs Lab 04/09/2014 2027  AST 37  ALT 38  ALKPHOS 61  BILITOT 1.5*  PROT 5.8*  ALBUMIN 2.6*   CBC:  Recent Labs Lab 04/04/2014 2027 04/19/14 0215 04/18/2014 0230  WBC 5.2 5.1 5.2  NEUTROABS 4.9  --   --   HGB 10.5* 9.3* 8.8*  HCT 31.7* 27.2* 26.4*  MCV 97.5 95.1 96.4  PLT 167 127* 147*   Cardiac Enzymes:  Recent Labs Lab 04/18/14 0350 04/18/14 0945 04/18/14 1500  TROPONINI <0.30 <0.30 <0.30   BNP (last 3 results)  Recent Labs  03/05/14 0938 03/30/14 2015 04/13/2014 2027  PROBNP 573.0* 7170.0* 6768.0*   Studies:  Recent x-ray studies have been reviewed in detail by the Attending Physician  Time spent :  42mins      Allison Ellis, [place] [facility] Office  [phone] Pager [phone]   **If unable to reach the above provider after paging please contact the [facility] @ [phone]  On-Call/Text Page:      [email or web address]      password [email or web address]  If 7PM-7AM, please contact night-coverage [email or web address] Password [email or web address] 04/24/2014, 12:57 PM   LOS: 3 days   I have personally examined this patient and reviewed the entire database. I have reviewed the above note, made any necessary editorial changes, and agree with its content.  Cherene Altes, MD [facility]

## 2014-04-20 NOTE — Anesthesia Postprocedure Evaluation (Signed)
  Anesthesia Post-op Note  Patient: Bryan Hubbard  Procedure(s) Performed: Procedure(s): CARDIOVERSION (N/A)  Patient Location: ICU  Anesthesia Type:MAC  Level of Consciousness: awake, alert , oriented and sedated  Airway and Oxygen Therapy: Patient Spontanous Breathing and Patient connected to face mask oxygen  Post-op Pain: none  Post-op Assessment: Post-op Vital signs reviewed, Patient's Cardiovascular Status Stable, Respiratory Function Stable, Patent Airway and No signs of Nausea or vomiting  Post-op Vital Signs: Reviewed and stable  Last Vitals:  Filed Vitals:   04/10/2014 1545  BP: 85/52  Pulse: 71  Temp:   Resp: 22    Complications: No apparent anesthesia complications

## 2014-04-20 NOTE — Transfer of Care (Signed)
Immediate Anesthesia Transfer of Care Note  Patient: Bryan Hubbard  Procedure(s) Performed: Procedure(s): CARDIOVERSION (N/A)  Patient Location: ICU  Anesthesia Type:MAC  Level of Consciousness: awake, alert , oriented and sedated  Airway & Oxygen Therapy: Patient Spontanous Breathing and Patient connected to face mask oxygen  Post-op Assessment: Report given to PACU RN, Post -op Vital signs reviewed and stable and Patient moving all extremities  Post vital signs: Reviewed and stable  Complications: No apparent anesthesia complications

## 2014-04-20 NOTE — CV Procedure (Signed)
Dx.: Aflutter with hypoxia.  Urgent cardioversion.  Time out performed.   Ibutilide infused. Unsuccessful.   With anesthesia, Dr. Girard Cooter, Propofol administered, 60mg .   Shock, 120j x 1. Successful.   Subjectively feels better, less work of breathing.   Ultimately, typical aflutter ablation would be helpful.   Monitor closely.   Candee Furbish, MD

## 2014-04-20 NOTE — Anesthesia Preprocedure Evaluation (Signed)
Anesthesia Evaluation  Patient identified by MRN, date of birth, ID band Patient awake    Reviewed: Allergy & Precautions, H&P , NPO status , Patient's Chart, lab work & pertinent test results, reviewed documented beta blocker date and time   Airway Mallampati: II TM Distance: >3 FB Neck ROM: full    Dental   Pulmonary asthma , pneumonia -, resolved, former smoker,  breath sounds clear to auscultation        Cardiovascular hypertension, On Medications + Peripheral Vascular Disease and +CHF Atrial Fibrillation Rhythm:regular     Neuro/Psych negative neurological ROS  negative psych ROS   GI/Hepatic Neg liver ROS, PUD,   Endo/Other  negative endocrine ROS  Renal/GU Renal InsufficiencyRenal disease  negative genitourinary   Musculoskeletal   Abdominal   Peds  Hematology  (+) anemia ,   Anesthesia Other Findings See surgeon's H&P   Reproductive/Obstetrics negative OB ROS                           Anesthesia Physical Anesthesia Plan  ASA: III  Anesthesia Plan: General   Post-op Pain Management:    Induction: Intravenous  Airway Management Planned: Mask  Additional Equipment:   Intra-op Plan:   Post-operative Plan:   Informed Consent: I have reviewed the patients History and Physical, chart, labs and discussed the procedure including the risks, benefits and alternatives for the proposed anesthesia with the patient or authorized representative who has indicated his/her understanding and acceptance.   Dental Advisory Given  Plan Discussed with: CRNA and Surgeon  Anesthesia Plan Comments:         Anesthesia Quick Evaluation

## 2014-04-20 NOTE — H&P (View-Only) (Signed)
Subjective:  Still having trouble oxygenating. Remained stable from yesterday however. Face mask oxygen. Denies chest pain. Still having trouble controlling his atrial flutter.  Objective:  Vital Signs in the last 24 hours: Temp:  [97.5 F (36.4 C)-98.1 F (36.7 C)] 97.5 F (36.4 C) (07/24 0800) Pulse Rate:  [114-137] 134 (07/24 0700) Resp:  [14-32] 32 (07/24 0800) BP: (82-123)/(57-92) 116/92 mmHg (07/24 0800) SpO2:  [85 %-100 %] 96 % (07/24 1002) FiO2 (%):  [66 %-100 %] 80 % (07/23 2026) Weight:  [170 lb 12.8 oz (77.474 kg)] 170 lb 12.8 oz (77.474 kg) (07/24 0406)  Intake/Output from previous day: 07/23 0701 - 07/24 0700 In: 78 [P.O.:590; I.V.:190] Out: 1150 [Urine:1150]   Physical Exam: General: Well developed, well nourished, in mild respiratory distress. Head:  Normocephalic and atraumatic. Lungs: Coarse bilaterally. Heart: Tachycardic, regular.  No murmur, rubs or gallops.  Abdomen: soft, non-tender, positive bowel sounds. Extremities: No clubbing or cyanosis. No edema. Neurologic: Alert and oriented x 3.    Lab Results:  Recent Labs  04/19/14 0215 03/30/2014 0230  WBC 5.1 5.2  HGB 9.3* 8.8*  PLT 127* 147*    Recent Labs  04/19/14 0215 04/23/2014 0230  NA 134* 135*  K 3.8 3.9  CL 95* 95*  CO2 24 23  GLUCOSE 159* 228*  BUN 37* 43*  CREATININE 1.33 1.28    Recent Labs  04/18/14 0945 04/18/14 1500  TROPONINI <0.30 <0.30   Hepatic Function Panel  Recent Labs  04/15/2014 2027  PROT 5.8*  ALBUMIN 2.6*  AST 37  ALT 38  ALKPHOS 61  BILITOT 1.5*    Telemetry: Atrial flutter. Previously responded to IV metoprolol however currently is not. Personally viewed.   EKG:  Atrial flutter  Cardiac Studies:  EF 50-55% mild pulmonary hypertension, 35 mmHg  . antiseptic oral rinse  15 mL Mouth Rinse BID  . apixaban  5 mg Oral BID  . [START ON 05/10/2014] cyanocobalamin  1,000 mcg Intramuscular Q30 days  . cyclobenzaprine  5 mg Oral QHS  .  cyclophosphamide  100 mg Oral Q0600  . darbepoetin (ARANESP) injection - NON-DIALYSIS  60 mcg Subcutaneous Once  . [START ON 04/25/2014] darbepoetin (ARANESP) injection - NON-DIALYSIS  60 mcg Subcutaneous Q Wed-1800  . ibutilide (CORVERT) IV bolus (>/= 60 kg) Cardioversion  1 mg Intravenous Once  . latanoprost  1 drop Both Eyes q morning - 10a  . levalbuterol  1.25 mg Nebulization 4 times per day  . methylPREDNISolone (SOLU-MEDROL) injection  125 mg Intravenous Q6H  . metoprolol  5 mg Intravenous 4 times per day  . metoprolol tartrate  25 mg Oral 4 times per day  . montelukast  10 mg Oral QHS  . sodium chloride  3 mL Intravenous Q12H   Assessment/Plan:   73 year old male with Goodpasture syndrome, atrial flutter, hypoxemic respiratory failure, chronic kidney disease stage III.  1. Atrial flutter- typical flutter. He has seen Dr. Cristopher Peru in the past. Definitive therapy would be ablation. We discussed today. Temporarily, we will proceed with urgent cardioversion. He has been on anticoagulation for greater than 10 days consistently. Prior to that, he was on Coumadin with an INR of 2.4. He did hold his Coumadin for a day after that before starting Xarelto. Likely he maintained full anticoagulation. Given the urgent nature of cardioversion, we will forego TEE.  I explained to him the higher risk of transesophageal echocardiogram and possibility of mechanical ventilation.   I discussed his case with  Dr. Jolyn Nap. He suggested trying chemical cardioversion with ibutilide which may have a 30-40% success rate with atrial flutter. We discussed the small percentage risk of ventricular fibrillation. Of course, if this occurs, emergent cardioversion will take place. Stroke risk explained however given the nature of his continued hypoxemia and current duration of anticoagulation the risk of stroke is less than the benefit of cardioversion. We have also lined up anesthesia to be present if ibutilide is not  successful. Plan is to provide quick sedation with anesthesia's help and electrical cardioversion if ibutilide is unsuccessful. Hopefully we will be able to avoid mechanical ventilation but he and his family understand that this may be a risk.  He ate breakfast just before 9 AM. He is currently n.p.o. 3 PM projected start time for cardioversion.  Potassium was 3.9-giving 20 mEq Magnesium stat lab. On 04/01/14 his magnesium was 1.7. I will go ahead and administer magnesium IV.  2. Hypoxic respiratory failure-he was given initially dose of IV Lasix. Did not respond. ProBNP has been 5296 back in March, 7170 on March 31, 6767 on this admission. It would not be unreasonable to continue with empiric furosemide in case a degree of acute diastolic heart failure is playing a role.   3. PE-currently on Eliquis, IVC filter in place  4. Chronic kidney disease-creatinine ranging from 1.3-1.5  We will continue to follow.   SKAINS, [place] 04/24/2014, 11:16 AM    Addendum:  EKG personally reviewed, QT interval on ECG from 04/01/2014 with variable AV conduction of atrial flutter is clearly less than 400 ms. See precordial leads.  Magnesium 2.2.  Candee Furbish, MD

## 2014-04-21 LAB — BASIC METABOLIC PANEL
ANION GAP: 16 — AB (ref 5–15)
BUN: 50 mg/dL — ABNORMAL HIGH (ref 6–23)
CALCIUM: 9.4 mg/dL (ref 8.4–10.5)
CO2: 25 meq/L (ref 19–32)
CREATININE: 1.29 mg/dL (ref 0.50–1.35)
Chloride: 97 mEq/L (ref 96–112)
GFR calc non Af Amer: 53 mL/min — ABNORMAL LOW (ref >90)
GFR, EST AFRICAN AMERICAN: 61 mL/min — AB (ref >90)
Glucose, Bld: 144 mg/dL — ABNORMAL HIGH (ref 70–99)
Potassium: 4.4 mEq/L (ref 3.7–5.3)
SODIUM: 138 meq/L (ref 137–147)

## 2014-04-21 LAB — CBC
HCT: 26 % — ABNORMAL LOW (ref 39.0–52.0)
Hemoglobin: 8.7 g/dL — ABNORMAL LOW (ref 13.0–17.0)
MCH: 32.5 pg (ref 26.0–34.0)
MCHC: 33.5 g/dL (ref 30.0–36.0)
MCV: 97 fL (ref 78.0–100.0)
PLATELETS: 131 10*3/uL — AB (ref 150–400)
RBC: 2.68 MIL/uL — AB (ref 4.22–5.81)
RDW: 20.9 % — AB (ref 11.5–15.5)
WBC: 3.6 10*3/uL — ABNORMAL LOW (ref 4.0–10.5)

## 2014-04-21 MED ORDER — TAMSULOSIN HCL 0.4 MG PO CAPS
0.4000 mg | ORAL_CAPSULE | Freq: Every day | ORAL | Status: DC
  Administered 2014-04-21 – 2014-05-06 (×16): 0.4 mg via ORAL
  Filled 2014-04-21 (×18): qty 1

## 2014-04-21 MED ORDER — DUTASTERIDE 0.5 MG PO CAPS
0.5000 mg | ORAL_CAPSULE | Freq: Every day | ORAL | Status: DC
  Administered 2014-04-21 – 2014-05-06 (×16): 0.5 mg via ORAL
  Filled 2014-04-21 (×21): qty 1

## 2014-04-21 MED ORDER — PREDNISONE 50 MG PO TABS
60.0000 mg | ORAL_TABLET | Freq: Every day | ORAL | Status: DC
  Administered 2014-04-22 – 2014-04-26 (×5): 60 mg via ORAL
  Filled 2014-04-21 (×6): qty 1

## 2014-04-21 MED ORDER — FUROSEMIDE 10 MG/ML IJ SOLN
40.0000 mg | Freq: Every day | INTRAMUSCULAR | Status: DC
  Administered 2014-04-21 – 2014-04-23 (×3): 40 mg via INTRAVENOUS
  Filled 2014-04-21 (×4): qty 4

## 2014-04-21 MED ORDER — PREDNISONE 50 MG PO TABS: 60.0000 mg | ORAL_TABLET | Freq: Every day | ORAL | Status: DC

## 2014-04-21 MED ORDER — DUTASTERIDE-TAMSULOSIN HCL 0.5-0.4 MG PO CAPS: 1.0000 | ORAL_CAPSULE | Freq: Every day | ORAL | Status: DC

## 2014-04-21 MED ORDER — PANTOPRAZOLE SODIUM 40 MG PO TBEC
40.0000 mg | DELAYED_RELEASE_TABLET | Freq: Every day | ORAL | Status: DC
  Administered 2014-04-21 – 2014-05-06 (×15): 40 mg via ORAL
  Filled 2014-04-21 (×15): qty 1

## 2014-04-21 NOTE — Progress Notes (Signed)
Bryan Hubbard  Bryan Hubbard QPR:[id number] DOB: 1941/04/29 DOA: 03/31/2014 PCP: [name], MD  Brief narrative: 65 year patient with underlying chronic atrial flutter, recent DVT, Goodpasture's, and recent oxygen dependent respiratory failure after hospitalization for community acquired pneumonia last month who presented to the hospital with progressive shortness of breath and lower extremity edema. In addition he was becoming very dizzy and very weak with standing. He notified his cardiologist of these symptoms several days prior to presentation and was instructed to decrease his metoprolol to 25 mg by mouth twice a day. Unfortunately his symptoms did not improve.  He presented to the hospital where it was noted that his systolic blood pressure was in the upper 80s and upon standing dropped into the 70s. Review of patient medication revealed he was taking Lasix 20 mg daily and had continued to take this medication prior to presentation. In addition he endorsed having to increase his oxygen at home from 2 L to 4 L because of progressive shortness of breath. A two-view chest x-ray demonstrated acutely worsened diffuse bilateral airspace opacification with a peripheral distribution and diffuse increased interstitial markings that could either be acute infectious or inflammatory interstitial pneumonitis.   HPI/Subjective: Pt feels much better today, though attempts to wean him to nasal cannula O2 failed last PM.  He denies CP, abdom pain, or worsening SOB.    Assessment/Plan:  Atrial flutter with rapid ventricular response -as per Cardiology  -failed attempts to use cardizem, BB, or both to control RVR -Ibutilide infusion unsuccessful > DCCV successful under bedside anesthesia maintaining NSR at this time   Goodpasture's -chronically treated with steroid, oral cytoxan and prn plasmapheresis  -unclear if current respiratory symptoms are a manifestation  of Goodpasture's or primarily related to atrial flutter with RVR -per Pulmonary chest x-ray is consistent with interstitial lung disease of uncertain etiology - due to current respiratory compromise unable to proceed with biopsy to help clarify diagnosis -pulse steroids at higher dose for several days  -continue preadmission immunosuppressants  Acute on chronic respiratory failure with hypoxia   A) Acute reate related diastolic congestive heart failure exacerbation    B) Pulmonary Goodpasture's  -See above -attempt to gently diurese   Glomerulonephritis / Chronic kidney disease stage 3, GFR 30-59 ml/min -Appears stable at this time with appropriate urinary output  Abnormal UA -cx positive only for 20,000 colonies of coag neg staph - further tx not currently indicated   Hematuria  -pt states this is a recurring problem which has improve w/ his use of Jalyn - now that BP improved will resume and follow - suspect more related to eliquis + catheter   Anemia of chronic disease -Hemoglobin stable - baseline between 7.9 and 9   Thrombocytopenia -follow closely   HYPERLIPIDEMIA  HYPERTENSION -BP well controlled  History of DVT / PE / IVC filter -Was transitioned from warfarin to eliquis during the last hospitalization at recommendation of the Pulmonologist in setting of inability to maintain therapeutic INR on warfarin  DVT prophylaxis: Eliquis Code Status: Full Family Communication: Wife and patient at bedside at lenght Disposition Plan/Expected LOS: Step down  Consultants: Cardiology Pulmonology  Procedures: 7/24 - bedside DCCV  Positive Cultures: Urine culture coag neg staph  Antibiotics: None  Objective: Blood pressure 122/77, pulse 85, temperature 97.5 F (36.4 C), temperature source Oral, resp. rate 25, height 5\' 10"  (1.778 m), weight 77.157 kg (170 lb 1.6 oz), SpO2 94.00%.  Intake/Output Summary (Last  24 hours) at 04/21/14 1572 Last data filed at 04/21/14  0800  Gross per 24 hour  Intake    940 ml  Output   1600 ml  Net   -660 ml   Exam: General: Mild acute respiratory distress but less pronounced than yesterday  Lungs: Bilateral crackles diffusely - no wheeze Cardiovascular: regular rate and rhythm presently, w/ occasional ectopic beats w/o gallup or rub  Abdomen: Nontender, nondistended, soft, bowel sounds positive, no rebound, no ascites, no appreciable mass Musculoskeletal: No significant cyanosis, clubbing of bilateral lower extremities - trace B LE edema   Scheduled Meds:  Scheduled Meds: . antiseptic oral rinse  15 mL Mouth Rinse BID  . apixaban  5 mg Oral BID  . [START ON 05/10/2014] cyanocobalamin  1,000 mcg Intramuscular Q30 days  . cyclobenzaprine  5 mg Oral QHS  . cyclophosphamide  100 mg Oral Q0600  . [START ON 04/25/2014] darbepoetin (ARANESP) injection - NON-DIALYSIS  60 mcg Subcutaneous Q Wed-1800  . latanoprost  1 drop Both Eyes q morning - 10a  . levalbuterol  1.25 mg Nebulization 4 times per day  . metoprolol tartrate  25 mg Oral 4 times per day  . montelukast  10 mg Oral QHS  . pantoprazole (PROTONIX) IV  40 mg Intravenous Q24H  . sodium chloride  3 mL Intravenous Q12H   Data Reviewed: Basic Metabolic Panel:  Recent Labs Lab 03/30/2014 2027 04/18/14 0350 04/19/14 0215 04/11/2014 0230 04/07/2014 1140 04/21/14 0245  NA 130* 132* 134* 135*  --  138  K 4.7 4.4 3.8 3.9  --  4.4  CL 89* 92* 95* 95*  --  97  CO2 26 28 24 23   --  25  GLUCOSE 83 88 159* 228*  --  144*  BUN 30* 32* 37* 43*  --  50*  CREATININE 1.44* 1.55* 1.33 1.28  --  1.29  CALCIUM 8.9 8.7 9.0 9.2  --  9.4  MG  --   --   --   --  2.2  --    Liver Function Tests:  Recent Labs Lab 04/25/2014 2027  AST 37  ALT 38  ALKPHOS 61  BILITOT 1.5*  PROT 5.8*  ALBUMIN 2.6*   CBC:  Recent Labs Lab 03/31/2014 2027 04/19/14 0215 04/06/2014 0230 04/21/14 0245  WBC 5.2 5.1 5.2 3.6*  NEUTROABS 4.9  --   --   --   HGB 10.5* 9.3* 8.8* 8.7*  HCT 31.7*  27.2* 26.4* 26.0*  MCV 97.5 95.1 96.4 97.0  PLT 167 127* 147* 131*   Cardiac Enzymes:  Recent Labs Lab 04/18/14 0350 04/18/14 0945 04/18/14 1500  TROPONINI <0.30 <0.30 <0.30   BNP (last 3 results)  Recent Labs  03/05/14 0938 03/30/14 2015 04/08/2014 2027  PROBNP 573.0* 7170.0* 6768.0*   Studies:  Recent x-ray studies have been reviewed in detail by the Attending Physician  Time spent :  27mins  Bryan Altes, MD [facility] For Consults/Admissions - Flow Manager - [phone] Office  [phone] Pager [phone]  On-Call/Text Page:      [email or web address]      password [facility]  04/21/2014, 9:18 AM   LOS: 4 days

## 2014-04-21 NOTE — Progress Notes (Signed)
    Subjective:  Complains of dyspnea; no chest pain  Objective:  Vital Signs in the last 24 hours: Temp:  [97.3 F (36.3 C)-98.2 F (36.8 C)] 97.6 F (36.4 C) (07/25 1200) Pulse Rate:  [30-133] 72 (07/25 1200) Resp:  [12-36] 21 (07/25 1200) BP: (85-147)/(49-88) 127/62 mmHg (07/25 1200) SpO2:  [77 %-100 %] 89 % (07/25 1200) FiO2 (%):  [60 %-80 %] 60 % (07/25 0901) Weight:  [170 lb 1.6 oz (77.157 kg)] 170 lb 1.6 oz (77.157 kg) (07/25 0300)  Intake/Output from previous day: 07/24 0701 - 07/25 0700 In: 950 [P.O.:730; I.V.:120; IV Piggyback:100] Out: 1600 [Urine:1600]   Physical Exam: General: Well developed, well nourished, in mild respiratory distress. Head:  Normocephalic and atraumatic. Lungs: Mild rhonchi Heart: RRR Abdomen: soft, non-tender, not distended Extremities: 1+ ankle edema Neurologic: Alert and oriented x 3.    Lab Results:  Recent Labs  04/06/2014 0230 04/21/14 0245  WBC 5.2 3.6*  HGB 8.8* 8.7*  PLT 147* 131*    Recent Labs  04/25/2014 0230 04/21/14 0245  NA 135* 138  K 3.9 4.4  CL 95* 97  CO2 23 25  GLUCOSE 228* 144*  BUN 43* 50*  CREATININE 1.28 1.29    Recent Labs  04/18/14 1500  TROPONINI <0.30    Telemetry: sinus   Cardiac Studies:  EF 50-55% mild pulmonary hypertension, 35 mmHg  . antiseptic oral rinse  15 mL Mouth Rinse BID  . apixaban  5 mg Oral BID  . [START ON 05/10/2014] cyanocobalamin  1,000 mcg Intramuscular Q30 days  . cyclobenzaprine  5 mg Oral QHS  . cyclophosphamide  100 mg Oral Q0600  . [START ON 04/25/2014] darbepoetin (ARANESP) injection - NON-DIALYSIS  60 mcg Subcutaneous Q Wed-1800  . tamsulosin  0.4 mg Oral Daily   And  . dutasteride  0.5 mg Oral Daily  . furosemide  40 mg Intravenous Daily  . latanoprost  1 drop Both Eyes q morning - 10a  . levalbuterol  1.25 mg Nebulization 4 times per day  . metoprolol tartrate  25 mg Oral 4 times per day  . montelukast  10 mg Oral QHS  . pantoprazole  40 mg Oral Q1200   . 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  1. Atrial flutter- typical flutter. S/P urgent DCCV yesterday; continue anticoagulation and beta blocker.  2. Hypoxic respiratory failure-Plan continue gentle diuresis but may need to DC soon if renal function continues to deteriorate. Continue therapy for possible contribution from Goodpasture syndrome (steroids and cytoxan).  3. PE-currently on Eliquis, IVC filter in place  4. Chronic kidney disease-creatinine ranging from 1.3-1.5  We will continue to follow.   Kirk Ruths 04/21/2014, 12:43 PM    Addendum:  EKG personally reviewed, QT interval on ECG from 04/26/2014 with variable AV conduction of atrial flutter is clearly less than 400 ms. See precordial leads.  Magnesium 2.2.  Kirk Ruths, MD

## 2014-04-22 ENCOUNTER — Inpatient Hospital Stay (HOSPITAL_COMMUNITY): Payer: Medicare Other

## 2014-04-22 NOTE — Progress Notes (Signed)
    Subjective:  Complains of dyspnea; no chest pain  Objective:  Vital Signs in the last 24 hours: Temp:  [97.3 F (36.3 C)-98.4 F (36.9 C)] 97.5 F (36.4 C) (07/26 1151) Pulse Rate:  [48-107] 101 (07/26 1151) Resp:  [19-30] 25 (07/26 1151) BP: (105-131)/(55-78) 125/71 mmHg (07/26 1151) SpO2:  [85 %-95 %] 95 % (07/26 1151) FiO2 (%):  [60 %] 60 % (07/26 0845) Weight:  [167 lb 5.3 oz (75.9 kg)] 167 lb 5.3 oz (75.9 kg) (07/26 0500)  Intake/Output from previous day: 07/25 0701 - 07/26 0700 In: 800 [P.O.:800] Out: 1561 [Urine:1560; Stool:1]   Physical Exam: General: Well developed, well nourished, in mild respiratory distress. Head:  Normocephalic and atraumatic. Lungs: Mild rhonchi Heart: RRR Abdomen: soft, non-tender, not distended Extremities: 1+ ankle edema Neurologic: Alert and oriented x 3.    Lab Results:  Recent Labs  04/16/2014 0230 04/21/14 0245  WBC 5.2 3.6*  HGB 8.8* 8.7*  PLT 147* 131*    Recent Labs  04/21/2014 0230 04/21/14 0245  NA 135* 138  K 3.9 4.4  CL 95* 97  CO2 23 25  GLUCOSE 228* 144*  BUN 43* 50*  CREATININE 1.28 1.29   No results found for this basename: TROPONINI, CK, MB,  in the last 72 hours  Telemetry: sinus   Cardiac Studies:  EF 50-55% mild pulmonary hypertension, 35 mmHg  . antiseptic oral rinse  15 mL Mouth Rinse BID  . apixaban  5 mg Oral BID  . [START ON 05/10/2014] cyanocobalamin  1,000 mcg Intramuscular Q30 days  . cyclobenzaprine  5 mg Oral QHS  . cyclophosphamide  100 mg Oral Q0600  . [START ON 04/25/2014] darbepoetin (ARANESP) injection - NON-DIALYSIS  60 mcg Subcutaneous Q Wed-1800  . tamsulosin  0.4 mg Oral Daily   And  . dutasteride  0.5 mg Oral Daily  . furosemide  40 mg Intravenous Daily  . latanoprost  1 drop Both Eyes q morning - 10a  . levalbuterol  1.25 mg Nebulization 4 times per day  . metoprolol tartrate  25 mg Oral 4 times per day  . montelukast  10 mg Oral QHS  . pantoprazole  40 mg Oral Q1200    . 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  1. Atrial flutter- typical flutter. S/P urgent DCCV 7/24; continue anticoagulation and beta blocker.  2. Hypoxic respiratory failure-Plan continue gentle diuresis and follow renal function. Continue therapy for possible contribution from Goodpasture syndrome (steroids and cytoxan).  3. PE-currently on Eliquis, IVC filter in place  4. Chronic kidney disease-follow  We will continue to follow.   Bryan Hubbard 04/22/2014, 11:54 AM

## 2014-04-22 NOTE — Progress Notes (Signed)
Had discussion with patient's wife regarding her perception of how the patient is progressing.  She feels he has endured a rapid decline over the past 4 months and does not know to deal with the emotional aspects of his illness.  She expresses that she does not feel Eloquis is the "right medication" for him and is deeply concerned about his hematuria.  He is showing signs of high anxiety and is expressing interest towards use of a low dose anti-anxiety medication.  It is the belief of this writer that they may both be receptive to palliative care treatment in the future.  She wants to discuss code status with the PCP soon, but also wants a discussion to take place regarding quality of life concerns she has.

## 2014-04-22 NOTE — Progress Notes (Signed)
PULMONARY / CRITICAL CARE MEDICINE  Name: Bryan Hubbard MRN: [id number] DOB: 09-Oct-1940    ADMISSION DATE:  03/31/2014 CONSULTATION DATE:  03/31/2014  REFERRING MD :  Thereasa Solo  PRIMARY SERVICE:  TRH  CHIEF COMPLAINT:  Hypoxemic respiratory failure  BRIEF PATIENT DESCRIPTION: [name] with PMH of Goodpastures - O2 dependent, presents with worsening SOB, dizziness, weakness and LE swelling.  Cards consulted and did not feel that SOB was due to a primary cardiac reason.  PCCM was consulted for further evaluation.  SIGNIFICANT EVENTS / STUDIES:  3/27 TTE >>> PAP 45 torr 7/3   Chest CT >>> bilateral interstitial lung abnormality without nodules or consolidation, no effusion  7/7   VQ scan >>> intermediate probability of PE, new acute perfusion defect LUL since prior VQ in 12/2013  7/7   PFT >>> restrictive pattern, severe diffusion impairment, significant bronchodilator responce  7/8   Venous duplex >>> no clot   LINES / TUBES:  CULTURES: Urine 7/22 >> coag neg staph (likely contaminant) Blood 7/22 >>   ANTIBIOTICS:  INTERVAL HISTORY:   No new complaints. NAD  VITAL SIGNS: Temp:  [97.3 F (36.3 C)-98.4 F (36.9 C)] 97.3 F (36.3 C) (07/26 0755) Pulse Rate:  [48-107] 67 (07/26 0755) Resp:  [19-30] 25 (07/26 0755) BP: (105-131)/(55-78) 109/69 mmHg (07/26 0755) SpO2:  [85 %-95 %] 94 % (07/26 0845) FiO2 (%):  [60 %] 60 % (07/26 0845) Weight:  [75.9 kg (167 lb 5.3 oz)] 75.9 kg (167 lb 5.3 oz) (07/26 0500)  HEMODYNAMICS:   VENTILATOR SETTINGS: Vent Mode:  [-]  FiO2 (%):  [60 %] 60 %  INTAKE / OUTPUT: Intake/Output     07/25 0701 - 07/26 0700 07/26 0701 - 07/27 0700   P.O. 800    I.V. (mL/kg)     IV Piggyback     Total Intake(mL/kg) 800 (10.5)    Urine (mL/kg/hr) 1560 (0.9)    Stool 1 (0)    Total Output 1561     Net -761           PHYSICAL EXAMINATION: General: no distress Neuro: Awake, alert, non focal HEENT: PERRL Cardiovascular: Irregular, tachycardic Lungs:  Few rales Abdomen: Soft, bowl sounds present Musculoskeletal: Trace edema Skin: Intact, warm, no rashes.  LABS: I have reviewed all of today's lab results. Relevant abnormalities are discussed in the A/P section  CXR: NNF ASSESSMENT / PLAN:  Acute on chronic hypoxemic respiratory failure Reactive airway disease with significant bronchodilator response ILD Goodpasture's syndrome H/O PE, Chronic anticoagulation and S/P IVC filter Moderate pulmonary hypertension Chronic immunosuppression Cont prednisone Recheck 2V CXR  Cont Eliquis Cont supp O2 - wean as tolerated  AFRVR > SR s/p DCCV, Cards managing Possible component of edema Chronic diastolic heart failure Mgmt per Cards    [name], MD Pulmonary and [place] Pager: [phone]  04/22/2014, 11:19 AM

## 2014-04-22 NOTE — Progress Notes (Signed)
[name]  AULDEN CALISE TTS:[id number] DOB: 1941-03-22 DOA: 04/19/2014 PCP: [name], MD  Brief narrative: 52 year patient with underlying chronic atrial flutter, recent DVT, Goodpasture's, and recent oxygen dependent respiratory failure after hospitalization for community acquired pneumonia last month who presented to the hospital with progressive shortness of breath and lower extremity edema. In addition he was becoming very dizzy and very weak with standing. He notified his cardiologist of these symptoms several days prior to presentation and was instructed to decrease his metoprolol to 25 mg by mouth twice a day. Unfortunately his symptoms did not improve.  He presented to the hospital where it was noted that his systolic blood pressure was in the upper 80s and upon standing dropped into the 70s. Review of patient medication revealed he was taking Lasix 20 mg daily and had continued to take this medication prior to presentation. In addition he endorsed having to increase his oxygen at home from 2 L to 4 L because of progressive shortness of breath. A two-view chest x-ray demonstrated acutely worsened diffuse bilateral airspace opacification with a peripheral distribution and diffuse increased interstitial markings that could either be acute infectious or inflammatory interstitial pneumonitis.   HPI/Subjective: No signif change in sx complex today.  Cont to feel SOB w/o signif improvement since yesterday.  Cont to require face mask O2.    Assessment/Plan:  Atrial flutter with rapid ventricular response -as per Cardiology  -failed attempts to use cardizem, BB, or both to control RVR -Ibutilide infusion unsuccessful > DCCV successful under bedside anesthesia -maintaining NSR at this time   Goodpasture's -chronically treated with steroid, oral cytoxan and prn plasmapheresis  -unclear if current respiratory symptoms are a manifestation of  Goodpasture's or primarily related to atrial flutter with RVR -per Pulmonary chest x-ray is consistent with interstitial lung disease of uncertain etiology - due to current respiratory compromise unable to proceed with biopsy to help clarify diagnosis -pulse steroids at higher dose for several days  -continue preadmission immunosuppressants  Acute on chronic respiratory failure with hypoxia   A) Acute reate related diastolic congestive heart failure exacerbation    B) Pulmonary Goodpasture's  -See above -attempting to gently diurese   Glomerulonephritis / Chronic kidney disease stage 3, GFR 30-59 ml/min -Appears stable at this time with appropriate urinary output  Abnormal UA -cx positive only for 20,000 colonies of coag neg staph - further tx not currently indicated   Hematuria  -pt states this is a recurring problem which has improve w/ his use of Jalyn - now that BP improved will resume and follow - suspect more related to eliquis + catheter   Anemia of chronic disease -Hemoglobin stable - baseline between 7.9 and 9   Thrombocytopenia -follow closely   HYPERLIPIDEMIA  HYPERTENSION -BP well controlled  History of DVT / PE / IVC filter -was transitioned from warfarin to eliquis during the last hospitalization at recommendation of the Pulmonologist in setting of inability to maintain therapeutic INR on warfarin  DVT prophylaxis: Eliquis Code Status: Full Family Communication: no family present at time of exam today  Disposition Plan/Expected LOS: Step down  Consultants: Cardiology Pulmonology  Procedures: 7/24 - bedside DCCV  Positive Cultures: Urine culture coag neg staph  Antibiotics: None  Objective: Blood pressure 125/71, pulse 101, temperature 97.5 F (36.4 C), temperature source Oral, resp. rate 25, height 5\' 10"  (1.778 m), weight 75.9 kg (167 lb 5.3 oz), SpO2 95.00%.  Intake/Output Summary (Last  24 hours) at 04/22/14 1450 Last data filed at 04/22/14  0600  Gross per 24 hour  Intake      0 ml  Output    961 ml  Net   -961 ml   Exam: General: Mild acute respiratory distress w/o signif change  Lungs: Bilateral crackles diffusely - no wheeze Cardiovascular: regular rate and rhythm presently w/o gallup or rub  Abdomen: Nontender, nondistended, soft, bowel sounds positive, no rebound, no ascites, no appreciable mass Musculoskeletal: No significant cyanosis, clubbing of bilateral lower extremities - trace B LE edema   Scheduled Meds:  Scheduled Meds: . antiseptic oral rinse  15 mL Mouth Rinse BID  . apixaban  5 mg Oral BID  . [START ON 05/10/2014] cyanocobalamin  1,000 mcg Intramuscular Q30 days  . cyclobenzaprine  5 mg Oral QHS  . cyclophosphamide  100 mg Oral Q0600  . [START ON 04/25/2014] darbepoetin (ARANESP) injection - NON-DIALYSIS  60 mcg Subcutaneous Q Wed-1800  . tamsulosin  0.4 mg Oral Daily   And  . dutasteride  0.5 mg Oral Daily  . furosemide  40 mg Intravenous Daily  . latanoprost  1 drop Both Eyes q morning - 10a  . levalbuterol  1.25 mg Nebulization 4 times per day  . metoprolol tartrate  25 mg Oral 4 times per day  . montelukast  10 mg Oral QHS  . pantoprazole  40 mg Oral Q1200  . predniSONE  60 mg Oral Q breakfast   Data Reviewed: Basic Metabolic Panel:  Recent Labs Lab 04/21/2014 2027 04/18/14 0350 04/19/14 0215 04/03/2014 0230 04/09/2014 1140 04/21/14 0245  NA 130* 132* 134* 135*  --  138  K 4.7 4.4 3.8 3.9  --  4.4  CL 89* 92* 95* 95*  --  97  CO2 26 28 24 23   --  25  GLUCOSE 83 88 159* 228*  --  144*  BUN 30* 32* 37* 43*  --  50*  CREATININE 1.44* 1.55* 1.33 1.28  --  1.29  CALCIUM 8.9 8.7 9.0 9.2  --  9.4  MG  --   --   --   --  2.2  --    Liver Function Tests:  Recent Labs Lab 04/01/2014 2027  AST 37  ALT 38  ALKPHOS 61  BILITOT 1.5*  PROT 5.8*  ALBUMIN 2.6*   CBC:  Recent Labs Lab 04/04/2014 2027 04/19/14 0215 04/24/2014 0230 04/21/14 0245  WBC 5.2 5.1 5.2 3.6*  NEUTROABS 4.9  --   --    --   HGB 10.5* 9.3* 8.8* 8.7*  HCT 31.7* 27.2* 26.4* 26.0*  MCV 97.5 95.1 96.4 97.0  PLT 167 127* 147* 131*   Cardiac Enzymes:  Recent Labs Lab 04/18/14 0350 04/18/14 0945 04/18/14 1500  TROPONINI <0.30 <0.30 <0.30   BNP (last 3 results)  Recent Labs  03/05/14 0938 03/30/14 2015 04/22/2014 2027  PROBNP 573.0* 7170.0* 6768.0*   Studies:  Recent x-ray studies have been reviewed in detail by the Attending Physician  Time spent :  54mins  Cherene Altes, MD [facility] For Consults/Admissions - Flow Manager - [phone] Office  [phone] Pager [phone]  On-Call/Text Page:      [email or web address]      password [facility]  04/22/2014, 2:50 PM   LOS: 5 days

## 2014-04-23 ENCOUNTER — Encounter (HOSPITAL_COMMUNITY): Payer: Self-pay | Admitting: Radiology

## 2014-04-23 ENCOUNTER — Inpatient Hospital Stay (HOSPITAL_COMMUNITY): Payer: Medicare Other

## 2014-04-23 DIAGNOSIS — J189 Pneumonia, unspecified organism: Secondary | ICD-10-CM

## 2014-04-23 LAB — BASIC METABOLIC PANEL
ANION GAP: 20 — AB (ref 5–15)
BUN: 94 mg/dL — AB (ref 6–23)
CO2: 22 mEq/L (ref 19–32)
Calcium: 9.6 mg/dL (ref 8.4–10.5)
Chloride: 93 mEq/L — ABNORMAL LOW (ref 96–112)
Creatinine, Ser: 2.06 mg/dL — ABNORMAL HIGH (ref 0.50–1.35)
GFR calc Af Amer: 35 mL/min — ABNORMAL LOW (ref >90)
GFR, EST NON AFRICAN AMERICAN: 30 mL/min — AB (ref >90)
Glucose, Bld: 152 mg/dL — ABNORMAL HIGH (ref 70–99)
POTASSIUM: 5 meq/L (ref 3.7–5.3)
Sodium: 135 mEq/L — ABNORMAL LOW (ref 137–147)

## 2014-04-23 LAB — CBC
HCT: 30.1 % — ABNORMAL LOW (ref 39.0–52.0)
Hemoglobin: 9.8 g/dL — ABNORMAL LOW (ref 13.0–17.0)
MCH: 31.7 pg (ref 26.0–34.0)
MCHC: 32.6 g/dL (ref 30.0–36.0)
MCV: 97.4 fL (ref 78.0–100.0)
Platelets: 137 10*3/uL — ABNORMAL LOW (ref 150–400)
RBC: 3.09 MIL/uL — ABNORMAL LOW (ref 4.22–5.81)
RDW: 20.9 % — AB (ref 11.5–15.5)
WBC: 4.6 10*3/uL (ref 4.0–10.5)

## 2014-04-23 LAB — LACTATE DEHYDROGENASE: LDH: 482 U/L — ABNORMAL HIGH (ref 94–250)

## 2014-04-23 MED ORDER — CYCLOPHOSPHAMIDE 50 MG PO CAPS
100.0000 mg | ORAL_CAPSULE | Freq: Every day | ORAL | Status: DC
  Administered 2014-04-23: 100 mg via ORAL
  Filled 2014-04-23: qty 1

## 2014-04-23 MED ORDER — SALINE SPRAY 0.65 % NA SOLN
1.0000 | NASAL | Status: DC | PRN
  Filled 2014-04-23: qty 44

## 2014-04-23 MED ORDER — CLONAZEPAM 0.5 MG PO TABS
0.2500 mg | ORAL_TABLET | Freq: Two times a day (BID) | ORAL | Status: DC
  Administered 2014-04-23 – 2014-05-07 (×29): 0.25 mg via ORAL
  Filled 2014-04-23 (×31): qty 1

## 2014-04-23 MED ORDER — CEFAZOLIN SODIUM-DEXTROSE 2-3 GM-% IV SOLR
2.0000 g | INTRAVENOUS | Status: AC
  Administered 2014-04-23: 2 g via INTRAVENOUS
  Filled 2014-04-23: qty 50

## 2014-04-23 MED ORDER — CYCLOPHOSPHAMIDE 50 MG PO TABS
100.0000 mg | ORAL_TABLET | Freq: Every day | ORAL | Status: DC
  Administered 2014-04-24 – 2014-04-25 (×2): 100 mg via ORAL
  Filled 2014-04-23 (×4): qty 2

## 2014-04-23 MED ORDER — SULFAMETHOXAZOLE-TRIMETHOPRIM 400-80 MG PO TABS
1.0000 | ORAL_TABLET | Freq: Every day | ORAL | Status: DC
  Administered 2014-04-23 – 2014-04-26 (×4): 1 via ORAL
  Filled 2014-04-23 (×4): qty 1

## 2014-04-23 MED FILL — Hepatitis B Vaccine (Recombinant) Susp 10 MCG/ML: INTRAMUSCULAR | Qty: 1 | Status: AC

## 2014-04-23 NOTE — Progress Notes (Signed)
[facility] PCCM  CT results reviewed > looks like worsening Goodpasture syndrome  Discussed with Dr. Posey Pronto, plan plasmapheresis  Eliquis held, discussed with IR  Left message for wife  Roselie Awkward, MD [place] [facility] Pager: [phone] Cell: [phone] If no response, call [phone]

## 2014-04-23 NOTE — Consult Note (Signed)
Referring Provider: No ref. provider found Primary Care Physician:  [name], MD Primary Nephrologist:  Dr. Posey Pronto   Reason for Consultation:  Acute renal failure and hypotension with Interstitial lung disease and pulmonary edema  HPI:73 year patient with underlying chronic atrial flutter, recent DVT, Goodpasture's, ( biopsy proven )and recent oxygen dependent respiratory failure after hospitalization for community acquired pneumonia last month who presented to the hospital with progressive shortness of breath and lower extremity edema. He also has a history of pulmonary embolus and IVC filter has been placed.  The question asked by the primary team is whether this represents an exacerbation of his Goodpastures. He is immunosuppressed with cytoxan. He has received plasmapheresis in  April  2015 and has had a total of fourteen plasmapheresis treatments.   Past Medical History  Diagnosis Date  . Atrial flutter started in 2011  . Chronic diastolic heart failure   . DVT   . HYPERLIPIDEMIA   . HYPERTENSION, BENIGN   . ALLERGIC ASTHMA   . Palpitations   . H/O Goodpasture's syndrome     "autoimmune disease; affects kidneys and lungs"  . DVT (deep venous thrombosis) 09/2001    "right groin"  . PE (pulmonary embolism) 09/2001  . CHF (congestive heart failure)   . Anemia   . History of blood transfusion     "related to Goodpasture's syndrome; plasmapheresis process"  . Stomach ulcer   . Arthritis     "hands, knees" (03/16/2014)  . Gout   . Renal insufficiency     "related to the Goodpasture's"  . Atrial flutter with rapid ventricular response 03/16/2014  . Acute on chronic diastolic HF (heart failure) in combination with rapid a flutter and anemia 03/18/2014  . Ejection fraction     Past Surgical History  Procedure Laterality Date  . Renal biopsy, percutaneous  12/2013  . Vena cava filter placement  09/2001    Prior to Admission medications   Medication Sig Start Date End Date  Taking? Authorizing Provider  apixaban (ELIQUIS) 5 MG TABS tablet Take 1 tablet (5 mg total) by mouth 2 (two) times daily. 04/06/14  Yes Mutaz Elmahi, MD  bimatoprost (LUMIGAN) 0.01 % SOLN Place 1 drop into both eyes every morning.   Yes Historical Provider, MD  cyanocobalamin (,VITAMIN B-12,) 1000 MCG/ML injection Inject 1,000 mcg into the muscle every 30 (thirty) days.    Yes Historical Provider, MD  cyclobenzaprine (FLEXERIL) 5 MG tablet Take 5 mg by mouth at bedtime.   Yes Historical Provider, MD  cyclophosphamide (CYTOXAN) 50 MG tablet Take 100 mg by mouth daily. Give on an empty stomach 1 hour before or 2 hours after meals.   Yes Historical Provider, MD  diltiazem (CARDIZEM CD) 240 MG 24 hr capsule Take 1 capsule (240 mg total) by mouth daily. 04/06/14  Yes Verlee Monte, MD  Dutasteride-Tamsulosin HCl 0.5-0.4 MG CAPS Take 1 capsule by mouth daily.   Yes Historical Provider, MD  furosemide (LASIX) 20 MG tablet Take 1 tablet (20 mg total) by mouth daily. 12/30/13  Yes Belkys A Regalado, MD  metoprolol (LOPRESSOR) 50 MG tablet Take 0.5 tablets (25 mg total) by mouth 2 (two) times daily. 04/16/14  Yes Jettie Booze, MD  montelukast (SINGULAIR) 10 MG tablet Take 10 mg by mouth at bedtime.  06/10/13  Yes Historical Provider, MD  omeprazole (PRILOSEC) 20 MG capsule Take 20 mg by mouth daily.  08/17/13  Yes Historical Provider, MD  predniSONE (DELTASONE) 20 MG tablet Take 3 tablets (60  mg total) by mouth daily with breakfast. 04/06/14  Yes Verlee Monte, MD  traMADol (ULTRAM) 50 MG tablet Take 1 tablet (50 mg total) by mouth every 12 (twelve) hours as needed. 12/30/13  Yes Belkys A Regalado, MD  Vitamin D, Cholecalciferol, 1000 UNITS TABS Take 2,000 Units by mouth daily.    Yes Historical Provider, MD    Current Facility-Administered Medications  Medication Dose Route Frequency Provider Last Rate Last Dose  . acetaminophen (TYLENOL) tablet 650 mg  650 mg Oral Q4H PRN Phillips Grout, MD      .  antiseptic oral rinse (BIOTENE) solution 15 mL  15 mL Mouth Rinse BID Allie Bossier, MD   15 mL at 04/23/14 0853  . apixaban (ELIQUIS) tablet 5 mg  5 mg Oral BID Phillips Grout, MD   5 mg at 04/23/14 1015  . clonazePAM (KLONOPIN) tablet 0.25 mg  0.25 mg Oral BID Cherene Altes, MD   0.25 mg at 04/23/14 1022  . [START ON 05/10/2014] cyanocobalamin ((VITAMIN B-12)) injection 1,000 mcg  1,000 mcg Intramuscular Q30 days Phillips Grout, MD      . cyclobenzaprine (FLEXERIL) tablet 5 mg  5 mg Oral QHS Phillips Grout, MD   5 mg at 04/22/14 2205  . cyclophosphamide (CYTOXAN) tablet 100 mg  100 mg Oral Daily Cherene Altes, MD      . Derrill Memo ON 04/25/2014] darbepoetin (ARANESP) injection 60 mcg  60 mcg Subcutaneous Q Wed-1800 Dareen Piano, [facility]      . tamsulosin [facility]) capsule 0.4 mg  0.4 mg Oral Daily Cherene Altes, MD   0.4 mg at 04/23/14 1015   And  . dutasteride (AVODART) capsule 0.5 mg  0.5 mg Oral Daily Cherene Altes, MD   0.5 mg at 04/23/14 1015  . furosemide (LASIX) injection 40 mg  40 mg Intravenous Daily Cherene Altes, MD   40 mg at 04/23/14 1014  . latanoprost (XALATAN) 0.005 % ophthalmic solution 1 drop  1 drop Both Eyes q morning - 10a Etta Quill, DO   1 drop at 04/23/14 1015  . levalbuterol (XOPENEX) nebulizer solution 0.63 mg  0.63 mg Nebulization Q3H PRN Cherene Altes, MD      . levalbuterol Penne Lash) nebulizer solution 1.25 mg  1.25 mg Nebulization 4 times per day Doree Fudge, MD   1.25 mg at 04/23/14 0944  . metoprolol (LOPRESSOR) injection 5 mg  5 mg Intravenous Q2H PRN Samella Parr, NP   5 mg at 04/04/2014 0354  . metoprolol tartrate (LOPRESSOR) tablet 25 mg  25 mg Oral 4 times per day Samella Parr, NP   25 mg at 04/23/14 1143  . montelukast (SINGULAIR) tablet 10 mg  10 mg Oral QHS Phillips Grout, MD   10 mg at 04/22/14 2133  . ondansetron (ZOFRAN) injection 4 mg  4 mg Intravenous Q6H PRN Phillips Grout, MD      . pantoprazole (PROTONIX) EC  tablet 40 mg  40 mg Oral Q1200 Cherene Altes, MD   40 mg at 04/23/14 1143  . predniSONE (DELTASONE) tablet 60 mg  60 mg Oral Q breakfast Cherene Altes, MD   60 mg at 04/23/14 [phone]  . sodium chloride (OCEAN) 0.65 % nasal spray 1 spray  1 spray Each Nare PRN Dianne Dun, NP      . sulfamethoxazole-trimethoprim (BACTRIM,SEPTRA) 400-80 MG per tablet 1 tablet  1 tablet Oral Daily Juanito Doom, MD  Allergies as of 04/13/2014  . (No Known Allergies)    Family History  Problem Relation Age of Onset  . Stroke Mother   . Heart attack Maternal Grandmother   . Coronary artery disease      negative history of early CAD    History   Social History  . Marital Status: Married    Spouse Name: N/A    Number of Children: N/A  . Years of Education: N/A   Occupational History  . Not on file.   Social History Main Topics  . Smoking status: Former Smoker -- 1.00 packs/day for 6 years    Types: Cigarettes    Quit date: 05/02/1967  . Smokeless tobacco: Never Used  . Alcohol Use: 8.4 oz/week    14 Glasses of wine per week     Comment: drink 1 drink a day  . Drug Use: No  . Sexual Activity: Not Currently   Other Topics Concern  . Not on file   Social History Narrative  . No narrative on file    Review of Systems: Gen: + anorexia and weaknes HEENT: No visual complaints, No history of Retinopathy. Normal external appearance No Epistaxis or Sore throat. No sinusitis.   CV: Denies chest pain, angina, palpitations, syncope, orthopnea, PND, peripheral edema, and claudication. Resp: + dyspnea at rest cough, sputum, wheezing, coughing up blood, and pleurisy. Oxygen dependent GI: Denies vomiting blood, jaundice, and fecal incontinence.   Denies dysphagia or odynophagia. GU : hematuria evaluation by Urology  MS: Denies joint pain, limitation of movement, and swelling, stiffness, low back pain, extremity pain. Denies muscle weakness, cramps, atrophy.  No use of non  steroidal antiinflammatory drugs. Derm: Denies rash, itching, dry skin, hives, moles, warts, or unhealing ulcers.  Psych: Denies depression, anxiety, memory loss, suicidal ideation, hallucinations, paranoia, and confusion. Heme: Denies bruising, bleeding, and enlarged lymph nodes. Neuro: No headache.  No diplopia. No dysarthria.  No dysphasia.  No history of CVA.  No Seizures. No paresthesias.  No weakness. Endocrine No DM.  No Thyroid disease.  No Adrenal disease.  Physical Exam: Vital signs in last 24 hours: Temp:  [97.2 F (36.2 C)-97.9 F (36.6 C)] 97.2 F (36.2 C) (07/27 1150) Pulse Rate:  [65-111] 82 (07/27 1150) Resp:  [21-28] 27 (07/27 1150) BP: (94-114)/(61-82) 106/70 mmHg (07/27 1150) SpO2:  [94 %-100 %] 97 % (07/27 1150) FiO2 (%):  [60 %] 60 % (07/27 1150) Weight:  [75.2 kg (165 lb 12.6 oz)] 75.2 kg (165 lb 12.6 oz) (07/27 0419) Last BM Date: 04/18/14 General:   Ill appearing  Head:  Normocephalic and atraumatic. Eyes:  Sclera clear, no icterus.   Conjunctiva pink. Ears:  Normal auditory acuity. Nose:  No deformity, discharge,  or lesions. Mouth:  No deformity or lesions, dentition normal. Neck:  Supple; no masses or thyromegaly. JVP not elevated Lungs:  Fine inspiratory rales Heart:  Regular rate and rhythm Abdomen:  Soft, nontender and nondistended. No masses, hepatosplenomegaly or hernias noted. Normal bowel sounds, without guarding, and without rebound.   Msk:  Symmetrical without gross deformities. Normal posture. Pulses:  No carotid, renal, femoral bruits. DP and PT symmetrical and equal Extremities:  Without clubbing or edema. Neurologic:  Alert and  oriented x4;  grossly normal neurologically. Skin:  Intact without significant lesions or rashes. Cervical Nodes:  No significant cervical adenopathy. Psych:  Alert and cooperative. Normal mood and affect.  Intake/Output from previous day: 07/26 0701 - 07/27 0700 In: 630 [P.O.:630] Out: 835  [  Urine:835] Intake/Output this shift: Total I/O In: -  Out: 75 [Urine:75]  Lab Results:  Recent Labs  04/21/14 0245 04/23/14 0020  WBC 3.6* 4.6  HGB 8.7* 9.8*  HCT 26.0* 30.1*  PLT 131* 137*   BMET  Recent Labs  04/21/14 0245 04/23/14 0020  NA 138 135*  K 4.4 5.0  CL 97 93*  CO2 25 22  GLUCOSE 144* 152*  BUN 50* 94*  CREATININE 1.29 2.06*  CALCIUM 9.4 9.6   LFT No results found for this basename: PROT, ALBUMIN, AST, ALT, ALKPHOS, BILITOT, BILIDIR, IBILI,  in the last 72 hours PT/INR No results found for this basename: LABPROT, INR,  in the last 72 hours Hepatitis Panel No results found for this basename: HEPBSAG, HCVAB, HEPAIGM, HEPBIGM,  in the last 72 hours  Studies/Results: Dg Chest 2 View  04/22/2014   CLINICAL DATA:  73 year old male with respiratory failure. Atrial flutter, Goodpasture syndrome, heart failure. Recent pneumonia.  EXAM: CHEST  2 VIEW  COMPARISON:  04/21/2014 and prior chest radiographs  FINDINGS: Cardiomegaly again noted.  Diffuse airspace and interstitial opacities are again noted. Increasing opacity within the left lower lung noted.  There is no evidence of pneumothorax or large pleural effusion.  No acute bony abnormalities are present.  IMPRESSION: Diffuse interstitial and airspace opacities again noted with increasing opacity within the left lower lung. No other significant change.   Electronically Signed   By: Hassan Rowan M.D.   On: 04/22/2014 17:20   Ct Chest High Resolution  04/23/2014   CLINICAL DATA:  Goodpasture's syndrome with worsening hypoxemic respiratory failure.  EXAM: CT CHEST WITHOUT CONTRAST  TECHNIQUE: Multidetector CT imaging of the chest was performed following the standard protocol without intravenous contrast. High resolution imaging of the lungs, as well as inspiratory and expiratory imaging, was performed.  COMPARISON:  03/30/2014.  FINDINGS: Low-attenuation lesions in the thyroid measure up to 1.6 cm on the right. Mediastinal  lymph nodes are not enlarged by CT size criteria. Hilar regions are difficult to definitively evaluate without IV contrast but appear grossly unremarkable. No axillary adenopathy. Pulmonary arteries are enlarged. Atherosclerotic calcification of the arterial vasculature, including three-vessel involvement of the coronary arteries. Heart is mildly enlarged. No pericardial effusion.  Image quality is degraded by respiratory motion. Reticular opacities are seen with a slight basilar predominance and some areas of subpleural sparing. Given the craniocaudal gradient, there is slight relative sparing of the costophrenic angles as well. Traction bronchiectasis/bronchiolectasis and architectural does are seen in association. When compared with 03/30/2014, findings appear progressive with fairly diffuse superimposed ground-glass. No definite air trapping. No pleural fluid. Airway is unremarkable.  Incidental imaging of the upper abdomen shows the visualized portions of the liver, gallbladder, adrenal glands, right kidney, spleen, pancreas, stomach and bowel to be grossly unremarkable. No upper abdominal adenopathy. No worrisome lytic or sclerotic lesions. Degenerative changes are seen in the spine.  IMPRESSION: 1. 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 Superimposed ground-glass is indicative of pulmonary hemorrhage. 2. Enlarged pulmonary arteries, indicative of pulmonary arterial hypertension. 3. Three-vessel coronary artery calcification. 4. Low-attenuation lesions in the thyroid. Consider further evaluation with thyroid ultrasound. If patient is clinically hyperthyroid, consider nuclear medicine thyroid uptake and scan.   Electronically Signed   By: Lorin Picket  M.D.   On: 04/23/2014 12:04  Assessment/Plan: This is a very unfortunate man with multiple reasons to be dyspneic. Congestive heart failure, pulmonary edema and interstitial lung disease and possible pneumonia. He has been immunosuppressed since his initial diagnosis of Goodpastures and has had consistently negative serologies. I have discussed the case with Dr Posey Pronto who has been closely monitoring the patient for progression. His renal function has been stable although he has had persistent hematuria.   The acute renal failure appears related to a drop in blood pressure and hypoperfusion of his kidneys  We shall plan on checking an anti GBM and will check renal function   LOS: 6 Adali Pennings W @TODAY @12 :19 PM

## 2014-04-23 NOTE — Progress Notes (Signed)
Placed pt on heated high flow [place] with no complications. Pt tol well

## 2014-04-23 NOTE — Consult Note (Signed)
HPI: Bryan Hubbard is an 73 y.o. male with prior hx of Goodpasture's syndrome, as well as a.flutter, CKD. Pt has been admitted with progressive SOB. His workup is suggestive of worsening Goodpastures and it is recommended he have plasmapheresis. He has had this earlier this and had a catheter placed and is familiar with it. Chart, PMHx, labs, meds reviewed. Pt on chronic bid Eliquis and received dose this am.  Past Medical History:  Past Medical History  Diagnosis Date  . Atrial flutter started in 2011  . Chronic diastolic heart failure   . DVT   . HYPERLIPIDEMIA   . HYPERTENSION, BENIGN   . ALLERGIC ASTHMA   . Palpitations   . H/O Goodpasture's syndrome     "autoimmune disease; affects kidneys and lungs"  . DVT (deep venous thrombosis) 09/2001    "right groin"  . PE (pulmonary embolism) 09/2001  . CHF (congestive heart failure)   . Anemia   . History of blood transfusion     "related to Goodpasture's syndrome; plasmapheresis process"  . Stomach ulcer   . Arthritis     "hands, knees" (03/16/2014)  . Gout   . Renal insufficiency     "related to the Goodpasture's"  . Atrial flutter with rapid ventricular response 03/16/2014  . Acute on chronic diastolic HF (heart failure) in combination with rapid a flutter and anemia 03/18/2014  . Ejection fraction     Surgical History:  Past Surgical History  Procedure Laterality Date  . Renal biopsy, percutaneous  12/2013  . Vena cava filter placement  09/2001    Family History:  Family History  Problem Relation Age of Onset  . Stroke Mother   . Heart attack Maternal Grandmother   . Coronary artery disease      negative history of early CAD    Social History:  reports that he quit smoking about 47 years ago. His smoking use included Cigarettes. He has a 6 pack-year smoking history. He has never used smokeless tobacco. He reports that he drinks about 8.4 ounces of alcohol per week. He reports that he does not use illicit  drugs.  Allergies: No Known Allergies  Medications: Current facility-administered medications:acetaminophen (TYLENOL) tablet 650 mg, 650 mg, Oral, Q4H PRN, Phillips Grout, MD;  antiseptic oral rinse (BIOTENE) solution 15 mL, 15 mL, Mouth Rinse, BID, Allie Bossier, MD, 15 mL at 04/23/14 0932;  clonazePAM (KLONOPIN) tablet 0.25 mg, 0.25 mg, Oral, BID, Cherene Altes, MD, 0.25 mg at 04/23/14 1022 [START ON 05/10/2014] cyanocobalamin ((VITAMIN B-12)) injection 1,000 mcg, 1,000 mcg, Intramuscular, Q30 days, Phillips Grout, MD;  cyclobenzaprine (FLEXERIL) tablet 5 mg, 5 mg, Oral, QHS, Phillips Grout, MD, 5 mg at 04/22/14 2205;  cyclophosphamide (CYTOXAN) tablet 100 mg, 100 mg, Oral, Daily, Cherene Altes, MD;  Derrill Memo ON 04/25/2014] darbepoetin (ARANESP) injection 60 mcg, 60 mcg, Subcutaneous, Q Wed-1800, Dareen Piano, RPH dutasteride (AVODART) capsule 0.5 mg, 0.5 mg, Oral, Daily, Cherene Altes, MD, 0.5 mg at 04/23/14 1015;  furosemide (LASIX) injection 40 mg, 40 mg, Intravenous, Daily, Cherene Altes, MD, 40 mg at 04/23/14 1014;  latanoprost (XALATAN) 0.005 % ophthalmic solution 1 drop, 1 drop, Both Eyes, q morning - 10a, Jared M Gardner, DO, 1 drop at 04/23/14 1015 levalbuterol (XOPENEX) nebulizer solution 0.63 mg, 0.63 mg, Nebulization, Q3H PRN, Cherene Altes, MD;  levalbuterol Penne Lash) nebulizer solution 1.25 mg, 1.25 mg, Nebulization, 4 times per day, Doree Fudge, MD, 1.25 mg at 04/23/14 1503;  metoprolol (LOPRESSOR) injection 5 mg, 5 mg, Intravenous, Q2H PRN, Samella Parr, NP, 5 mg at 04/24/2014 0354 metoprolol tartrate (LOPRESSOR) tablet 25 mg, 25 mg, Oral, 4 times per day, Samella Parr, NP, 25 mg at 04/23/14 1143;  montelukast (SINGULAIR) tablet 10 mg, 10 mg, Oral, QHS, Phillips Grout, MD, 10 mg at 04/22/14 2133;  ondansetron (ZOFRAN) injection 4 mg, 4 mg, Intravenous, Q6H PRN, Phillips Grout, MD;  pantoprazole (PROTONIX) EC tablet 40 mg, 40 mg, Oral, Q1200, Cherene Altes, MD, 40 mg at 04/23/14 1143 predniSONE (DELTASONE) tablet 60 mg, 60 mg, Oral, Q breakfast, Cherene Altes, MD, 60 mg at 04/23/14 6195;  sodium chloride (OCEAN) 0.65 % nasal spray 1 spray, 1 spray, Each Nare, PRN, Dianne Dun, NP;  sulfamethoxazole-trimethoprim (BACTRIM,SEPTRA) 400-80 MG per tablet 1 tablet, 1 tablet, Oral, Daily, Juanito Doom, MD, 1 tablet at 04/23/14 1335 tamsulosin (FLOMAX) capsule 0.4 mg, 0.4 mg, Oral, Daily, Cherene Altes, MD, 0.4 mg at 04/23/14 1015  ROS: See HPI for pertinent findings, otherwise complete 10 system review negative.  Physical Exam: Blood pressure 106/70, pulse 78, temperature 97.2 F (36.2 C), temperature source Oral, resp. rate 27, height _0  (1.778 m), weight 165 lb 12.6 oz (75.2 kg), SpO2 94.00%. ENT: unremarkable airway Lungs: sl decreased at bases Heart: Reg    Labs: CBC  Recent Labs  04/21/14 0245 04/23/14 0020  WBC 3.6* 4.6  HGB 8.7* 9.8*  HCT 26.0* 30.1*  PLT 131* 137*   MET  Recent Labs  04/21/14 0245 04/23/14 0020  NA 138 135*  K 4.4 5.0  CL 97 93*  CO2 25 22  GLUCOSE 144* 152*  BUN 50* 94*  CREATININE 1.29 2.06*  CALCIUM 9.4 9.6     Dg Chest 2 View  04/22/2014   CLINICAL DATA:  73 year old male with respiratory failure. Atrial flutter, Goodpasture syndrome, heart failure. Recent pneumonia.  EXAM: CHEST  2 VIEW  COMPARISON:  04/03/2014 and prior chest radiographs  FINDINGS: Cardiomegaly again noted.  Diffuse airspace and interstitial opacities are again noted. Increasing opacity within the left lower lung noted.  There is no evidence of pneumothorax or large pleural effusion.  No acute bony abnormalities are present.  IMPRESSION: Diffuse interstitial and airspace opacities again noted with increasing opacity within the left lower lung. No other significant change.   Electronically Signed   By: Hassan Rowan M.D.   On: 04/22/2014 17:20   Ct Chest High Resolution  04/23/2014   CLINICAL DATA:   Goodpasture's syndrome with worsening hypoxemic respiratory failure.  EXAM: CT CHEST WITHOUT CONTRAST  TECHNIQUE: Multidetector CT imaging of the chest was performed following the standard protocol without intravenous contrast. High resolution imaging of the lungs, as well as inspiratory and expiratory imaging, was performed.  COMPARISON:  03/30/2014.  FINDINGS: Low-attenuation lesions in the thyroid measure up to 1.6 cm on the right. Mediastinal lymph nodes are not enlarged by CT size criteria. Hilar regions are difficult to definitively evaluate without IV contrast but appear grossly unremarkable. No axillary adenopathy. Pulmonary arteries are enlarged. Atherosclerotic calcification of the arterial vasculature, including three-vessel involvement of the coronary arteries. Heart is mildly enlarged. No pericardial effusion.  Image quality is degraded by respiratory motion. Reticular opacities are seen with a slight basilar predominance and some areas of subpleural sparing. Given the craniocaudal gradient, there is slight relative sparing of the costophrenic angles as well. Traction bronchiectasis/bronchiolectasis and architectural does are seen in association. When compared with  03/30/2014, findings appear progressive with fairly diffuse superimposed ground-glass. No definite air trapping. No pleural fluid. Airway is unremarkable.  Incidental imaging of the upper abdomen shows the visualized portions of the liver, gallbladder, adrenal glands, right kidney, spleen, pancreas, stomach and bowel to be grossly unremarkable. No upper abdominal adenopathy. No worrisome lytic or sclerotic lesions. Degenerative changes are seen in the spine.  IMPRESSION: 1. Pulmonary parenchymal pattern of reticular opacities, traction bronchiectasis/ bronchiolectasis and architectural distortion, as described above, appears progressive in the short interval from 03/30/2014, and is worrisome for a fulminant course of pulmonary fibrosis  secondary to Goodpasture syndrome. Superimposed ground-glass is indicative of pulmonary hemorrhage. 2. Enlarged pulmonary arteries, indicative of pulmonary arterial hypertension. 3. Three-vessel coronary artery calcification. 4. Low-attenuation lesions in the thyroid. Consider further evaluation with thyroid ultrasound. If patient is clinically hyperthyroid, consider nuclear medicine thyroid uptake and scan.   Electronically Signed   By: Lorin Picket M.D.   On: 04/23/2014 12:04    Assessment/Plan:: Progressive SOB secondary to worsening Goodpastures syndrome Plan for placement of plasmapheresis catheter. Pt received Eliquis this am, our normal guidelines are for this to be held 48hrs before procedure, though pheresis expected to start tomorrow. May have to place non-tunneled pheresis catheter. Performing IR MD will decide tomorrow. NPO p MN Consent signed by pt in chart  Ascencion Dike PA-C 04/23/2014, 4:17 PM

## 2014-04-23 NOTE — Progress Notes (Signed)
PULMONARY / CRITICAL CARE MEDICINE  Name: Bryan Hubbard MRN: [id number] DOB: January 16, 1941    ADMISSION DATE:  03/31/2014 CONSULTATION DATE:  03/31/2014  REFERRING MD :  Thereasa Solo  PRIMARY SERVICE:  TRH  CHIEF COMPLAINT:  Hypoxemic respiratory failure  BRIEF PATIENT DESCRIPTION: [name] with PMH of Goodpastures - O2 dependent, presents with worsening SOB, dizziness, weakness and LE swelling.  Cards consulted and did not feel that SOB was due to a primary cardiac reason.  PCCM was consulted for further evaluation.  SIGNIFICANT EVENTS / STUDIES:  3/27 TTE >>> PAP 45 torr 7/3   Chest CT >>> bilateral interstitial lung abnormality without nodules or consolidation, no effusion  7/7   VQ scan >>> intermediate probability of PE, new acute perfusion defect LUL since prior VQ in 12/2013  7/7   PFT >>> restrictive pattern, severe diffusion impairment, significant bronchodilator responce  7/8   Venous duplex >>> no clot  7/24 DC cardioversion  LINES / TUBES:  CULTURES: Urine 7/22 >> coag neg staph (likely contaminant) Blood 7/22 >>   ANTIBIOTICS:  INTERVAL HISTORY:   Says he feels a little better since his cardioversion, sill needs 100% NRB mask  VITAL SIGNS: Temp:  [97.3 F (36.3 C)-97.9 F (36.6 C)] 97.6 F (36.4 C) (07/27 0817) Pulse Rate:  [65-111] 65 (07/27 0817) Resp:  [21-28] 24 (07/27 0817) BP: (94-125)/(61-82) 114/82 mmHg (07/27 0817) SpO2:  [95 %-100 %] 100 % (07/27 0957) Weight:  [75.2 kg (165 lb 12.6 oz)] 75.2 kg (165 lb 12.6 oz) (07/27 0419)  HEMODYNAMICS:   VENTILATOR SETTINGS:    INTAKE / OUTPUT: Intake/Output     07/26 0701 - 07/27 0700 07/27 0701 - 07/28 0700   P.O. 630    Total Intake(mL/kg) 630 (8.4)    Urine (mL/kg/hr) 835 (0.5)    Stool     Total Output 835     Net -205          Urine Occurrence 1 x    Stool Occurrence 1 x     PHYSICAL EXAMINATION: General: no acute distress HEENT: NCAT, OP clear PULM: crackles in bases bilaterally CV: RRR, no  mgr, no JVD AB: BS+, soft, nontender Ex: edema R leg > L Neuro: A&Ox4, MAEW  LABS: I have reviewed all of today's lab results. Relevant abnormalities are discussed in the A/P section  CXR: NNF ASSESSMENT / PLAN:  Acute on chronic hypoxemic respiratory failure > not improving as we would like.  Etiology: presumably due to PE, pulmonary edema and underlying diffuse parenchymal lung disease (Goodpasture syndrome); overall I am concerned that Goodpasture is back.  His CT from 7/3 was not too impressive so I would like to repeat an ILD protocol study considering his worsening hypoxemia. I don't think that this is only due to pulmonary edema.  If CT chest not that impressive then he may benefit from a shunt study and RHC.  Doubt PCP given lack of fever but will start bactrim prophylaxis. CT chest high resolution today High flow O2 Start bactrim for PCP prophylaxis Continue prednisone, cytoxan Cont Eliquis Send LDH  AFRVR > SR s/p DCCV, Cards managing Possible component of edema Chronic diastolic heart failure Mgmt per Cards  Wife updated at length at bedside  Roselie Awkward, MD [facility] Pager: [phone] Cell: [phone] If no response, call [phone]   04/23/2014, 10:05 AM

## 2014-04-23 NOTE — Progress Notes (Signed)
Bryan Hubbard - Stepdown / ICU Progress Note  Bryan Hubbard VQX:[id number] DOB: 1940-12-18 DOA: 04/15/2014 PCP: [name], MD  Brief narrative: 48 year patient with underlying chronic atrial flutter, recent DVT, Goodpasture's, and recent oxygen dependent respiratory failure after hospitalization for community acquired pneumonia last month who presented to the hospital with progressive shortness of breath and lower extremity edema. In addition he was becoming very dizzy and very weak with standing. He notified his cardiologist of these symptoms several days prior to presentation and was instructed to decrease his metoprolol to 25 mg by mouth twice a day. Unfortunately his symptoms did not improve.  He presented to the hospital where it was noted that his systolic blood pressure was in the upper 80s and upon standing dropped into the 70s. Review of patient medication revealed he was taking Lasix 20 mg daily and had continued to take this medication prior to presentation. In addition he endorsed having to increase his oxygen at home from 2 L to 4 L because of progressive shortness of breath. A two-view chest x-ray demonstrated acutely worsened diffuse bilateral airspace opacification with a peripheral distribution and diffuse increased interstitial markings that could either be acute infectious or inflammatory interstitial pneumonitis.   HPI/Subjective: Alert, and endorsing some mild anxiety related to ongoing breathing issues. Denies chest pain or awareness of tachypalpitations  Assessment/Plan:  Atrial flutter with rapid ventricular response -as per Cardiology  -failed attempts to use cardizem, BB, or both to control RVR -Ibutilide infusion unsuccessful > DCCV successful under bedside anesthesia -maintaining NSR at this time although notes occasional runs of SVT less than 12-14 beats per minute as well as recurrent PVCs occasionally coupled  Goodpasture's -chronically treated with  steroid, oral cytoxan and prn plasmapheresis  -unclear if current respiratory symptoms are a manifestation of Goodpasture's or primarily related to atrial flutter with RVR -per Pulmonary chest x-ray is consistent with interstitial lung disease of uncertain etiology - due to current respiratory compromise unable to proceed with biopsy to help clarify diagnosis -pulse steroids at higher dose for several days  -continue preadmission immunosuppressants -Do to lack of response to diuretics and worsening renal status have asked nephrology to evaluate the patient regarding his Goodpasture's related renal disease  Acute on chronic respiratory failure with hypoxia   A) Acute reate related diastolic congestive heart failure exacerbation    B) Pulmonary Goodpasture's  -See above -attempted to gently diurese but unfortunately have only worsened renal function without any improvement in respiratory status   Glomerulonephritis / Chronic kidney disease stage 3, GFR 30-59 ml/min -Appears stable at this time with appropriate urinary output   Hypoxemic mediated anxiety -Add Klonopin  Abnormal UA -cx positive only for 20,000 colonies of coag neg staph - further tx not currently indicated   Hematuria (recurrent) -pt states this is a recurring problem which has improve w/ his use of Jalyn - BP improved so was resumed 7/26 - urine clearing today 7/27  Anemia of chronic disease -Hemoglobin stable - baseline between 7.9 and 9   Thrombocytopenia -follow closely - resolving slowly and greater than 100,000   HYPERLIPIDEMIA  HYPERTENSION -BP well controlled  History of DVT / PE / IVC filter -was transitioned from warfarin to eliquis during the last hospitalization at recommendation of the Pulmonologist in setting of inability to maintain therapeutic INR on warfarin  DVT prophylaxis: Eliquis Code Status: Full Family Communication: spoke w/ pt and wife at length  Disposition Plan/Expected LOS: Step  down  Consultants:  Cardiology Pulmonology Nephrology   Procedures: 7/24 - bedside DCCV  Positive Cultures: Urine culture coag neg staph  Antibiotics: None  Objective: Blood pressure 106/70, pulse 82, temperature 97.2 F (36.2 C), temperature source Oral, resp. rate 27, height 5\' 10"  (Hubbard.778 m), weight 165 lb 12.6 oz (75.2 kg), SpO2 97.00%.  Intake/Output Summary (Last 24 hours) at 04/23/14 1306 Last data filed at 04/23/14 0930  Gross per 24 hour  Intake    300 ml  Output    400 ml  Net   -100 ml   Exam: General: Mild acute respiratory distress w/o signif change  Lungs: Bilateral crackles diffusely - no wheeze - remains on partial rebreather mask Cardiovascular: regular rate and rhythm presently w/o gallup or rub - occasional PVCs  Abdomen: Nontender, nondistended, soft, bowel sounds positive, no rebound, no ascites, no appreciable mass Musculoskeletal: No significant cyanosis, clubbing of bilateral lower extremities - trace B LE edema   Scheduled Meds:  Scheduled Meds: . antiseptic oral rinse  15 mL Mouth Rinse BID  . apixaban  5 mg Oral BID  . clonazePAM  0.25 mg Oral BID  . [START ON 05/10/2014] cyanocobalamin  Hubbard,000 mcg Intramuscular Q30 days  . cyclobenzaprine  5 mg Oral QHS  . cyclophosphamide  100 mg Oral Daily  . [START ON 04/25/2014] darbepoetin (ARANESP) injection - NON-DIALYSIS  60 mcg Subcutaneous Q Wed-1800  . tamsulosin  0.4 mg Oral Daily   And  . dutasteride  0.5 mg Oral Daily  . furosemide  40 mg Intravenous Daily  . latanoprost  Hubbard drop Both Eyes q morning - 10a  . levalbuterol  Hubbard.25 mg Nebulization 4 times per day  . metoprolol tartrate  25 mg Oral 4 times per day  . montelukast  10 mg Oral QHS  . pantoprazole  40 mg Oral Q1200  . predniSONE  60 mg Oral Q breakfast  . sulfamethoxazole-trimethoprim  Hubbard tablet Oral Daily   Data Reviewed: Basic Metabolic Panel:  Recent Labs Lab 04/18/14 0350 04/19/14 0215 04/24/2014 0230 04/21/2014 1140  04/21/14 0245 04/23/14 0020  NA 132* 134* 135*  --  138 135*  K 4.4 3.8 3.9  --  4.4 5.0  CL 92* 95* 95*  --  97 93*  CO2 28 24 23   --  25 22  GLUCOSE 88 159* 228*  --  144* 152*  BUN 32* 37* 43*  --  50* 94*  CREATININE Hubbard.55* Hubbard.33 Hubbard.28  --  Hubbard.29 2.06*  CALCIUM 8.7 9.0 9.2  --  9.4 9.6  MG  --   --   --  2.2  --   --    Liver Function Tests:  Recent Labs Lab 04/03/2014 2027  AST 37  ALT 38  ALKPHOS 61  BILITOT Hubbard.5*  PROT 5.8*  ALBUMIN 2.6*   CBC:  Recent Labs Lab 04/21/2014 2027 04/19/14 0215 04/11/2014 0230 04/21/14 0245 04/23/14 0020  WBC 5.2 5.Hubbard 5.2 3.6* 4.6  NEUTROABS 4.9  --   --   --   --   HGB 10.5* 9.3* 8.8* 8.7* 9.8*  HCT 31.7* 27.2* 26.4* 26.0* 30.Hubbard*  MCV 97.5 95.Hubbard 96.4 97.0 97.4  PLT 167 127* 147* 131* 137*   Cardiac Enzymes:  Recent Labs Lab 04/18/14 0350 04/18/14 0945 04/18/14 1500  TROPONINI <0.30 <0.30 <0.30   BNP (last 3 results)  Recent Labs  03/05/14 0938 03/30/14 2015 03/29/2014 2027  PROBNP 573.0* 7170.0* 6768.0*   Studies:  Recent x-ray studies have been reviewed  in detail by the Attending Physician  Time spent :  32mins  Erin Hearing, ANP [facility] For Consults/Admissions - Flow Manager - [phone] Office  [phone] Pager [phone]  On-Call/Text Page:      [email or web address]      password [email or web address]  04/23/2014, Hubbard:06 PM   LOS: 6 days   I have personally examined this patient and reviewed the entire database. I have reviewed the above note, made any necessary editorial changes, and agree with its content.  Cherene Altes, MD [facility]

## 2014-04-23 NOTE — Progress Notes (Addendum)
Patient ID: Bryan Hubbard, male   DOB: 02-Aug-1941, 73 y.o.   MRN: [id number]    Subjective:  Ongoing dyspnea, creatinine worse today.  Rhythm remains NSR.    Objective:  Vital Signs in the last 24 hours: Temp:  [97.2 F (36.2 C)-97.9 F (36.6 C)] 97.2 F (36.2 C) (07/27 1150) Pulse Rate:  [65-111] 82 (07/27 1150) Resp:  [21-28] 27 (07/27 1150) BP: (94-114)/(61-82) 106/70 mmHg (07/27 1150) SpO2:  [94 %-100 %] 97 % (07/27 1150) FiO2 (%):  [60 %] 60 % (07/27 1150) Weight:  [165 lb 12.6 oz (75.2 kg)] 165 lb 12.6 oz (75.2 kg) (07/27 0419)  Intake/Output from previous day: 07/26 0701 - 07/27 0700 In: 630 [P.O.:630] Out: 835 [Urine:835]   Physical Exam: General: Well developed, well nourished, in mild respiratory distress. Head:  Normocephalic and atraumatic. Neck: No JVD Lungs: Crackles dependently Heart: RRR, no murmur Abdomen: soft, non-tender, not distended Extremities: 1+ ankle edema Neurologic: Alert and oriented x 3.    Lab Results:  Recent Labs  04/21/14 0245 04/23/14 0020  WBC 3.6* 4.6  HGB 8.7* 9.8*  PLT 131* 137*    Recent Labs  04/21/14 0245 04/23/14 0020  NA 138 135*  K 4.4 5.0  CL 97 93*  CO2 25 22  GLUCOSE 144* 152*  BUN 50* 94*  CREATININE 1.29 2.06*   No results found for this basename: TROPONINI, CK, MB,  in the last 72 hours  Telemetry: sinus   Cardiac Studies:  EF 50-55% mild pulmonary hypertension, 35 mmHg  . antiseptic oral rinse  15 mL Mouth Rinse BID  . apixaban  5 mg Oral BID  . clonazePAM  0.25 mg Oral BID  . [START ON 05/10/2014] cyanocobalamin  1,000 mcg Intramuscular Q30 days  . cyclobenzaprine  5 mg Oral QHS  . cyclophosphamide  100 mg Oral Daily  . [START ON 04/25/2014] darbepoetin (ARANESP) injection - NON-DIALYSIS  60 mcg Subcutaneous Q Wed-1800  . tamsulosin  0.4 mg Oral Daily   And  . dutasteride  0.5 mg Oral Daily  . furosemide  40 mg Intravenous Daily  . latanoprost  1 drop Both Eyes q morning - 10a  .  levalbuterol  1.25 mg Nebulization 4 times per day  . metoprolol tartrate  25 mg Oral 4 times per day  . montelukast  10 mg Oral QHS  . pantoprazole  40 mg Oral Q1200  . predniSONE  60 mg Oral Q breakfast  . sulfamethoxazole-trimethoprim  1 tablet Oral Daily   Assessment/Plan:   73 year old male with Goodpasture syndrome, paroxysmal atrial flutter, hypoxemic respiratory failure, acute on chronic kidney injury, h/o PE/DVT.   1. Atrial flutter: Atypical flutter now s/p DCCV, remains in NSR.  Continue metoprolol and Eliquis.  He has h/o Goodpastures syndrome but no active pulmonary hemorrhage present.  2. Hypoxic respiratory failure: May be due to recent PE as well as parenchymal lung disease from Goodpastures. Pulmonary edema thought to play less of a role.  Continue prednisone for Goodpastures and Eliquis for PE.  Would hold off on aggressive diuresis with rise in creatinine could consider RHC if no improvement.  3. PE: Currently on Eliquis, IVC filter in place.  Tolerating Eliquis with no evidence for pulmonary hemorrhage.  4. AKI on CKD: Would avoid aggressive diuresis at this time.  5. Acute on chronic diastolic CHF: EF 83-41% on last echo.  Does not appear markedly volume overloaded, would avoid diuresing aggressively as I am not sure that pulmonary edema  is playing hte major role here.  May eventually need RHC.   Loralie Champagne 04/23/2014, 1:39 PM

## 2014-04-24 ENCOUNTER — Encounter (HOSPITAL_COMMUNITY): Payer: Self-pay | Admitting: Cardiology

## 2014-04-24 ENCOUNTER — Inpatient Hospital Stay (HOSPITAL_COMMUNITY): Payer: Medicare Other

## 2014-04-24 DIAGNOSIS — D696 Thrombocytopenia, unspecified: Secondary | ICD-10-CM

## 2014-04-24 DIAGNOSIS — R319 Hematuria, unspecified: Secondary | ICD-10-CM

## 2014-04-24 DIAGNOSIS — Z86711 Personal history of pulmonary embolism: Secondary | ICD-10-CM

## 2014-04-24 DIAGNOSIS — Z86718 Personal history of other venous thrombosis and embolism: Secondary | ICD-10-CM

## 2014-04-24 LAB — CBC
HCT: 30.6 % — ABNORMAL LOW (ref 39.0–52.0)
Hemoglobin: 10 g/dL — ABNORMAL LOW (ref 13.0–17.0)
MCH: 32.2 pg (ref 26.0–34.0)
MCHC: 32.7 g/dL (ref 30.0–36.0)
MCV: 98.4 fL (ref 78.0–100.0)
PLATELETS: 156 10*3/uL (ref 150–400)
RBC: 3.11 MIL/uL — ABNORMAL LOW (ref 4.22–5.81)
RDW: 20.5 % — AB (ref 11.5–15.5)
WBC: 5.1 10*3/uL (ref 4.0–10.5)

## 2014-04-24 LAB — BASIC METABOLIC PANEL
ANION GAP: 21 — AB (ref 5–15)
ANION GAP: 25 — AB (ref 5–15)
BUN: 127 mg/dL — ABNORMAL HIGH (ref 6–23)
BUN: 135 mg/dL — ABNORMAL HIGH (ref 6–23)
CHLORIDE: 90 meq/L — AB (ref 96–112)
CO2: 16 mEq/L — ABNORMAL LOW (ref 19–32)
CO2: 22 mEq/L (ref 19–32)
CREATININE: 2.47 mg/dL — AB (ref 0.50–1.35)
Calcium: 9.4 mg/dL (ref 8.4–10.5)
Calcium: 9.1 mg/dL (ref 8.4–10.5)
Chloride: 90 mEq/L — ABNORMAL LOW (ref 96–112)
Creatinine, Ser: 2.56 mg/dL — ABNORMAL HIGH (ref 0.50–1.35)
GFR calc Af Amer: 27 mL/min — ABNORMAL LOW (ref >90)
GFR calc non Af Amer: 24 mL/min — ABNORMAL LOW (ref >90)
GFR, EST AFRICAN AMERICAN: 28 mL/min — AB (ref >90)
GFR, EST NON AFRICAN AMERICAN: 23 mL/min — AB (ref >90)
Glucose, Bld: 170 mg/dL — ABNORMAL HIGH (ref 70–99)
Glucose, Bld: 136 mg/dL — ABNORMAL HIGH (ref 70–99)
POTASSIUM: 5.1 meq/L (ref 3.7–5.3)
POTASSIUM: 5.8 meq/L — AB (ref 3.7–5.3)
SODIUM: 131 meq/L — AB (ref 137–147)
Sodium: 133 mEq/L — ABNORMAL LOW (ref 137–147)

## 2014-04-24 LAB — GLOMERULAR BASEMENT MEMBRANE ANTIBODIES: GBM Ab: <1

## 2014-04-24 LAB — CULTURE, BLOOD (ROUTINE X 2)
CULTURE: NO GROWTH
Culture: NO GROWTH

## 2014-04-24 LAB — GLUCOSE, CAPILLARY: GLUCOSE-CAPILLARY: 101 mg/dL — AB (ref 70–99)

## 2014-04-24 MED ORDER — SODIUM CHLORIDE 0.9 % IV BOLUS (SEPSIS)
500.0000 mL | Freq: Once | INTRAVENOUS | Status: AC
  Administered 2014-04-24: 500 mL via INTRAVENOUS

## 2014-04-24 MED ORDER — CALCIUM CARBONATE ANTACID 500 MG PO CHEW
2.0000 | CHEWABLE_TABLET | ORAL | Status: AC
  Administered 2014-04-24: 400 mg via ORAL
  Filled 2014-04-24 (×2): qty 2

## 2014-04-24 MED ORDER — ACD FORMULA A 0.73-2.45-2.2 GM/100ML VI SOLN
Status: AC
  Administered 2014-04-24: 500 mL via INTRAVENOUS
  Filled 2014-04-24: qty 500

## 2014-04-24 MED ORDER — HYDROCODONE-ACETAMINOPHEN 5-325 MG PO TABS
1.0000 | ORAL_TABLET | ORAL | Status: AC | PRN
  Administered 2014-04-25 (×3): 1 via ORAL
  Filled 2014-04-24 (×3): qty 1

## 2014-04-24 MED ORDER — HEPARIN SODIUM (PORCINE) 1000 UNIT/ML IJ SOLN
1000.0000 [IU] | Freq: Once | INTRAMUSCULAR | Status: AC
  Administered 2014-04-24: 1000 [IU]

## 2014-04-24 MED ORDER — SODIUM CHLORIDE 0.9 % IV SOLN
INTRAVENOUS | Status: AC
  Administered 2014-04-24 (×4): via INTRAVENOUS_CENTRAL
  Filled 2014-04-24 (×4): qty 200

## 2014-04-24 MED ORDER — SODIUM CHLORIDE 0.9 % IV SOLN
4.0000 g | Freq: Once | INTRAVENOUS | Status: AC
  Administered 2014-04-24: 4 g via INTRAVENOUS
  Filled 2014-04-24: qty 40

## 2014-04-24 MED ORDER — ACETAMINOPHEN 325 MG PO TABS
650.0000 mg | ORAL_TABLET | ORAL | Status: DC | PRN
Start: 2014-04-24 — End: 2014-04-25
  Administered 2014-04-24: 650 mg via ORAL
  Filled 2014-04-24: qty 2

## 2014-04-24 MED ORDER — HEPARIN SODIUM (PORCINE) 1000 UNIT/ML IJ SOLN: 1000.0000 [IU] | Freq: Once | INTRAMUSCULAR | Status: DC

## 2014-04-24 MED ORDER — SODIUM CHLORIDE 0.9 % IV SOLN: Freq: Once | INTRAVENOUS | Status: DC

## 2014-04-24 MED ORDER — SODIUM CHLORIDE 0.9 % IV SOLN
INTRAVENOUS | Status: DC
  Administered 2014-04-24: 50 mL/h via INTRAVENOUS

## 2014-04-24 MED ORDER — SODIUM BICARBONATE 8.4 % IV SOLN
INTRAVENOUS | Status: DC
  Administered 2014-04-24 – 2014-04-26 (×4): via INTRAVENOUS
  Filled 2014-04-24 (×6): qty 150

## 2014-04-24 MED ORDER — ACD FORMULA A 0.73-2.45-2.2 GM/100ML VI SOLN
500.0000 mL | Status: DC
  Administered 2014-04-24: 500 mL via INTRAVENOUS
  Filled 2014-04-24: qty 500

## 2014-04-24 MED ORDER — HEPARIN SODIUM (PORCINE) 1000 UNIT/ML IJ SOLN
INTRAMUSCULAR | Status: AC
  Filled 2014-04-24: qty 1

## 2014-04-24 MED ORDER — CALCIUM CARBONATE ANTACID 500 MG PO CHEW: 2.0000 | CHEWABLE_TABLET | ORAL | Status: DC

## 2014-04-24 MED ORDER — DIPHENHYDRAMINE HCL 25 MG PO CAPS: 25.0000 mg | ORAL_CAPSULE | Freq: Four times a day (QID) | ORAL | Status: DC | PRN

## 2014-04-24 MED ORDER — CALCIUM CARBONATE ANTACID 500 MG PO CHEW
CHEWABLE_TABLET | ORAL | Status: AC
  Administered 2014-04-24: 400 mg via ORAL
  Filled 2014-04-24: qty 2

## 2014-04-24 NOTE — Progress Notes (Signed)
[facility] ROUNDING NOTE   Subjective:   Interval History: pale and dyspneic. Serology AntiGBM sent and in progress   Objective:  Vital signs in last 24 hours:  Temp:  [97.2 F (36.2 C)-97.9 F (36.6 C)] 97.5 F (36.4 C) (07/28 0700) Pulse Rate:  [61-85] 63 (07/28 0800) Resp:  [15-27] 15 (07/28 0800) BP: (97-119)/(63-71) 119/67 mmHg (07/28 0800) SpO2:  [89 %-99 %] 94 % (07/28 0939) FiO2 (%):  [60 %] 60 % (07/28 0939) Weight:  [77 kg (169 lb 12.1 oz)] 77 kg (169 lb 12.1 oz) (07/28 0331)  Weight change: 1.8 kg (3 lb 15.5 oz) Filed Weights   04/22/14 0500 04/23/14 0419 04/24/14 0331  Weight: 75.9 kg (167 lb 5.3 oz) 75.2 kg (165 lb 12.6 oz) 77 kg (169 lb 12.1 oz)    Intake/Output: I/O last 3 completed shifts: In: 290 [P.O.:240; IV Piggyback:50] Out: 450 [Urine:450]   Intake/Output this shift:     General: ill appering in mild respiratory distress.  Head: Normocephalic and atraumatic.  Neck: JVD flat Lungs: fine Crackles dependently  Heart: RRR, no murmur  Abdomen: soft, non-tender, not distended  Extremities: 1+ ankle edema      Basic Metabolic Panel:  Recent Labs Lab 04/19/14 0215 04/21/2014 0230 04/02/2014 1140 04/21/14 0245 04/23/14 0020 04/24/14  NA 134* 135*  --  138 135* 131*  K 3.8 3.9  --  4.4 5.0 5.8*  CL 95* 95*  --  97 93* 90*  CO2 24 23  --  25 22 16*  GLUCOSE 159* 228*  --  144* 152* 170*  BUN 37* 43*  --  50* 94* 127*  CREATININE 1.33 1.28  --  1.29 2.06* 2.56*  CALCIUM 9.0 9.2  --  9.4 9.6 9.4  MG  --   --  2.2  --   --   --     Liver Function Tests:  Recent Labs Lab 04/07/2014 2027  AST 37  ALT 38  ALKPHOS 61  BILITOT 1.5*  PROT 5.8*  ALBUMIN 2.6*   No results found for this basename: LIPASE, AMYLASE,  in the last 168 hours No results found for this basename: AMMONIA,  in the last 168 hours  CBC:  Recent Labs Lab 03/30/2014 2027 04/19/14 0215 04/12/2014 0230 04/21/14 0245 04/23/14 0020  WBC 5.2 5.1 5.2 3.6* 4.6   NEUTROABS 4.9  --   --   --   --   HGB 10.5* 9.3* 8.8* 8.7* 9.8*  HCT 31.7* 27.2* 26.4* 26.0* 30.1*  MCV 97.5 95.1 96.4 97.0 97.4  PLT 167 127* 147* 131* 137*    Cardiac Enzymes:  Recent Labs Lab 04/18/14 0350 04/18/14 0945 04/18/14 1500  TROPONINI <0.30 <0.30 <0.30    BNP: No components found with this basename: POCBNP,   CBG:  Recent Labs Lab 04/24/14 0813  GLUCAP 101*    Microbiology: Results for orders placed during the hospital encounter of 04/09/2014  CULTURE, BLOOD (ROUTINE X 2)     Status: None   Collection Time    04/18/14  8:35 AM      Result Value Ref Range Status   Specimen Description BLOOD RIGHT ANTECUBITAL   Final   Special Requests BOTTLES DRAWN AEROBIC AND ANAEROBIC 10CC   Final   Culture  Setup Time     Final   Value: 04/18/2014 13:03     Performed at [facility]   Culture     Final   Value: NO GROWTH 5  DAYS     Performed at [facility]   Report Status 04/24/2014 FINAL   Final  CULTURE, BLOOD (ROUTINE X 2)     Status: None   Collection Time    04/18/14  8:50 AM      Result Value Ref Range Status   Specimen Description BLOOD LEFT HAND   Final   Special Requests BOTTLES DRAWN AEROBIC AND ANAEROBIC 5CC   Final   Culture  Setup Time     Final   Value: 04/18/2014 13:02     Performed at [facility]   Culture     Final   Value: NO GROWTH 5 DAYS     Performed at [facility]   Report Status 04/24/2014 FINAL   Final  URINE CULTURE     Status: None   Collection Time    04/18/14 10:12 AM      Result Value Ref Range Status   Specimen Description URINE, CLEAN CATCH   Final   Special Requests NONE   Final   Culture  Setup Time     Final   Value: 04/18/2014 15:33     Performed at [place]     Final   Value: 20,OOO COLONIES/ML     Performed at [facility]   Culture     Final   Value: STAPHYLOCOCCUS SPECIES (COAGULASE NEGATIVE)     Note: RIFAMPIN AND GENTAMICIN SHOULD NOT BE  USED AS SINGLE DRUGS FOR TREATMENT OF STAPH INFECTIONS.     Performed at [facility]   Report Status 04/03/2014 FINAL   Final   Organism ID, Bacteria STAPHYLOCOCCUS SPECIES (COAGULASE NEGATIVE)   Final    Coagulation Studies: No results found for this basename: LABPROT, INR,  in the last 72 hours  Urinalysis: No results found for this basename: COLORURINE, APPERANCEUR, LABSPEC, PHURINE, GLUCOSEU, HGBUR, BILIRUBINUR, KETONESUR, PROTEINUR, UROBILINOGEN, NITRITE, LEUKOCYTESUR,  in the last 72 hours    Imaging: Dg Chest 2 View  04/22/2014   CLINICAL DATA:  73 year old male with respiratory failure. Atrial flutter, Goodpasture syndrome, heart failure. Recent pneumonia.  EXAM: CHEST  2 VIEW  COMPARISON:  04/14/2014 and prior chest radiographs  FINDINGS: Cardiomegaly again noted.  Diffuse airspace and interstitial opacities are again noted. Increasing opacity within the left lower lung noted.  There is no evidence of pneumothorax or large pleural effusion.  No acute bony abnormalities are present.  IMPRESSION: Diffuse interstitial and airspace opacities again noted with increasing opacity within the left lower lung. No other significant change.   Electronically Signed   By: Hassan Rowan M.D.   On: 04/22/2014 17:20   Ct Chest High Resolution  04/23/2014   CLINICAL DATA:  Goodpasture's syndrome with worsening hypoxemic respiratory failure.  EXAM: CT CHEST WITHOUT CONTRAST  TECHNIQUE: Multidetector CT imaging of the chest was performed following the standard protocol without intravenous contrast. High resolution imaging of the lungs, as well as inspiratory and expiratory imaging, was performed.  COMPARISON:  03/30/2014.  FINDINGS: Low-attenuation lesions in the thyroid measure up to 1.6 cm on the right. Mediastinal lymph nodes are not enlarged by CT size criteria. Hilar regions are difficult to definitively evaluate without IV contrast but appear grossly unremarkable. No axillary adenopathy. Pulmonary  arteries are enlarged. Atherosclerotic calcification of the arterial vasculature, including three-vessel involvement of the coronary arteries. Heart is mildly enlarged. No pericardial effusion.  Image quality is degraded by respiratory motion. Reticular opacities are seen with  a slight basilar predominance and some areas of subpleural sparing. Given the craniocaudal gradient, there is slight relative sparing of the costophrenic angles as well. Traction bronchiectasis/bronchiolectasis and architectural does are seen in association. When compared with 03/30/2014, findings appear progressive with fairly diffuse superimposed ground-glass. No definite air trapping. No pleural fluid. Airway is unremarkable.  Incidental imaging of the upper abdomen shows the visualized portions of the liver, gallbladder, adrenal glands, right kidney, spleen, pancreas, stomach and bowel to be grossly unremarkable. No upper abdominal adenopathy. No worrisome lytic or sclerotic lesions. Degenerative changes are seen in the spine.  IMPRESSION: 1. 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 Superimposed ground-glass is indicative of pulmonary hemorrhage. 2. Enlarged pulmonary arteries, indicative of pulmonary arterial hypertension. 3. Three-vessel coronary artery calcification. 4. Low-attenuation lesions in the thyroid. Consider further evaluation with thyroid ultrasound. If patient is clinically hyperthyroid, consider nuclear medicine thyroid uptake and scan.   Electronically Signed   By: Lorin Picket M.D.   On: 04/23/2014 12:04     Medications:   . sodium chloride    . citrate dextrose     . therapeutic plasma exchange solution   Dialysis Once in dialysis  . antiseptic oral  rinse  15 mL Mouth Rinse BID  . calcium carbonate  2 tablet Oral Q3H  . calcium gluconate IVPB  4 g Intravenous Once  . clonazePAM  0.25 mg Oral BID  . [START ON 05/10/2014] cyanocobalamin  1,000 mcg Intramuscular Q30 days  . cyclobenzaprine  5 mg Oral QHS  . cyclophosphamide  100 mg Oral Daily  . [START ON 04/25/2014] darbepoetin (ARANESP) injection - NON-DIALYSIS  60 mcg Subcutaneous Q Wed-1800  . tamsulosin  0.4 mg Oral Daily   And  . dutasteride  0.5 mg Oral Daily  . heparin  1,000 Units Intracatheter Once  . latanoprost  1 drop Both Eyes q morning - 10a  . levalbuterol  1.25 mg Nebulization 4 times per day  . metoprolol tartrate  25 mg Oral 4 times per day  . montelukast  10 mg Oral QHS  . pantoprazole  40 mg Oral Q1200  . predniSONE  60 mg Oral Q breakfast  . sodium chloride  500 mL Intravenous Once  . sulfamethoxazole-trimethoprim  1 tablet Oral Daily   acetaminophen, acetaminophen, diphenhydrAMINE, levalbuterol, metoprolol, ondansetron (ZOFRAN) IV, sodium chloride  Assessment/ Plan:  This is a very unfortunate man with multiple reasons to be dyspneic. Congestive heart failure, pulmonary edema and interstitial lung disease and possible pneumonia. He has been immunosuppressed since his initial diagnosis of Goodpastures and has had consistently negative serologies. I have discussed the case with Dr Posey Pronto who has been closely monitoring the patient for progression. His renal function has been stable although he has had persistent hematuria.   The acute renal failure appears related to a drop in blood pressure and hypoperfusion of his kidneys   We are awaiting anti GBM. It appears that a active vasculitis is being considered as a likely cause of her respiratory failure. We shall therefore start pheresis treatments  Hyperkalemia with metabolic acidosis will start IV bicarbonate        LOS: 7 Catherina Pates W @TODAY @9 :59 AM

## 2014-04-24 NOTE — Progress Notes (Signed)
Patient evaluated for community based chronic disease management services with [facility] as a benefit of patient's [employer]. Spoke with patient and wife at bedside to explain [facility] services.  Services have been accepted with written consent.  Wife is authorized contact Sherian Maroon [phone].  Wife is now accepting of [facility] services due to patients progressing weakness.  She was tearful about being able to keep up with the rapid increase in his care needs.  We will assess his nutritional needs, long range personal care/respite needs, and disease management.  Patient will receive a post discharge transition of care call and will be evaluated for monthly home visits for assessments and disease process education.  Left contact information and [facility] literature at bedside. Made Inpatient Case Manager aware that [place] following. Of note, [facility] Management services does not replace or interfere with any services that are arranged by inpatient case management or social work.  For additional questions or referrals please contact Corliss Blacker BSN RN [facility] Liaison at [phone].

## 2014-04-24 NOTE — Procedures (Signed)
R IJ Trialysis cathter placement with [place] and fluoroscopy No complication No blood loss. See complete dictation in [facility].

## 2014-04-24 NOTE — Progress Notes (Signed)
PULMONARY / CRITICAL CARE MEDICINE  Name: Bryan Hubbard MRN: [id number] DOB: 06/09/1941    ADMISSION DATE:  04/03/2014 CONSULTATION DATE:  03/31/2014  REFERRING MD :  Thereasa Solo  PRIMARY SERVICE:  TRH  CHIEF COMPLAINT:  Hypoxemic respiratory failure  BRIEF PATIENT DESCRIPTION: [name] with PMH of Goodpastures - O2 dependent, presents with worsening SOB, dizziness, weakness and LE swelling.  Cards consulted and did not feel that SOB was due to a primary cardiac reason.  PCCM was consulted for further evaluation.  SIGNIFICANT EVENTS / STUDIES:  3/27 TTE >>> PAP 45 torr 7/3   Chest CT >>> bilateral interstitial lung abnormality without nodules or consolidation, no effusion  7/7   VQ scan >>> intermediate probability of PE, new acute perfusion defect LUL since prior VQ in 12/2013  7/7   PFT >>> restrictive pattern, severe diffusion impairment, significant bronchodilator responce  7/8   Venous duplex >>> no clot  7/24 DC cardioversion 7/27 CT chest high resolution> worsening traction bronchiectasis and diffuse GGO worrisome for pneumonitis/Goodpasture syndrome  LINES / TUBES:  CULTURES: Urine 7/22 >> coag neg staph (likely contaminant) Blood 7/22 >>   ANTIBIOTICS:  INTERVAL HISTORY:   Breathing is about the same, no sputum production  VITAL SIGNS: Temp:  [97.2 F (36.2 C)-97.9 F (36.6 C)] 97.5 F (36.4 C) (07/28 0700) Pulse Rate:  [61-85] 63 (07/28 0800) Resp:  [15-27] 15 (07/28 0800) BP: (97-119)/(63-71) 119/67 mmHg (07/28 0800) SpO2:  [89 %-99 %] 94 % (07/28 0939) FiO2 (%):  [60 %] 60 % (07/28 0939) Weight:  [77 kg (169 lb 12.1 oz)] 77 kg (169 lb 12.1 oz) (07/28 0331)  HEMODYNAMICS:   VENTILATOR SETTINGS: Vent Mode:  [-]  FiO2 (%):  [60 %] 60 %  INTAKE / OUTPUT: Intake/Output     07/27 0701 - 07/28 0700 07/28 0701 - 07/29 0700   P.O. 120    IV Piggyback 50    Total Intake(mL/kg) 170 (2.2)    Urine (mL/kg/hr) 325 (0.2)    Total Output 325     Net -155            PHYSICAL EXAMINATION: General: no acute distress HEENT: NCAT, NRB mask, OP clear PULM: crackles in bases bilaterally CV: RRR, no mgr, no JVD AB: BS+, soft, nontender Ex: edema R leg > L improved today Neuro: A&Ox4, MAEW  LABS: I have reviewed all of today's lab results. Relevant abnormalities are discussed in the A/P section  CXR: NNF ASSESSMENT / PLAN:  Acute hypoxemic respiratory failure due to: -Goodpasture syndrome (primary problem) -PE -pulmonary edema (resolved)  To undergo plasmapheresis today Wean O2 for O2 saturation > 92% Use high flow oxygen Continue prednisone and cytoxan Bactrim for pcp prophylaxis  AKI > presumably due to diuresis IVF bolus per TRH Monitor BMET If continues to rise, will d/c bactrim and use dapsone or atovaquone  AFRVR > SR s/p DCCV, Cards managing Possible component of edema Chronic diastolic heart failure Mgmt per Cards  Wife updated at length at bedside  Roselie Awkward, MD [place] [facility] Pager: [phone] Cell: [phone] If no response, call [phone]   04/24/2014, 10:08 AM

## 2014-04-24 NOTE — Progress Notes (Signed)
[name]  Bryan Hubbard [id number] DOB: Aug 09, 1941 DOA: 04/16/2014 PCP: [name], MD  Brief narrative: 73 yo WM PMHx underlying chronic atrial flutter, recent DVT, Goodpasture's, and recent oxygen dependent respiratory failure after hospitalization for community acquired pneumonia last month who presented to the hospital with progressive shortness of breath and lower extremity edema. In addition he was becoming very dizzy and very weak with standing. He notified his cardiologist of these symptoms several days prior to presentation and was instructed to decrease his metoprolol to 25 mg by mouth twice a day. Unfortunately his symptoms did not improve.  He presented to the hospital where it was noted that his systolic blood pressure was in the upper 80s and upon standing dropped into the 70s. Review of patient medication revealed he was taking Lasix 20 mg daily and had continued to take this medication prior to presentation. In addition he endorsed having to increase his oxygen at home from 2 L to 4 L because of progressive shortness of breath. A two-view chest x-ray demonstrated acutely worsened diffuse bilateral airspace opacification with a peripheral distribution and diffuse increased interstitial markings that could either be acute infectious or inflammatory interstitial pneumonitis.   HPI/Subjective: Alert, relates positioning of high flow O2 tubing uncomfortable  Assessment/Plan:  Atrial flutter with rapid ventricular response-maintaining NSR -as per Cardiology  -failed attempts to use cardizem, BB, or both to control RVR -Ibutilide infusion unsuccessful > DCCV successful under bedside anesthesia -maintaining NSR at this time  -since had prolonged RVR will check ECHO to ensure no tachycardia mediated CM  Goodpasture's -chronically treated with steroid, oral cytoxan and prn plasmapheresis  -unclear if current respiratory symptoms are a  manifestation of Goodpasture's or primarily related to atrial flutter with RVR -per Pulmonary chest x-ray is consistent with interstitial lung disease of uncertain etiology - due to current respiratory compromise unable to proceed with biopsy to help clarify diagnosis -pulse steroids at higher dose for several days without improvement in respiratory sx's -continue preadmission immunosuppressants -Due to lack of response to diuretics and worsening renal status have asked nephrology to evaluate the patient regarding his Goodpasture's related renal disease -Patient will have plasmapheresis catheter inserted today by IR and begin plasmapheresis  Acute on chronic respiratory failure with hypoxia   A) Acute reate related diastolic congestive heart failure exacerbation    B) Pulmonary Goodpasture's  -See above -attempted to gently diurese but unfortunately have only worsened renal function without any improvement in respiratory status  -High resolution CT Chest 7/27 ordered by pulmonary- they are concerned that findings c/w Goodpastures so they have spoken with Dr. Posey Pronto and plans are to insert cath and begin plasmapheresis 7/28  Glomerulonephritis /Acute kidney Injury on Chronic kidney disease stage 3, GFR 30-59 ml/min -Fnx had worsened somewhat on 7/27 so Nephro consulted -markedly worse on 7/28 with BUN now 127 and Scr 2.56-suspect over diuresis so Lasix 40 mg IV daily dc'd, given 500 cc NS bolus and begin gentle IVF hydration- may be able to use higher rate if ECHO ordered today shows normal LVEF -Nephrology added Bicarb infusion -may need to stop potentially nephrotoxic drugs as well (Bactrim for PCP prophylaxis)   Hypoxemic mediated anxiety -Add Klonopin  Abnormal UA -cx positive only for 20,000 colonies of coag neg staph - further tx not currently indicated   Hematuria (recurrent) -pt states this is a recurring problem which has improve w/ his use of Jalyn - BP improved so was  resumed 7/26  - urine clearing today 7/27  Anemia of chronic disease -Hemoglobin stable - baseline between 7.9 and 9   Thrombocytopenia -follow closely - resolving slowly and greater than 100,000   HYPERLIPIDEMIA  HYPERTENSION -BP well controlled  History of DVT / PE / IVC filter -was transitioned from warfarin to eliquis during the last hospitalization at recommendation of the Pulmonologist in setting of inability to maintain therapeutic INR on warfarin  DVT prophylaxis: Eliquis-7/28 held previous 24 hrs pre Trialysis catheter placement Code Status: Full Family Communication: spoke w/ pt and wife at length   Disposition Plan/Expected LOS: Step down  Consultants: Cardiology Pulmonology Nephrology  IR   Procedures: 7/24 - bedside DCCV 7/28- Trialysis catheter 7/28- ECHO pending  Positive Cultures: Urine culture coag neg staph  Antibiotics: None  Objective: Blood pressure 99/66, pulse 65, temperature 97.5 F (36.4 C), temperature source Oral, resp. rate 20, height 5\' 10"  (1.778 m), weight 169 lb 12.1 oz (77 kg), SpO2 99.00%.  Intake/Output Summary (Last 24 hours) at 04/24/14 1449 Last data filed at 04/24/14 1400  Gross per 24 hour  Intake    540 ml  Output    375 ml  Net    165 ml   Exam: General: Mild acute respiratory distress w/o signif change  Lungs: Diminished throughout and essentially CTA - no wheeze - now on high flow oxygen cannula w/ humidification Cardiovascular: regular rate and rhythm presently w/o gallup or rub  Abdomen: Nontender, nondistended, soft, bowel sounds positive, no rebound, no ascites, no appreciable mass Musculoskeletal: No significant cyanosis, clubbing of bilateral lower extremities - trace B LE edema   Scheduled Meds:  Scheduled Meds: . therapeutic plasma exchange solution   Dialysis Q1 Hr x 4  . antiseptic oral rinse  15 mL Mouth Rinse BID  . calcium carbonate  2 tablet Oral Q3H  . calcium gluconate IVPB  4 g Intravenous Once  . clonazePAM   0.25 mg Oral BID  . [START ON 05/10/2014] cyanocobalamin  1,000 mcg Intramuscular Q30 days  . cyclobenzaprine  5 mg Oral QHS  . cyclophosphamide  100 mg Oral Daily  . [START ON 04/25/2014] darbepoetin (ARANESP) injection - NON-DIALYSIS  60 mcg Subcutaneous Q Wed-1800  . tamsulosin  0.4 mg Oral Daily   And  . dutasteride  0.5 mg Oral Daily  . heparin      . heparin  1,000 Units Intracatheter Once  . latanoprost  1 drop Both Eyes q morning - 10a  . levalbuterol  1.25 mg Nebulization 4 times per day  . metoprolol tartrate  25 mg Oral 4 times per day  . montelukast  10 mg Oral QHS  . pantoprazole  40 mg Oral Q1200  . predniSONE  60 mg Oral Q breakfast  . sulfamethoxazole-trimethoprim  1 tablet Oral Daily   Data Reviewed: Basic Metabolic Panel:  Recent Labs Lab 04/19/14 0215 04/01/2014 0230 04/01/2014 1140 04/21/14 0245 04/23/14 0020 04/24/14  NA 134* 135*  --  138 135* 131*  K 3.8 3.9  --  4.4 5.0 5.8*  CL 95* 95*  --  97 93* 90*  CO2 24 23  --  25 22 16*  GLUCOSE 159* 228*  --  144* 152* 170*  BUN 37* 43*  --  50* 94* 127*  CREATININE 1.33 1.28  --  1.29 2.06* 2.56*  CALCIUM 9.0 9.2  --  9.4 9.6 9.4  MG  --   --  2.2  --   --   --  Liver Function Tests:  Recent Labs Lab 04/24/2014 2027  AST 37  ALT 38  ALKPHOS 61  BILITOT 1.5*  PROT 5.8*  ALBUMIN 2.6*   CBC:  Recent Labs Lab 03/29/2014 2027 04/19/14 0215 04/25/2014 0230 04/21/14 0245 04/23/14 0020 04/24/14 0930  WBC 5.2 5.1 5.2 3.6* 4.6 5.1  NEUTROABS 4.9  --   --   --   --   --   HGB 10.5* 9.3* 8.8* 8.7* 9.8* 10.0*  HCT 31.7* 27.2* 26.4* 26.0* 30.1* 30.6*  MCV 97.5 95.1 96.4 97.0 97.4 98.4  PLT 167 127* 147* 131* 137* 156   Cardiac Enzymes:  Recent Labs Lab 04/18/14 0350 04/18/14 0945 04/18/14 1500  TROPONINI <0.30 <0.30 <0.30   BNP (last 3 results)  Recent Labs  03/05/14 0938 03/30/14 2015 04/05/2014 2027  PROBNP 573.0* 7170.0* 6768.0*   Studies:  Recent x-ray studies have been reviewed in  detail by the Attending Physician  Time spent :  80mins  Allison Ellis, ANP [facility] For Consults/Admissions - Flow Manager - [phone] Office  [phone] Pager [phone]  On-Call/Text Page:      [email or web address]      password [facility]  04/24/2014, 2:49 PM   LOS: 7 days      Examined patient and discussed assessment and plan with ANP Ebony Hail Discussed plan with patient and wife answered all questions Patient with multiple complex medical problems> 40 minutes used in direct patient care

## 2014-04-24 NOTE — Progress Notes (Signed)
Patient ID: Bryan Hubbard, male   DOB: [birth year], 73 y.o.   MRN: [id number]   Patient remains in NSR.  He is undergoing plasmapheresis today.  Cardiology will follow from a distance, call if more problems with rhythm, etc.   Loralie Champagne 04/24/2014

## 2014-04-24 NOTE — Progress Notes (Signed)
Note renal function significantly worse- consultants notes also reviewed- agree pulmonary edema not playing significant role in respiratory symptoms- given renal function will dc Lasix and give NS 500 cc once. Back in NSR x >48 hrs but since had persistent tachycardia will check ECHO to ensure no tachy mediated CM- this will help with calculating volume replacement rate.  Erin Hearing, ANP

## 2014-04-24 NOTE — Progress Notes (Signed)
MD paged of K of 6.8. Awaiting new orders.

## 2014-04-25 ENCOUNTER — Inpatient Hospital Stay: Payer: Medicare Other | Admitting: Pulmonary Disease

## 2014-04-25 ENCOUNTER — Inpatient Hospital Stay (HOSPITAL_COMMUNITY): Admission: RE | Admit: 2014-04-25 | Payer: Medicare Other | Source: Ambulatory Visit

## 2014-04-25 ENCOUNTER — Inpatient Hospital Stay (HOSPITAL_COMMUNITY): Payer: Medicare Other

## 2014-04-25 DIAGNOSIS — N189 Chronic kidney disease, unspecified: Secondary | ICD-10-CM

## 2014-04-25 DIAGNOSIS — I369 Nonrheumatic tricuspid valve disorder, unspecified: Secondary | ICD-10-CM

## 2014-04-25 DIAGNOSIS — N179 Acute kidney failure, unspecified: Secondary | ICD-10-CM

## 2014-04-25 LAB — BASIC METABOLIC PANEL
ANION GAP: 21 — AB (ref 5–15)
BUN: 120 mg/dL — ABNORMAL HIGH (ref 6–23)
CHLORIDE: 94 meq/L — AB (ref 96–112)
CO2: 22 mEq/L (ref 19–32)
CREATININE: 2.15 mg/dL — AB (ref 0.50–1.35)
Calcium: 8.9 mg/dL (ref 8.4–10.5)
GFR, EST AFRICAN AMERICAN: 33 mL/min — AB (ref >90)
GFR, EST NON AFRICAN AMERICAN: 29 mL/min — AB (ref >90)
Glucose, Bld: 180 mg/dL — ABNORMAL HIGH (ref 70–99)
Potassium: 4.2 mEq/L (ref 3.7–5.3)
Sodium: 137 mEq/L (ref 137–147)

## 2014-04-25 LAB — POCT I-STAT, CHEM 8
BUN: >140 mg/dL — ABNORMAL HIGH (ref 6–23)
CHLORIDE: 95 meq/L — AB (ref 96–112)
Calcium, Ion: 1.16 mmol/L (ref 1.13–1.30)
Creatinine, Ser: 3.3 mg/dL — ABNORMAL HIGH (ref 0.50–1.35)
GLUCOSE: 136 mg/dL — AB (ref 70–99)
HCT: 30 % — ABNORMAL LOW (ref 39.0–52.0)
HEMOGLOBIN: 10.2 g/dL — AB (ref 13.0–17.0)
Potassium: 5 mEq/L (ref 3.7–5.3)
SODIUM: 128 meq/L — AB (ref 137–147)
TCO2: 23 mmol/L (ref 0–100)

## 2014-04-25 LAB — CBC
HCT: 26.8 % — ABNORMAL LOW (ref 39.0–52.0)
Hemoglobin: 9 g/dL — ABNORMAL LOW (ref 13.0–17.0)
MCH: 32 pg (ref 26.0–34.0)
MCHC: 33.6 g/dL (ref 30.0–36.0)
MCV: 95.4 fL (ref 78.0–100.0)
PLATELETS: 119 10*3/uL — AB (ref 150–400)
RBC: 2.81 MIL/uL — ABNORMAL LOW (ref 4.22–5.81)
RDW: 20.3 % — ABNORMAL HIGH (ref 11.5–15.5)
WBC: 4.7 10*3/uL (ref 4.0–10.5)

## 2014-04-25 LAB — MPO/PR-3 (ANCA) ANTIBODIES: Myeloperoxidase Abs: <1

## 2014-04-25 MED ORDER — CALCIUM CARBONATE ANTACID 500 MG PO CHEW
CHEWABLE_TABLET | ORAL | Status: AC
Start: 2014-04-25 — End: 2014-04-25
  Administered 2014-04-25: 400 mg
  Filled 2014-04-25: qty 2

## 2014-04-25 MED ORDER — ACD FORMULA A 0.73-2.45-2.2 GM/100ML VI SOLN
Status: AC
  Administered 2014-04-25: 500 mL via INTRAVENOUS
  Filled 2014-04-25: qty 500

## 2014-04-25 MED ORDER — ACD FORMULA A 0.73-2.45-2.2 GM/100ML VI SOLN
Status: AC
  Filled 2014-04-25: qty 500

## 2014-04-25 MED ORDER — HEPARIN SODIUM (PORCINE) 1000 UNIT/ML IJ SOLN
1000.0000 [IU] | Freq: Once | INTRAMUSCULAR | Status: AC
  Administered 2014-04-25: 1000 [IU]

## 2014-04-25 MED ORDER — SODIUM CHLORIDE 0.9 % IV SOLN
4.0000 g | Freq: Once | INTRAVENOUS | Status: AC
  Administered 2014-04-25: 4 g via INTRAVENOUS
  Filled 2014-04-25: qty 40

## 2014-04-25 MED ORDER — SODIUM CHLORIDE 0.9 % IV SOLN
INTRAVENOUS | Status: AC
  Administered 2014-04-25 (×4): via INTRAVENOUS_CENTRAL
  Filled 2014-04-25 (×4): qty 200

## 2014-04-25 MED ORDER — ACETAMINOPHEN 325 MG PO TABS
650.0000 mg | ORAL_TABLET | ORAL | Status: DC | PRN
Start: 2014-04-25 — End: 2014-04-25

## 2014-04-25 MED ORDER — ACETAMINOPHEN 325 MG PO TABS: 650.0000 mg | ORAL_TABLET | ORAL | Status: DC | PRN

## 2014-04-25 MED ORDER — APIXABAN 5 MG PO TABS
5.0000 mg | ORAL_TABLET | Freq: Two times a day (BID) | ORAL | Status: DC
  Filled 2014-04-25: qty 1

## 2014-04-25 MED ORDER — ACD FORMULA A 0.73-2.45-2.2 GM/100ML VI SOLN
500.0000 mL | Status: DC
  Administered 2014-04-25: 500 mL via INTRAVENOUS

## 2014-04-25 MED ORDER — DIPHENHYDRAMINE HCL 25 MG PO CAPS: 25.0000 mg | ORAL_CAPSULE | Freq: Four times a day (QID) | ORAL | Status: DC | PRN

## 2014-04-25 MED ORDER — CYANOCOBALAMIN 1000 MCG/ML IJ SOLN: 1000.0000 ug | INTRAMUSCULAR | Status: DC

## 2014-04-25 MED ORDER — CALCIUM CARBONATE ANTACID 500 MG PO CHEW
2.0000 | CHEWABLE_TABLET | ORAL | Status: AC
  Filled 2014-04-25 (×2): qty 2

## 2014-04-25 NOTE — Progress Notes (Signed)
  Echocardiogram 2D Echocardiogram has been performed.  Diamond Nickel 04/25/2014, 2:35 PM

## 2014-04-25 NOTE — Progress Notes (Addendum)
[facility]  Bryan Hubbard SWN:[id number] DOB: July 22, 1941 DOA: 04/12/2014 PCP: [name], MD  Brief narrative: 73 yo M Hx chronic atrial flutter, DVT, Goodpasture's, and recent oxygen dependent respiratory failure after hospitalization for community acquired pneumonia last month who presented to the hospital with progressive shortness of breath and lower extremity edema. In addition he was becoming very dizzy and very weak with standing. He notified his cardiologist of these symptoms several days prior to presentation and was instructed to decrease his metoprolol to 25 mg by mouth twice a day. Unfortunately his symptoms did not improve.  He presented to the hospital where it was noted that his systolic blood pressure was in the upper 80s and upon standing dropped into the 70s. Review of patient medication revealed he was taking Lasix 20 mg daily and had continued to take this medication prior to presentation. In addition he endorsed having to increase his oxygen at home from 2 L to 4 L because of progressive shortness of breath. A two-view chest x-ray demonstrated acutely worsened diffuse bilateral airspace opacification with a peripheral distribution and diffuse increased interstitial markings that could either be acute infectious or inflammatory interstitial pneumonitis.   HPI/Subjective: Alert, appears more comfortable from a respiratory standpoint today- eating better.  Assessment/Plan:  Atrial flutter with rapid ventricular response-maintaining NSR -as per Cardiology -they will follow at a distance since has been stable -Initially failed attempts to use cardizem, BB, or both to control RVR -Ibutilide infusion was unsuccessful > DCCV successful under bedside anesthesia -maintaining NSR at this time  -since had prolonged RVR will check ECHO to ensure no tachycardia mediated CM  Goodpasture's -chronically treated with steroid, oral cytoxan and prn  plasmapheresis  -Initially it was unclear if respiratory symptoms were a manifestation of Goodpasture's or primarily related to atrial flutter with RVR-after converted to NSR and diuresis symptoms unfortunately persisted -per Pulmonary chest x-ray was consistent with interstitial lung disease of uncertain etiology - due to current respiratory compromise unable to proceed with biopsy to help clarify diagnosis-Did receive pulse steroids at higher dose for several days without improvement in respiratory sx's  -continue preadmission immunosuppressants -Patient subsequently underwent plasmapheresis catheter insertion 7/28 by IR and began plasmapheresis (see below)  Acute on chronic respiratory failure with hypoxia   A) Acute reate related diastolic congestive heart failure exacerbation    B) Pulmonary Goodpasture's    C) ? Opportunistic infection -Initially attempted to gently diurese but unfortunately only achieved worsened renal function without any improvement in respiratory status  -High resolution CT Chest 7/27 ordered by pulmonary with findings c/w Goodpastures and plasmapheresis initiated 7/28 and as of 7/29 respiratory sx's appear to be improving -on PCP prophylaxis w/ Bactrim per Pulmonary  Glomerulonephritis /Acute kidney Injury on Chronic kidney disease stage 3, GFR 30-59 ml/min -Fnx had worsened somewhat on 7/27 so Nephro consulted -markedly worse on 7/28 with BUN 127 and Scr 2.56-suspected over diuresis so Lasix dc'd -Nephrology added Bicarb infusion 7/28   Hypoxemic mediated anxiety -Klonopin  Abnormal UA -cx positive only for 20,000 colonies of coag neg staph - further tx not currently indicated   Hematuria (recurrent) -pt states this is a recurring problem which has improve w/ his use of Jalyn - BP improved so was resumed 7/26 - urine clearing as of 7/27  Anemia of chronic disease -Hemoglobin stable - baseline between 7.9 and 9   Thrombocytopenia -follow closely - resolving  slowly and greater than 100,000  HYPERLIPIDEMIA  HYPERTENSION -BP well controlled  History of DVT / PE / IVC filter -was transitioned from warfarin to eliquis during the last hospitalization at recommendation of the Pulmonologist in setting of inability to maintain therapeutic INR on warfarin  DVT prophylaxis: SCDs Code Status: Full Family Communication: spoke w/ pt and wife at length   Disposition Plan/Expected LOS: Step down  Consultants: Cardiology Pulmonology Nephrology  IR   Procedures: 7/24 - bedside DCCV 7/28- Trialysis catheter 7/28- ECHO pending  Positive Cultures: Urine culture coag neg staph  Antibiotics: None  Objective: Blood pressure 105/57, pulse 96, temperature 97.8 F (36.6 C), temperature source Oral, resp. rate 18, height 5\' 10"  (1.778 m), weight 173 lb 1.6 oz (78.518 kg), SpO2 100.00%.  Intake/Output Summary (Last 24 hours) at 04/25/14 1405 Last data filed at 04/25/14 1400  Gross per 24 hour  Intake   3140 ml  Output   1200 ml  Net   1940 ml   Exam: General: No acute respiratory distress at rest Lungs: Diminished throughout with fine diffuse crakles - no wheeze - now on high flow oxygen cannula w/ humidification Cardiovascular: regular rate and sinus rhythm presently w/o gallup or rub  Abdomen: Nontender, nondistended, soft, bowel sounds positive, no rebound, no ascites, no appreciable mass Musculoskeletal: No significant cyanosis, clubbing of bilateral lower extremities - trace B LE edema   Scheduled Meds:  Scheduled Meds: . therapeutic plasma exchange solution   Dialysis Q1 Hr x 4  . antiseptic oral rinse  15 mL Mouth Rinse BID  . calcium carbonate  2 tablet Oral Q3H  . calcium gluconate IVPB  4 g Intravenous Once  . citrate dextrose      . clonazePAM  0.25 mg Oral BID  . [START ON 05/10/2014] cyanocobalamin  1,000 mcg Intramuscular Q30 days  . cyclobenzaprine  5 mg Oral QHS  . cyclophosphamide  100 mg Oral Daily  . darbepoetin  (ARANESP) injection - NON-DIALYSIS  60 mcg Subcutaneous Q Wed-1800  . tamsulosin  0.4 mg Oral Daily   And  . dutasteride  0.5 mg Oral Daily  . heparin  1,000 Units Intracatheter Once  . latanoprost  1 drop Both Eyes q morning - 10a  . levalbuterol  1.25 mg Nebulization 4 times per day  . metoprolol tartrate  25 mg Oral 4 times per day  . montelukast  10 mg Oral QHS  . pantoprazole  40 mg Oral Q1200  . predniSONE  60 mg Oral Q breakfast  . sulfamethoxazole-trimethoprim  1 tablet Oral Daily   Data Reviewed: Basic Metabolic Panel:  Recent Labs Lab 04/08/2014 1140 04/21/14 0245 04/23/14 0020 04/24/14 04/24/14 1402 04/24/14 1408 04/25/14 0236  NA  --  138 135* 131* 133* 128* 137  K  --  4.4 5.0 5.8* 5.1 5.0 4.2  CL  --  97 93* 90* 90* 95* 94*  CO2  --  25 22 16* 22  --  22  GLUCOSE  --  144* 152* 170* 136* 136* 180*  BUN  --  50* 94* 127* 135* >140* 120*  CREATININE  --  1.29 2.06* 2.56* 2.47* 3.30* 2.15*  CALCIUM  --  9.4 9.6 9.4 9.1  --  8.9  MG 2.2  --   --   --   --   --   --    Liver Function Tests: No results found for this basename: AST, ALT, ALKPHOS, BILITOT, PROT, ALBUMIN,  in the last 168 hours  CBC:  Recent Labs  Lab 04/26/2014 0230 04/21/14 0245 04/23/14 0020 04/24/14 0930 04/24/14 1408 04/25/14 0236  WBC 5.2 3.6* 4.6 5.1  --  4.7  HGB 8.8* 8.7* 9.8* 10.0* 10.2* 9.0*  HCT 26.4* 26.0* 30.1* 30.6* 30.0* 26.8*  MCV 96.4 97.0 97.4 98.4  --  95.4  PLT 147* 131* 137* 156  --  119*   Cardiac Enzymes:  Recent Labs Lab 04/18/14 1500  TROPONINI <0.30   BNP (last 3 results)  Recent Labs  03/05/14 0938 03/30/14 2015 04/24/2014 2027  PROBNP 573.0* 7170.0* 6768.0*   Studies:  Recent x-ray studies have been reviewed in detail by the Attending Physician  Time spent :  61mins  Allison Ellis, ANP [facility] For Consults/Admissions - Flow Manager - [phone] Office  [phone] Pager [phone]  On-Call/Text Page:      [email or web address]       password [facility]  04/25/2014, 2:05 PM   LOS: 8 days   I have personally examined this patient and reviewed the entire database. I have reviewed the above note, made any necessary editorial changes, and agree with its content.  Cherene Altes, MD [facility]

## 2014-04-25 NOTE — Progress Notes (Signed)
  [place] TEAM 1 - Stepdown/ICU TEAM  Discussed resumption of Eliquis w/ Nephrology and PCCM.  From Pulm standpoint there is not felt to be an absolute contraindication at present.  Nephrology suggests that loss of clotting factors during pheresis could lead to high bleeding risk.  Will not resume today, and will check PT/PTT in AM.  Cherene Altes, MD [facility] For Consults/Admissions - Flow Manager - [phone] Office  [phone] Pager [phone]  On-Call/Text Page:      [email or web address]      password [facility]

## 2014-04-25 NOTE — Progress Notes (Signed)
[facility] ROUNDING NOTE   Subjective:   Interval History: appears improved  Objective:  Vital signs in last 24 hours:  Temp:  [97.3 F (36.3 C)-98.1 F (36.7 C)] 97.4 F (36.3 C) (07/29 0306) Pulse Rate:  [64-71] 64 (07/29 0639) Resp:  [13-29] 20 (07/29 0810) BP: (86-122)/(57-84) 104/60 mmHg (07/29 0810) SpO2:  [91 %-100 %] 98 % (07/29 0810) FiO2 (%):  [60 %] 60 % (07/29 0329) Weight:  [78.518 kg (173 lb 1.6 oz)] 78.518 kg (173 lb 1.6 oz) (07/29 0306)  Weight change: 1.518 kg (3 lb 5.5 oz) Filed Weights   04/23/14 0419 04/24/14 0331 04/25/14 0306  Weight: 75.2 kg (165 lb 12.6 oz) 77 kg (169 lb 12.1 oz) 78.518 kg (173 lb 1.6 oz)    Intake/Output: I/O last 3 completed shifts: In: 2725 [P.O.:360; I.V.:1525; IV Piggyback:840] Out: 5537 [Urine:1575]   Intake/Output this shift:  Total I/O In: 75 [I.V.:75] Out: -  General: ill appering in mild respiratory distress.  Head: Normocephalic and atraumatic.  Neck: JVD flat  Lungs: fine Crackles dependently  Heart: RRR, no murmur  Abdomen: soft, non-tender, not distended  Extremities: 1+ ankle edema     Basic Metabolic Panel:  Recent Labs Lab 04/05/2014 1140 04/21/14 0245 04/23/14 0020 04/24/14 04/24/14 1402 04/25/14 0236  NA  --  138 135* 131* 133* 137  K  --  4.4 5.0 5.8* 5.1 4.2  CL  --  97 93* 90* 90* 94*  CO2  --  25 22 16* 22 22  GLUCOSE  --  144* 152* 170* 136* 180*  BUN  --  50* 94* 127* 135* 120*  CREATININE  --  1.29 2.06* 2.56* 2.47* 2.15*  CALCIUM  --  9.4 9.6 9.4 9.1 8.9  MG 2.2  --   --   --   --   --     Liver Function Tests: No results found for this basename: AST, ALT, ALKPHOS, BILITOT, PROT, ALBUMIN,  in the last 168 hours No results found for this basename: LIPASE, AMYLASE,  in the last 168 hours No results found for this basename: AMMONIA,  in the last 168 hours  CBC:  Recent Labs Lab 04/01/2014 0230 04/21/14 0245 04/23/14 0020 04/24/14 0930 04/25/14 0236  WBC 5.2 3.6*  4.6 5.1 4.7  HGB 8.8* 8.7* 9.8* 10.0* 9.0*  HCT 26.4* 26.0* 30.1* 30.6* 26.8*  MCV 96.4 97.0 97.4 98.4 95.4  PLT 147* 131* 137* 156 119*    Cardiac Enzymes:  Recent Labs Lab 04/18/14 1500  TROPONINI <0.30    BNP: No components found with this basename: POCBNP,   CBG:  Recent Labs Lab 04/24/14 0813  GLUCAP 101*    Microbiology: Results for orders placed during the hospital encounter of 04/16/2014  CULTURE, BLOOD (ROUTINE X 2)     Status: None   Collection Time    04/18/14  8:35 AM      Result Value Ref Range Status   Specimen Description BLOOD RIGHT ANTECUBITAL   Final   Special Requests BOTTLES DRAWN AEROBIC AND ANAEROBIC 10CC   Final   Culture  Setup Time     Final   Value: 04/18/2014 13:03     Performed at [facility]   Culture     Final   Value: NO GROWTH 5 DAYS     Performed at [facility]   Report Status 04/24/2014 FINAL   Final  CULTURE, BLOOD (ROUTINE X 2)     Status: None  Collection Time    04/18/14  8:50 AM      Result Value Ref Range Status   Specimen Description BLOOD LEFT HAND   Final   Special Requests BOTTLES DRAWN AEROBIC AND ANAEROBIC 5CC   Final   Culture  Setup Time     Final   Value: 04/18/2014 13:02     Performed at [facility]   Culture     Final   Value: NO GROWTH 5 DAYS     Performed at [facility]   Report Status 04/24/2014 FINAL   Final  URINE CULTURE     Status: None   Collection Time    04/18/14 10:12 AM      Result Value Ref Range Status   Specimen Description URINE, CLEAN CATCH   Final   Special Requests NONE   Final   Culture  Setup Time     Final   Value: 04/18/2014 15:33     Performed at [place]     Final   Value: 20,OOO COLONIES/ML     Performed at [facility]   Culture     Final   Value: STAPHYLOCOCCUS SPECIES (COAGULASE NEGATIVE)     Note: RIFAMPIN AND GENTAMICIN SHOULD NOT BE USED AS SINGLE DRUGS FOR TREATMENT OF STAPH INFECTIONS.      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  EXAM: CT CHEST WITHOUT CONTRAST  TECHNIQUE: Multidetector CT imaging of the chest was performed following the standard protocol without intravenous contrast. High resolution imaging of the lungs, as well as inspiratory and expiratory imaging, was performed.  COMPARISON:  03/30/2014.  FINDINGS: Low-attenuation lesions in the thyroid measure up to 1.6 cm on the right. Mediastinal lymph nodes are not enlarged by CT size criteria. Hilar regions are difficult to definitively evaluate without IV contrast but appear grossly unremarkable. No axillary adenopathy. Pulmonary arteries are enlarged. Atherosclerotic calcification of the arterial vasculature, including three-vessel involvement of the coronary arteries. Heart is mildly enlarged. No pericardial effusion.  Image quality is degraded by respiratory motion. Reticular opacities are seen with a slight basilar predominance and some areas of subpleural sparing. Given the craniocaudal gradient, there is slight relative sparing of the costophrenic angles as well. Traction bronchiectasis/bronchiolectasis and architectural does are seen in association. When compared with 03/30/2014, findings appear progressive with fairly diffuse superimposed ground-glass. No definite air trapping. No pleural fluid. Airway is unremarkable.  Incidental imaging of the upper abdomen shows the visualized  portions of the liver, gallbladder, adrenal glands, right kidney, spleen, pancreas, stomach and bowel to be grossly unremarkable. No upper abdominal adenopathy. No worrisome lytic or sclerotic lesions. Degenerative changes are seen in the spine.  IMPRESSION: 1. 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 Superimposed ground-glass is indicative of pulmonary hemorrhage. 2. Enlarged  pulmonary arteries, indicative of pulmonary arterial hypertension. 3. Three-vessel coronary artery calcification. 4. Low-attenuation lesions in the thyroid. Consider further evaluation with thyroid ultrasound. If patient is clinically hyperthyroid, consider nuclear medicine thyroid uptake and scan.   Electronically Signed   By: Lorin Picket M.D.   On: 04/23/2014 12:04   Ir Fluoro Guide Cv Line Right  04/24/2014   CLINICAL DATA:  Goodpasture's syndrome.  Needs access for pheresis .  TECHNIQUE: RIGHT IJ CATHETER PLACEMENT UNDER ULTRASOUND AND FLUOROSCOPIC GUIDANCE  The procedure, risks (including but not limited to bleeding, infection, organ damage, pneumothorax), benefits, and alternatives were explained to the patient. Questions regarding the procedure were encouraged and answered. The patient understands and consents to the procedure. Patency of the right IJ vein was confirmed with ultrasound with image documentation. An appropriate skin site was determined. Skin site was marked. Region was prepped using maximum barrier technique including cap and mask, sterile gown, sterile gloves, large sterile sheet, and Chlorhexidine as cutaneous antisepsis. The region was infiltrated locally with 1% lidocaine. Under real-time ultrasound guidance, the right IJ vein was accessed with a 19 gauge  needle; the needle tip within the vein was confirmed with ultrasound image documentation. The needle exchanged over a guidewire for vascular dilator which allowed advancement of a 20 cm Trialysis catheter. This was positioned with the tip at the cavoatrial junction. Spot chest radiograph shows good positioning and no pneumothorax. Catheter was flushed and sutured externally with 0-Prolene sutures. Patient tolerated the procedure well, with no immediate complication.  IMPRESSION: 1. Technically successful right IJ Trialysis catheter placement.   Electronically Signed   By: Arne Cleveland M.D.   On: 04/24/2014 11:45   Ir US Guide Vasc Access Right  04/24/2014   CLINICAL DATA:  Goodpasture's syndrome.  Needs access for pheresis .  TECHNIQUE: RIGHT IJ CATHETER PLACEMENT UNDER ULTRASOUND AND FLUOROSCOPIC GUIDANCE  The procedure, risks (including but not limited to bleeding, infection, organ damage, pneumothorax), benefits, and alternatives were explained to the patient. Questions regarding the procedure were encouraged and answered. The patient understands and consents to the procedure. Patency of the right IJ vein was confirmed with ultrasound with image documentation. An appropriate skin site was determined. Skin site was marked. Region was prepped using maximum barrier technique including cap and mask, sterile gown, sterile gloves, large sterile sheet, and Chlorhexidine as cutaneous antisepsis. The region was infiltrated locally with 1% lidocaine. Under real-time ultrasound guidance, the right IJ vein was accessed with a 19 gauge needle; the needle tip within the vein was confirmed with ultrasound image documentation. The needle exchanged over a guidewire for vascular dilator which allowed advancement of a 20 cm Trialysis catheter. This was positioned with the tip at the cavoatrial junction. Spot chest radiograph shows good positioning and no pneumothorax. Catheter was flushed and sutured externally with 0-Prolene  sutures. Patient tolerated the procedure well, with no immediate complication.  IMPRESSION: 1. Technically successful right IJ Trialysis catheter placement.   Electronically Signed   By: Arne Cleveland M.D.   On: 04/24/2014 11:45     Medications:   . sodium chloride Stopped (04/24/14 1300)  . citrate dextrose    .  sodium bicarbonate  infusion 1000 mL 75 mL/hr at 04/25/14 0700   . therapeutic plasma exchange solution   Dialysis Q1 Hr x 4  . antiseptic oral rinse  15 mL Mouth Rinse BID  . calcium carbonate  2 tablet Oral Q3H  . calcium gluconate IVPB  4 g Intravenous Once  .  clonazePAM  0.25 mg Oral BID  . [START ON 05/10/2014] cyanocobalamin  1,000 mcg Intramuscular Q30 days  . cyclobenzaprine  5 mg Oral QHS  . cyclophosphamide  100 mg Oral Daily  . darbepoetin (ARANESP) injection - NON-DIALYSIS  60 mcg Subcutaneous Q Wed-1800  . tamsulosin  0.4 mg Oral Daily   And  . dutasteride  0.5 mg Oral Daily  . heparin  1,000 Units Intracatheter Once  . latanoprost  1 drop Both Eyes q morning - 10a  . levalbuterol  1.25 mg Nebulization 4 times per day  . metoprolol tartrate  25 mg Oral 4 times per day  . montelukast  10 mg Oral QHS  . pantoprazole  40 mg Oral Q1200  . predniSONE  60 mg Oral Q breakfast  . sulfamethoxazole-trimethoprim  1 tablet Oral Daily   acetaminophen, acetaminophen, diphenhydrAMINE, HYDROcodone-acetaminophen, levalbuterol, metoprolol, ondansetron (ZOFRAN) IV, sodium chloride  Assessment/ Plan:  This is a very unfortunate man with multiple reasons to be dyspneic. Congestive heart failure, pulmonary edema and interstitial lung disease and possible pneumonia. He has been immunosuppressed since his initial diagnosis of Goodpastures and has had consistently negative serologies. I have discussed the case with Dr Posey Pronto who has been closely monitoring the patient for progression. His renal function has been stable although he has had persistent hematuria.   The acute renal  failure appears related to a drop in blood pressure and hypoperfusion of his kidneys -  improved  Anti GBM negative  Antibody titer also ordered.This would make active disease unlikely  Hyperkalemia with metabolic acidosis improved stop bicarbonate     LOS: 8 [place] W @TODAY @9 :52 AM

## 2014-04-25 NOTE — Progress Notes (Signed)
PULMONARY / CRITICAL CARE MEDICINE  Name: Bryan Hubbard MRN: [id number] DOB: 06-20-1941    ADMISSION DATE:  04/08/2014 CONSULTATION DATE:  03/31/2014  REFERRING MD :  Thereasa Solo  PRIMARY SERVICE:  TRH  CHIEF COMPLAINT:  Hypoxemic respiratory failure  BRIEF PATIENT DESCRIPTION: [name] with PMH of Goodpastures - O2 dependent, presents with worsening SOB, dizziness, weakness and LE swelling.  Cards consulted and did not feel that SOB was due to a primary cardiac reason.  PCCM was consulted for further evaluation.  SIGNIFICANT EVENTS / STUDIES:  3/27 TTE >>> PAP 45 torr 7/3   Chest CT >>> bilateral interstitial lung abnormality without nodules or consolidation, no effusion  7/7   VQ scan >>> intermediate probability of PE, new acute perfusion defect LUL since prior VQ in 12/2013  7/7   PFT >>> restrictive pattern, severe diffusion impairment, significant bronchodilator responce  7/8   Venous duplex >>> no clot  7/24 DC cardioversion 7/27 CT chest high resolution> worsening traction bronchiectasis and diffuse GGO worrisome for pneumonitis/Goodpasture syndrome 7/28 plasmapheresis  LINES / TUBES:  CULTURES: Urine 7/22 >> coag neg staph (likely contaminant) Blood 7/22 >>   ANTIBIOTICS:  INTERVAL HISTORY:   Feels better today, good spirits, no cough or mucus production  VITAL SIGNS: Temp:  [97.3 F (36.3 C)-98.1 F (36.7 C)] 97.4 F (36.3 C) (07/29 0306) Pulse Rate:  [64-71] 64 (07/29 0639) Resp:  [13-29] 22 (07/29 1000) BP: (86-116)/(57-84) 109/66 mmHg (07/29 1000) SpO2:  [91 %-100 %] 92 % (07/29 1003) FiO2 (%):  [50 %-60 %] 50 % (07/29 1003) Weight:  [78.518 kg (173 lb 1.6 oz)] 78.518 kg (173 lb 1.6 oz) (07/29 0306)  HEMODYNAMICS:   VENTILATOR SETTINGS: Vent Mode:  [-]  FiO2 (%):  [50 %-60 %] 50 %  INTAKE / OUTPUT: Intake/Output     07/28 0701 - 07/29 0700 07/29 0701 - 07/30 0700   P.O. 240    I.V. (mL/kg) 1525 (19.4) 300 (3.8)   IV Piggyback 790    Total  Intake(mL/kg) 2555 (32.5) 300 (3.8)   Urine (mL/kg/hr) 1325 (0.7)    Total Output 1325     Net +1230 +300        Stool Occurrence  1 x    PHYSICAL EXAMINATION: General: no acute distress HEENT: NCAT, high flow O2 mask, OP clear PULM: crackles in bases bilaterally CV: RRR, no mgr, no JVD AB: BS+, soft, nontender Ex: edema R leg > L Neuro: A&Ox4, MAEW  LABS: I have reviewed all of today's lab results. Relevant abnormalities are discussed in the A/P section  CXR: NNF ASSESSMENT / PLAN:  Acute hypoxemic respiratory failure due to: -Goodpasture syndrome ? > anti GBM ab negative, and apparently recurrence of this problem is rare; so to broaden ddx we would consider inflammatory conditions such as NSIP, acute interstitial pneumonitis, organizing pneumonia, or vasculitis.  This does not appear infectious as no cough or fever which we would expect with even atypical infections like PCP or CMV.  A bronchoscopy would require intubation and would only tell us if there is DAH (which is incredibly non-specific and would not change management) but would give [place] the benefit of a PCP stain.  The only real way to know what is going on in his lungs would be to perform a VATS which would be very risky. -PE -pulmonary edema (resolved)  At this point, considering a slight improvement and known history of Ab mediated process which responded to plasmapheresis, I favor  continued plasmapheresis this week with prednisone and cytoxan.  If no improvement then we can discuss intubation for bronch or VATS. Wean O2 for O2 saturation > 92% Use high flow oxygen Bactrim for pcp prophylaxis  AKI > improving Per renal/TRH Monitor BMET  AFRVR > SR s/p DCCV, Cards managing Possible component of edema Chronic diastolic heart failure Mgmt per Cards   Roselie Awkward, MD [place] [facility] Pager: [phone] Cell: [phone] If no response, call [phone]   04/25/2014, 11:37 AM

## 2014-04-26 LAB — OCCULT BLOOD X 1 CARD TO LAB, STOOL: FECAL OCCULT BLD: POSITIVE — AB

## 2014-04-26 LAB — BASIC METABOLIC PANEL
Anion gap: 16 — ABNORMAL HIGH (ref 5–15)
BUN: 98 mg/dL — AB (ref 6–23)
CALCIUM: 8.6 mg/dL (ref 8.4–10.5)
CO2: 25 mEq/L (ref 19–32)
Chloride: 96 mEq/L (ref 96–112)
Creatinine, Ser: 1.77 mg/dL — ABNORMAL HIGH (ref 0.50–1.35)
GFR calc Af Amer: 42 mL/min — ABNORMAL LOW (ref >90)
GFR calc non Af Amer: 36 mL/min — ABNORMAL LOW (ref >90)
GLUCOSE: 139 mg/dL — AB (ref 70–99)
POTASSIUM: 4.2 meq/L (ref 3.7–5.3)
SODIUM: 137 meq/L (ref 137–147)

## 2014-04-26 LAB — PROTIME-INR
INR: 1.62 — ABNORMAL HIGH (ref 0.00–1.49)
Prothrombin Time: 19.2 seconds — ABNORMAL HIGH (ref 11.6–15.2)

## 2014-04-26 LAB — CBC
HCT: 24.8 % — ABNORMAL LOW (ref 39.0–52.0)
Hemoglobin: 8.3 g/dL — ABNORMAL LOW (ref 13.0–17.0)
MCH: 32 pg (ref 26.0–34.0)
MCHC: 33.5 g/dL (ref 30.0–36.0)
MCV: 95.8 fL (ref 78.0–100.0)
PLATELETS: DECREASED 10*3/uL (ref 150–400)
RBC: 2.59 MIL/uL — AB (ref 4.22–5.81)
RDW: 20.2 % — AB (ref 11.5–15.5)
WBC: 5.4 10*3/uL (ref 4.0–10.5)

## 2014-04-26 LAB — APTT: aPTT: 29 seconds (ref 24–37)

## 2014-04-26 MED ORDER — TRAMADOL-ACETAMINOPHEN 37.5-325 MG PO TABS
1.0000 | ORAL_TABLET | Freq: Four times a day (QID) | ORAL | Status: DC | PRN
  Administered 2014-04-26 – 2014-04-29 (×4): 1 via ORAL
  Administered 2014-04-30 (×2): 2 via ORAL
  Administered 2014-05-01 (×2): 1 via ORAL
  Administered 2014-05-02 (×3): 2 via ORAL
  Administered 2014-05-02: 1 via ORAL
  Administered 2014-05-03 – 2014-05-05 (×4): 2 via ORAL
  Administered 2014-05-05: 1 via ORAL
  Filled 2014-04-26 (×2): qty 2
  Filled 2014-04-26: qty 1
  Filled 2014-04-26: qty 2
  Filled 2014-04-26 (×2): qty 1
  Filled 2014-04-26: qty 2
  Filled 2014-04-26: qty 1
  Filled 2014-04-26: qty 2
  Filled 2014-04-26: qty 1
  Filled 2014-04-26 (×3): qty 2
  Filled 2014-04-26: qty 1
  Filled 2014-04-26: qty 2
  Filled 2014-04-26: qty 1
  Filled 2014-04-26: qty 2
  Filled 2014-04-26: qty 1

## 2014-04-26 MED ORDER — POLYETHYLENE GLYCOL 3350 17 G PO PACK
17.0000 g | PACK | Freq: Every day | ORAL | Status: DC | PRN
  Administered 2014-04-26: 17 g via ORAL
  Filled 2014-04-26: qty 1

## 2014-04-26 MED ORDER — SODIUM CHLORIDE 0.9 % IV SOLN
INTRAVENOUS | Status: AC
  Filled 2014-04-26 (×4): qty 200

## 2014-04-26 MED ORDER — MAGNESIUM HYDROXIDE 400 MG/5ML PO SUSP
15.0000 mL | Freq: Every day | ORAL | Status: DC | PRN
  Administered 2014-04-26: 15 mL via ORAL
  Filled 2014-04-26: qty 30

## 2014-04-26 MED ORDER — SODIUM CHLORIDE 0.9 % IV SOLN
250.0000 mg | Freq: Two times a day (BID) | INTRAVENOUS | Status: AC
  Administered 2014-04-26 – 2014-04-28 (×4): 250 mg via INTRAVENOUS
  Filled 2014-04-26 (×4): qty 2

## 2014-04-26 MED ORDER — DOCUSATE SODIUM 100 MG PO CAPS
100.0000 mg | ORAL_CAPSULE | Freq: Two times a day (BID) | ORAL | Status: DC | PRN
  Administered 2014-04-26 – 2014-05-04 (×3): 100 mg via ORAL
  Filled 2014-04-26 (×3): qty 1

## 2014-04-26 MED ORDER — BISACODYL 10 MG RE SUPP: 10.0000 mg | Freq: Every day | RECTAL | Status: DC | PRN

## 2014-04-26 MED ORDER — CALCIUM GLUCONATE 10 % IV SOLN
4.0000 g | Freq: Once | INTRAVENOUS | Status: DC
  Filled 2014-04-26: qty 40

## 2014-04-26 MED ORDER — SULFAMETHOXAZOLE-TMP DS 800-160 MG PO TABS
2.5000 | ORAL_TABLET | Freq: Three times a day (TID) | ORAL | Status: DC
Start: 2014-04-26 — End: 2014-04-28
  Administered 2014-04-26 – 2014-04-28 (×6): 2.5 via ORAL
  Filled 2014-04-26 (×10): qty 2.5

## 2014-04-26 NOTE — Progress Notes (Signed)
[facility]  Because we are increasing solumedrol dose, will start PCP treatment empirically as patient prefers to not undergo bronchoscopy for diagnosis as that would require intubation.  Discussed with Dr. Posey Pronto who agrees.  Will watch renal function carefully.  Roselie Awkward, MD [place] [facility] Pager: [phone] Cell: [phone] If no response, call [phone]

## 2014-04-26 NOTE — Progress Notes (Signed)
MD notified of dark red blood around rectum and drop in Hgb from 10.0 to 9.0 on 7/28 to 7/29. Awaiting orders. Will continue to monitor.

## 2014-04-26 NOTE — Progress Notes (Signed)
[name]  Bryan Hubbard HUT:[id number] DOB: Aug 30, 1941 DOA: 04/02/2014 PCP: [name], MD  Brief narrative: 73 yo M Hx chronic atrial flutter, DVT, Goodpasture's, and recent oxygen dependent respiratory failure after hospitalization for community acquired pneumonia last month who presented to the hospital with progressive shortness of breath and lower extremity edema. In addition he was becoming very dizzy and very weak with standing. He notified his cardiologist of these symptoms several days prior to presentation and was instructed to decrease his metoprolol to 25 mg by mouth twice a day. Unfortunately his symptoms did not improve.  He presented to the hospital where it was noted that his systolic blood pressure was in the upper 80s and upon standing dropped into the 70s. Review of patient medication revealed he was taking Lasix 20 mg daily and had continued to take this medication prior to presentation. In addition he endorsed having to increase his oxygen at home from 2 L to 4 L because of progressive shortness of breath. A two-view chest x-ray demonstrated acutely worsened diffuse bilateral airspace opacification with a peripheral distribution and diffuse increased interstitial markings that could either be acute infectious or inflammatory interstitial pneumonitis.   HPI/Subjective: Alert, says respiratory symptoms not worse but not better- quite concerned over prognosis  Assessment/Plan:  Atrial flutter with rapid ventricular response-maintaining NSR -as per Cardiology -they will follow at a distance since has been stable -Initially failed attempts to use cardizem, BB, or both to control RVR -Ibutilide infusion was unsuccessful > DCCV successful under bedside anesthesia -maintaining NSR at this time  -since had prolonged RVR we checked ECHO which demonstrated a decrease in systolic function from 65-68% to 40-45% -Eliquis remains on hold for  now- initially due to expected clotting issues with plasmapheresis but now holding to ensure no further melena  Goodpasture's -chronically treated with steroid, oral cytoxan and prn plasmapheresis  -Initially it was unclear if respiratory symptoms were a manifestation of Goodpasture's or primarily related to atrial flutter with RVR-after converted to NSR and diuresis symptoms unfortunately persisted -per Pulmonary chest x-ray was consistent with interstitial lung disease of uncertain etiology - due to current respiratory compromise unable to proceed with biopsy to help clarify diagnosis-Did receive pulse steroids at higher dose for several days without improvement in respiratory sx's  -preadmission immunosuppressants were continued -Patient subsequently underwent plasmapheresis catheter insertion 7/28 by IR and began plasmapheresis (see below) but serologies were negative for Goodpastures etiology so plasmapheresis dcd 7/30  Acute on chronic respiratory failure with hypoxia:   A) ? Acute diastolic congestive heart failure exacerbation    B) ? Pulmonary Goodpasture's    C) ? Opportunistic infection -Initially attempted to gently diurese but unfortunately only achieved worsened renal function without any improvement in respiratory status  -High resolution CT Chest 7/27 ordered by pulmonary with findings c/w Goodpastures and plasmapheresis initiated 7/28 and as of 7/29 respiratory sx's appear to be improving -on PCP prophylaxis w/ Bactrim per Pulmonary and given no evidence of pulmonary Goodpastures in setting on idipathic ILD pulmonary MD adjusting dose to full treatment  -7/30 pulmonary also increasing steroid dose again- pt reluctant to undergo bronchoscopy given likely need for intubation and mech ventilation -Pulmonary also concerned over cytoxan mediated fibrosis so Cytoxan was dc'd today (7/30) -will attempt to wean oxygen -please see pulmonary notes for full details  Glomerulonephritis  /Acute kidney Injury on Chronic kidney disease stage 3, GFR 30-59 ml/min -Fnx had worsened somewhat on 7/27  so Nephro consulted -markedly worse on 7/28 with BUN 127 and Scr 2.56-suspected over diuresis so Lasix dc'd -Nephrology added Bicarb infusion 7/28 -function slowly improving   Transient melena -occurred overnight and suspect due to lower coags after plasmapheresis -platelets are clumped -if bleeding resumes may need to replace clotting factors/platelets but suspect will resolve with dc of plasmapheresis   Hypoxemic mediated anxiety -Klonopin has helped significantly with sx's  Abnormal UA -cx positive only for 20,000 colonies of coag neg staph - further tx not currently indicated   Hematuria (recurrent) -pt states this is a recurring problem which has improve w/ his use of Jalyn - BP improved so was resumed 7/26 - urine clearing as of 7/27  Anemia of chronic disease -Hemoglobin stable - baseline between 7.9 and 9   Thrombocytopenia -follow closely - resolving slowly and greater than 100,000   HYPERLIPIDEMIA  HYPERTENSION -BP well controlled  History of DVT / PE / IVC filter -was transitioned from warfarin to eliquis during the last hospitalization at recommendation of the Pulmonologist in setting of inability to maintain therapeutic INR on warfarin  DVT prophylaxis: SCDs Code Status: Full Family Communication: spoke w/ pt at length   Disposition Plan/Expected LOS: Step down  Consultants: Cardiology Pulmonology Nephrology  IR   Procedures: 7/24 - bedside DCCV 7/28- Trialysis catheter 7/28- ECHO pending  Positive Cultures: Urine culture coag neg staph  Antibiotics: None  Objective: Blood pressure 110/53, pulse 76, temperature 97.3 F (36.3 C), temperature source Oral, resp. rate 17, height 5\' 10"  (1.778 m), weight 180 lb 11.2 oz (81.965 kg), SpO2 93.00%.  Intake/Output Summary (Last 24 hours) at 04/26/14 1427 Last data filed at 04/26/14 0900  Gross  per 24 hour  Intake   2190 ml  Output   1550 ml  Net    640 ml   Exam: General: No acute respiratory distress at rest Lungs: Diminished throughout with fine diffuse crakles - no wheeze - now on high flow oxygen cannula w/ humidification Cardiovascular: regular rate and sinus rhythm presently w/o gallup or rub  Abdomen: Nontender, nondistended, soft, bowel sounds positive, no rebound, no ascites, no appreciable mass Musculoskeletal: No significant cyanosis, clubbing of bilateral lower extremities - trace B LE edema   Scheduled Meds:  Scheduled Meds: . antiseptic oral rinse  15 mL Mouth Rinse BID  . calcium gluconate IVPB  4 g Intravenous Once  . clonazePAM  0.25 mg Oral BID  . [START ON 05/10/2014] cyanocobalamin  1,000 mcg Subcutaneous Q30 days  . cyclobenzaprine  5 mg Oral QHS  . darbepoetin (ARANESP) injection - NON-DIALYSIS  60 mcg Subcutaneous Q Wed-1800  . tamsulosin  0.4 mg Oral Daily   And  . dutasteride  0.5 mg Oral Daily  . latanoprost  1 drop Both Eyes q morning - 10a  . levalbuterol  1.25 mg Nebulization 4 times per day  . methylPREDNISolone (SOLU-MEDROL) injection  250 mg Intravenous Q12H  . metoprolol tartrate  25 mg Oral 4 times per day  . montelukast  10 mg Oral QHS  . pantoprazole  40 mg Oral Q1200  . sulfamethoxazole-trimethoprim  2.5 tablet Oral 3 times per day   Data Reviewed: Basic Metabolic Panel:  Recent Labs Lab 04/14/2014 1140  04/23/14 0020 04/24/14 04/24/14 1402 04/24/14 1408 04/25/14 0236 04/26/14 0251  NA  --   < > 135* 131* 133* 128* 137 137  K  --   < > 5.0 5.8* 5.1 5.0 4.2 4.2  CL  --   < >  93* 90* 90* 95* 94* 96  CO2  --   < > 22 16* 22  --  22 25  GLUCOSE  --   < > 152* 170* 136* 136* 180* 139*  BUN  --   < > 94* 127* 135* >140* 120* 98*  CREATININE  --   < > 2.06* 2.56* 2.47* 3.30* 2.15* 1.77*  CALCIUM  --   < > 9.6 9.4 9.1  --  8.9 8.6  MG 2.2  --   --   --   --   --   --   --   < > = values in this interval not displayed. Liver  Function Tests: No results found for this basename: AST, ALT, ALKPHOS, BILITOT, PROT, ALBUMIN,  in the last 168 hours  CBC:  Recent Labs Lab 04/21/14 0245 04/23/14 0020 04/24/14 0930 04/24/14 1408 04/25/14 0236 04/26/14 0251  WBC 3.6* 4.6 5.1  --  4.7 5.4  HGB 8.7* 9.8* 10.0* 10.2* 9.0* 8.3*  HCT 26.0* 30.1* 30.6* 30.0* 26.8* 24.8*  MCV 97.0 97.4 98.4  --  95.4 95.8  PLT 131* 137* 156  --  119* PLATELET CLUMPS NOTED ON SMEAR, COUNT APPEARS DECREASED   Cardiac Enzymes: No results found for this basename: CKTOTAL, CKMB, CKMBINDEX, TROPONINI,  in the last 168 hours BNP (last 3 results)  Recent Labs  03/05/14 0938 03/30/14 2015 04/10/2014 2027  PROBNP 573.0* 7170.0* 6768.0*   Studies:  Recent x-ray studies have been reviewed in detail by the Attending Physician  Time spent :  24mins  Allison Ellis, ANP [facility] For Consults/Admissions - Flow Manager - [phone] Office  [phone] Pager [phone]  On-Call/Text Page:      [email or web address]      password [facility]  04/26/2014, 2:27 PM   LOS: 9 days        Patient seen and examined at bedside. Agree with the above assessment and plan.  Patient did not have any bloody bowel movements or malena. His hemoglobin has dropped to 8.3 from 10.2. His stool for occult blood is positive.INR IS 1.62 with PTT of 29. Recommend follow up of his hemoglobin. PT/OT EVAL.   Hosie Poisson, MD [phone]

## 2014-04-26 NOTE — Progress Notes (Signed)
PULMONARY / CRITICAL CARE MEDICINE  Name: Bryan Hubbard MRN: [id number] DOB: 03-29-1941    ADMISSION DATE:  04/15/2014 CONSULTATION DATE:  03/31/2014  REFERRING MD :  Thereasa Solo  PRIMARY SERVICE:  TRH  CHIEF COMPLAINT:  Hypoxemic respiratory failure  BRIEF PATIENT DESCRIPTION: [name] with PMH of Goodpastures - O2 dependent, presents with worsening SOB, dizziness, weakness and LE swelling.  Cards consulted and did not feel that SOB was due to a primary cardiac reason.  PCCM was consulted for further evaluation.  SIGNIFICANT EVENTS / STUDIES:  3/27 TTE >>> PAP 45 torr 7/3   Chest CT >>> bilateral interstitial lung abnormality without nodules or consolidation, no effusion  7/7   VQ scan >>> intermediate probability of PE, new acute perfusion defect LUL since prior VQ in 12/2013  7/7   PFT >>> restrictive pattern, severe diffusion impairment, significant bronchodilator responce  7/8   Venous duplex >>> no clot  7/24 DC cardioversion 7/27 CT chest high resolution> worsening traction bronchiectasis and diffuse GGO worrisome for pneumonitis/Goodpasture syndrome 7/28 plasmapheresis x2 sessions through 7/29 7/29 Anti GBM, mpo, pr3 ab all negative  LINES / TUBES:  CULTURES: Urine 7/22 >> coag neg staph (likely contaminant) Blood 7/22 >>   ANTIBIOTICS:  INTERVAL HISTORY:   Feels about the same today  VITAL SIGNS: Temp:  [97.3 F (36.3 C)-98 F (36.7 C)] 97.3 F (36.3 C) (07/30 1246) Pulse Rate:  [61-74] 68 (07/30 1242) Resp:  [11-23] 17 (07/30 1242) BP: (83-110)/(44-65) 110/53 mmHg (07/30 1242) SpO2:  [89 %-100 %] 93 % (07/30 1242) FiO2 (%):  [60 %] 60 % (07/30 1242) Weight:  [81.965 kg (180 lb 11.2 oz)] 81.965 kg (180 lb 11.2 oz) (07/30 0332)  HEMODYNAMICS:   VENTILATOR SETTINGS: Vent Mode:  [-]  FiO2 (%):  [60 %] 60 %  INTAKE / OUTPUT: Intake/Output     07/29 0701 - 07/30 0700 07/30 0701 - 07/31 0700   P.O. 660    I.V. (mL/kg) 1725 (21) 150 (1.8)   IV Piggyback 250    Total Intake(mL/kg) 2635 (32.1) 150 (1.8)   Urine (mL/kg/hr) 1550 (0.8)    Total Output 1550     Net +1085 +150        Stool Occurrence 1 x     PHYSICAL EXAMINATION: General: no acute distress HEENT: NCAT, high flow O2 mask, OP clear PULM: crackles in bases bilaterally CV: RRR, no mgr, no JVD AB: BS+, soft, nontender Ex: edema R leg > L Neuro: A&Ox4, MAEW  LABS: I have reviewed all of today's lab results. Relevant abnormalities are discussed in the A/P section  CXR: NNF ASSESSMENT / PLAN:  Acute hypoxemic respiratory failure due to: -ILD > feel at this point Goodpasture or Vasculitis unlikely given normal antibody panel; Have discussed with radiology again who feels that this is a non-specific pattern suggestive of inflammation and progressive fibrosis.  I am concerned that this may be cytoxan mediated fibrosis.  If this were an alternative inflammatory condition (NSIP, Acute interstitial pneumonitis) then we would have treated it by now.  Radiographic appearance not suggestive of a malignancy.  Overall picture not consistent with infectious process.   -PE, s/p IVC filter -pulmonary edema (resolved)  Stop cytoxan Increase solumedrol again If worse> intubate and perform bronchoscopy Wean O2 for O2 saturation > 92% Use high flow oxygen Bactrim for pcp prophylaxis  AKI > improving Per renal/TRH Monitor BMET  AFRVR > SR s/p DCCV, Cards managing Possible component of edema Chronic diastolic  heart failure If bleeding from rectum resolves, I am OK with restarting anticoagulation   Roselie Awkward, MD [facility] Pager: [phone] Cell: [phone] If no response, call [phone]   04/26/2014, 12:53 PM

## 2014-04-26 NOTE — Progress Notes (Signed)
[facility] ROUNDING NOTE   Subjective:   Interval History: comfortable about same condition  Objective:  Vital signs in last 24 hours:  Temp:  [97.3 F (36.3 C)-98 F (36.7 C)] 97.5 F (36.4 C) (07/30 0800) Pulse Rate:  [61-96] 65 (07/30 0817) Resp:  [11-23] 15 (07/30 0817) BP: (83-408)/(44-65) 104/62 mmHg (07/30 0817) SpO2:  [89 %-100 %] 95 % (07/30 0901) FiO2 (%):  [50 %-60 %] 60 % (07/30 0901) Weight:  [81.965 kg (180 lb 11.2 oz)] 81.965 kg (180 lb 11.2 oz) (07/30 0332)  Weight change: 3.447 kg (7 lb 9.6 oz) Filed Weights   04/24/14 0331 04/25/14 0306 04/26/14 0332  Weight: 77 kg (169 lb 12.1 oz) 78.518 kg (173 lb 1.6 oz) 81.965 kg (180 lb 11.2 oz)    Intake/Output: I/O last 3 completed shifts: In: 6283 [P.O.:660; I.V.:2625; IV Piggyback:250] Out: 2450 [Urine:2450]   Intake/Output this shift:  Total I/O In: 150 [I.V.:150] Out: -   General: ill appering in mild respiratory distress.  Head: Normocephalic and atraumatic.  Neck: JVD flat  Lungs: fine Crackles dependently  Heart: RRR, no murmur  Abdomen: soft, non-tender, not distended  Extremities: 1+ ankle edema     Basic Metabolic Panel:  Recent Labs Lab 04/23/2014 1140  04/23/14 0020 04/24/14 04/24/14 1402 04/24/14 1408 04/25/14 0236 04/26/14 0251  NA  --   < > 135* 131* 133* 128* 137 137  K  --   < > 5.0 5.8* 5.1 5.0 4.2 4.2  CL  --   < > 93* 90* 90* 95* 94* 96  CO2  --   < > 22 16* 22  --  22 25  GLUCOSE  --   < > 152* 170* 136* 136* 180* 139*  BUN  --   < > 94* 127* 135* >140* 120* 98*  CREATININE  --   < > 2.06* 2.56* 2.47* 3.30* 2.15* 1.77*  CALCIUM  --   < > 9.6 9.4 9.1  --  8.9 8.6  MG 2.2  --   --   --   --   --   --   --   < > = values in this interval not displayed.  Liver Function Tests: No results found for this basename: AST, ALT, ALKPHOS, BILITOT, PROT, ALBUMIN,  in the last 168 hours No results found for this basename: LIPASE, AMYLASE,  in the last 168 hours No  results found for this basename: AMMONIA,  in the last 168 hours  CBC:  Recent Labs Lab 04/21/14 0245 04/23/14 0020 04/24/14 0930 04/24/14 1408 04/25/14 0236 04/26/14 0251  WBC 3.6* 4.6 5.1  --  4.7 5.4  HGB 8.7* 9.8* 10.0* 10.2* 9.0* 8.3*  HCT 26.0* 30.1* 30.6* 30.0* 26.8* 24.8*  MCV 97.0 97.4 98.4  --  95.4 95.8  PLT 131* 137* 156  --  119* PLATELET CLUMPS NOTED ON SMEAR, COUNT APPEARS DECREASED    Cardiac Enzymes: No results found for this basename: CKTOTAL, CKMB, CKMBINDEX, TROPONINI,  in the last 168 hours  BNP: No components found with this basename: POCBNP,   CBG:  Recent Labs Lab 04/24/14 0813  GLUCAP 101*    Microbiology: Results for orders placed during the hospital encounter of 04/21/2014  CULTURE, BLOOD (ROUTINE X 2)     Status: None   Collection Time    04/18/14  8:35 AM      Result Value Ref Range Status   Specimen Description BLOOD RIGHT ANTECUBITAL   Final  Special Requests BOTTLES DRAWN AEROBIC AND ANAEROBIC 10CC   Final   Culture  Setup Time     Final   Value: 04/18/2014 13:03     Performed at [facility]   Culture     Final   Value: NO GROWTH 5 DAYS     Performed at [facility]   Report Status 04/24/2014 FINAL   Final  CULTURE, BLOOD (ROUTINE X 2)     Status: None   Collection Time    04/18/14  8:50 AM      Result Value Ref Range Status   Specimen Description BLOOD LEFT HAND   Final   Special Requests BOTTLES DRAWN AEROBIC AND ANAEROBIC 5CC   Final   Culture  Setup Time     Final   Value: 04/18/2014 13:02     Performed at [facility]   Culture     Final   Value: NO GROWTH 5 DAYS     Performed at [facility]   Report Status 04/24/2014 FINAL   Final  URINE CULTURE     Status: None   Collection Time    04/18/14 10:12 AM      Result Value Ref Range Status   Specimen Description URINE, CLEAN CATCH   Final   Special Requests NONE   Final   Culture  Setup Time     Final   Value: 04/18/2014 15:33      Performed at [place]     Final   Value: 20,OOO COLONIES/ML     Performed at [facility]   Culture     Final   Value: STAPHYLOCOCCUS SPECIES (COAGULASE NEGATIVE)     Note: RIFAMPIN AND GENTAMICIN SHOULD NOT BE USED AS SINGLE DRUGS FOR TREATMENT OF STAPH INFECTIONS.     Performed at [facility]   Report Status 04/07/2014 FINAL   Final   Organism ID, Bacteria STAPHYLOCOCCUS SPECIES (COAGULASE NEGATIVE)   Final    Coagulation Studies:  Recent Labs  04/26/14 0251  LABPROT 19.2*  INR 1.62*    Urinalysis: No results found for this basename: COLORURINE, APPERANCEUR, LABSPEC, PHURINE, GLUCOSEU, HGBUR, BILIRUBINUR, KETONESUR, PROTEINUR, UROBILINOGEN, NITRITE, LEUKOCYTESUR,  in the last 72 hours    Imaging: Dg Chest 2 View  04/25/2014   CLINICAL DATA:  Acute hypoxemia, respiratory failure  EXAM: CHEST  2 VIEW  COMPARISON:  04/22/2014  FINDINGS: Cardiomediastinal silhouette is stable. Chronic interstitial prominence again noted without convincing pulmonary edema. Right IJ Port-A-Cath is unchanged in position. Fibrotic changes lung bases again noted. No definite superimposed infiltrate or PET Stable degenerative changes thoracic spine.  IMPRESSION: No definite superimposed infiltrate or pleural effusion. Chronic interstitial prominence without convincing pulmonary edema. Stable fibrotic changes lung bases.   Electronically Signed   By: Lahoma Crocker M.D.   On: 04/25/2014 17:10     Medications:     . therapeutic plasma exchange solution   Dialysis Q1 Hr x 4  . antiseptic oral rinse  15 mL Mouth Rinse BID  . calcium gluconate IVPB  4 g Intravenous Once  . clonazePAM  0.25 mg Oral BID  . [START ON 05/10/2014] cyanocobalamin  1,000 mcg Subcutaneous Q30 days  . cyclobenzaprine  5 mg Oral QHS  . darbepoetin (ARANESP) injection - NON-DIALYSIS  60 mcg Subcutaneous Q Wed-1800  . tamsulosin  0.4 mg Oral Daily   And  . dutasteride  0.5 mg Oral Daily  .  latanoprost  1 drop Both Eyes q morning - 10a  . levalbuterol  1.25 mg Nebulization 4 times per day  . metoprolol tartrate  25 mg Oral 4 times per day  . montelukast  10 mg Oral QHS  . pantoprazole  40 mg Oral Q1200  . predniSONE  60 mg Oral Q breakfast  . sulfamethoxazole-trimethoprim  1 tablet Oral Daily   acetaminophen, bisacodyl, docusate sodium, levalbuterol, magnesium hydroxide, metoprolol, ondansetron (ZOFRAN) IV, polyethylene glycol, sodium chloride  Assessment/ Plan:  This is a very unfortunate man with multiple reasons to be dyspneic. Congestive heart failure, pulmonary edema and interstitial lung disease and possible pneumonia. He has been immunosuppressed since his initial diagnosis of Goodpastures and has had consistently negative serologies. I have discussed the case with Dr Posey Pronto who has been closely monitoring the patient for progression. His renal function has been stable although he has had persistent hematuria.   1The acute renal failure appears related to a drop in blood pressure and hypoperfusion of his kidneys - improved   2Anti GBM negative Antibody titer and Anti MPO is also negative This would make active disease unlikely . Will stop pheresis  3Hyperkalemia with metabolic acidosis improved stop bicarbonate     LOS: 9 Bryan Hubbard W @TODAY @11 :46 AM

## 2014-04-26 NOTE — Progress Notes (Signed)
ANTIBIOTIC CONSULT NOTE - INITIAL  Pharmacy Consult for septra Indication: Pneumocystis pneumonia  No Known Allergies  Patient Measurements: Height: 5\' 10"  (177.8 cm) Weight: 180 lb 11.2 oz (81.965 kg) IBW/kg (Calculated) : 73   Vital Signs: Temp: 97.3 F (36.3 C) (07/30 1246) Temp src: Oral (07/30 1246) BP: 110/53 mmHg (07/30 1315) Pulse Rate: 76 (07/30 1315) Intake/Output from previous day: 07/29 0701 - 07/30 0700 In: 2635 [P.O.:660; I.V.:1725; IV Piggyback:250] Out: 1550 [Urine:1550] Intake/Output from this shift: Total I/O In: 630 [P.O.:480; I.V.:150] Out: -   Labs:  Recent Labs  04/24/14 0930  04/24/14 1408 04/25/14 0236 04/26/14 0251  WBC 5.1  --   --  4.7 5.4  HGB 10.0*  --  10.2* 9.0* 8.3*  PLT 156  --   --  119* PLATELET CLUMPS NOTED ON SMEAR, COUNT APPEARS DECREASED  CREATININE  --   < > 3.30* 2.15* 1.77*  < > = values in this interval not displayed. Estimated Creatinine Clearance: 37.8 ml/min (by C-G formula based on Cr of 1.77). No results found for this basename: VANCOTROUGH, VANCOPEAK, VANCORANDOM, GENTTROUGH, GENTPEAK, GENTRANDOM, TOBRATROUGH, TOBRAPEAK, TOBRARND, AMIKACINPEAK, AMIKACINTROU, AMIKACIN,  in the last 72 hours   Microbiology: Recent Results (from the past 720 hour(s))  CULTURE, BLOOD (ROUTINE X 2)     Status: None   Collection Time    03/30/14  8:15 PM      Result Value Ref Range Status   Specimen Description BLOOD LEFT ANTECUBITAL   Final   Special Requests BOTTLES DRAWN AEROBIC AND ANAEROBIC 3CC   Final   Culture  Setup Time     Final   Value: 03/31/2014 02:17     Performed at [facility]   Culture     Final   Value: NO GROWTH 5 DAYS     Performed at [facility]   Report Status 04/06/2014 FINAL   Final  CULTURE, BLOOD (ROUTINE X 2)     Status: None   Collection Time    03/30/14  8:20 PM      Result Value Ref Range Status   Specimen Description BLOOD RIGHT FOREARM   Final   Special Requests BOTTLES DRAWN  AEROBIC AND ANAEROBIC 5CC   Final   Culture  Setup Time     Final   Value: 03/31/2014 02:19     Performed at [facility]   Culture     Final   Value: NO GROWTH 5 DAYS     Performed at [facility]   Report Status 04/06/2014 FINAL   Final  URINE CULTURE     Status: None   Collection Time    03/30/14  9:00 PM      Result Value Ref Range Status   Specimen Description URINE, CLEAN CATCH   Final   Special Requests NONE   Final   Culture  Setup Time     Final   Value: 03/31/2014 02:21     Performed at [place]     Final   Value: NO GROWTH     Performed at [facility]   Culture     Final   Value: NO GROWTH     Performed at [facility]   Report Status 04/01/2014 FINAL   Final  MRSA PCR SCREENING     Status: None   Collection Time    03/31/14 12:49 AM      Result Value Ref Range Status  MRSA by PCR NEGATIVE  NEGATIVE Final   Comment:            The GeneXpert MRSA Assay (FDA     approved for NASAL specimens     only), is one component of a     comprehensive MRSA colonization     surveillance program. It is not     intended to diagnose MRSA     infection nor to guide or     monitor treatment for     MRSA infections.  CULTURE, BLOOD (ROUTINE X 2)     Status: None   Collection Time    04/18/14  8:35 AM      Result Value Ref Range Status   Specimen Description BLOOD RIGHT ANTECUBITAL   Final   Special Requests BOTTLES DRAWN AEROBIC AND ANAEROBIC 10CC   Final   Culture  Setup Time     Final   Value: 04/18/2014 13:03     Performed at [facility]   Culture     Final   Value: NO GROWTH 5 DAYS     Performed at [facility]   Report Status 04/24/2014 FINAL   Final  CULTURE, BLOOD (ROUTINE X 2)     Status: None   Collection Time    04/18/14  8:50 AM      Result Value Ref Range Status   Specimen Description BLOOD LEFT HAND   Final   Special Requests BOTTLES DRAWN AEROBIC AND ANAEROBIC 5CC   Final    Culture  Setup Time     Final   Value: 04/18/2014 13:02     Performed at [facility]   Culture     Final   Value: NO GROWTH 5 DAYS     Performed at [facility]   Report Status 04/24/2014 FINAL   Final  URINE CULTURE     Status: None   Collection Time    04/18/14 10:12 AM      Result Value Ref Range Status   Specimen Description URINE, CLEAN CATCH   Final   Special Requests NONE   Final   Culture  Setup Time     Final   Value: 04/18/2014 15:33     Performed at [place]     Final   Value: 20,OOO COLONIES/ML     Performed at [facility]   Culture     Final   Value: STAPHYLOCOCCUS SPECIES (COAGULASE NEGATIVE)     Note: RIFAMPIN AND GENTAMICIN SHOULD NOT BE USED AS SINGLE DRUGS FOR TREATMENT OF STAPH INFECTIONS.     Performed at [facility]   Report Status 04/18/2014 FINAL   Final   Organism ID, Bacteria STAPHYLOCOCCUS SPECIES (COAGULASE NEGATIVE)   Final    Medical History: Past Medical History  Diagnosis Date  . Atrial flutter started in 2011  . Chronic diastolic heart failure   . DVT   . HYPERLIPIDEMIA   . HYPERTENSION, BENIGN   . ALLERGIC ASTHMA   . Palpitations   . H/O Goodpasture's syndrome     "autoimmune disease; affects kidneys and lungs"  . DVT (deep venous thrombosis) 09/2001    "right groin"  . PE (pulmonary embolism) 09/2001  . CHF (congestive heart failure)   . Anemia   . History of blood transfusion     "related to Goodpasture's syndrome; plasmapheresis process"  . Stomach ulcer   . Arthritis     "hands, knees" (  03/16/2014)  . Gout   . Renal insufficiency     "related to the Goodpasture's"  . Atrial flutter with rapid ventricular response 03/16/2014  . Acute on chronic diastolic HF (heart failure) in combination with rapid a flutter and anemia 03/18/2014  . Ejection fraction    Assessment: 73 year old male who was receiving plasmapheresis for possible Goodpasture syndrome and septra for  pneumocystis pneumonia prophylaxis. Steroid doses have been increased today, will increase to treatment dose septra.   No fevers noted, wbc remains normal at 5.4, renal function continues to improve with scr decreasing from 3.3>>1.77 today.   Goal of Therapy:  Treatment of infection  Plan:  Septra 5mg /kg q8 hours = 2.5 DS tablets TID Follow renal function closely Watch electrolytes  Erin Hearing PharmD., BCPS Clinical Pharmacist Pager [phone] 04/26/2014 2:18 PM

## 2014-04-27 ENCOUNTER — Inpatient Hospital Stay (HOSPITAL_COMMUNITY): Payer: Medicare Other

## 2014-04-27 DIAGNOSIS — K921 Melena: Secondary | ICD-10-CM

## 2014-04-27 DIAGNOSIS — I5031 Acute diastolic (congestive) heart failure: Secondary | ICD-10-CM

## 2014-04-27 LAB — COMPREHENSIVE METABOLIC PANEL
ALBUMIN: 3.7 g/dL (ref 3.5–5.2)
ALT: 10 U/L (ref 0–53)
ANION GAP: 14 (ref 5–15)
AST: 17 U/L (ref 0–37)
Alkaline Phosphatase: 32 U/L — ABNORMAL LOW (ref 39–117)
BUN: 97 mg/dL — ABNORMAL HIGH (ref 6–23)
CALCIUM: 8.7 mg/dL (ref 8.4–10.5)
CO2: 28 mEq/L (ref 19–32)
Chloride: 93 mEq/L — ABNORMAL LOW (ref 96–112)
Creatinine, Ser: 1.86 mg/dL — ABNORMAL HIGH (ref 0.50–1.35)
GFR calc Af Amer: 39 mL/min — ABNORMAL LOW (ref >90)
GFR calc non Af Amer: 34 mL/min — ABNORMAL LOW (ref >90)
GLUCOSE: 175 mg/dL — AB (ref 70–99)
Potassium: 4.8 mEq/L (ref 3.7–5.3)
SODIUM: 135 meq/L — AB (ref 137–147)
TOTAL PROTEIN: 4.7 g/dL — AB (ref 6.0–8.3)
Total Bilirubin: 0.7 mg/dL (ref 0.3–1.2)

## 2014-04-27 LAB — CBC
HCT: 22.4 % — ABNORMAL LOW (ref 39.0–52.0)
HEMOGLOBIN: 7.7 g/dL — AB (ref 13.0–17.0)
MCH: 32.6 pg (ref 26.0–34.0)
MCHC: 34.4 g/dL (ref 30.0–36.0)
MCV: 94.9 fL (ref 78.0–100.0)
Platelets: 85 10*3/uL — ABNORMAL LOW (ref 150–400)
RBC: 2.36 MIL/uL — ABNORMAL LOW (ref 4.22–5.81)
RDW: 20.2 % — AB (ref 11.5–15.5)
WBC: 5 10*3/uL (ref 4.0–10.5)

## 2014-04-27 LAB — POCT I-STAT, CHEM 8
BUN: 121 mg/dL — ABNORMAL HIGH (ref 6–23)
Calcium, Ion: 1.19 mmol/L (ref 1.13–1.30)
Chloride: 91 mEq/L — ABNORMAL LOW (ref 96–112)
Creatinine, Ser: 2.7 mg/dL — ABNORMAL HIGH (ref 0.50–1.35)
Glucose, Bld: 213 mg/dL — ABNORMAL HIGH (ref 70–99)
HCT: 28 % — ABNORMAL LOW (ref 39.0–52.0)
HEMOGLOBIN: 9.5 g/dL — AB (ref 13.0–17.0)
POTASSIUM: 4.4 meq/L (ref 3.7–5.3)
SODIUM: 132 meq/L — AB (ref 137–147)
TCO2: 24 mmol/L (ref 0–100)

## 2014-04-27 LAB — PROTIME-INR
INR: 1.21 (ref 0.00–1.49)
Prothrombin Time: 15.3 seconds — ABNORMAL HIGH (ref 11.6–15.2)

## 2014-04-27 MED ORDER — FUROSEMIDE 10 MG/ML IJ SOLN
40.0000 mg | Freq: Once | INTRAMUSCULAR | Status: AC
  Administered 2014-04-27: 40 mg via INTRAVENOUS

## 2014-04-27 NOTE — Progress Notes (Signed)
[facility] ROUNDING NOTE   Subjective:   Interval History: some hypoxia and hypotension. Good urine output. Discussed pheresis and management with Dr Lake Bells  Objective:  Vital signs in last 24 hours:  Temp:  [97.2 F (36.2 C)-98 F (36.7 C)] 97.5 F (36.4 C) (07/31 1229) Pulse Rate:  [56-101] 101 (07/31 1229) Resp:  [12-22] 22 (07/31 1229) BP: (81-125)/(43-72) 125/72 mmHg (07/31 1229) SpO2:  [84 %-97 %] 88 % (07/31 1229) FiO2 (%):  [60 %] 60 % (07/31 0945) Weight:  [84.369 kg (186 lb)] 84.369 kg (186 lb) (07/31 0300)  Weight change: 2.404 kg (5 lb 4.8 oz) Filed Weights   04/25/14 0306 04/26/14 0332 04/27/14 0300  Weight: 78.518 kg (173 lb 1.6 oz) 81.965 kg (180 lb 11.2 oz) 84.369 kg (186 lb)    Intake/Output: I/O last 3 completed shifts: In: 2427 [P.O.:1200; I.V.:1125; IV Piggyback:102] Out: 2350 [Urine:2350]   Intake/Output this shift:     General: ill appering in mild respiratory distress.  Head: Normocephalic and atraumatic.  Neck: JVD flat  Lungs: fine Crackles dependently  Heart: RRR, no murmur  Abdomen: soft, non-tender, not distended  Extremities no edema    Basic Metabolic Panel:  Recent Labs Lab 04/24/14 04/24/14 1402 04/24/14 1408 04/25/14 0236 04/25/14 1318 04/26/14 0251 04/27/14 0311  NA 131* 133* 128* 137 132* 137 135*  K 5.8* 5.1 5.0 4.2 4.4 4.2 4.8  CL 90* 90* 95* 94* 91* 96 93*  CO2 16* 22  --  22  --  25 28  GLUCOSE 170* 136* 136* 180* 213* 139* 175*  BUN 127* 135* >140* 120* 121* 98* 97*  CREATININE 2.56* 2.47* 3.30* 2.15* 2.70* 1.77* 1.86*  CALCIUM 9.4 9.1  --  8.9  --  8.6 8.7    Liver Function Tests:  Recent Labs Lab 04/27/14 0311  AST 17  ALT 10  ALKPHOS 32*  BILITOT 0.7  PROT 4.7*  ALBUMIN 3.7   No results found for this basename: LIPASE, AMYLASE,  in the last 168 hours No results found for this basename: AMMONIA,  in the last 168 hours  CBC:  Recent Labs Lab 04/23/14 0020 04/24/14 0930  04/24/14 1408 04/25/14 0236 04/25/14 1318 04/26/14 0251 04/27/14 0311  WBC 4.6 5.1  --  4.7  --  5.4 5.0  HGB 9.8* 10.0* 10.2* 9.0* 9.5* 8.3* 7.7*  HCT 30.1* 30.6* 30.0* 26.8* 28.0* 24.8* 22.4*  MCV 97.4 98.4  --  95.4  --  95.8 94.9  PLT 137* 156  --  119*  --  PLATELET CLUMPS NOTED ON SMEAR, COUNT APPEARS DECREASED 85*    Cardiac Enzymes: No results found for this basename: CKTOTAL, CKMB, CKMBINDEX, TROPONINI,  in the last 168 hours  BNP: No components found with this basename: POCBNP,   CBG:  Recent Labs Lab 04/24/14 0813  GLUCAP 101*    Microbiology: Results for orders placed during the hospital encounter of 04/14/2014  CULTURE, BLOOD (ROUTINE X 2)     Status: None   Collection Time    04/18/14  8:35 AM      Result Value Ref Range Status   Specimen Description BLOOD RIGHT ANTECUBITAL   Final   Special Requests BOTTLES DRAWN AEROBIC AND ANAEROBIC 10CC   Final   Culture  Setup Time     Final   Value: 04/18/2014 13:03     Performed at [facility]   Culture     Final   Value: NO GROWTH 5 DAYS  Performed at [facility]   Report Status 04/24/2014 FINAL   Final  CULTURE, BLOOD (ROUTINE X 2)     Status: None   Collection Time    04/18/14  8:50 AM      Result Value Ref Range Status   Specimen Description BLOOD LEFT HAND   Final   Special Requests BOTTLES DRAWN AEROBIC AND ANAEROBIC 5CC   Final   Culture  Setup Time     Final   Value: 04/18/2014 13:02     Performed at [facility]   Culture     Final   Value: NO GROWTH 5 DAYS     Performed at [facility]   Report Status 04/24/2014 FINAL   Final  URINE CULTURE     Status: None   Collection Time    04/18/14 10:12 AM      Result Value Ref Range Status   Specimen Description URINE, CLEAN CATCH   Final   Special Requests NONE   Final   Culture  Setup Time     Final   Value: 04/18/2014 15:33     Performed at [place]     Final   Value: 20,OOO  COLONIES/ML     Performed at [facility]   Culture     Final   Value: STAPHYLOCOCCUS SPECIES (COAGULASE NEGATIVE)     Note: RIFAMPIN AND GENTAMICIN SHOULD NOT BE USED AS SINGLE DRUGS FOR TREATMENT OF STAPH INFECTIONS.     Performed at [facility]   Report Status 04/13/2014 FINAL   Final   Organism ID, Bacteria STAPHYLOCOCCUS SPECIES (COAGULASE NEGATIVE)   Final    Coagulation Studies:  Recent Labs  04/26/14 0251 04/27/14 0311  LABPROT 19.2* 15.3*  INR 1.62* 1.21    Urinalysis: No results found for this basename: COLORURINE, APPERANCEUR, LABSPEC, PHURINE, GLUCOSEU, HGBUR, BILIRUBINUR, KETONESUR, PROTEINUR, UROBILINOGEN, NITRITE, LEUKOCYTESUR,  in the last 72 hours    Imaging: Dg Chest 2 View  04/27/2014   CLINICAL DATA:  Hypoxia ; history of Goodpasture's syndrome  EXAM: CHEST  2 VIEW  COMPARISON:  April 25, 2014 chest radiograph and chest CT April 23, 2014  FINDINGS: Diffuse interstitial fibrotic change remains stable. There is no well-defined edema or consolidation. The ground-glass opacities seen on recent CT are much better seen by CT than on this radiographic examination. Heart is enlarged with pulmonary vascularity within normal limits. No adenopathy. Central catheter tip is in the right atrium. No pneumothorax. No bone lesions.  IMPRESSION: Widespread interstitial fibrotic type change remains stable. No new opacity. The ground-glass opacities seen on recent CT are not well seen on this study, although these changes probably do persist at this time. Stable cardiomegaly. No pneumothorax. No adenopathy apparent.   Electronically Signed   By: Lowella Grip M.D.   On: 04/27/2014 09:34   Dg Chest 2 View  04/25/2014   CLINICAL DATA:  Acute hypoxemia, respiratory failure  EXAM: CHEST  2 VIEW  COMPARISON:  04/22/2014  FINDINGS: Cardiomediastinal silhouette is stable. Chronic interstitial prominence again noted without convincing pulmonary edema. Right IJ Port-A-Cath is  unchanged in position. Fibrotic changes lung bases again noted. No definite superimposed infiltrate or PET Stable degenerative changes thoracic spine.  IMPRESSION: No definite superimposed infiltrate or pleural effusion. Chronic interstitial prominence without convincing pulmonary edema. Stable fibrotic changes lung bases.   Electronically Signed   By: Lahoma Crocker M.D.   On: 04/25/2014 17:10  Medications:     . antiseptic oral rinse  15 mL Mouth Rinse BID  . calcium gluconate IVPB  4 g Intravenous Once  . clonazePAM  0.25 mg Oral BID  . [START ON 05/10/2014] cyanocobalamin  1,000 mcg Subcutaneous Q30 days  . cyclobenzaprine  5 mg Oral QHS  . darbepoetin (ARANESP) injection - NON-DIALYSIS  60 mcg Subcutaneous Q Wed-1800  . tamsulosin  0.4 mg Oral Daily   And  . dutasteride  0.5 mg Oral Daily  . latanoprost  1 drop Both Eyes q morning - 10a  . levalbuterol  1.25 mg Nebulization 4 times per day  . methylPREDNISolone (SOLU-MEDROL) injection  250 mg Intravenous Q12H  . metoprolol tartrate  25 mg Oral 4 times per day  . montelukast  10 mg Oral QHS  . pantoprazole  40 mg Oral Q1200  . sulfamethoxazole-trimethoprim  2.5 tablet Oral 3 times per day   acetaminophen, bisacodyl, docusate sodium, levalbuterol, magnesium hydroxide, metoprolol, ondansetron (ZOFRAN) IV, polyethylene glycol, sodium chloride, traMADol-acetaminophen  Assessment/ Plan:  This is a very unfortunate man with multiple reasons to be dyspneic. Congestive heart failure, pulmonary edema and interstitial lung disease and possible pneumonia. He has been immunosuppressed since his initial diagnosis of Goodpastures and has had consistently negative serologies. I have discussed the case with Dr Posey Pronto who has been closely monitoring the patient for progression. His renal function has been stable although he has had persistent hematuria.   1The acute renal failure appears related to a drop in blood pressure and hypoperfusion of his  kidneys - improved but now with worsening hypotension and hypoxia  2Anti GBM negative Antibody titer and Anti MPO is also negative This would make active disease unlikely . Will stop pheresis. Possibility of cytoxan induced lung injury being considered by Dr Lake Bells  3Hyperkalemia with metabolic acidosis improved    *     LOS: 51 Bryan Hubbard @TODAY @1 :32 PM

## 2014-04-27 NOTE — Progress Notes (Signed)
[name]  Bryan Hubbard FXJ:[id number] DOB: 1940-11-13 DOA: 04/16/2014 PCP: [name], MD  Brief narrative: 73 yo WM PMHx chronic atrial flutter, DVT, Goodpasture's, and recent oxygen dependent respiratory failure after hospitalization for community acquired pneumonia last month who presented to the hospital with progressive shortness of breath and lower extremity edema. In addition he was becoming very dizzy and very weak with standing. He notified his cardiologist of these symptoms several days prior to presentation and was instructed to decrease his metoprolol to 25 mg by mouth twice a day. Unfortunately his symptoms did not improve.  He presented to the hospital where it was noted that his systolic blood pressure was in the upper 80s and upon standing dropped into the 70s. Review of patient medication revealed he was taking Lasix 20 mg daily and had continued to take this medication prior to presentation. In addition he endorsed having to increase his oxygen at home from 2 L to 4 L because of progressive shortness of breath. A two-view chest x-ray demonstrated acutely worsened diffuse bilateral airspace opacification with a peripheral distribution and diffuse increased interstitial markings that could either be acute infectious or inflammatory interstitial pneumonitis.   HPI/Subjective: Alert, no significant change in symptoms. Discussed patient's desire to attempt to be more mobile even if only remains in bed to work on strengthening.  Assessment/Plan:  Atrial flutter with rapid ventricular response-maintaining NSR -as per Cardiology -they will follow at a distance since has been stable -Initially failed attempts to use cardizem, BB, or both to control RVR -Ibutilide infusion was unsuccessful > DCCV successful under bedside anesthesia -maintaining NSR at this time  -since had prolonged RVR we checked ECHO which demonstrated a decrease in  systolic function from 64-15% to 40-45% -Eliquis remains on hold for now- initially due to expected clotting issues with plasmapheresis but also holding to ensure no further melena-7/31 without further melena but platelets low at 85,000 so will continue to hold for now  Goodpasture's -chronically treated with steroid, oral cytoxan and prn plasmapheresis  -Initially it was unclear if respiratory symptoms were a manifestation of Goodpasture's or primarily related to atrial flutter with RVR-after converted to NSR and diuresis symptoms unfortunately persisted -per Pulmonary chest x-ray was consistent with interstitial lung disease of uncertain etiology - due to current respiratory compromise unable to proceed with biopsy to help clarify diagnosis-Did receive pulse steroids at higher dose for several days without improvement in respiratory sx's  -preadmission immunosuppressants were continued -Patient subsequently underwent plasmapheresis catheter insertion 7/28 by IR and began plasmapheresis (see below) but serologies were negative for Goodpastures etiology so plasmapheresis dcd 7/30 and rales this catheter discontinued 7/31 per IR  Acute on chronic respiratory failure with hypoxia:   A) ? Acute diastolic congestive heart failure exacerbation    B) ? Pulmonary Goodpasture's    C) ? Opportunistic infection -Initially attempted to gently diurese but unfortunately only achieved worsened renal function without any improvement in respiratory status  -High resolution CT Chest 7/27 ordered by pulmonary with findings c/w Goodpastures and plasmapheresis initiated 7/28 and as of 7/29 respiratory sx's appear to be improving -on PCP prophylaxis w/ Bactrim per Pulmonary and given no evidence of pulmonary Goodpastures in setting on idiopathic ILD pulmonary MD adjusting dose to full treatment  -7/30 pulmonary also increasing steroid dose again- pt reluctant to undergo bronchoscopy given likely need for intubation and  mech ventilation -Pulmonary also concerned over cytoxan mediated fibrosis so Cytoxan was  dc'd today (7/30) -will attempt to wean oxygen -please see pulmonary notes for full details  Glomerulonephritis /Acute kidney Injury on Chronic kidney disease stage 3, GFR 30-59 ml/min -Fnx had worsened somewhat on 7/27 so Nephro consulted -markedly worse on 7/28 with BUN 127 and Scr 2.56-suspected over diuresis so Lasix dc'd -Nephrology added Bicarb infusion 7/28 -function slowly improving   Transient melena -Occurred overnight into 7/30 and as of 7/31 has not recurred -if bleeding resumes may need to replace clotting factors/platelets but suspect will resolve with dc of plasmapheresis   Hypoxemic mediated anxiety -Klonopin has helped significantly with sx's  Abnormal UA -cx positive only for 20,000 colonies of coag neg staph - further tx not currently indicated   Hematuria (recurrent) -pt states this is a recurring problem which has improve w/ his use of Jalyn - BP improved so was resumed 7/26 - urine clearing as of 7/27  Anemia of chronic disease -Hemoglobin stable - baseline between 7.9 and 9   Thrombocytopenia -follow closely - resolving slowly and greater than 100,000   HYPERLIPIDEMIA  HYPERTENSION -BP well controlled  History of DVT / PE / IVC filter -was transitioned from warfarin to eliquis during the last hospitalization at recommendation of the Pulmonologist in setting of inability to maintain therapeutic INR on warfarin  DVT prophylaxis: SCDs Code Status: Full Family Communication: spoke w/ pt at length and wife Disposition Plan/Expected LOS: Step down  Consultants: Cardiology Pulmonology Nephrology  IR   Procedures: 7/24 - bedside DCCV 7/28- Trialysis catheter 7/28- ECHO pending  Positive Cultures: Urine culture coag neg staph  Antibiotics: None  Objective: Blood pressure 100/61, pulse 101, temperature 97.5 F (36.4 C), temperature source Oral, resp. rate  18, height 5\' 10"  (1.778 m), weight 186 lb (84.369 kg), SpO2 94.00%.  Intake/Output Summary (Last 24 hours) at 04/27/14 1525 Last data filed at 04/27/14 1321  Gross per 24 hour  Intake    624 ml  Output   1350 ml  Net   -726 ml   Exam: General: No acute respiratory distress at rest Lungs: Diminished throughout with fine diffuse crackles - no wheeze - stable on high flow oxygen cannula w/ humidification Cardiovascular: regular rate and sinus rhythm presently w/o gallup or rub  Abdomen: Nontender, nondistended, soft, bowel sounds positive, no rebound, no ascites, no appreciable mass Musculoskeletal: No significant cyanosis, clubbing of bilateral lower extremities - trace B LE edema   Scheduled Meds:  Scheduled Meds: . antiseptic oral rinse  15 mL Mouth Rinse BID  . calcium gluconate IVPB  4 g Intravenous Once  . clonazePAM  0.25 mg Oral BID  . [START ON 05/10/2014] cyanocobalamin  1,000 mcg Subcutaneous Q30 days  . cyclobenzaprine  5 mg Oral QHS  . darbepoetin (ARANESP) injection - NON-DIALYSIS  60 mcg Subcutaneous Q Wed-1800  . tamsulosin  0.4 mg Oral Daily   And  . dutasteride  0.5 mg Oral Daily  . latanoprost  1 drop Both Eyes q morning - 10a  . levalbuterol  1.25 mg Nebulization 4 times per day  . methylPREDNISolone (SOLU-MEDROL) injection  250 mg Intravenous Q12H  . metoprolol tartrate  25 mg Oral 4 times per day  . montelukast  10 mg Oral QHS  . pantoprazole  40 mg Oral Q1200  . sulfamethoxazole-trimethoprim  2.5 tablet Oral 3 times per day   Data Reviewed: Basic Metabolic Panel:  Recent Labs Lab 04/24/14 04/24/14 1402 04/24/14 1408 04/25/14 0236 04/25/14 1318 04/26/14 0251 04/27/14 0311  NA 131* 133*  128* 137 132* 137 135*  K 5.8* 5.1 5.0 4.2 4.4 4.2 4.8  CL 90* 90* 95* 94* 91* 96 93*  CO2 16* 22  --  22  --  25 28  GLUCOSE 170* 136* 136* 180* 213* 139* 175*  BUN 127* 135* >140* 120* 121* 98* 97*  CREATININE 2.56* 2.47* 3.30* 2.15* 2.70* 1.77* 1.86*  CALCIUM  9.4 9.1  --  8.9  --  8.6 8.7   Liver Function Tests:  Recent Labs Lab 04/27/14 0311  AST 17  ALT 10  ALKPHOS 32*  BILITOT 0.7  PROT 4.7*  ALBUMIN 3.7    CBC:  Recent Labs Lab 04/23/14 0020 04/24/14 0930 04/24/14 1408 04/25/14 0236 04/25/14 1318 04/26/14 0251 04/27/14 0311  WBC 4.6 5.1  --  4.7  --  5.4 5.0  HGB 9.8* 10.0* 10.2* 9.0* 9.5* 8.3* 7.7*  HCT 30.1* 30.6* 30.0* 26.8* 28.0* 24.8* 22.4*  MCV 97.4 98.4  --  95.4  --  95.8 94.9  PLT 137* 156  --  119*  --  PLATELET CLUMPS NOTED ON SMEAR, COUNT APPEARS DECREASED 85*   Cardiac Enzymes: No results found for this basename: CKTOTAL, CKMB, CKMBINDEX, TROPONINI,  in the last 168 hours BNP (last 3 results)  Recent Labs  03/05/14 0938 03/30/14 2015 04/19/2014 2027  PROBNP 573.0* 7170.0* 6768.0*   Studies:  Recent x-ray studies have been reviewed in detail by the Attending Physician  Time spent :  26mins  Allison Ellis, ANP [facility] For Consults/Admissions - Flow Manager - [phone] Office  [phone] Pager [phone]  On-Call/Text Page:      [email or web address]      password [facility]  04/27/2014, 3:25 PM   LOS: 10 days   Examined patient with ANP Ebony Hail, discussed assessment and plan and agree with the above plan  patient with multiple complex medical issues> 40 min on direct patient care

## 2014-04-27 NOTE — Evaluation (Signed)
Physical Therapy Evaluation Patient Details Name: Bryan Hubbard MRN: [id number] DOB: 02/22/1941 Today's Date: 04/27/2014   History of Present Illness  This 73 y.o. 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 LE edema. Diagnosis:  CHF exacerbation; A-fib; orthostatic hypotension; NSTEMI.  He underwent cardioversion 04/24/2014 due to aflutter with RVR.  He has continued with chronic hypoxemia respiratory failure presumed due to PE, pulmonary edema and underlying parenchymal lung disease (Goodpature syndrome).  Chest CT 04/23/14 with questionable worsening Goodpasture's syndrome, but  he had normal antibody panel making Goodpastures unlikely. Underwent plasmapheresis x 2.  He has continued Hypoxemia and is currently (04/27/14) on High flow 02 and PT/OT were ordered to attempt EOB activity.    Clinical Impression  Pt admitted with the above. Pt currently with functional limitations due to the deficits listed below (see PT Problem List). At the time of PT eval, pt required +2 assist to transition to EOB. Although difficult to achieve a solid waveform on the O2 monitor, at times sats dropped as low as 63% (with a good waveform) during mobility. Pt saw EOB ~4 minutes and then was returned to supine due to dyspnea and low O2 readings. Pt will benefit from skilled PT to increase their independence and safety with mobility to allow discharge to the venue listed below. SNF recommended at this time, however based on level of progress, d/c disposition will be updated in future sessions.      Follow Up Recommendations SNF;Supervision/Assistance - 24 hour    Equipment Recommendations  Other (comment) (TBD based on progress)    Recommendations for Other Services       Precautions / Restrictions Precautions Precautions: Fall;Other  (comment) (hypoxemia - high flow O2) Restrictions Weight Bearing Restrictions: No      Mobility  Bed Mobility Overal bed mobility: +2 for physical assistance;Needs Assistance Bed Mobility: Supine to Sit;Sit to Supine     Supine to sit: Min assist;+2 for physical assistance Sit to supine: Mod assist;+2 for physical assistance   General bed mobility comments: assist to initiate movement of LE off bed and to lift trunk.  Assist to return to supine  Transfers                 General transfer comment: unable due to dyspnea and hypoxemia  Ambulation/Gait                Stairs            Wheelchair Mobility    Modified Rankin (Stroke Patients Only)       Balance Overall balance assessment: Needs assistance Sitting-balance support: Feet supported;Bilateral upper extremity supported Sitting balance-Leahy Scale: Poor Sitting balance - Comments: min A sitting EOB                                     Pertinent Vitals/Pain HR 118-120 during session. O2 sats dropping as low as 63% during mobility.     Home Living Family/patient expects to be discharged to:: Private residence Living Arrangements: Spouse/significant other Available Help at Discharge: Family;Available 24 hours/day Type of Home: House Home Access: Stairs to enter Entrance Stairs-Rails: Psychiatric nurse of Steps: 3 Home Layout: Multi-level;Able to live on main level with bedroom/bathroom Home Equipment: Other (comment) (Home  O2 all the time. 2L/min at rest, 4L/min while active)      Prior Function Level of Independence: Independent with assistive device(s)         Comments: Pt reports he was experiencing falls at home.  He did ambulate without AD and reports he was able to perform BADLs mod I.  He reports he was on 2L 02 at when at rest at home and 4L when he was up and moving.  He does not drive     Hand Dominance   Dominant Hand: Right     Extremity/Trunk Assessment   Upper Extremity Assessment: Defer to OT evaluation           Lower Extremity Assessment: Generalized weakness      Cervical / Trunk Assessment: Other exceptions  Communication   Communication: No difficulties  Cognition Arousal/Alertness: Awake/alert Behavior During Therapy: WFL for tasks assessed/performed Overall Cognitive Status: Within Functional Limits for tasks assessed                      General Comments General comments (skin integrity, edema, etc.): Pt moved to EOB with +2 assist. Pt sat EOB x 3-4 mins. Unable to get accurate 02 sat reading with readings ranging from 74-91 on 40L, 60% FIO2 on high flow 02. Pt with dypnea progressing to 4/4. Pt returned to supine with 02 sat reading 63% and HR 118. Pt required ~7 mins to recover 02 sats to 90-91%. RN notified.     Exercises        Assessment/Plan    PT Assessment Patient needs continued PT services  PT Diagnosis Difficulty walking;Generalized weakness   PT Problem List Decreased strength;Decreased range of motion;Decreased activity tolerance;Decreased balance;Decreased mobility;Decreased knowledge of use of DME;Decreased safety awareness;Decreased knowledge of precautions;Cardiopulmonary status limiting activity  PT Treatment Interventions DME instruction;Gait training;Functional mobility training;Therapeutic activities;Therapeutic exercise;Neuromuscular re-education;Patient/family education   PT Goals (Current goals can be found in the Care Plan section) Acute Rehab PT Goals Patient Stated Goal: To get stronger  PT Goal Formulation: With patient Time For Goal Achievement: 05/11/14 Potential to Achieve Goals: Fair    Frequency Min 2X/week   Barriers to discharge        Co-evaluation PT/OT/SLP Co-Evaluation/Treatment: Yes Reason for Co-Treatment: Complexity of the patient's impairments (multi-system involvement);For patient/therapist safety PT goals addressed during  session: Mobility/safety with mobility;Balance OT goals addressed during session: Strengthening/ROM       End of Session Equipment Utilized During Treatment: Oxygen Activity Tolerance: Patient limited by fatigue;Other (comment) (Limited by dyspnea/hypoxemia) Patient left: in bed;with call bell/phone within reach Nurse Communication: Mobility status         Time: 9373-4287 PT Time Calculation (min): 24 min   Charges:   PT Evaluation $Initial PT Evaluation Tier I: 1 Procedure PT Treatments $Therapeutic Activity: 8-22 mins   PT G Codes:          Jolyn Lent 04/27/2014, 12:45 PM  Jolyn Lent, PT, DPT Acute Rehabilitation Services Pager: [phone]

## 2014-04-27 NOTE — Procedures (Signed)
Trialysis catheter removal RT IJ.

## 2014-04-27 NOTE — Evaluation (Signed)
Occupational Therapy Evaluation Patient Details Name: Bryan Hubbard MRN: [id number] DOB: 02-16-1941 Today's Date: 04/27/2014    History of Present Illness This 73 y.o. 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 LE edema. Diagnosis:  CHF exacerbation; A-fib; orthostatic hypotension; NSTEMI.  He underwent cardioversion 03/29/2014 due to aflutter with RVR.  He has continued with chronic hypoxemia respiratory failure presumed due to PE, pulmonary edema and underlying parenchymal lung disease (Goodpature syndrome).  Chest CT 04/23/14 with questionable worsening Goodpasture's syndrome, but  he had normal antibody panel making Goodpastures unlikely. Underwent plasmapheresis x 2.  He has continued Hypoxemia and is currently (04/27/14) on High flow 02 and PT/OT were ordered to attempt EOB activity.     Clinical Impression   Pt admitted with above. He demonstrates the below listed deficits and will benefit from continued OT to maximize safety and independence with BADLs.  Co-eval with PT.  Pt moved to EOB with +2 assist.  Pt sat EOB x 3-4 mins.   Unable to get accurate 02 sat reading with readings ranging from 74-91 on 40L, 60% FIO2 on high flow 02.   Pt with dypnea progressing to 4/4.  Pt returned to supine with 02 sat reading 63% and HR 118.  Pt required ~7 mins to recover 02 sats to 90-91%.  RN notified.   Will attempt to progress OT slowly with focus on bed level activity and exercise as he did not tolerate EOB sitting well at all.  D/C disposition will be determined based on pt progress, as it is too early to predict at this time.       Follow Up Recommendations  Other (comment) (TBD based on pt progress)    Equipment Recommendations  Other (comment) (TBD)    Recommendations for Other Services       Precautions /  Restrictions Precautions Precautions: Fall;Other (comment) (Hypoxemia - High flow 02)      Mobility Bed Mobility Overal bed mobility: +2 for physical assistance;Needs Assistance Bed Mobility: Supine to Sit;Sit to Supine     Supine to sit: Min assist;+2 for physical assistance Sit to supine: Mod assist;+2 for physical assistance   General bed mobility comments: assist to initiate movement of LE off bed and to lift trunk.  Assist to return to supine  Transfers                 General transfer comment: unable due to dyspnea and hypoxemia    Balance Overall balance assessment: Needs assistance Sitting-balance support: Feet supported Sitting balance-Leahy Scale: Poor Sitting balance - Comments: min A sitting EOB                                    ADL Overall ADL's : Needs assistance/impaired Eating/Feeding: Minimal assistance;Bed level   Grooming: Wash/dry hands;Wash/dry face;Oral care;Minimal assistance;Bed level   Upper Body Bathing: Total assistance;Bed level   Lower Body Bathing: Total assistance;Bed level   Upper Body Dressing : Total assistance;Bed level   Lower Body Dressing: Total assistance;Bed level   Toilet Transfer: Total assistance (unable to perform)   Toileting- Clothing Manipulation and Hygiene: Total assistance;Bed level       Functional mobility during ADLs: Moderate assistance;+2 for physical assistance (for bed mobility only ) General ADL  Comments: limited by dyspnea and hypoemia     Vision                     Perception     Praxis      Pertinent Vitals/Pain Pt denies pain      Hand Dominance Right   Extremity/Trunk Assessment Upper Extremity Assessment Upper Extremity Assessment: Generalized weakness   Lower Extremity Assessment Lower Extremity Assessment: Defer to PT evaluation   Cervical / Trunk Assessment Cervical / Trunk Assessment: Other exceptions Cervical / Trunk Exceptions: generalized  weakness   Communication Communication Communication: No difficulties   Cognition Arousal/Alertness: Awake/alert Behavior During Therapy: WFL for tasks assessed/performed Overall Cognitive Status: Within Functional Limits for tasks assessed                     General Comments       Exercises       Shoulder Instructions      Home Living Family/patient expects to be discharged to:: Private residence Living Arrangements: Spouse/significant other Available Help at Discharge: Family;Available 24 hours/day Type of Home: House Home Access: Stairs to enter [employer] of Steps: 3 Entrance Stairs-Rails: Right;Left (can only reach one railing at a time) Home Layout: Multi-level;Able to live on main level with bedroom/bathroom     Bathroom Shower/Tub: Tub/shower unit;Walk-in shower   Bathroom Toilet: Standard     Home Equipment: Other (comment) (02)          Prior Functioning/Environment Level of Independence: Independent with assistive device(s)        Comments: Pt reports he was experiencing falls at home.  He did ambulated without AD and reports he was able to perform BADLs mod I.  He reports he was on 2L 02 at when at rest at home and 4L when he was up and moving.  He does not drive    OT Diagnosis: Generalized weakness   OT Problem List: Decreased strength;Decreased activity tolerance;Impaired balance (sitting and/or standing);Decreased knowledge of use of DME or AE;Cardiopulmonary status limiting activity   OT Treatment/Interventions: Self-care/ADL training;Therapeutic exercise;DME and/or AE instruction;Therapeutic activities;Balance training;Patient/family education    OT Goals(Current goals can be found in the care plan section) Acute Rehab OT Goals Patient Stated Goal: To get stronger  OT Goal Formulation: With patient Time For Goal Achievement: 05/11/14 Potential to Achieve Goals: Fair ADL Goals Pt Will Perform Eating: with modified  independence;sitting Pt Will Perform Grooming: with set-up;sitting Additional ADL Goal #1: Pt will be able to tolerate EOB sitting with min guard assist x  5 mins with 02 sats at least 90% Additional ADL Goal #2: Pt will be able to perform bil. UE exercises bed level, maintaining sats >90%  OT Frequency: Min 3X/week   Barriers to D/C:            Co-evaluation PT/OT/SLP Co-Evaluation/Treatment: Yes Reason for Co-Treatment: Complexity of the patient's impairments (multi-system involvement);For patient/therapist safety   OT goals addressed during session: Strengthening/ROM      End of Session Equipment Utilized During Treatment: Oxygen Nurse Communication: Mobility status;Other (comment) (results of eval )  Activity Tolerance: Treatment limited secondary to medical complications (Comment) Patient left: in bed;with call bell/phone within reach   Time: 2637-8588 OT Time Calculation (min): 23 min Charges:  OT General Charges $OT Visit: 1 Procedure OT Evaluation $Initial OT Evaluation Tier I: 1 Procedure OT Treatments $Therapeutic Activity: 8-22 mins G-Codes:    Jahzaria Vary M 05-20-2014, 12:29 PM

## 2014-04-27 NOTE — Progress Notes (Addendum)
PULMONARY / CRITICAL CARE MEDICINE  Name: Bryan Hubbard MRN: [id number] DOB: 28-Feb-1941    ADMISSION DATE:  04/07/2014 CONSULTATION DATE:  03/31/2014  REFERRING MD :  Thereasa Solo  PRIMARY SERVICE:  TRH  CHIEF COMPLAINT:  Hypoxemic respiratory failure  BRIEF PATIENT DESCRIPTION: [name] with PMH of Goodpastures - O2 dependent, presents with worsening SOB, dizziness, weakness and LE swelling.  Cards consulted and did not feel that SOB was due to a primary cardiac reason.  PCCM was consulted for further evaluation.  SIGNIFICANT EVENTS / STUDIES:  3/27 TTE >>> PAP 45 torr 7/3   Chest CT >>> bilateral interstitial lung abnormality without nodules or consolidation, no effusion  7/7   VQ scan >>> intermediate probability of PE, new acute perfusion defect LUL since prior VQ in 12/2013  7/7   PFT >>> restrictive pattern, severe diffusion impairment, significant bronchodilator responce  7/8   Venous duplex >>> no clot  7/24 DC cardioversion 7/27 CT chest high resolution> worsening traction bronchiectasis and diffuse GGO worrisome for pneumonitis/Goodpasture syndrome 7/28 plasmapheresis x2 sessions through 7/29 7/29 Anti GBM, mpo, pr3 ab all negative 7/30 Cytoxan stopped  LINES / TUBES:  CULTURES: Urine 7/22 >> coag neg staph (likely contaminant) Blood 7/22 >>   ANTIBIOTICS: 7/30 bactrim >>  INTERVAL HISTORY:   No acute changes  VITAL SIGNS: Temp:  [97.2 F (36.2 C)-98 F (36.7 C)] 97.4 F (36.3 C) (07/31 0800) Pulse Rate:  [56-76] 69 (07/31 0800) Resp:  [12-22] 19 (07/31 0800) BP: (81-112)/(43-68) 112/68 mmHg (07/31 0800) SpO2:  [84 %-99 %] 91 % (07/31 0800) FiO2 (%):  [60 %] 60 % (07/31 0800) Weight:  [84.369 kg (186 lb)] 84.369 kg (186 lb) (07/31 0300)  HEMODYNAMICS:   VENTILATOR SETTINGS: Vent Mode:  [-]  FiO2 (%):  [60 %] 60 %  INTAKE / OUTPUT: Intake/Output     07/30 0701 - 07/31 0700 07/31 0701 - 08/01 0700   P.O. 840    I.V. (mL/kg) 150 (1.8)    IV Piggyback 102     Total Intake(mL/kg) 1092 (12.9)    Urine (mL/kg/hr) 1350 (0.7)    Total Output 1350     Net -258           PHYSICAL EXAMINATION: General: no acute distress HEENT: NCAT, high flow O2 mask, OP clear PULM: crackles in bases bilaterally CV: RRR, no mgr, no JVD AB: BS+, soft, nontender Ex: edema R leg > L increased on 7/31 exam Neuro: A&Ox4, MAEW  LABS: I have reviewed all of today's lab results. Relevant abnormalities are discussed in the A/P section  CXR: NNF ASSESSMENT / PLAN:  Acute hypoxemic respiratory failure due to: -ILD > most worrisome for cytoxan toxicity, less likely PCP   -PE, s/p IVC filter -pulmonary edema, weight up 7/31  No cytoxan High dose cytoxan 48 hours, then resume prednisone 60mg  daily If worse> intubate and perform bronchoscopy for PCP, he is OK with short term vent Wean O2 for O2 saturation > 92% Use high flow oxygen Bactrim empirically for PCP Lasix x1 7/31  AKI > improving Per renal/TRH Monitor BMET closely with bactrim  AFRVR > SR s/p DCCV, Cards managing Possible component of edema Chronic diastolic heart failure If bleeding from rectum resolves, I am OK with restarting anticoagulation  Goodpasture Syndrome: serologies now negative Solumedrol > prednisone Will likely need rituxan if survives  Code status: addressed 7/31, he is OK with short term intubation  Roselie Awkward, MD [facility] PCCM Pager: [phone] Cell: (  [phone] If no response, call [phone]   04/27/2014, 8:50 AM

## 2014-04-28 DIAGNOSIS — E875 Hyperkalemia: Secondary | ICD-10-CM

## 2014-04-28 LAB — CBC
HCT: 23.5 % — ABNORMAL LOW (ref 39.0–52.0)
Hemoglobin: 8.1 g/dL — ABNORMAL LOW (ref 13.0–17.0)
MCH: 32.3 pg (ref 26.0–34.0)
MCHC: 34.5 g/dL (ref 30.0–36.0)
MCV: 93.6 fL (ref 78.0–100.0)
PLATELETS: 112 10*3/uL — AB (ref 150–400)
RBC: 2.51 MIL/uL — AB (ref 4.22–5.81)
RDW: 20.6 % — ABNORMAL HIGH (ref 11.5–15.5)
WBC: 7.9 10*3/uL (ref 4.0–10.5)

## 2014-04-28 LAB — BASIC METABOLIC PANEL
ANION GAP: 17 — AB (ref 5–15)
BUN: 113 mg/dL — ABNORMAL HIGH (ref 6–23)
CHLORIDE: 91 meq/L — AB (ref 96–112)
CO2: 24 mEq/L (ref 19–32)
Calcium: 9.2 mg/dL (ref 8.4–10.5)
Creatinine, Ser: 2.39 mg/dL — ABNORMAL HIGH (ref 0.50–1.35)
GFR calc Af Amer: 29 mL/min — ABNORMAL LOW (ref >90)
GFR calc non Af Amer: 25 mL/min — ABNORMAL LOW (ref >90)
Glucose, Bld: 202 mg/dL — ABNORMAL HIGH (ref 70–99)
POTASSIUM: 5.7 meq/L — AB (ref 3.7–5.3)
Sodium: 132 mEq/L — ABNORMAL LOW (ref 137–147)

## 2014-04-28 LAB — POTASSIUM
POTASSIUM: 6.3 meq/L — AB (ref 3.7–5.3)
POTASSIUM: 6.2 meq/L — AB (ref 3.7–5.3)
Potassium: 5.6 mEq/L — ABNORMAL HIGH (ref 3.7–5.3)

## 2014-04-28 MED ORDER — SODIUM CHLORIDE 0.9 % IV SOLN: INTRAVENOUS | Status: DC

## 2014-04-28 MED ORDER — SULFAMETHOXAZOLE-TMP DS 800-160 MG PO TABS
2.5000 | ORAL_TABLET | Freq: Two times a day (BID) | ORAL | Status: DC
  Filled 2014-04-28: qty 2.5

## 2014-04-28 MED ORDER — SODIUM POLYSTYRENE SULFONATE 15 GM/60ML PO SUSP
60.0000 g | Freq: Two times a day (BID) | ORAL | Status: DC
  Administered 2014-04-28: 60 g via ORAL
  Filled 2014-04-28 (×3): qty 240

## 2014-04-28 MED ORDER — SODIUM CHLORIDE 0.9 % IV SOLN
2.0000 g | Freq: Once | INTRAVENOUS | Status: AC
  Administered 2014-04-28: 2 g via INTRAVENOUS
  Filled 2014-04-28: qty 20

## 2014-04-28 NOTE — Progress Notes (Signed)
PULMONARY / CRITICAL CARE MEDICINE  Name: Bryan Hubbard MRN: [id number] DOB: 1941/07/09    ADMISSION DATE:  04/23/2014 CONSULTATION DATE:  03/31/2014  REFERRING MD :  Thereasa Solo  PRIMARY SERVICE:  TRH  CHIEF COMPLAINT:  Hypoxemic respiratory failure  BRIEF PATIENT DESCRIPTION: [name] with PMH of Goodpastures - O2 dependent, presents with worsening SOB, dizziness, weakness and LE swelling.  Cards consulted and did not feel that SOB was due to a primary cardiac reason.  PCCM was consulted for further evaluation.  SIGNIFICANT EVENTS / STUDIES:  3/27 TTE >>> PAP 45 torr 7/3   Chest CT >>> bilateral interstitial lung abnormality without nodules or consolidation, no effusion  7/7   VQ scan >>> intermediate probability of PE, new acute perfusion defect LUL since prior VQ in 12/2013  7/7   PFT >>> restrictive pattern, severe diffusion impairment, significant bronchodilator responce  7/8   Venous duplex >>> no clot  7/24 DC cardioversion 7/27 CT chest high resolution> worsening traction bronchiectasis and diffuse GGO worrisome for pneumonitis/Goodpasture syndrome 7/28 plasmapheresis x2 sessions through 7/29 7/29 Anti GBM, mpo, pr3 ab all negative 7/30 Cytoxan stopped  LINES / TUBES: PIV  CULTURES: Urine 7/22 >> coag neg staph (likely contaminant) Blood 7/22 >>   ANTIBIOTICS: 7/30 bactrim >>  INTERVAL HISTORY:   No acute changes, pt states he is less dyspneic at present  VITAL SIGNS: Temp:  [97.3 F (36.3 C)-97.7 F (36.5 C)] 97.3 F (36.3 C) (08/01 0821) Pulse Rate:  [54-101] 54 (08/01 0600) Resp:  [15-22] 20 (08/01 0821) BP: (85-125)/(48-72) 94/55 mmHg (08/01 0821) SpO2:  [88 %-98 %] 94 % (08/01 0839) FiO2 (%):  [60 %] 60 % (08/01 0839) Weight:  [83.416 kg (183 lb 14.4 oz)] 83.416 kg (183 lb 14.4 oz) (08/01 0600)  HEMODYNAMICS: CV stable    VENTILATOR SETTINGS: Vent Mode:  [-]  FiO2 (%):  [60 %] 60 %  INTAKE / OUTPUT: Intake/Output     07/31 0701 - 08/01 0700 08/01  0701 - 08/02 0700   P.O. 660    I.V. (mL/kg)     IV Piggyback 52    Total Intake(mL/kg) 712 (8.5)    Urine (mL/kg/hr) 1150 (0.6)    Total Output 1150     Net -438           PHYSICAL EXAMINATION: General: no acute distress HEENT: NCAT, high flow O2 mask, OP clear PULM: crackles in bases bilaterally CV: RRR, no mgr, no JVD AB: BS+, soft, nontender Ex: edema R leg > L increased on 7/31 exam Neuro: A&Ox4, MAEW  LABS: I have reviewed all of today's lab results. Relevant abnormalities are discussed in the A/P section  CXR: no changes  ASSESSMENT / PLAN:  Acute hypoxemic respiratory failure due to: -ILD > most worrisome for cytoxan toxicity, less likely PCP   -PE, s/p IVC filter -pulmonary edema, weight up 7/31  No cytoxan, this was d/c If he worsens to vent and is intubated, not likely to survive.  Would advocate DNR status. Pt considering Wean O2 for O2 saturation > 92% Use high flow oxygen Bactrim empirically for PCP No lasix   AKI > improving Per renal/TRH Monitor BMET closely with bactrim  AFRVR > SR s/p DCCV, Cards managing Possible component of edema Chronic diastolic heart failure If bleeding from rectum resolves, I am OK with restarting anticoagulation  Goodpasture Syndrome: serologies now negative Solumedrol > prednisone Will likely need rituxan if survives  Code status: addressed 8/1. He is  considering no intubation.    Mariel Sleet Beeper  [phone]  Cell  [phone]  If no response or cell goes to voicemail, call beeper [phone]   04/28/2014, 10:49 AM

## 2014-04-28 NOTE — Progress Notes (Signed)
Notified NP for [facility] that his potassium level was 5.7. No new orders at this time.

## 2014-04-28 NOTE — Progress Notes (Signed)
[facility] TEAM 1 - Stepdown/ICU TEAM Progress Note  Bryan Hubbard HGD:[id number] DOB: 02/14/1941 DOA: 04/15/2014 PCP: [name], MD  Admit HPI / Brief Narrative: 73 yo WM PMHx chronic atrial flutter, DVT, Goodpasture's, and recent oxygen dependent respiratory failure after hospitalization for community acquired pneumonia last month who presented to the hospital with progressive shortness of breath and lower extremity edema. In addition he was becoming very dizzy and very weak with standing. He notified his cardiologist of these symptoms several days prior to presentation and was instructed to decrease his metoprolol to 25 mg by mouth twice a day. Unfortunately his symptoms did not improve.  He presented to the hospital where it was noted that his systolic blood pressure was in the upper 80s and upon standing dropped into the 70s. Review of patient medication revealed he was taking Lasix 20 mg daily and had continued to take this medication prior to presentation. In addition he endorsed having to increase his oxygen at home from 2 L to 4 L because of progressive shortness of breath. A two-view chest x-ray demonstrated acutely worsened diffuse bilateral airspace opacification with a peripheral distribution and diffuse increased interstitial markings that could either be acute infectious or inflammatory interstitial pneumonitis.    HPI/Subjective: 8/1 patient resting comfortably in bed, states he feels that his breathing is slightly improved   Assessment/Plan: Atrial flutter with rapid ventricular response-maintaining NSR  -as per Cardiology -they will follow at a distance since has been stable  -Initially failed attempts to use cardizem, BB, or both to control RVR  -Ibutilide infusion was unsuccessful > DCCV successful under bedside anesthesia  -maintaining NSR at this time  -since had prolonged RVR we checked ECHO which demonstrated a decrease in systolic function from 96-22% to 40-45%    -Eliquis remains on hold for now- initially due to expected clotting issues with plasmapheresis but also holding to ensure no further melena-7/31 without further melena but platelets low but improving  Goodpasture's  -chronically treated with steroid, oral cytoxan and prn plasmapheresis  -Initially it was unclear if respiratory symptoms were a manifestation of Goodpasture's or primarily related to atrial flutter with RVR-after converted to NSR and diuresis symptoms unfortunately persisted  -per Pulmonary chest x-ray was consistent with interstitial lung disease of uncertain etiology - due to current respiratory compromise unable to proceed with biopsy to help clarify diagnosis-Did receive pulse steroids at higher dose for several days without improvement in respiratory sx's  -preadmission immunosuppressants were continued  -Patient subsequently underwent plasmapheresis catheter insertion 7/28 by IR and began plasmapheresis (see below) but serologies were negative for Goodpastures etiology so plasmapheresis dcd 7/30 and catheter discontinued 7/31 per IR   Acute on chronic respiratory failure with hypoxia:  -Patient continues to require high flow nasal cannula in order to maintain SpO2 greater than 92% while resting -Per [facility] M. note 8/1 patient considering DNR  Acute systolic CHF  -See echocardiogram below   Opportunistic infection  -Initially attempted to gently diurese but unfortunately only achieved worsened renal function without any improvement in respiratory status  -High resolution CT Chest 7/27 ordered by pulmonary with findings c/w Goodpastures and plasmapheresis initiated 7/28 and as of 7/29 respiratory sx's appear to be improving  -on PCP prophylaxis w/ Bactrim per Pulmonary and given no evidence of pulmonary Goodpastures in setting on idiopathic ILD pulmonary MD adjusting dose to full treatment  -7/30 pulmonary also increasing steroid dose again- pt reluctant to undergo bronchoscopy  given likely need for intubation and mech ventilation  -  Pulmonary also concerned over cytoxan mediated fibrosis so Cytoxan was dc'd (7/30); Solu-Medrol per pulmonology     Glomerulonephritis /Acute kidney Injury on Chronic kidney disease stage 3, GFR 30-59 ml/min  -Fnx had worsened somewhat on 7/27 so Nephro consulted  -markedly worse on 7/28 with BUN 127 and Scr 2.56-suspected over diuresis so Lasix dc'd  -Nephrology added Bicarb infusion 7/28  -Continue treatment per nephrology  Transient melena  -Occurred overnight into 7/30 and as of 7/31 has not recurred  -if bleeding resumes may need to replace clotting factors/platelets but suspect will resolve with dc of plasmapheresis   Hypoxemic mediated anxiety  -Klonopin has helped significantly with sx's   Abnormal UA  -cx positive only for 20,000 colonies of coag neg staph - further tx not currently indicated   Hematuria (recurrent)  -pt states this is a recurring problem, all anticoagulants on hold   Anemia of chronic disease  -Hemoglobin stable - baseline between 7.9 and 9   Thrombocytopenia  - stable no overt signs of hemorrhage   HYPERLIPIDEMIA   HYPERTENSION  -Hypotensive for a 73 year old  - Given patient's systolic CHF, and renal failure will gently hydrate normal saline at 32ml/hr  History of DVT / PE / IVC filter  -was transitioned from warfarin to eliquis during the last hospitalization at recommendation of the Pulmonologist in setting of inability to maintain therapeutic INR on warfarin; currently on hold  Hyperkalemia -Most likely caused by addition of Bactrim to treat empirically PCP -Unable to use Lasix on patient secondary to increasing renal failure. -Gentle hydration to avoid dehydration -Calcium gluconate IV 2 gm x1 -Kayexalate 60 gm BID; repeat potassium every 4 hour    Code Status: FULL Family Communication: no family present at time of exam Disposition Plan: Per [facility] M.    Consultants: Dr.  Asencion Noble [facility] M.) Dr. Edrick Oh (nephrology)   Procedure/Significant Events: 7/29 echocardiogram - Left ventricle: mild LVH. LVEF= 40% to 45%.  - Mitral valve:  mild regurgitation. - Tricuspid valve:  moderate regurgitation.    Culture Urine culture coag neg staph   Antibiotics: Bactrim 8/1>>  DVT prophylaxis: SCD   Devices NA   LINES / TUBES:  7/29 22ga right forearm    Continuous Infusions:   Objective: VITAL SIGNS: Temp: 97.5 F (36.4 C) (08/01 1235) Temp src: Oral (08/01 1235) BP: 120/56 mmHg (08/01 1235) Pulse Rate: 54 (08/01 0600) SPO2; high flow nasal cannula, 40 L O2 FIO2: 65%   Intake/Output Summary (Last 24 hours) at 04/28/14 1240 Last data filed at 04/28/14 0656  Gross per 24 hour  Intake    302 ml  Output   1150 ml  Net   -848 ml     Exam: General: A./O. x4, NAD, No acute respiratory distress Lungs: Clear to auscultation bilaterally without wheezes or crackles Cardiovascular: Regular rate and rhythm without murmur gallop or rub normal S1 and S2 Abdomen: Nontender, nondistended, soft, bowel sounds positive, no rebound, no ascites, no appreciable mass Extremities: No significant cyanosis, clubbing, or edema bilateral lower extremities  Data Reviewed: Basic Metabolic Panel:  Recent Labs Lab 04/24/14 1402  04/25/14 0236 04/25/14 1318 04/26/14 0251 04/27/14 0311 04/28/14 0218  NA 133*  < > 137 132* 137 135* 132*  K 5.1  < > 4.2 4.4 4.2 4.8 5.7*  CL 90*  < > 94* 91* 96 93* 91*  CO2 22  --  22  --  25 28 24   GLUCOSE 136*  < > 180* 213* 139*  175* 202*  BUN 135*  < > 120* 121* 98* 97* 113*  CREATININE 2.47*  < > 2.15* 2.70* 1.77* 1.86* 2.39*  CALCIUM 9.1  --  8.9  --  8.6 8.7 9.2  < > = values in this interval not displayed. Liver Function Tests:  Recent Labs Lab 04/27/14 0311  AST 17  ALT 10  ALKPHOS 32*  BILITOT 0.7  PROT 4.7*  ALBUMIN 3.7   No results found for this basename: LIPASE, AMYLASE,  in the last 168  hours No results found for this basename: AMMONIA,  in the last 168 hours CBC:  Recent Labs Lab 04/24/14 0930  04/25/14 0236 04/25/14 1318 04/26/14 0251 04/27/14 0311 04/28/14 0218  WBC 5.1  --  4.7  --  5.4 5.0 7.9  HGB 10.0*  < > 9.0* 9.5* 8.3* 7.7* 8.1*  HCT 30.6*  < > 26.8* 28.0* 24.8* 22.4* 23.5*  MCV 98.4  --  95.4  --  95.8 94.9 93.6  PLT 156  --  119*  --  PLATELET CLUMPS NOTED ON SMEAR, COUNT APPEARS DECREASED 85* 112*  < > = values in this interval not displayed. Cardiac Enzymes: No results found for this basename: CKTOTAL, CKMB, CKMBINDEX, TROPONINI,  in the last 168 hours BNP (last 3 results)  Recent Labs  03/05/14 0938 03/30/14 2015 04/16/2014 2027  PROBNP 573.0* 7170.0* 6768.0*   CBG:  Recent Labs Lab 04/24/14 0813  GLUCAP 101*    No results found for this or any previous visit (from the past 240 hour(s)).   Studies:  Recent x-ray studies have been reviewed in detail by the Attending Physician  Scheduled Meds:  Scheduled Meds: . antiseptic oral rinse  15 mL Mouth Rinse BID  . clonazePAM  0.25 mg Oral BID  . [START ON 05/10/2014] cyanocobalamin  1,000 mcg Subcutaneous Q30 days  . cyclobenzaprine  5 mg Oral QHS  . darbepoetin (ARANESP) injection - NON-DIALYSIS  60 mcg Subcutaneous Q Wed-1800  . tamsulosin  0.4 mg Oral Daily   And  . dutasteride  0.5 mg Oral Daily  . latanoprost  1 drop Both Eyes q morning - 10a  . levalbuterol  1.25 mg Nebulization 4 times per day  . metoprolol tartrate  25 mg Oral 4 times per day  . montelukast  10 mg Oral QHS  . pantoprazole  40 mg Oral Q1200  . sulfamethoxazole-trimethoprim  2.5 tablet Oral 3 times per day    Time spent on care of this patient: 40 mins   Allie Bossier , MD   [facility] Office  [phone] Pager - [phone]  On-Call/Text Page:      [email or web address]      password [email or web address]  If 7PM-7AM, please contact night-coverage [email or web address] Password [email or web address] 04/28/2014, 12:40 PM   LOS: 11  days

## 2014-04-28 NOTE — Progress Notes (Signed)
eLink Physician-Brief Progress Note Patient Name: Bryan Hubbard DOB: 12/27/1940 MRN: [id number]  Date of Service  04/28/2014   HPI/Events of Note   Noted hyperkalemia, rising WBC  eICU Interventions  Will hold bactrim F/u BMET in AM Will re-address bactrim dosing in AM   Intervention Category Intermediate Interventions: Electrolyte abnormality - evaluation and management  MCQUAID, DOUGLAS 04/28/2014, 8:06 PM

## 2014-04-28 NOTE — Progress Notes (Signed)
ANTIBIOTIC CONSULT NOTE -follow up Pharmacy Consult for septra Indication: Pneumocystis pneumonia  No Known Allergies  Patient Measurements: Height: 5\' 10"  (177.8 cm) Weight: 183 lb 14.4 oz (83.416 kg) IBW/kg (Calculated) : 73   Vital Signs: Temp: 97.5 F (36.4 C) (08/01 1235) Temp src: Oral (08/01 1235) BP: 120/56 mmHg (08/01 1235) Pulse Rate: 54 (08/01 0600) Intake/Output from previous day: 07/31 0701 - 08/01 0700 In: 712 [P.O.:660; IV Piggyback:52] Out: 1150 [Urine:1150] Intake/Output from this shift:    Labs:  Recent Labs  04/26/14 0251 04/27/14 0311 04/28/14 0218  WBC 5.4 5.0 7.9  HGB 8.3* 7.7* 8.1*  PLT PLATELET CLUMPS NOTED ON SMEAR, COUNT APPEARS DECREASED 85* 112*  CREATININE 1.77* 1.86* 2.39*   Estimated Creatinine Clearance: 28 ml/min (by C-G formula based on Cr of 2.39). No results found for this basename: VANCOTROUGH, VANCOPEAK, VANCORANDOM, GENTTROUGH, GENTPEAK, GENTRANDOM, TOBRATROUGH, TOBRAPEAK, TOBRARND, AMIKACINPEAK, AMIKACINTROU, AMIKACIN,  in the last 72 hours   Microbiology: Recent Results (from the past 720 hour(s))  CULTURE, BLOOD (ROUTINE X 2)     Status: None   Collection Time    03/30/14  8:15 PM      Result Value Ref Range Status   Specimen Description BLOOD LEFT ANTECUBITAL   Final   Special Requests BOTTLES DRAWN AEROBIC AND ANAEROBIC 3CC   Final   Culture  Setup Time     Final   Value: 03/31/2014 02:17     Performed at [facility]   Culture     Final   Value: NO GROWTH 5 DAYS     Performed at [facility]   Report Status 04/06/2014 FINAL   Final  CULTURE, BLOOD (ROUTINE X 2)     Status: None   Collection Time    03/30/14  8:20 PM      Result Value Ref Range Status   Specimen Description BLOOD RIGHT FOREARM   Final   Special Requests BOTTLES DRAWN AEROBIC AND ANAEROBIC 5CC   Final   Culture  Setup Time     Final   Value: 03/31/2014 02:19     Performed at [facility]   Culture     Final   Value: NO  GROWTH 5 DAYS     Performed at [facility]   Report Status 04/06/2014 FINAL   Final  URINE CULTURE     Status: None   Collection Time    03/30/14  9:00 PM      Result Value Ref Range Status   Specimen Description URINE, CLEAN CATCH   Final   Special Requests NONE   Final   Culture  Setup Time     Final   Value: 03/31/2014 02:21     Performed at [place]     Final   Value: NO GROWTH     Performed at [facility]   Culture     Final   Value: NO GROWTH     Performed at [facility]   Report Status 04/01/2014 FINAL   Final  MRSA PCR SCREENING     Status: None   Collection Time    03/31/14 12:49 AM      Result Value Ref Range Status   MRSA by PCR NEGATIVE  NEGATIVE Final   Comment:            The GeneXpert MRSA Assay (FDA     approved for NASAL specimens     only), is  one component of a     comprehensive MRSA colonization     surveillance program. It is not     intended to diagnose MRSA     infection nor to guide or     monitor treatment for     MRSA infections.  CULTURE, BLOOD (ROUTINE X 2)     Status: None   Collection Time    04/18/14  8:35 AM      Result Value Ref Range Status   Specimen Description BLOOD RIGHT ANTECUBITAL   Final   Special Requests BOTTLES DRAWN AEROBIC AND ANAEROBIC 10CC   Final   Culture  Setup Time     Final   Value: 04/18/2014 13:03     Performed at [facility]   Culture     Final   Value: NO GROWTH 5 DAYS     Performed at [facility]   Report Status 04/24/2014 FINAL   Final  CULTURE, BLOOD (ROUTINE X 2)     Status: None   Collection Time    04/18/14  8:50 AM      Result Value Ref Range Status   Specimen Description BLOOD LEFT HAND   Final   Special Requests BOTTLES DRAWN AEROBIC AND ANAEROBIC 5CC   Final   Culture  Setup Time     Final   Value: 04/18/2014 13:02     Performed at [facility]   Culture     Final   Value: NO GROWTH 5 DAYS     Performed at [facility]   Report Status 04/24/2014 FINAL   Final  URINE CULTURE     Status: None   Collection Time    04/18/14 10:12 AM      Result Value Ref Range Status   Specimen Description URINE, CLEAN CATCH   Final   Special Requests NONE   Final   Culture  Setup Time     Final   Value: 04/18/2014 15:33     Performed at [place]     Final   Value: 20,OOO COLONIES/ML     Performed at [facility]   Culture     Final   Value: STAPHYLOCOCCUS SPECIES (COAGULASE NEGATIVE)     Note: RIFAMPIN AND GENTAMICIN SHOULD NOT BE USED AS SINGLE DRUGS FOR TREATMENT OF STAPH INFECTIONS.     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  Septra increased to treatment dose 7/30.  No fevers noted, wbc remains normal at 7.9. Creatinine trending back up again. 1.77>1.85>>2.39.  K elevated at 5.7 as well.   Goal of Therapy:  Treatment of infection  Plan:  Decrease Septra 5mg /kg q8 hours = 2.5 DS tablets TID to BID schedule due to renal fxn decline Follow renal function closely Watch electrolytes  Eudelia Bunch, Pharm.D. [phone] 04/28/2014 1:55 PM

## 2014-04-28 DEATH — deceased

## 2014-04-29 DIAGNOSIS — I10 Essential (primary) hypertension: Secondary | ICD-10-CM

## 2014-04-29 LAB — POTASSIUM
POTASSIUM: 5.1 meq/L (ref 3.7–5.3)
POTASSIUM: 4.8 meq/L (ref 3.7–5.3)
Potassium: 5 mEq/L (ref 3.7–5.3)
Potassium: 4.8 mEq/L (ref 3.7–5.3)
Potassium: 4.9 mEq/L (ref 3.7–5.3)

## 2014-04-29 LAB — COMPREHENSIVE METABOLIC PANEL
ALT: 20 U/L (ref 0–53)
AST: 28 U/L (ref 0–37)
Albumin: 3.7 g/dL (ref 3.5–5.2)
Alkaline Phosphatase: 41 U/L (ref 39–117)
Anion gap: 20 — ABNORMAL HIGH (ref 5–15)
BILIRUBIN TOTAL: 0.5 mg/dL (ref 0.3–1.2)
BUN: 130 mg/dL — ABNORMAL HIGH (ref 6–23)
CHLORIDE: 87 meq/L — AB (ref 96–112)
CO2: 26 meq/L (ref 19–32)
CREATININE: 3.06 mg/dL — AB (ref 0.50–1.35)
Calcium: 9 mg/dL (ref 8.4–10.5)
GFR calc Af Amer: 22 mL/min — ABNORMAL LOW (ref >90)
GFR, EST NON AFRICAN AMERICAN: 19 mL/min — AB (ref >90)
Glucose, Bld: 112 mg/dL — ABNORMAL HIGH (ref 70–99)
Potassium: 5.3 mEq/L (ref 3.7–5.3)
Sodium: 133 mEq/L — ABNORMAL LOW (ref 137–147)
Total Protein: 5.1 g/dL — ABNORMAL LOW (ref 6.0–8.3)

## 2014-04-29 LAB — CBC WITH DIFFERENTIAL/PLATELET
BASOS ABS: 0 10*3/uL (ref 0.0–0.1)
Basophils Relative: 0 % (ref 0–1)
Eosinophils Absolute: 0 10*3/uL (ref 0.0–0.7)
Eosinophils Relative: 0 % (ref 0–5)
HCT: 24 % — ABNORMAL LOW (ref 39.0–52.0)
Hemoglobin: 8.1 g/dL — ABNORMAL LOW (ref 13.0–17.0)
LYMPHS ABS: 0.5 10*3/uL — AB (ref 0.7–4.0)
Lymphocytes Relative: 6 % — ABNORMAL LOW (ref 12–46)
MCH: 32 pg (ref 26.0–34.0)
MCHC: 33.8 g/dL (ref 30.0–36.0)
MCV: 94.9 fL (ref 78.0–100.0)
Monocytes Absolute: 0.2 10*3/uL (ref 0.1–1.0)
Monocytes Relative: 2 % — ABNORMAL LOW (ref 3–12)
NEUTROS PCT: 92 % — AB (ref 43–77)
Neutro Abs: 7.8 10*3/uL — ABNORMAL HIGH (ref 1.7–7.7)
PLATELETS: 156 10*3/uL (ref 150–400)
RBC: 2.53 MIL/uL — AB (ref 4.22–5.81)
RDW: 20.8 % — AB (ref 11.5–15.5)
WBC: 8.4 10*3/uL (ref 4.0–10.5)

## 2014-04-29 LAB — URINALYSIS, ROUTINE W REFLEX MICROSCOPIC
Bilirubin Urine: NEGATIVE
Glucose, UA: NEGATIVE mg/dL
KETONES UR: NEGATIVE mg/dL
NITRITE: NEGATIVE
PH: 5 (ref 5.0–8.0)
Protein, ur: 100 mg/dL — AB
SPECIFIC GRAVITY, URINE: 1.019 (ref 1.005–1.030)
Urobilinogen, UA: 0.2 mg/dL (ref 0.0–1.0)

## 2014-04-29 LAB — MAGNESIUM: MAGNESIUM: 2.7 mg/dL — AB (ref 1.5–2.5)

## 2014-04-29 LAB — URINE MICROSCOPIC-ADD ON

## 2014-04-29 MED ORDER — METOLAZONE 5 MG PO TABS
5.0000 mg | ORAL_TABLET | ORAL | Status: DC
  Administered 2014-04-29 – 2014-05-02 (×4): 5 mg via ORAL
  Filled 2014-04-29 (×5): qty 1

## 2014-04-29 MED ORDER — SODIUM CHLORIDE 0.9 % IV SOLN
250.0000 mg | Freq: Two times a day (BID) | INTRAVENOUS | Status: DC
  Administered 2014-04-29 – 2014-04-30 (×2): 250 mg via INTRAVENOUS
  Filled 2014-04-29 (×3): qty 2

## 2014-04-29 MED ORDER — FUROSEMIDE 10 MG/ML IJ SOLN
100.0000 mg | Freq: Three times a day (TID) | INTRAVENOUS | Status: DC
  Administered 2014-04-29 – 2014-05-02 (×8): 100 mg via INTRAVENOUS
  Filled 2014-04-29 (×12): qty 10

## 2014-04-29 NOTE — Progress Notes (Addendum)
Bryan Hubbard Progress Note   Subjective: does not feel good  Filed Vitals:   04/29/14 0813 04/29/14 1100 04/29/14 1244 04/29/14 1602  BP:   108/60 110/60  Pulse:   60   Temp:   97.8 F (36.6 C) 97.5 F (36.4 C)  TempSrc:   Oral Oral  Resp:      Height:      Weight:      SpO2: 94% 94% 90%    Exam: On high flow nasal O2 Chest bibasilar rales RRR no MRG Abd soft, NTND no ascites Diffuse LE edema 2-3+ bilat, scrotal edema Neuro is nf, Ox 3  UA done by undersigned > brownish urine, Hb 3+ on dip, prot 30, o/w dip neg, microscopic showed sheets of monomorphic nondystrophic RBC's, NO casts, no wbc's,  No RBC casts, no gran casts ECHO EF 40-45%       Assessment: 1 Acute on CRF - creat rising the last 2 days , up to 3.0 today with BUN 130. Markedly vol overloaded with pulm edema on background pulm fibrosis by CT (ground glass changes are all new), anasarca and rising creat which is prob due to decompenstated CHF. Stop IVF"s, start high-dose diuretics, keep BP up over 850 systolic, hold BB if SBP less than 100.  No evidence of GN or even ATN for that matter on exam of urine sediment - suspect his problems are mostly volume related right now.  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  ANCA was + also but the biopsy findings showed primarily Goodpasture's. He was treated with pheresis, steroids, cytoxan and improved. He had pulm hemorrhage and has been shown to have residual pulm fibrosis by CT earlier this month.    Plan- high-dose diuretics, fluid restrict, stop NS at 50/hr, will follow.  If for some reason he doesn't respond to diuretics will likely need CRRT.     Kelly Splinter MD  pager [phone]    cell [phone]  04/29/2014, 4:33 PM     Recent Labs Lab 04/27/14 0311 04/28/14 0218  04/29/14 0750 04/29/14 1004 04/29/14 1422  NA 135* 132*  --  133*  --   --   K 4.8 5.7*  < > 5.3 5.0 4.8  CL 93* 91*  --  87*  --   --    CO2 28 24  --  26  --   --   GLUCOSE 175* 202*  --  112*  --   --   BUN 97* 113*  --  130*  --   --   CREATININE 1.86* 2.39*  --  3.06*  --   --   CALCIUM 8.7 9.2  --  9.0  --   --   < > = values in this interval not displayed.  Recent Labs Lab 04/27/14 0311 04/29/14 0750  AST 17 28  ALT 10 20  ALKPHOS 32* 41  BILITOT 0.7 0.5  PROT 4.7* 5.1*  ALBUMIN 3.7 3.7    Recent Labs Lab 04/27/14 0311 04/28/14 0218 04/29/14 0750  WBC 5.0 7.9 8.4  NEUTROABS  --   --  7.8*  HGB 7.7* 8.1* 8.1*  HCT 22.4* 23.5* 24.0*  MCV 94.9 93.6 94.9  PLT 85* 112* 156   . antiseptic oral rinse  15 mL Mouth Rinse BID  . clonazePAM  0.25 mg Oral BID  . [START ON 05/10/2014] cyanocobalamin  1,000 mcg Subcutaneous Q30 days  . cyclobenzaprine  5 mg Oral QHS  . darbepoetin (ARANESP) injection - NON-DIALYSIS  60 mcg Subcutaneous Q Wed-1800  . tamsulosin  0.4 mg Oral Daily   And  . dutasteride  0.5 mg Oral Daily  . latanoprost  1 drop Both Eyes q morning - 10a  . levalbuterol  1.25 mg Nebulization 4 times per day  . methylPREDNISolone (SOLU-MEDROL) injection  250 mg Intravenous Q12H  . metoprolol tartrate  25 mg Oral 4 times per day  . montelukast  10 mg Oral QHS  . pantoprazole  40 mg Oral Q1200   . sodium chloride 50 mL/hr at 04/28/14 1437   acetaminophen, bisacodyl, docusate sodium, levalbuterol, magnesium hydroxide, metoprolol, ondansetron (ZOFRAN) IV, polyethylene glycol, sodium chloride, traMADol-acetaminophen

## 2014-04-29 NOTE — Progress Notes (Signed)
PULMONARY / CRITICAL CARE MEDICINE  Name: Bryan Hubbard MRN: [id number] DOB: 1941/05/12    ADMISSION DATE:  04/15/2014 CONSULTATION DATE:  03/31/2014  REFERRING MD :  Thereasa Solo  PRIMARY SERVICE:  TRH  CHIEF COMPLAINT:  Hypoxemic respiratory failure  BRIEF PATIENT DESCRIPTION: [name] with PMH of Goodpastures - O2 dependent, presents with worsening SOB, dizziness, weakness and LE swelling.  Cards consulted and did not feel that SOB was due to a primary cardiac reason.  PCCM was consulted for further evaluation.  SIGNIFICANT EVENTS / STUDIES:  3/27 TTE >>> PAP 45 torr 7/3   Chest CT >>> bilateral interstitial lung abnormality without nodules or consolidation, no effusion  7/7   VQ scan >>> intermediate probability of PE, new acute perfusion defect LUL since prior VQ in 12/2013  7/7   PFT >>> restrictive pattern, severe diffusion impairment, significant bronchodilator responce  7/8   Venous duplex >>> no clot  7/24 DC cardioversion 7/27 CT chest high resolution> worsening traction bronchiectasis and diffuse GGO worrisome for pneumonitis/Goodpasture syndrome 7/28 plasmapheresis x2 sessions through 7/29 7/29 Anti GBM, mpo, pr3 ab all negative 7/30 Cytoxan stopped  LINES / TUBES: PIV  CULTURES: Urine 7/22 >> coag neg staph (likely contaminant) Blood 7/22 >> neg  ANTIBIOTICS: 7/30 bactrim >>  INTERVAL HISTORY:   Less alert today  VITAL SIGNS: Temp:  [97.3 F (36.3 C)-97.9 F (36.6 C)] 97.9 F (36.6 C) (08/02 0730) Pulse Rate:  [59-67] 59 (08/02 0730) Resp:  [14-29] 14 (08/02 0327) BP: (103-124)/(51-72) 114/62 mmHg (08/02 0730) SpO2:  [83 %-100 %] 94 % (08/02 0813) FiO2 (%):  [60 %-65 %] 65 % (08/02 0813) Weight:  [184 lb 11.9 oz (83.8 kg)] 184 lb 11.9 oz (83.8 kg) (08/02 0500)  HEMODYNAMICS: CV stable    VENTILATOR SETTINGS: Vent Mode:  [-]  FiO2 (%):  [60 %-65 %] 65 %  INTAKE / OUTPUT: Intake/Output     08/01 0701 - 08/02 0700 08/02 0701 - 08/03 0700   P.O. 690     I.V. (mL/kg) 669.2 (8)    IV Piggyback 100    Total Intake(mL/kg) 1459.2 (17.4)    Urine (mL/kg/hr) 725 (0.4)    Total Output 725     Net +734.2          Stool Occurrence 2 x     PHYSICAL EXAMINATION: General: no acute distress HEENT: NCAT, high flow O2 oximyzer , OP clear PULM: crackles in bases bilaterally CV: RRR, no mgr, no JVD AB: BS+, soft, nontender Ex: edema R leg > L  Neuro: A&Ox4, MAEx4, dull affect, looks tired  LABS: I have reviewed all of today's lab results. Relevant abnormalities are discussed in the A/P section  CXR: none for today  ASSESSMENT / PLAN:  Acute hypoxemic respiratory failure due to: -ILD > most worrisome for cytoxan toxicity, less likely PCP   -PE, s/p IVC filter -pulmonary edema, weight up 8/2 Filed Weights   04/27/14 0300 04/28/14 0600 04/29/14 0500  Weight: 186 lb (84.369 kg) 183 lb 14.4 oz (83.416 kg) 184 lb 11.9 oz (83.8 kg)   No cytoxan, this was d/c If he worsens to vent and is intubated, not likely to survive.  Would advocate DNR status. 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 He is considering no intubation.    8/2 wife updated.  Richardson Landry Minor ACNP [facility] PCCM Pager [phone] till 3 pm If no answer page [phone] 04/29/2014, 11:26 AM   Attending:  I have seen and examined the patient with nurse practitioner/resident and agree with the note above.   D/C bactrim No real change in over a week, suspect any healing will take weeks He will not do well with mechanical ventilation Profound  deconditioning Will discuss code status with he and wife  If renal function stabilizes and oxygenation unchanged, may be candidate for LTAC this week  Roselie Awkward, MD [facility] Pager: [phone] Cell: [phone] If no response, call [phone]

## 2014-04-29 NOTE — Progress Notes (Signed)
[place] TEAM 1 - Stepdown/ICU TEAM Progress Note  Bryan Hubbard BMW:[id number] DOB: 01-18-1941 DOA: 04/09/2014 PCP: [name], MD  Admit HPI / Brief Narrative: 73 yo WM PMHx chronic atrial flutter, DVT, Goodpasture's, and recent oxygen dependent respiratory failure after hospitalization for community acquired pneumonia last month who presented to the hospital with progressive shortness of breath and lower extremity edema. In addition he was becoming very dizzy and very weak with standing. He notified his cardiologist of these symptoms several days prior to presentation and was instructed to decrease his metoprolol to 25 mg by mouth twice a day. Unfortunately his symptoms did not improve.  He presented to the hospital where it was noted that his systolic blood pressure was in the upper 80s and upon standing dropped into the 70s. Review of patient medication revealed he was taking Lasix 20 mg daily and had continued to take this medication prior to presentation. In addition he endorsed having to increase his oxygen at home from 2 L to 4 L because of progressive shortness of breath. A two-view chest x-ray demonstrated acutely worsened diffuse bilateral airspace opacification with a peripheral distribution and diffuse increased interstitial markings that could either be acute infectious or inflammatory interstitial pneumonitis.    HPI/Subjective: 8/2 patient resting comfortably in bed, states he feels that his breathing is no better than yesterday.    Assessment/Plan: Atrial flutter with rapid ventricular response-maintaining NSR  -as per Cardiology -they will follow at a distance since has been stable  -Initially failed attempts to use cardizem, BB, or both to control RVR  -Ibutilide infusion was unsuccessful > DCCV successful under bedside anesthesia  -maintaining NSR at this time  -since had prolonged RVR we checked ECHO which demonstrated a decrease in systolic function from 27-25% to  40-45%  -Eliquis remains on hold for now- initially due to expected clotting issues with plasmapheresis but also holding to ensure no further melena -Platelets low normal  Goodpasture's  -chronically treated with steroid, oral cytoxan and prn plasmapheresis  -per Pulmonary chest x-ray was consistent with interstitial lung disease of uncertain etiology - due to current respiratory compromise unable to proceed with biopsy. -Patient subsequently underwent plasmapheresis catheter insertion 7/28 by IR and began plasmapheresis (see below) but serologies were negative for Goodpastures etiology so plasmapheresis dcd 7/30 and catheter discontinued 7/31 per IR  -Pulmonary symptoms most likely secondary to Cytoxan toxicity; PCCM. Stopped Cytoxan  -Continue Solu-Medrol per [facility] M.   Acute on chronic respiratory failure with hypoxia:  -Patient continues to require high flow nasal cannula in order to maintain SpO2 greater than 92% while resting -Per Dr. Roselie Awkward PCCM. 8./2 patient and wife still considering DNR  Acute systolic CHF  -See echocardiogram below   Opportunistic infection  -Initially attempted to gently diurese but unfortunately only achieved worsened renal function without any improvement in respiratory status  -High resolution CT Chest 7/27 ordered by pulmonary with findings c/w Goodpastures and plasmapheresis initiated 7/28 and as of 7/29 respiratory sx's appear to stabilized  -7/30 pulmonary also increasing steroid dose again- pt declined bronchoscopy   -Pulmonary also concerned over cytoxan mediated fibrosis so Cytoxan was dc'd (7/30); Solu-Medrol per pulmonology  -Secondary to patient's hyperkalemia on 8/2 Bactrim DC'd by PCCM.    Glomerulonephritis /Acute kidney Injury on Chronic kidney disease stage 3, GFR 30-59 ml/min  -BUN/creatinine continues to worsen, overnight try to gently hydrate patient with normal saline without improvement. Spoke with Dr. Roney Jaffe (nephrology)  who will see patient and give  recommendations  Transient melena  -Occurred overnight into 7/30 and as of 7/31 has not recurred   Hypoxemic mediated anxiety  -Klonopin has helped significantly with sx's   Abnormal UA  -cx positive only for 20,000 colonies of coag neg staph - further tx not currently indicated   Hematuria (recurrent)  -pt states this is a recurring problem, all anticoagulants on hold -8./2 Urinalysis shows continued hematuria   Anemia of chronic disease  -Continue to hold stable - baseline between 7.9 and 9   Thrombocytopenia  - stable no overt signs of hemorrhage   HYPERLIPIDEMIA   HYPERTENSION  -Hypotensive for a 73 year old  - Given patient's systolic CHF, and renal failure will continue to gently hydrate normal saline at 84ml/hr -Awaiting nephrology recommendations  History of DVT / PE / IVC filter  -was transitioned from warfarin to eliquis during the last hospitalization at recommendation of the Pulmonologist in setting of inability to maintain therapeutic INR on warfarin. -Eliquis on hold  Hyperkalemia -Most likely caused by addition of Bactrim to treat empirically PCP; [facility] M. DC'd on 8/2 -Resolving, will DC Kayexalate    Code Status: FULL Family Communication: no family present at time of exam Disposition Plan: Per [facility] M.    Consultants: Dr. Saralyn Pilar Wright/Dr. Roselie Awkward [facility] M.) Dr. Edrick Oh (nephrology)   Procedure/Significant Events: 7/29 echocardiogram - Left ventricle: mild LVH. LVEF= 40% to 45%.  - Mitral valve:  mild regurgitation. - Tricuspid valve:  moderate regurgitation.    Culture Urine culture coag neg staph   Antibiotics: Bactrim 8/1>>  DVT prophylaxis: SCD   Devices NA   LINES / TUBES:  7/29 22ga right forearm    Continuous Infusions: . sodium chloride 50 mL/hr at 04/28/14 1437    Objective: VITAL SIGNS: Temp: 97.5 F (36.4 C) (08/02 1602) Temp src: Oral (08/02 1602) BP: 110/60 mmHg (08/02  1602) Pulse Rate: 60 (08/02 1244) SPO2; 94% on high flow nasal cannula, 40 L O2 FIO2: 65%   Intake/Output Summary (Last 24 hours) at 04/29/14 1605 Last data filed at 04/29/14 1400  Gross per 24 hour  Intake   1592 ml  Output    725 ml  Net    867 ml     Exam: General: A./O. x4, NAD, No acute respiratory distress Lungs: Clear to auscultation bilaterally without wheezes or crackles Cardiovascular: Regular rate and rhythm without murmur gallop or rub normal S1 and S2 Abdomen: Nontender, nondistended, soft, bowel sounds positive, no rebound, no ascites, no appreciable mass Extremities: No significant cyanosis, clubbing, or edema bilateral lower extremities  Data Reviewed: Basic Metabolic Panel:  Recent Labs Lab 04/25/14 0236 04/25/14 1318 04/26/14 0251 04/27/14 0311 04/28/14 0218  04/28/14 2155 04/29/14 0230 04/29/14 0750 04/29/14 1004 04/29/14 1422  NA 137 132* 137 135* 132*  --   --   --  133*  --   --   K 4.2 4.4 4.2 4.8 5.7*  < > 5.6* 5.1 5.3 5.0 4.8  CL 94* 91* 96 93* 91*  --   --   --  87*  --   --   CO2 22  --  25 28 24   --   --   --  26  --   --   GLUCOSE 180* 213* 139* 175* 202*  --   --   --  112*  --   --   BUN 120* 121* 98* 97* 113*  --   --   --  130*  --   --  CREATININE 2.15* 2.70* 1.77* 1.86* 2.39*  --   --   --  3.06*  --   --   CALCIUM 8.9  --  8.6 8.7 9.2  --   --   --  9.0  --   --   MG  --   --   --   --   --   --   --   --  2.7*  --   --   < > = values in this interval not displayed. Liver Function Tests:  Recent Labs Lab 04/27/14 0311 04/29/14 0750  AST 17 28  ALT 10 20  ALKPHOS 32* 41  BILITOT 0.7 0.5  PROT 4.7* 5.1*  ALBUMIN 3.7 3.7   No results found for this basename: LIPASE, AMYLASE,  in the last 168 hours No results found for this basename: AMMONIA,  in the last 168 hours CBC:  Recent Labs Lab 04/25/14 0236 04/25/14 1318 04/26/14 0251 04/27/14 0311 04/28/14 0218 04/29/14 0750  WBC 4.7  --  5.4 5.0 7.9 8.4  NEUTROABS   --   --   --   --   --  7.8*  HGB 9.0* 9.5* 8.3* 7.7* 8.1* 8.1*  HCT 26.8* 28.0* 24.8* 22.4* 23.5* 24.0*  MCV 95.4  --  95.8 94.9 93.6 94.9  PLT 119*  --  PLATELET CLUMPS NOTED ON SMEAR, COUNT APPEARS DECREASED 85* 112* 156   Cardiac Enzymes: No results found for this basename: CKTOTAL, CKMB, CKMBINDEX, TROPONINI,  in the last 168 hours BNP (last 3 results)  Recent Labs  03/05/14 0938 03/30/14 2015 04/24/2014 2027  PROBNP 573.0* 7170.0* 6768.0*   CBG:  Recent Labs Lab 04/24/14 0813  GLUCAP 101*    No results found for this or any previous visit (from the past 240 hour(s)).   Studies:  Recent x-ray studies have been reviewed in detail by the Attending Physician  Scheduled Meds:  Scheduled Meds: . antiseptic oral rinse  15 mL Mouth Rinse BID  . clonazePAM  0.25 mg Oral BID  . [START ON 05/10/2014] cyanocobalamin  1,000 mcg Subcutaneous Q30 days  . cyclobenzaprine  5 mg Oral QHS  . darbepoetin (ARANESP) injection - NON-DIALYSIS  60 mcg Subcutaneous Q Wed-1800  . tamsulosin  0.4 mg Oral Daily   And  . dutasteride  0.5 mg Oral Daily  . latanoprost  1 drop Both Eyes q morning - 10a  . levalbuterol  1.25 mg Nebulization 4 times per day  . methylPREDNISolone (SOLU-MEDROL) injection  250 mg Intravenous Q12H  . metoprolol tartrate  25 mg Oral 4 times per day  . montelukast  10 mg Oral QHS  . pantoprazole  40 mg Oral Q1200  . sodium polystyrene  60 g Oral BID    Time spent on care of this patient: 40 mins   Allie Bossier , MD   [facility] Office  [phone] Pager [phone]  On-Call/Text Page:      [email or web address]      password [email or web address]  If 7PM-7AM, please contact night-coverage [email or web address] Password [email or web address] 04/29/2014, 4:05 PM   LOS: 12 days

## 2014-04-30 ENCOUNTER — Inpatient Hospital Stay (HOSPITAL_COMMUNITY): Payer: Medicare Other

## 2014-04-30 LAB — BASIC METABOLIC PANEL
Anion gap: 22 — ABNORMAL HIGH (ref 5–15)
BUN: 140 mg/dL — AB (ref 6–23)
CHLORIDE: 89 meq/L — AB (ref 96–112)
CO2: 24 mEq/L (ref 19–32)
CREATININE: 3.19 mg/dL — AB (ref 0.50–1.35)
Calcium: 8.6 mg/dL (ref 8.4–10.5)
GFR calc non Af Amer: 18 mL/min — ABNORMAL LOW (ref >90)
GFR, EST AFRICAN AMERICAN: 21 mL/min — AB (ref >90)
Glucose, Bld: 127 mg/dL — ABNORMAL HIGH (ref 70–99)
Potassium: 4.5 mEq/L (ref 3.7–5.3)
Sodium: 135 mEq/L — ABNORMAL LOW (ref 137–147)

## 2014-04-30 LAB — CBC
HEMATOCRIT: 23.3 % — AB (ref 39.0–52.0)
Hemoglobin: 8 g/dL — ABNORMAL LOW (ref 13.0–17.0)
MCH: 32.4 pg (ref 26.0–34.0)
MCHC: 34.3 g/dL (ref 30.0–36.0)
MCV: 94.3 fL (ref 78.0–100.0)
Platelets: 133 10*3/uL — ABNORMAL LOW (ref 150–400)
RBC: 2.47 MIL/uL — ABNORMAL LOW (ref 4.22–5.81)
RDW: 20.8 % — AB (ref 11.5–15.5)
WBC: 7 10*3/uL (ref 4.0–10.5)

## 2014-04-30 LAB — POTASSIUM: Potassium: 4.8 mEq/L (ref 3.7–5.3)

## 2014-04-30 LAB — MAGNESIUM: Magnesium: 2.7 mg/dL — ABNORMAL HIGH (ref 1.5–2.5)

## 2014-04-30 LAB — PHOSPHORUS: Phosphorus: 8.9 mg/dL — ABNORMAL HIGH (ref 2.3–4.6)

## 2014-04-30 MED ORDER — APIXABAN 5 MG PO TABS
5.0000 mg | ORAL_TABLET | Freq: Two times a day (BID) | ORAL | Status: DC
  Administered 2014-04-30 – 2014-05-01 (×2): 5 mg via ORAL
  Filled 2014-04-30 (×3): qty 1

## 2014-04-30 MED ORDER — METHYLPREDNISOLONE SODIUM SUCC 125 MG IJ SOLR
60.0000 mg | Freq: Two times a day (BID) | INTRAMUSCULAR | Status: DC
  Administered 2014-04-30 – 2014-05-02 (×4): 60 mg via INTRAVENOUS
  Filled 2014-04-30: qty 0.96
  Filled 2014-04-30: qty 2
  Filled 2014-04-30 (×2): qty 0.96
  Filled 2014-04-30: qty 2
  Filled 2014-04-30: qty 0.96

## 2014-04-30 MED ORDER — NEPRO/CARBSTEADY PO LIQD
237.0000 mL | Freq: Two times a day (BID) | ORAL | Status: DC
  Administered 2014-04-30 – 2014-05-06 (×9): 237 mL via ORAL
  Filled 2014-04-30 (×23): qty 237

## 2014-04-30 MED ORDER — CALCIUM ACETATE 667 MG PO CAPS
667.0000 mg | ORAL_CAPSULE | Freq: Three times a day (TID) | ORAL | Status: DC
  Administered 2014-04-30 – 2014-05-08 (×18): 667 mg via ORAL
  Filled 2014-04-30 (×27): qty 1

## 2014-04-30 NOTE — Progress Notes (Signed)
Physical Therapy Treatment Patient Details Name: Bryan Hubbard MRN: [id number] DOB: 02-24-1941 Today's Date: 04/30/2014    History of Present Illness This 73 y.o. 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 LE edema. Diagnosis:  CHF exacerbation; A-fib; orthostatic hypotension; NSTEMI.  He underwent cardioversion 04/06/2014 due to aflutter with RVR.  He has continued with chronic hypoxemia respiratory failure presumed due to PE, pulmonary edema and underlying parenchymal lung disease (Goodpature syndrome).  Chest CT 04/23/14 with questionable worsening Goodpasture's syndrome, but  he had normal antibody panel making Goodpastures unlikely. Underwent plasmapheresis x 2.  He has continued Hypoxemia and is currently (04/27/14) on High flow 02 and PT/OT were ordered to attempt EOB activity.      PT Comments    Unable to clear hips from bed when attempting standing. Pt very limited by fatigue and pulmonary status.   Follow Up Recommendations  SNF     Equipment Recommendations  Other (comment) (to be determined)    Recommendations for Other Services       Precautions / Restrictions Precautions Precautions: Fall;Other (comment) Precaution Comments: high flow O2    Mobility  Bed Mobility Overal bed mobility: Needs Assistance Bed Mobility: Rolling;Supine to Sit Rolling: Mod assist   Supine to sit: +2 for physical assistance;Mod assist Sit to supine: Mod assist;+2 for physical assistance   General bed mobility comments: Assist to move legs over and to elevate trunk into sitting. Assist to bring legs back up into bed and lower trunk into supine.  Transfers Overall transfer level: Needs assistance Equipment used: Ambulation equipment used Bryan Hubbard)             General transfer comment: Attempted sit to stand with  Stedy with +2 assist. Unable to clear hips from bed. Pt with horizontal translation of hips but no vertical translation of hips. Returned to supine and used maximove for bed to chair transfer.  Ambulation/Gait                 Stairs            Wheelchair Mobility    Modified Rankin (Stroke Patients Only)       Balance   Sitting-balance support: Feet supported;Bilateral upper extremity supported Sitting balance-Leahy Scale: Poor Sitting balance - Comments: Needs UE support to sit EOB with min guard assist.                            Cognition Arousal/Alertness: Awake/alert Behavior During Therapy: WFL for tasks assessed/performed Overall Cognitive Status: Within Functional Limits for tasks assessed                      Exercises      General Comments        Pertinent Vitals/Pain SaO2- 76% on high flow O2 with activity. Returned to 90% at rest.    Home Living                      Prior Function            PT Goals (current goals can now be found in the care plan section) Progress towards PT goals: Progressing toward goals    Frequency  Min 2X/week    PT Plan Current plan remains appropriate  Co-evaluation             End of Session Equipment Utilized During Treatment: Oxygen Mayo Ao, Stedy) Activity Tolerance: Patient limited by fatigue Patient left: in chair;with call bell/phone within reach;with family/visitor present     Time: 3887-1959 PT Time Calculation (min): 45 min  Charges:  $Therapeutic Activity: 38-52 mins                    G Codes:      Challen Spainhour 2014-05-16, 11:18 AM  [facility] PT [phone]

## 2014-04-30 NOTE — Progress Notes (Signed)
PULMONARY / CRITICAL CARE MEDICINE  Name: Bryan Hubbard MRN: [id number] DOB: [birth year]    ADMISSION DATE:  04/09/2014 CONSULTATION DATE:  03/31/2014  REFERRING MD :  Thereasa Solo  PRIMARY SERVICE:  TRH  CHIEF COMPLAINT:  Hypoxemic respiratory failure  BRIEF PATIENT DESCRIPTION: [name] with PMH of Goodpastures - O2 dependent, presents with worsening SOB, dizziness, weakness and LE swelling.  Cards consulted and did not feel that SOB was due to a primary cardiac reason.  PCCM was consulted for further evaluation.  SIGNIFICANT EVENTS / STUDIES:  3/27 TTE >>> PAP 45 torr 7/3   Chest CT >>> bilateral interstitial lung abnormality without nodules or consolidation, no effusion  7/7   VQ scan >>> intermediate probability of PE, new acute perfusion defect LUL since prior VQ in 12/2013  7/7   PFT >>> restrictive pattern, severe diffusion impairment, significant bronchodilator responce  7/8   Venous duplex >>> no clot  7/24 DC cardioversion 7/27 CT chest high resolution> worsening traction bronchiectasis and diffuse GGO worrisome for pneumonitis/Goodpasture syndrome 7/28 plasmapheresis x2 sessions through 7/29 7/29 Anti GBM, mpo, pr3 ab all negative 7/30 Cytoxan stopped  LINES / TUBES:  CULTURES: Urine 7/22 >> coag neg staph (likely contaminant) Blood 7/22 >> neg  ANTIBIOTICS: 7/30 bactrim >>> ???  INTERVAL HISTORY:  Feels tired  VITAL SIGNS: Temp:  [97.5 F (36.4 C)-97.9 F (36.6 C)] 97.5 F (36.4 C) (08/03 1210) Pulse Rate:  [51-67] 67 (08/03 1202) Resp:  [12-23] 15 (08/03 1210) BP: (107-126)/(54-79) 110/65 mmHg (08/03 1210) SpO2:  [85 %-93 %] 92 % (08/03 1210) FiO2 (%):  [60 %-63 %] 60 % (08/03 1202) Weight:  [87.3 kg (192 lb 7.4 oz)] 87.3 kg (192 lb 7.4 oz) (08/03 0314)  HEMODYNAMICS:    VENTILATOR SETTINGS: Vent Mode:  [-]  FiO2 (%):  [60 %-63 %] 60 %  INTAKE / OUTPUT: Intake/Output     08/02 0701 - 08/03 0700 08/03 0701 - 08/04 0700   P.O. 360 120   I.V. (mL/kg)  350 (4)    IV Piggyback 104    Total Intake(mL/kg) 814 (9.3) 120 (1.4)   Urine (mL/kg/hr) 1825 (0.9) 350 (0.7)   Total Output 1825 350   Net -1011 -230        Urine Occurrence 1 x     PHYSICAL EXAMINATION: General: Resting in chair HEENT: High flow nasal cannula on PULM: Bilateral air entry, rales CV: Regular, tachycardic  AB: Soft, nontender Ex: Bilateral LE edema Neuro: Awake, alert  LABS: CBC  Recent Labs Lab 04/28/14 0218 04/29/14 0750 04/30/14 0636  WBC 7.9 8.4 7.0  HGB 8.1* 8.1* 8.0*  HCT 23.5* 24.0* 23.3*  PLT 112* 156 133*   Coag's  Recent Labs Lab 04/26/14 0251 04/27/14 0311  APTT 29  --   INR 1.62* 1.21   BMET  Recent Labs Lab 04/28/14 0218  04/29/14 0750  04/29/14 2213 04/30/14 0220 04/30/14 0636  NA 132*  --  133*  --   --   --  135*  K 5.7*  < > 5.3  < > 4.8 4.8 4.5  CL 91*  --  87*  --   --   --  89*  CO2 24  --  26  --   --   --  24  BUN 113*  --  130*  --   --   --  140*  CREATININE 2.39*  --  3.06*  --   --   --  3.19*  GLUCOSE 202*  --  112*  --   --   --  127*  < > = values in this interval not displayed.  Electrolytes  Recent Labs Lab 04/28/14 0218 04/29/14 0750 04/30/14 0636  CALCIUM 9.2 9.0 8.6  MG  --  2.7* 2.7*  PHOS  --   --  8.9*   Sepsis Markers No results found for this basename: LATICACIDVEN, PROCALCITON, O2SATVEN,  in the last 168 hours  ABG No results found for this basename: PHART, PCO2ART, PO2ART,  in the last 168 hours  Liver Enzymes  Recent Labs Lab 04/27/14 0311 04/29/14 0750  AST 17 28  ALT 10 20  ALKPHOS 32* 41  BILITOT 0.7 0.5  ALBUMIN 3.7 3.7   Cardiac Enzymes No results found for this basename: TROPONINI, PROBNP,  in the last 168 hours Glucose  Recent Labs Lab 04/24/14 0813  GLUCAP 101*   IMAGING: Dg Chest Port 1 View  04/30/2014   CLINICAL DATA:  Respiratory failure  EXAM: PORTABLE CHEST - 1 VIEW  COMPARISON:  04/27/2014  FINDINGS: The previously seen central venous catheter on  the right has been removed in the interval. The cardiac shadow is stable. Bilateral interstitial changes are again identified and stable. No new focal infiltrate is seen. No bony abnormality is noted.  IMPRESSION: Stable appearance of the chest following central venous catheter removal.   Electronically Signed   By: Inez Catalina M.D.   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  Doree Fudge, MD Pulmonary and [place] Pager: [phone]  04/30/2014, 1:05 PM

## 2014-04-30 NOTE — Progress Notes (Signed)
Additional Medicare IM copy given to patient. Rito Lecomte RN CCM Case Mgmt phone [phone] 

## 2014-04-30 NOTE — Progress Notes (Signed)
[place] TEAM 1 - Stepdown/ICU TEAM Progress Note  Bryan Hubbard:[id number] DOB: [birth year] DOA: 04/07/2014 PCP: [name], MD  Admit HPI / Brief Narrative: 73 yo WM PMHx chronic atrial flutter, DVT, Goodpasture's, and recent oxygen dependent respiratory failure after hospitalization for community acquired pneumonia last month who presented to the hospital with progressive shortness of breath and lower extremity edema. In addition he was becoming very dizzy and very weak with standing. He notified his cardiologist of these symptoms several days prior to presentation and was instructed to decrease his metoprolol to 25 mg by mouth twice a day. Unfortunately his symptoms did not improve.   He presented to the hospital where it was noted that his systolic blood pressure was in the upper 80s and upon standing dropped into the 70s. Review of patient medication revealed he was taking Lasix 20 mg daily and had continued to take this medication prior to presentation. In addition he endorsed having to increase his oxygen at home from 2 L to 4 L because of progressive shortness of breath. A two-view chest x-ray demonstrated acutely worsened diffuse bilateral airspace opacification with a peripheral distribution and diffuse increased interstitial markings that could either be acute infectious or inflammatory interstitial pneumonitis.   HPI/Subjective: Alert and denies worsening of SOB   Assessment/Plan:  Atrial flutter with rapid ventricular response-maintaining NSR  -as per Cardiology -they will follow at a distance since has been stable  -Initially failed attempts to use cardizem, BB, or both to control RVR  -Ibutilide infusion was unsuccessful > DCCV successful under bedside anesthesia  -maintaining NSR at this time  -since had prolonged RVR we checked ECHO which demonstrated a decrease in systolic function from 44-01% to 40-45%  -Eliquis on hold for now- initially due to expected issues with  lower clotting factors while on plasmapheresis but also held to ensure no further melena -Platelets have recovered and no evidence of bleeding so resume Eliquis  Goodpasture's  -chronically treated with steroid, oral cytoxan and prn plasmapheresis  -per Pulmonary chest x-ray was consistent with interstitial lung disease of uncertain etiology - due to current respiratory compromise unable to proceed with biopsy. -Patient subsequently underwent plasmapheresis catheter insertion 7/28 by IR and began plasmapheresis (see below) but serologies were negative for Goodpastures etiology so plasmapheresis dcd 7/30 and catheter discontinued 7/31 per IR  -Pulmonary symptoms ? secondary to Cytoxan toxicity; PCCM. Stopped Cytoxan  -High dose Solu-Medrol per [facility] M.  Acute on chronic respiratory failure with hypoxia:  -Patient continues to require high flow nasal cannula in order to maintain SpO2 greater than 92% while resting-no improvement in sx's -Per Dr. Roselie Awkward PCCM. 8./2 patient and wife still considering DNR  Acute systolic CHF  -See echocardiogram below   ?? Opportunistic infection  -Initially attempted to gently diurese but unfortunately only achieved worsened renal function without any improvement in respiratory status  -High resolution CT Chest 7/27 ordered by pulmonary with findings c/w Goodpastures and plasmapheresis initiated 7/28 and as of 7/29 respiratory sx's appear to stabilized  -7/30 pulmonary adjusted to very high dose steroids- pt declined bronchoscopy   -Pulmonary also concerned over cytoxan mediated fibrosis so Cytoxan was dc'd (7/30); Solu-Medrol per pulmonology  -Secondary to patient's hyperkalemia and progressive renal failure Bactrim DC'd by PCCM on 8/2   Glomerulonephritis /Acute kidney Injury on Chronic kidney disease stage 3, GFR 30-59 ml/min  -BUN/creatinine continues to worsen, overnight try to gently hydrate patient with normal saline without improvement.  -Nephrology  reconsulted and suspect  may have volume overload so high dose Lasix initiated 8/2 but UOP not significantly increased and renal function continues to worsen  Transient melena  -Occurred overnight into 7/30 and has not recurred   Hypoxemic mediated anxiety  -Klonopin has helped significantly with sx's   Abnormal UA  -cx positive only for 20,000 colonies of coag neg staph - further tx not currently indicated   Hematuria (recurrent)  -pt states this is a recurring problem, all anticoagulants on hold -8./2 Urinalysis showed continued hematuria   Anemia of chronic disease  -Continue to hold stable - baseline between 7.9 and 9   Thrombocytopenia  - stable no overt signs of hemorrhage   HYPERLIPIDEMIA   HYPERTENSION  - 8/2: Given patient's systolic CHF, and renal failure plan was to gently hydrate at 46ml/hr but Nephrology felt volume overloaded so IVFs were dc'd  History of DVT / PE / IVC filter  -was transitioned from warfarin to eliquis during the last hospitalization at recommendation of the Pulmonologist in setting of inability to maintain therapeutic INR on warfarin. -Eliquis on hold  Hyperkalemia -Most likely caused by addition of Bactrim to treat empirically PCP; [facility] M. DC'd on 8/2 -Resolving, will DC Kayexalate  Code Status: FULL Family Communication: Wife at bedside Disposition Plan: Per PCCM.  Consultants: Dr. Saralyn Pilar Wright/Dr. Roselie Awkward (PCCM.) Dr. Edrick Oh (nephrology)  Procedure/Significant Events: 7/29 echocardiogram - Left ventricle: mild LVH. LVEF= 40% to 45%.  - Mitral valve:  mild regurgitation. - Tricuspid valve:  moderate regurgitation.  Culture Urine culture coag neg staph  DVT prophylaxis: SCD + eliquis  Objective: VITAL SIGNS: Temp: 97.5 F (36.4 C) (08/03 1210) Temp src: Oral (08/03 1210) BP: 110/65 mmHg (08/03 1210) Pulse Rate: 67 (08/03 1202) SPO2; 94% on high flow nasal cannula, 40 L O2 FIO2: 65%   Intake/Output Summary  (Last 24 hours) at 04/30/14 1442 Last data filed at 04/30/14 1400  Gross per 24 hour  Intake    532 ml  Output   2575 ml  Net  -2043 ml   Exam: General: No acute respiratory distress Lungs: Coarse to auscultation bilaterally with bibasilar expiratory crackles, HFNC oxygen Cardiovascular: Regular rate and rhythm without murmur gallop or rub normal S1 and S2 Abdomen: Nontender, nondistended, soft, bowel sounds positive, no rebound, no ascites, no appreciable mass Extremities: No significant cyanosis, clubbing, or edema bilateral lower extremities  Data Reviewed: Basic Metabolic Panel:  Recent Labs Lab 04/26/14 0251 04/27/14 0311 04/28/14 0218  04/29/14 0750  04/29/14 1422 04/29/14 1830 04/29/14 2213 04/30/14 0220 04/30/14 0636  NA 137 135* 132*  --  133*  --   --   --   --   --  135*  K 4.2 4.8 5.7*  < > 5.3  < > 4.8 4.9 4.8 4.8 4.5  CL 96 93* 91*  --  87*  --   --   --   --   --  89*  CO2 25 28 24   --  26  --   --   --   --   --  24  GLUCOSE 139* 175* 202*  --  112*  --   --   --   --   --  127*  BUN 98* 97* 113*  --  130*  --   --   --   --   --  140*  CREATININE 1.77* 1.86* 2.39*  --  3.06*  --   --   --   --   --  3.19*  CALCIUM 8.6 8.7 9.2  --  9.0  --   --   --   --   --  8.6  MG  --   --   --   --  2.7*  --   --   --   --   --  2.7*  PHOS  --   --   --   --   --   --   --   --   --   --  8.9*  < > = values in this interval not displayed. Liver Function Tests:  Recent Labs Lab 04/27/14 0311 04/29/14 0750  AST 17 28  ALT 10 20  ALKPHOS 32* 41  BILITOT 0.7 0.5  PROT 4.7* 5.1*  ALBUMIN 3.7 3.7   No results found for this basename: LIPASE, AMYLASE,  in the last 168 hours No results found for this basename: AMMONIA,  in the last 168 hours CBC:  Recent Labs Lab 04/26/14 0251 04/27/14 0311 04/28/14 0218 04/29/14 0750 04/30/14 0636  WBC 5.4 5.0 7.9 8.4 7.0  NEUTROABS  --   --   --  7.8*  --   HGB 8.3* 7.7* 8.1* 8.1* 8.0*  HCT 24.8* 22.4* 23.5* 24.0* 23.3*   MCV 95.8 94.9 93.6 94.9 94.3  PLT PLATELET CLUMPS NOTED ON SMEAR, COUNT APPEARS DECREASED 85* 112* 156 133*   CBG:  Recent Labs Lab 04/24/14 0813  GLUCAP 101*    No results found for this or any previous visit (from the past 240 hour(s)).   Studies:  Recent x-ray studies have been reviewed in detail by the Attending Physician  Scheduled Meds:  Scheduled Meds: . antiseptic oral rinse  15 mL Mouth Rinse BID  . calcium acetate  667 mg Oral TID WC  . clonazePAM  0.25 mg Oral BID  . [START ON 05/10/2014] cyanocobalamin  1,000 mcg Subcutaneous Q30 days  . cyclobenzaprine  5 mg Oral QHS  . darbepoetin (ARANESP) injection - NON-DIALYSIS  60 mcg Subcutaneous Q Wed-1800  . tamsulosin  0.4 mg Oral Daily   And  . dutasteride  0.5 mg Oral Daily  . feeding supplement (NEPRO CARB STEADY)  237 mL Oral BID BM  . furosemide  100 mg Intravenous 3 times per day  . latanoprost  1 drop Both Eyes q morning - 10a  . levalbuterol  1.25 mg Nebulization 4 times per day  . methylPREDNISolone (SOLU-MEDROL) injection  60 mg Intravenous Q12H  . metolazone  5 mg Oral Q24H  . metoprolol tartrate  25 mg Oral 4 times per day  . montelukast  10 mg Oral QHS  . pantoprazole  40 mg Oral Q1200    Time spent on care of this patient: 35 mins   ELLIS,ALLISON L. , ANP   [facility] Office  [phone] Pager - [phone]  On-Call/Text Page:      [email or web address]      password [email or web address]  If 7PM-7AM, please contact night-coverage [email or web address] Password [email or web address] 04/30/2014, 2:42 PM   LOS: 13 days   I have personally examined this patient and reviewed the entire database. I have reviewed the above note, made any necessary editorial changes, and agree with its content.  Cherene Altes, MD [facility]

## 2014-04-30 NOTE — Progress Notes (Signed)
S: no Nausea.  Appetite decreased but he is eating O:BP 122/79  Pulse 53  Temp(Src) 97.9 F (36.6 C) (Oral)  Resp 23  Ht 5\' 10"  (1.778 m)  Wt 87.3 kg (192 lb 7.4 oz)  BMI 27.62 kg/m2  SpO2 85%  Intake/Output Summary (Last 24 hours) at 04/30/14 0945 Last data filed at 04/30/14 0612  Gross per 24 hour  Intake    814 ml  Output   1825 ml  Net  -1011 ml   Weight change: 3.5 kg (7 lb 11.5 oz) FVC:BSWHQ and alert CVS:RRR Resp: Bil crackles Abd:+ BS NTND Ext: 3+ edema NEURO:CNI Ox3 no asterixis   . antiseptic oral rinse  15 mL Mouth Rinse BID  . clonazePAM  0.25 mg Oral BID  . [START ON 05/10/2014] cyanocobalamin  1,000 mcg Subcutaneous Q30 days  . cyclobenzaprine  5 mg Oral QHS  . darbepoetin (ARANESP) injection - NON-DIALYSIS  60 mcg Subcutaneous Q Wed-1800  . tamsulosin  0.4 mg Oral Daily   And  . dutasteride  0.5 mg Oral Daily  . furosemide  100 mg Intravenous 3 times per day  . latanoprost  1 drop Both Eyes q morning - 10a  . levalbuterol  1.25 mg Nebulization 4 times per day  . methylPREDNISolone (SOLU-MEDROL) injection  250 mg Intravenous Q12H  . metolazone  5 mg Oral Q24H  . metoprolol tartrate  25 mg Oral 4 times per day  . montelukast  10 mg Oral QHS  . pantoprazole  40 mg Oral Q1200   Dg Chest Port 1 View  04/30/2014   CLINICAL DATA:  Respiratory failure  EXAM: PORTABLE CHEST - 1 VIEW  COMPARISON:  04/27/2014  FINDINGS: The previously seen central venous catheter on the right has been removed in the interval. The cardiac shadow is stable. Bilateral interstitial changes are again identified and stable. No new focal infiltrate is seen. No bony abnormality is noted.  IMPRESSION: Stable appearance of the chest following central venous catheter removal.   Electronically Signed   By: Inez Catalina M.D.   On: 04/30/2014 07:06   BMET    Component Value Date/Time   NA 135* 04/30/2014 0636   K 4.5 04/30/2014 0636   CL 89* 04/30/2014 0636   CO2 24 04/30/2014 0636   GLUCOSE 127*  04/30/2014 0636   BUN 140* 04/30/2014 0636   CREATININE 3.19* 04/30/2014 0636   CALCIUM 8.6 04/30/2014 0636   GFRNONAA 18* 04/30/2014 0636   GFRAA 21* 04/30/2014 0636   CBC    Component Value Date/Time   WBC 7.0 04/30/2014 0636   WBC 10.4* 12/29/2011 1003   RBC 2.47* 04/30/2014 0636   RBC 2.70* 01/20/2014 0913   RBC 4.05* 12/29/2011 1003   HGB 8.0* 04/30/2014 0636   HGB 12.9* 12/29/2011 1003   HCT 23.3* 04/30/2014 0636   HCT 38.5 12/29/2011 1003   PLT 133* 04/30/2014 0636   PLT 272 12/29/2011 1003   MCV 94.3 04/30/2014 0636   MCV 94.9 12/29/2011 1003   MCH 32.4 04/30/2014 0636   MCH 31.8 12/29/2011 1003   MCHC 34.3 04/30/2014 0636   MCHC 33.5 12/29/2011 1003   RDW 20.8* 04/30/2014 0636   RDW 14.2 12/29/2011 1003   LYMPHSABS 0.5* 04/29/2014 0750   LYMPHSABS 1.5 12/29/2011 1003   MONOABS 0.2 04/29/2014 0750   MONOABS 1.4* 12/29/2011 1003   EOSABS 0.0 04/29/2014 0750   EOSABS 0.3 12/29/2011 1003   BASOSABS 0.0 04/29/2014 0750   BASOSABS 0.0 12/29/2011 1003  Assessment: 1. 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 BUN high due to steroids.  Scr relatively stable.  UO improved 2. Cardiomyopathy, EF 40% 3. Vol overload 4. Anemia on aranesp Plan: 1. Cont with IV lasix 2. Please wean steroids rapidly if possible.  It is contributing to his high BUN and that will cause uremic Sxs sooner 3. Nepro BID 4. Daily Scr 5. Start PO4 binder   Kojo Liby T

## 2014-05-01 ENCOUNTER — Encounter: Payer: Medicare Other | Admitting: Physician Assistant

## 2014-05-01 DIAGNOSIS — B370 Candidal stomatitis: Secondary | ICD-10-CM

## 2014-05-01 LAB — RENAL FUNCTION PANEL
ANION GAP: 24 — AB (ref 5–15)
Albumin: 3.8 g/dL (ref 3.5–5.2)
BUN: 147 mg/dL — ABNORMAL HIGH (ref 6–23)
CO2: 22 meq/L (ref 19–32)
Calcium: 8.7 mg/dL (ref 8.4–10.5)
Chloride: 85 mEq/L — ABNORMAL LOW (ref 96–112)
Creatinine, Ser: 3.33 mg/dL — ABNORMAL HIGH (ref 0.50–1.35)
GFR calc non Af Amer: 17 mL/min — ABNORMAL LOW (ref >90)
GFR, EST AFRICAN AMERICAN: 19 mL/min — AB (ref >90)
Glucose, Bld: 160 mg/dL — ABNORMAL HIGH (ref 70–99)
Phosphorus: 9.1 mg/dL — ABNORMAL HIGH (ref 2.3–4.6)
Potassium: 3.9 mEq/L (ref 3.7–5.3)
SODIUM: 131 meq/L — AB (ref 137–147)

## 2014-05-01 LAB — CBC
HCT: 24.7 % — ABNORMAL LOW (ref 39.0–52.0)
Hemoglobin: 8.4 g/dL — ABNORMAL LOW (ref 13.0–17.0)
MCH: 32.3 pg (ref 26.0–34.0)
MCHC: 34 g/dL (ref 30.0–36.0)
MCV: 95 fL (ref 78.0–100.0)
Platelets: 150 K/uL (ref 150–400)
RBC: 2.6 MIL/uL — ABNORMAL LOW (ref 4.22–5.81)
RDW: 20.9 % — ABNORMAL HIGH (ref 11.5–15.5)
WBC: 8.8 K/uL (ref 4.0–10.5)

## 2014-05-01 LAB — APTT: aPTT: 25 seconds (ref 24–37)

## 2014-05-01 LAB — HEPARIN LEVEL (UNFRACTIONATED): Heparin Unfractionated: >2.2 [IU]/mL — ABNORMAL HIGH (ref 0.30–0.70)

## 2014-05-01 MED ORDER — NYSTATIN 100000 UNIT/ML MT SUSP
5.0000 mL | Freq: Four times a day (QID) | OROMUCOSAL | Status: DC
  Administered 2014-05-01 – 2014-05-07 (×26): 500000 [IU] via ORAL
  Filled 2014-05-01 (×33): qty 5

## 2014-05-01 MED ORDER — HEPARIN (PORCINE) IN NACL 100-0.45 UNIT/ML-% IJ SOLN
1050.0000 [IU]/h | INTRAMUSCULAR | Status: DC
Start: 2014-05-01 — End: 2014-05-07
  Administered 2014-05-01: 1000 [IU]/h via INTRAVENOUS
  Administered 2014-05-02 – 2014-05-03 (×2): 1150 [IU]/h via INTRAVENOUS
  Administered 2014-05-06: 1050 [IU]/h via INTRAVENOUS
  Filled 2014-05-01 (×14): qty 250

## 2014-05-01 NOTE — Progress Notes (Signed)
[facility] PCCM  I had a discussion with Bryan Hubbard at length today.  His lung disease has not worsened in the last week, but he continues to have severe hypoxemia.  We discussed the role of mechanical ventilation in this situation and I explained to him that should he worsen and deteriorate he would not benefit from mechanical ventilation and in fact it would likely hurt him.    Based on this conversation I have made his code status DNR.  However, this should not be interpreted as a decision to back off on his medical care otherwise.  We should still press on with aggressive measures (PT, renal function monitoring, steroids, etc).    I updated his wife at length about this, she voiced understanding.  Roselie Awkward, MD [place] PCCM Pager: [phone] Cell: [phone] If no response, call [phone]

## 2014-05-01 NOTE — Progress Notes (Signed)
Clinical Social Work Department BRIEF PSYCHOSOCIAL ASSESSMENT 05/01/2014  Patient:  Bryan Hubbard, Bryan Hubbard     Account Number:  [id number]     Admit date:  04/07/2014  Clinical Social Worker:  Adair Laundry  Date/Time:  05/01/2014 10:45 AM  Referred by:  Physician  Date Referred:  05/01/2014 Referred for  SNF Placement   Other Referral:   Interview type:  Patient Other interview type:   Spoke with pt and pt wife at bedside    PSYCHOSOCIAL DATA Living Status:  WIFE Admitted from facility:   Level of care:   Primary support name:  Sherian Maroon [phone] Primary support relationship to patient:  SPOUSE Degree of support available:   Pt has good support    CURRENT CONCERNS Current Concerns  Post-Acute Placement   Other Concerns:    SOCIAL WORK ASSESSMENT / PLAN CSW aware of PT recommendation for SNF and that RNCM is exploring if pt is potential LTACH candidate. CSW visited pt and spoke with pt and pt wife at bedside. Pt laying in bed and having difficulty speaking during assessment. Pt did participate but primary communication was between [place] and pt wife. CSW explained role and current therapy recommendation. CSW did inform pt and pt wife that this is one of a couple of options at this time and other medical team members will visit to [facility] as well if pt is appropriate. CSW explained ST rehab at [facility]. Pt and pt wife expressed being overwhelmed by this discussion. CSW explained that CSW was here to offer support and only discussing SNF right now to provide pt and pt wife the most amount of time to look at facilities should SNF be the decision. Pt and pt wife were understanding of this and thankful that CSW approached this conversation early. CSW explained SNF referral process and pt and pt wife were agreeable to referral being sent to all of [facility]. CSW answered pt wife questions and provided with CSW contact information   Assessment/plan status:  Psychosocial  Support/Ongoing Assessment of Needs Other assessment/ plan:   Information/referral to community resources:   SNF list to be provided with bed offers    PATIENT'S/FAMILY'S RESPONSE TO PLAN OF CARE: Pt and pt wife cooperative and pleasant. Both are agreeable to SNF referral being sent out.       [place], [place]

## 2014-05-01 NOTE — Progress Notes (Signed)
[place] for heparin Indication: atrial fibrillation  No Known Allergies  Patient Measurements: Height: 5\' 10"  (177.8 cm) Weight: 201 lb 4.5 oz (91.3 kg) IBW/kg (Calculated) : 73 Heparin Dosing Weight: 91.3kg  Vital Signs: Temp: 97.3 F (36.3 C) (08/04 1200) Temp src: Oral (08/04 1200) BP: 95/57 mmHg (08/04 1217) Pulse Rate: 58 (08/04 1217)  Labs:  Recent Labs  04/29/14 0750 04/30/14 0636 05/01/14 0240  HGB 8.1* 8.0* 8.4*  HCT 24.0* 23.3* 24.7*  PLT 156 133* 150  CREATININE 3.06* 3.19* 3.33*    Estimated Creatinine Clearance: 22.1 ml/min (by C-G formula based on Cr of 3.33).   Medical History: Past Medical History  Diagnosis Date  . Atrial flutter started in 2011  . Chronic diastolic heart failure   . DVT   . HYPERLIPIDEMIA   . HYPERTENSION, BENIGN   . ALLERGIC ASTHMA   . Palpitations   . H/O Goodpasture's syndrome     "autoimmune disease; affects kidneys and lungs"  . DVT (deep venous thrombosis) 09/2001    "right groin"  . PE (pulmonary embolism) 09/2001  . CHF (congestive heart failure)   . Anemia   . History of blood transfusion     "related to Goodpasture's syndrome; plasmapheresis process"  . Stomach ulcer   . Arthritis     "hands, knees" (03/16/2014)  . Gout   . Renal insufficiency     "related to the Goodpasture's"  . Atrial flutter with rapid ventricular response 03/16/2014  . Acute on chronic diastolic HF (heart failure) in combination with rapid a flutter and anemia 03/18/2014  . Ejection fraction     Medications:  Scheduled:  . antiseptic oral rinse  15 mL Mouth Rinse BID  . calcium acetate  667 mg Oral TID WC  . clonazePAM  0.25 mg Oral BID  . [START ON 05/10/2014] cyanocobalamin  1,000 mcg Subcutaneous Q30 days  . cyclobenzaprine  5 mg Oral QHS  . darbepoetin (ARANESP) injection - NON-DIALYSIS  60 mcg Subcutaneous Q Wed-1800  . tamsulosin  0.4 mg Oral Daily   And  . dutasteride  0.5 mg Oral  Daily  . feeding supplement (NEPRO CARB STEADY)  237 mL Oral BID BM  . furosemide  100 mg Intravenous 3 times per day  . latanoprost  1 drop Both Eyes q morning - 10a  . levalbuterol  1.25 mg Nebulization 4 times per day  . methylPREDNISolone (SOLU-MEDROL) injection  60 mg Intravenous Q12H  . metolazone  5 mg Oral Q24H  . metoprolol tartrate  25 mg Oral 4 times per day  . montelukast  10 mg Oral QHS  . nystatin  5 mL Oral QID  . pantoprazole  40 mg Oral Q1200    Assessment: 73 yo male with history of aflutter and apixiban has been on hold for melena (d/c on 7/27).  Bleeding has resolved and apixiban was restarted 04/30/14. SCr= 3.33 today (up from 3.19 on 8/3 and noted 1.86 on 04/27/14) with continue trend up.  In setting of acute on chronic CKD patient to transition to heparin. Last dose of apixiban was this am at about 9am).  Anticipate that apixiban will falsely elevate heparin levels.  Goal of Therapy:  APTT 66-102 Heparin level= 0.3-0.7 Monitor platelets by anticoagulation protocol: Yes   Plan:  -Begin heparin infusion at 1000 units/hr at 10pm -Daily heparin level and aptt  Hildred Laser, Pharm D 05/01/2014 1:16 PM

## 2014-05-01 NOTE — Progress Notes (Signed)
S: NO N/V   Was up in chair yest O:BP 117/77  Pulse 59  Temp(Src) 98 F (36.7 C) (Oral)  Resp 12  Ht 5\' 10"  (1.778 m)  Wt 91.3 kg (201 lb 4.5 oz)  BMI 28.88 kg/m2  SpO2 94%  Intake/Output Summary (Last 24 hours) at 05/01/14 0723 Last data filed at 05/01/14 0600  Gross per 24 hour  Intake    500 ml  Output   2300 ml  Net  -1800 ml   Weight change: 4 kg (8 lb 13.1 oz) KGY:JEHUD and alert CVS:RRR Resp: Bil crackles but good air movement Abd:+ BS NTND Ext: 2+ edema NEURO:CNI Ox3 no asterixis   . antiseptic oral rinse  15 mL Mouth Rinse BID  . apixaban  5 mg Oral BID  . calcium acetate  667 mg Oral TID WC  . clonazePAM  0.25 mg Oral BID  . [START ON 05/10/2014] cyanocobalamin  1,000 mcg Subcutaneous Q30 days  . cyclobenzaprine  5 mg Oral QHS  . darbepoetin (ARANESP) injection - NON-DIALYSIS  60 mcg Subcutaneous Q Wed-1800  . tamsulosin  0.4 mg Oral Daily   And  . dutasteride  0.5 mg Oral Daily  . feeding supplement (NEPRO CARB STEADY)  237 mL Oral BID BM  . furosemide  100 mg Intravenous 3 times per day  . latanoprost  1 drop Both Eyes q morning - 10a  . levalbuterol  1.25 mg Nebulization 4 times per day  . methylPREDNISolone (SOLU-MEDROL) injection  60 mg Intravenous Q12H  . metolazone  5 mg Oral Q24H  . metoprolol tartrate  25 mg Oral 4 times per day  . montelukast  10 mg Oral QHS  . pantoprazole  40 mg Oral Q1200   Dg Chest Port 1 View  04/30/2014   CLINICAL DATA:  Respiratory failure  EXAM: PORTABLE CHEST - 1 VIEW  COMPARISON:  04/27/2014  FINDINGS: The previously seen central venous catheter on the right has been removed in the interval. The cardiac shadow is stable. Bilateral interstitial changes are again identified and stable. No new focal infiltrate is seen. No bony abnormality is noted.  IMPRESSION: Stable appearance of the chest following central venous catheter removal.   Electronically Signed   By: Inez Catalina M.D.   On: 04/30/2014 07:06   BMET    Component  Value Date/Time   NA 131* 05/01/2014 0240   K 3.9 05/01/2014 0240   CL 85* 05/01/2014 0240   CO2 22 05/01/2014 0240   GLUCOSE 160* 05/01/2014 0240   BUN 147* 05/01/2014 0240   CREATININE 3.33* 05/01/2014 0240   CALCIUM 8.7 05/01/2014 0240   GFRNONAA 17* 05/01/2014 0240   GFRAA 19* 05/01/2014 0240   CBC    Component Value Date/Time   WBC 8.8 05/01/2014 0240   WBC 10.4* 12/29/2011 1003   RBC 2.60* 05/01/2014 0240   RBC 2.70* 01/20/2014 0913   RBC 4.05* 12/29/2011 1003   HGB 8.4* 05/01/2014 0240   HGB 12.9* 12/29/2011 1003   HCT 24.7* 05/01/2014 0240   HCT 38.5 12/29/2011 1003   PLT 150 05/01/2014 0240   PLT 272 12/29/2011 1003   MCV 95.0 05/01/2014 0240   MCV 94.9 12/29/2011 1003   MCH 32.3 05/01/2014 0240   MCH 31.8 12/29/2011 1003   MCHC 34.0 05/01/2014 0240   MCHC 33.5 12/29/2011 1003   RDW 20.9* 05/01/2014 0240   RDW 14.2 12/29/2011 1003   LYMPHSABS 0.5* 04/29/2014 0750   LYMPHSABS 1.5 12/29/2011 1003  MONOABS 0.2 04/29/2014 0750   MONOABS 1.4* 12/29/2011 1003   EOSABS 0.0 04/29/2014 0750   EOSABS 0.3 12/29/2011 1003   BASOSABS 0.0 04/29/2014 0750   BASOSABS 0.0 12/29/2011 1003     Assessment: 1. 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 Scr sl higher but UO remains good 2. Cardiomyopathy, EF 40% 3. Vol overload, improving 4. Anemia on aranesp  5. Hx PE sp IVC filter Plan: 1. Cont with IV lasix 2. Follow renal fx daily.  I am not convinced that a repeat renal bx will be beneficial at this time   Christen Wardrop T

## 2014-05-01 NOTE — Progress Notes (Signed)
Clinical Social Work Department CLINICAL SOCIAL WORK PLACEMENT NOTE 05/01/2014  Patient:  CRISTOVAL, TEALL  Account Number:  [id number] Admit date:  04/04/2014  Clinical Social Worker:  Berton Mount, Latanya Presser  Date/time:  05/01/2014 12:00 N  Clinical Social Work is seeking post-discharge placement for this patient at the following level of care:   [place]   (*CSW will update this form in Epic as items are completed)   05/01/2014  Patient/family provided with [place] Department of Clinical Social Work's list of facilities offering this level of care within the geographic area requested by the patient (or if unable, by the patient's family).  05/01/2014  Patient/family informed of their freedom to choose among providers that offer the needed level of care, that participate in Medicare, Medicaid or managed care program needed by the patient, have an available bed and are willing to accept the patient.  05/01/2014  Patient/family informed of [facility]' ownership interest in [facility], as well as of the fact that they are under no obligation to receive care at this facility.  PASARR submitted to EDS on 05/01/2014 PASARR number received on 05/01/2014  FL2 transmitted to all facilities in geographic area requested by pt/family on  05/01/2014 FL2 transmitted to all facilities within larger geographic area on   Patient informed that his/her managed care company has contracts with or will negotiate with  certain facilities, including the following:     Patient/family informed of bed offers received:   Patient chooses bed at  Physician recommends and patient chooses bed at    Patient to be transferred to  on   Patient to be transferred to facility by  Patient and family notified of transfer on  Name of family member notified:    The following physician request were entered in Epic: Physician Request  Please sign FL2.    Additional CommentsBerton Mount,  [place]

## 2014-05-01 NOTE — Progress Notes (Signed)
Occupational Therapy Treatment Patient Details Name: Bryan Hubbard MRN: [id number] DOB: 08-21-1941 Today's Date: 05/01/2014    History of present illness This 73 y.o. 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 LE edema. Diagnosis:  CHF exacerbation; A-fib; orthostatic hypotension; NSTEMI.  He underwent cardioversion 04/09/2014 due to aflutter with RVR.  He has continued with chronic hypoxemia respiratory failure presumed due to PE, pulmonary edema and underlying parenchymal lung disease (Goodpature syndrome).  Chest CT 04/23/14 with questionable worsening Goodpasture's syndrome, but  he had normal antibody panel making Goodpastures unlikely. Underwent plasmapheresis x 2.  He has continued Hypoxemia and is currently (04/27/14) on High flow 02 and PT/OT were ordered to attempt EOB activity.     OT comments  Pt progressed to chair level today with hoyer lift required. Pt remains very fatigued and weak. Ot to continue to follow acutely and updated recommendation to SNF level of care. Pt is unable at this time to perform bed mobility alone.    Follow Up Recommendations  SNF    Equipment Recommendations   (defer SNF)    Recommendations for Other Services      Precautions / Restrictions Precautions Precautions: Fall Precaution Comments: high flow O2       Mobility Bed Mobility               General bed mobility comments: see above  Transfers                 General transfer comment: see above    Balance                                   ADL Overall ADL's : Needs assistance/impaired     Grooming: Wash/dry face;Minimal assistance;Bed level                   Toilet Transfer: Total assistance           Functional mobility during ADLs: Total assistance General ADL  Comments: pt supine on arrival and initially requesting to hold therapy. Pt agreeable to OOB to chair pending children arrival. Children will be traveling back to [place] state. pt long sitting in bed for hoyer pad to be placed behind the patient and to take medication with min (A). pt total (A) with hoyer to chair. pt remained in semi reclined position in hoyer for pain management. Pt required BIL LE support while in lift for comfort. Recliner positioned in reclined position to recieve patient. Pt once in chair total +2 total (A) for full right position. 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[employer], retail      Cognition   Behavior During Therapy: [facility] for tasks assessed/performed Overall Cognitive Status: Within Functional Limits for tasks assessed                       Extremity/Trunk Assessment               Exercises     Shoulder Instructions       General Comments      Pertinent Vitals/  Pain       Oxygen high flow continued durign session        Oxygen remained in the 90s throughout session  Home Living                                          Prior Functioning/Environment              Frequency Min 3X/week     Progress Toward Goals  OT Goals(current goals can now be found in the care plan section)  Progress towards OT goals: OT to reassess next treatment  Acute Rehab OT Goals Patient Stated Goal: To get stronger  OT Goal Formulation: With patient Time For Goal Achievement: 05/11/14 Potential to Achieve Goals: Fair ADL Goals Pt Will Perform Eating: with modified independence;sitting Pt Will Perform Grooming: with set-up;sitting Additional ADL Goal #1: Pt will be able to tolerate EOB sitting with min guard assist x  5 mins with 02 sats at least 90% Additional ADL Goal #2: Pt will be able to perform bil. UE exercises bed level, maintaining sats >90%  Plan Discharge plan needs to be updated     Co-evaluation                 End of Session Equipment Utilized During Treatment: Oxygen   Activity Tolerance Patient limited by fatigue   Patient Left in chair;with call bell/phone within reach;with family/visitor present   Nurse Communication Mobility status;Need for lift equipment;Precautions        Time: 1021-1047 OT Time Calculation (min): 26 min  Charges: OT General Charges $OT Visit: 1 Procedure OT Treatments $Self Care/Home Management : 8-22 mins $Therapeutic Activity: 8-22 mins  Peri Maris 05/01/2014, 3:32 PM Pager: [phone]

## 2014-05-01 NOTE — Progress Notes (Signed)
PULMONARY / CRITICAL CARE MEDICINE  Name: Bryan Hubbard MRN: [id number] DOB: 22-Nov-1940    ADMISSION DATE:  04/21/2014 CONSULTATION DATE:  03/31/2014  REFERRING MD :  Thereasa Solo  PRIMARY SERVICE:  TRH  CHIEF COMPLAINT:  Hypoxemic respiratory failure  BRIEF PATIENT DESCRIPTION: [name] with PMH of Goodpastures - O2 dependent, presents with worsening SOB, dizziness, weakness and LE swelling.  Cards consulted and did not feel that SOB was due to a primary cardiac reason.  PCCM was consulted for further evaluation.  SIGNIFICANT EVENTS / STUDIES:  3/27 TTE >>> PAP 45 torr 7/3   Chest CT >>> bilateral interstitial lung abnormality without nodules or consolidation, no effusion  7/7   VQ scan >>> intermediate probability of PE, new acute perfusion defect LUL since prior VQ in 12/2013  7/7   PFT >>> restrictive pattern, severe diffusion impairment, significant bronchodilator responce  7/8   Venous duplex >>> no clot  7/24 DC cardioversion 7/27 CT chest high resolution> worsening traction bronchiectasis and diffuse GGO worrisome for pneumonitis/Goodpasture syndrome 7/28 plasmapheresis x2 sessions through 7/29 7/29 Anti GBM, mpo, pr3 ab all negative 7/30 Cytoxan stopped  LINES / TUBES:  CULTURES: Urine 7/22 >> coag neg staph (likely contaminant) Blood 7/22 >> neg  ANTIBIOTICS: 7/30 bactrim >>> ???  INTERVAL HISTORY:  No overnight issues  VITAL SIGNS: Temp:  [97.5 F (36.4 C)-98 F (36.7 C)] 97.7 F (36.5 C) (08/04 0805) Pulse Rate:  [54-67] 59 (08/04 0606) Resp:  [12-22] 13 (08/04 0805) BP: (98-121)/(60-80) 116/80 mmHg (08/04 0805) SpO2:  [89 %-95 %] 95 % (08/04 0800) FiO2 (%):  [60 %] 60 % (08/04 0800) Weight:  [91.3 kg (201 lb 4.5 oz)] 91.3 kg (201 lb 4.5 oz) (08/04 0500)  HEMODYNAMICS:   VENTILATOR SETTINGS: Vent Mode:  [-]  FiO2 (%):  [60 %] 60 %  INTAKE / OUTPUT: Intake/Output     08/03 0701 - 08/04 0700 08/04 0701 - 08/05 0700   P.O. 500    I.V. (mL/kg)     IV  Piggyback     Total Intake(mL/kg) 500 (5.5)    Urine (mL/kg/hr) 2300 (1)    Total Output 2300     Net -1800           PHYSICAL EXAMINATION: General: No distress HEENT: High flow nasal cannula on PULM: Bilateral rales CV: Regular, tachycardic  AB: Soft, nontender Ex: Bilateral LE edema Neuro: Awake, alert  LABS: CBC  Recent Labs Lab 04/29/14 0750 04/30/14 0636 05/01/14 0240  WBC 8.4 7.0 8.8  HGB 8.1* 8.0* 8.4*  HCT 24.0* 23.3* 24.7*  PLT 156 133* 150   Coag's  Recent Labs Lab 04/26/14 0251 04/27/14 0311  APTT 29  --   INR 1.62* 1.21   BMET  Recent Labs Lab 04/29/14 0750  04/30/14 0220 04/30/14 0636 05/01/14 0240  NA 133*  --   --  135* 131*  K 5.3  < > 4.8 4.5 3.9  CL 87*  --   --  89* 85*  CO2 26  --   --  24 22  BUN 130*  --   --  140* 147*  CREATININE 3.06*  --   --  3.19* 3.33*  GLUCOSE 112*  --   --  127* 160*  < > = values in this interval not displayed.  Electrolytes  Recent Labs Lab 04/29/14 0750 04/30/14 0636 05/01/14 0240  CALCIUM 9.0 8.6 8.7  MG 2.7* 2.7*  --   PHOS  --  8.9* 9.1*   Sepsis Markers No results found for this basename: LATICACIDVEN, PROCALCITON, O2SATVEN,  in the last 168 hours  ABG No results found for this basename: PHART, PCO2ART, PO2ART,  in the last 168 hours  Liver Enzymes  Recent Labs Lab 04/27/14 0311 04/29/14 0750 05/01/14 0240  AST 17 28  --   ALT 10 20  --   ALKPHOS 32* 41  --   BILITOT 0.7 0.5  --   ALBUMIN 3.7 3.7 3.8   Cardiac Enzymes No results found for this basename: TROPONINI, PROBNP,  in the last 168 hours  Glucose No results found for this basename: GLUCAP,  in the last 168 hours  IMAGING: Dg Chest Port 1 View  04/30/2014   CLINICAL DATA:  Respiratory failure  EXAM: PORTABLE CHEST - 1 VIEW  COMPARISON:  04/27/2014  FINDINGS: The previously seen central venous catheter on the right has been removed in the interval. The cardiac shadow is stable. Bilateral interstitial changes are  again identified and stable. No new focal infiltrate is seen. No bony abnormality is noted.  IMPRESSION: Stable appearance of the chest following central venous catheter removal.   Electronically Signed   By: Inez Catalina M.D.   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  Doree Fudge, MD Pulmonary and [place] Pager: [phone]  05/01/2014, 9:54 AM

## 2014-05-01 NOTE — Progress Notes (Signed)
[facility] TEAM 1 - Stepdown/ICU TEAM Progress Note  Bryan Hubbard TDD:[id number] DOB: 01/26/1941 DOA: 04/04/2014 PCP: [name], MD  Admit HPI / Brief Narrative: 73 yo WM PMHx chronic atrial flutter, DVT, Goodpasture's, and recent oxygen dependent respiratory failure after hospitalization for community acquired pneumonia last month who presented to the hospital with progressive shortness of breath and lower extremity edema. In addition he was becoming very dizzy and very weak with standing. He notified his cardiologist of these symptoms several days prior to presentation and was instructed to decrease his metoprolol to 25 mg by mouth twice a day. Unfortunately his symptoms did not improve.   He presented to the hospital where it was noted that his systolic blood pressure was in the upper 80s and upon standing dropped into the 70s. Review of patient medication revealed he was taking Lasix 20 mg daily and had continued to take this medication prior to presentation. In addition he endorsed having to increase his oxygen at home from 2 L to 4 L because of progressive shortness of breath. A two-view chest x-ray demonstrated acutely worsened diffuse bilateral airspace opacification with a peripheral distribution and diffuse increased interstitial markings that could either be acute infectious or inflammatory interstitial pneumonitis.   HPI/Subjective: Alert and thinks may have some slight improvement in breathing- still with dry mouth  Assessment/Plan:  Atrial flutter with rapid ventricular response-maintaining NSR  -as per Cardiology -they will follow at a distance since has been stable  -Initially failed attempts to use cardizem, BB, or both to control RVR  -Ibutilide infusion was unsuccessful > DCCV successful under bedside anesthesia  -maintaining NSR at this time  -since had prolonged RVR we checked ECHO which demonstrated a decrease in systolic function from 62-37% to 40-45%  -Eliquis had  been held  initially due to expected issues with lower clotting factors while on plasmapheresis but also held to ensure no further melena -Platelets have recovered and no evidence of bleeding so Eliquis was resumed 8/3- but d/w pharmacist 8/4 and given worsened CrCl have opted to dc Eliquis for now in favor of IV Heparin  Goodpasture's  -chronically treated with steroid, oral cytoxan and prn plasmapheresis  -per Pulmonary chest x-ray was consistent with interstitial lung disease of uncertain etiology - due to current respiratory compromise unable to proceed with biopsy. -Patient subsequently underwent plasmapheresis catheter insertion 7/28 by IR and began plasmapheresis (see below) but serologies were negative for Goodpastures etiology so plasmapheresis dcd 7/30 and catheter discontinued 7/31 per IR  -Pulmonary symptoms ? secondary to Cytoxan toxicity; PCCM. Stopped Cytoxan  -High dose Solu-Medrol per [facility] M.  Acute on chronic respiratory failure with hypoxia:  -Patient continues to require high flow nasal cannula in order to maintain SpO2 greater than 92% while resting-no improvement in sx's -Per Dr. Roselie Awkward PCCM. 8./2 patient and wife still considering DNR-plans are to d/w them again -making no significant improvements -continue mobilization efforts  Acute systolic CHF  -See echocardiogram below   ?? Opportunistic infection  -Initially attempted to gently diurese but unfortunately only achieved worsened renal function without any improvement in respiratory status  -High resolution CT Chest 7/27 ordered by pulmonary with findings c/w Goodpastures and plasmapheresis initiated 7/28 and as of 7/29 respiratory sx's appear to stabilized  -7/30 pulmonary adjusted to very high dose steroids- pt declined bronchoscopy   -Pulmonary also concerned over cytoxan mediated fibrosis so Cytoxan was dc'd (7/30); Solu-Medrol per pulmonology  -Secondary to patient's hyperkalemia and progressive renal  failure Bactrim  DC'd by PCCM on 8/2   Glomerulonephritis /Acute kidney Injury on Chronic kidney disease stage 3, GFR 30-59 ml/min  -BUN/creatinine continues to worsen, overnight try to gently hydrate patient with normal saline without improvement.  -Nephrology reconsulted and suspect may have volume overload so high dose Lasix initiated 8/2 and thus far in a negative 4147 cc balance-unfortunately the tradeoff has been progressive worsening of his renal function  Oral candida -begin Nystatin s/s  Transient melena  -Occurred overnight into 7/30 and has not recurred so anticoagulation resumed 8/3  Hypoxemic mediated anxiety  -Klonopin has helped significantly with sx's   Abnormal UA  -cx positive only for 20,000 colonies of coag neg staph - further tx not currently indicated   Hematuria (recurrent)  -pt states this is a recurring problem but at present remains stable   Anemia of chronic disease  -Continue to hold stable - baseline between 7.9 and 9   Thrombocytopenia  - stable no overt signs of hemorrhage   HYPERLIPIDEMIA   HYPERTENSION  - 8/2: Given patient's systolic CHF, and renal failure plan was to gently hydrate at 75ml/hr but Nephrology felt volume overloaded so IVFs were dc'd  History of DVT / PE / IVC filter  -was transitioned from warfarin to eliquis during the last hospitalization at recommendation of the Pulmonologist in setting of inability to maintain therapeutic INR on warfarin. -Eliquis on hold  Hyperkalemia -Most likely caused by addition of Bactrim to treat empirically PCP; PCCM. DC'd on 8/2 -Resolving, will DC Kayexalate  Code Status: FULL Family Communication: Wife at bedside Disposition Plan: Per PCCM.  Consultants: Dr. Saralyn Pilar Wright/Dr. Roselie Awkward (PCCM.) Dr. Edrick Oh (nephrology)  Procedure/Significant Events: 7/29 echocardiogram - Left ventricle: mild LVH. LVEF= 40% to 45%.  - Mitral valve:  mild regurgitation. - Tricuspid valve:   moderate regurgitation.  Culture Urine culture coag neg staph  DVT prophylaxis: SCD + eliquis  Objective: VITAL SIGNS: Temp: 97.3 F (36.3 C) (08/04 1200) Temp src: Oral (08/04 1200) BP: 95/57 mmHg (08/04 1217) Pulse Rate: 58 (08/04 1217) SPO2; 94% on high flow nasal cannula, 40 L O2 FIO2: 65%   Intake/Output Summary (Last 24 hours) at 05/01/14 1258 Last data filed at 05/01/14 1100  Gross per 24 hour  Intake    480 ml  Output   2370 ml  Net  -1890 ml   Exam: General: No acute respiratory distress ENT: Tongue with white coating Lungs: CTA without crackles or wheezes, HFNC oxygen Cardiovascular: Regular rate and rhythm without murmur gallop or rub normal S1 and S2 Abdomen: Nontender, nondistended, soft, bowel sounds positive, no rebound, no ascites, no appreciable mass Extremities: No significant cyanosis, clubbing, or edema bilateral lower extremities  Data Reviewed: Basic Metabolic Panel:  Recent Labs Lab 04/27/14 0311 04/28/14 0218  04/29/14 0750  04/29/14 1830 04/29/14 2213 04/30/14 0220 04/30/14 0636 05/01/14 0240  NA 135* 132*  --  133*  --   --   --   --  135* 131*  K 4.8 5.7*  < > 5.3  < > 4.9 4.8 4.8 4.5 3.9  CL 93* 91*  --  87*  --   --   --   --  89* 85*  CO2 28 24  --  26  --   --   --   --  24 22  GLUCOSE 175* 202*  --  112*  --   --   --   --  127* 160*  BUN 97* 113*  --  130*  --   --   --   --  140* 147*  CREATININE 1.86* 2.39*  --  3.06*  --   --   --   --  3.19* 3.33*  CALCIUM 8.7 9.2  --  9.0  --   --   --   --  8.6 8.7  MG  --   --   --  2.7*  --   --   --   --  2.7*  --   PHOS  --   --   --   --   --   --   --   --  8.9* 9.1*  < > = values in this interval not displayed. Liver Function Tests:  Recent Labs Lab 04/27/14 0311 04/29/14 0750 05/01/14 0240  AST 17 28  --   ALT 10 20  --   ALKPHOS 32* 41  --   BILITOT 0.7 0.5  --   PROT 4.7* 5.1*  --   ALBUMIN 3.7 3.7 3.8   No results found for this basename: LIPASE, AMYLASE,  in the  last 168 hours No results found for this basename: AMMONIA,  in the last 168 hours CBC:  Recent Labs Lab 04/27/14 0311 04/28/14 0218 04/29/14 0750 04/30/14 0636 05/01/14 0240  WBC 5.0 7.9 8.4 7.0 8.8  NEUTROABS  --   --  7.8*  --   --   HGB 7.7* 8.1* 8.1* 8.0* 8.4*  HCT 22.4* 23.5* 24.0* 23.3* 24.7*  MCV 94.9 93.6 94.9 94.3 95.0  PLT 85* 112* 156 133* 150   CBG: No results found for this basename: GLUCAP,  in the last 168 hours  No results found for this or any previous visit (from the past 240 hour(s)).   Studies:  Recent x-ray studies have been reviewed in detail by the Attending Physician  Scheduled Meds:  Scheduled Meds: . antiseptic oral rinse  15 mL Mouth Rinse BID  . calcium acetate  667 mg Oral TID WC  . clonazePAM  0.25 mg Oral BID  . [START ON 05/10/2014] cyanocobalamin  1,000 mcg Subcutaneous Q30 days  . cyclobenzaprine  5 mg Oral QHS  . darbepoetin (ARANESP) injection - NON-DIALYSIS  60 mcg Subcutaneous Q Wed-1800  . tamsulosin  0.4 mg Oral Daily   And  . dutasteride  0.5 mg Oral Daily  . feeding supplement (NEPRO CARB STEADY)  237 mL Oral BID BM  . furosemide  100 mg Intravenous 3 times per day  . latanoprost  1 drop Both Eyes q morning - 10a  . levalbuterol  1.25 mg Nebulization 4 times per day  . methylPREDNISolone (SOLU-MEDROL) injection  60 mg Intravenous Q12H  . metolazone  5 mg Oral Q24H  . metoprolol tartrate  25 mg Oral 4 times per day  . montelukast  10 mg Oral QHS  . nystatin  5 mL Oral QID  . pantoprazole  40 mg Oral Q1200    Time spent on care of this patient: 35 mins   ELLIS,ALLISON L. , ANP   [facility] Office  [phone] Pager - [phone]  On-Call/Text Page:      [email or web address]      password [email or web address]  If 7PM-7AM, please contact night-coverage [email or web address] Password [email or web address] 05/01/2014, 12:58 PM   LOS: 14 days   Examining patient and discussed assessment and plan with ANP Ebony Hail and agree with the above. Patient with  multiple complex medical problems per patient care>35 min

## 2014-05-02 DIAGNOSIS — E43 Unspecified severe protein-calorie malnutrition: Secondary | ICD-10-CM | POA: Insufficient documentation

## 2014-05-02 LAB — HEPARIN LEVEL (UNFRACTIONATED): Heparin Unfractionated: >2.2 IU/mL — ABNORMAL HIGH (ref 0.30–0.70)

## 2014-05-02 LAB — RENAL FUNCTION PANEL
ALBUMIN: 3.7 g/dL (ref 3.5–5.2)
Anion gap: 24 — ABNORMAL HIGH (ref 5–15)
BUN: 149 mg/dL — AB (ref 6–23)
CO2: 24 mEq/L (ref 19–32)
CREATININE: 3.19 mg/dL — AB (ref 0.50–1.35)
Calcium: 8.9 mg/dL (ref 8.4–10.5)
Chloride: 87 mEq/L — ABNORMAL LOW (ref 96–112)
GFR calc Af Amer: 21 mL/min — ABNORMAL LOW (ref >90)
GFR, EST NON AFRICAN AMERICAN: 18 mL/min — AB (ref >90)
Glucose, Bld: 174 mg/dL — ABNORMAL HIGH (ref 70–99)
PHOSPHORUS: 8.5 mg/dL — AB (ref 2.3–4.6)
Potassium: 3.6 mEq/L — ABNORMAL LOW (ref 3.7–5.3)
Sodium: 135 mEq/L — ABNORMAL LOW (ref 137–147)

## 2014-05-02 LAB — CBC
HEMATOCRIT: 27.6 % — AB (ref 39.0–52.0)
Hemoglobin: 9.4 g/dL — ABNORMAL LOW (ref 13.0–17.0)
MCH: 32.9 pg (ref 26.0–34.0)
MCHC: 34.1 g/dL (ref 30.0–36.0)
MCV: 96.5 fL (ref 78.0–100.0)
Platelets: 172 10*3/uL (ref 150–400)
RBC: 2.86 MIL/uL — ABNORMAL LOW (ref 4.22–5.81)
RDW: 20.8 % — ABNORMAL HIGH (ref 11.5–15.5)
WBC: 9.3 10*3/uL (ref 4.0–10.5)

## 2014-05-02 LAB — APTT
APTT: 81 s — AB (ref 24–37)
aPTT: 55 seconds — ABNORMAL HIGH (ref 24–37)

## 2014-05-02 MED ORDER — LEVALBUTEROL HCL 1.25 MG/0.5ML IN NEBU
1.2500 mg | INHALATION_SOLUTION | Freq: Three times a day (TID) | RESPIRATORY_TRACT | Status: DC
  Administered 2014-05-03 – 2014-05-07 (×13): 1.25 mg via RESPIRATORY_TRACT
  Filled 2014-05-02 (×16): qty 0.5

## 2014-05-02 MED ORDER — POTASSIUM CHLORIDE CRYS ER 20 MEQ PO TBCR
20.0000 meq | EXTENDED_RELEASE_TABLET | Freq: Once | ORAL | Status: AC
  Administered 2014-05-02: 20 meq via ORAL
  Filled 2014-05-02: qty 1

## 2014-05-02 MED ORDER — METHYLPREDNISOLONE SODIUM SUCC 125 MG IJ SOLR
60.0000 mg | Freq: Every day | INTRAMUSCULAR | Status: DC
  Administered 2014-05-03: 60 mg via INTRAVENOUS
  Filled 2014-05-02 (×2): qty 0.96

## 2014-05-02 MED ORDER — FUROSEMIDE 10 MG/ML IJ SOLN
100.0000 mg | Freq: Two times a day (BID) | INTRAVENOUS | Status: DC
  Administered 2014-05-02: 100 mg via INTRAVENOUS
  Filled 2014-05-02 (×3): qty 10

## 2014-05-02 NOTE — Progress Notes (Signed)
Physical Therapy Treatment Patient Details Name: Bryan Hubbard MRN: [id number] DOB: [birth year] Today's Date: 05/02/2014    History of Present Illness This 73 y.o. 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 LE edema. Diagnosis:  CHF exacerbation; A-fib; orthostatic hypotension; NSTEMI.  He underwent cardioversion 04/04/2014 due to aflutter with RVR.  He has continued with chronic hypoxemia respiratory failure presumed due to PE, pulmonary edema and underlying parenchymal lung disease (Goodpature syndrome).  Chest CT 04/23/14 with questionable worsening Goodpasture's syndrome, but  he had normal antibody panel making Goodpastures unlikely. Underwent plasmapheresis x 2.  He has continued Hypoxemia and is currently (04/27/14) on High flow 02 and PT/OT were ordered to attempt EOB activity.      PT Comments    Pt tolerated bed exercises today as well as  Sitting EOB and lift to chair with maxi move. Pt very fatigued after session but pleased to be out of bed. Pt and wife educated on LE exercises that they can perform together. PT will continue to follow.    Follow Up Recommendations  SNF     Equipment Recommendations  None recommended by PT    Recommendations for Other Services       Precautions / Restrictions Precautions Precautions: Fall Precaution Comments: high flow O2 Restrictions Weight Bearing Restrictions: No    Mobility  Bed Mobility Overal bed mobility: +2 for physical assistance;Needs Assistance Bed Mobility: Rolling;Sidelying to Sit;Sit to Supine Rolling: Mod assist Sidelying to sit: Mod assist;+2 for safety/equipment   Sit to supine: +2 for physical assistance;Mod assist   General bed mobility comments: pt participated in rolling to both sides, mod A to bridge bilateral knees, pt able to reach  across self to pull on rail for rolling. Mod A for SL to sit with +2 behind pt for safety as well as mgmt of lines. +2 mod A for back to supine with assist to return legs into bed and to lower truk down to bed  Transfers Overall transfer level: Needs assistance Equipment used:  (maxi move)             General transfer comment: maxi move used for bed to chair, pt fatigued from being in sling for 10 mins while multiple lines managed but tolerated transfer well. Bilateral legs supported throughout with knees minimally flexed.  Ambulation/Gait             General Gait Details: NT   Stairs            Wheelchair Mobility    Modified Rankin (Stroke Patients Only)       Balance Overall balance assessment: Needs assistance Sitting-balance support: Bilateral upper extremity supported;Feet unsupported Sitting balance-Leahy Scale: Poor Sitting balance - Comments: pt tolerated sitting EOB x7 mins with min A at the beginning progressing to mod A as pt fatigued. Attempted LE exercises in sitting but pt with insufficient energy. Postural control: Right lateral lean;Posterior lean                          Cognition Arousal/Alertness: Awake/alert Behavior During Therapy: WFL for tasks assessed/performed Overall Cognitive Status: Within Functional Limits for tasks assessed                      Exercises Total Joint Exercises Bridges: AROM;5  reps;Supine (initiation) General Exercises - Lower Extremity Ankle Circles/Pumps: AROM;Both;15 reps;Supine Quad Sets: AROM;Both;10 reps;Supine Gluteal Sets: AROM;Both;10 reps;Supine Long Arc Quad: AROM;Both;Seated (2x each) Heel Slides: AAROM;Both;Supine;10 reps Straight Leg Raises: AAROM;Both;10 reps;Supine    General Comments General comments (skin integrity, edema, etc.): O2 sats decreased to 78% in sitting      Pertinent Vitals/Pain See general comments Bilateral hip pain after transfer to chair, faces 8/10,  RN notified    Home Living                      Prior Function            PT Goals (current goals can now be found in the care plan section) Acute Rehab PT Goals Patient Stated Goal: To get stronger  PT Goal Formulation: With patient Time For Goal Achievement: 05/11/14 Potential to Achieve Goals: Fair Progress towards PT goals: Progressing toward goals    Frequency  Min 2X/week    PT Plan Current plan remains appropriate    Co-evaluation             End of Session Equipment Utilized During Treatment: Oxygen Activity Tolerance: Patient limited by fatigue Patient left: in chair;with call bell/phone within reach;with family/visitor present     Time: 5809-9833 PT Time Calculation (min): 57 min  Charges:  $Therapeutic Exercise: 8-22 mins $Therapeutic Activity: 38-52 mins                    G Codes:     Bryan Hubbard, PT  Acute Rehab Services  [phone] Bryan Hubbard 05/02/2014, 11:35 AM

## 2014-05-02 NOTE — Progress Notes (Signed)
INITIAL NUTRITION ASSESSMENT  DOCUMENTATION CODES Per approved criteria  -Severe malnutrition in the context of chronic illness   INTERVENTION: Continue Nepro Shake po BID, each supplement provides 425 kcal and 19 grams protein  NUTRITION DIAGNOSIS: Malnutrition related to chronic illness as evidenced by severe fat and muscle depletion.   Goal: Pt to meet >/= 90% of their estimated nutrition needs   Monitor:  PO intake, supplement acceptance, weight trend, labs  Reason for Assessment: Pt identified as at nutrition risk on the Malnutrition Screen Tool  73 y.o. male  Admitting Dx: Hypoxemic respiratory failure  ASSESSMENT: Pt with hx of Goodpasture's who is O2 dependent, s/p plasmapheresis x 2 and remains on high dose steroids. Pt with PE and s/p IVC filter.   Hospital day # 15. Per pt appetite is so/so. Pt has been consuming <50% of his meals. Pt does like the Nepro shakes ordered, encouraged pt to consume these.   Nutrition Focused Physical Exam:  Subcutaneous Fat:  Orbital Region: WDL Upper Arm Region: severe depletion  Thoracic and Lumbar Region: mild/moderate depletion   Muscle:  Temple Region: WDL Clavicle Bone Region: severe depletion  Clavicle and Acromion Bone Region: severe depletion  Scapular Bone Region: severe depletion Dorsal Hand: severe depeltion Patellar Region: WDL Anterior Thigh Region: WDL Posterior Calf Region: WDL  Edema:  +1 - +2  Labs:  Sodium low Potassium low BUN/Cr elevated Phosphorus elevated at 8.5  Height: Ht Readings from Last 1 Encounters:  04/22/14 5\' 10"  (1.778 m)    Weight: Wt Readings from Last 1 Encounters:  05/02/14 200 lb 2.8 oz (90.8 kg)  Admission weight: 175 lb (79.5 kg) 7/21  Ideal Body Weight: 75.4 kg   % Ideal Body Weight: 105%  Wt Readings from Last 10 Encounters:  05/02/14 200 lb 2.8 oz (90.8 kg)  05/02/14 200 lb 2.8 oz (90.8 kg)  05/02/14 200 lb 2.8 oz (90.8 kg)  05/02/14 200 lb 2.8 oz (90.8 kg)   04/10/14 175 lb (79.379 kg)  04/06/14 175 lb 12.8 oz (79.742 kg)  03/30/14 176 lb (79.833 kg)  03/23/14 176 lb 4 oz (79.946 kg)  03/18/14 178 lb 8 oz (80.967 kg)  03/05/14 182 lb 1.9 oz (82.609 kg)    Usual Body Weight: 175 lb   % Usual Body Weight: 100%  BMI:  Body mass index is 28.72 kg/(m^2).  Estimated Nutritional Needs: Kcal: 2000-2200 Protein: 95-110 grams Fluid: >1.5 L/day  Skin: ecchymosis  Diet Order: General Meal Completion: 25-60%  EDUCATION NEEDS: -No education needs identified at this time   Intake/Output Summary (Last 24 hours) at 05/02/14 1149 Last data filed at 05/02/14 0600  Gross per 24 hour  Intake 1155.33 ml  Output   2811 ml  Net -1655.67 ml    Last BM: 8/4   Labs:   Recent Labs Lab 04/29/14 0750  04/30/14 0636 05/01/14 0240 05/02/14 0443  NA 133*  --  135* 131* 135*  K 5.3  < > 4.5 3.9 3.6*  CL 87*  --  89* 85* 87*  CO2 26  --  24 22 24   BUN 130*  --  140* 147* 149*  CREATININE 3.06*  --  3.19* 3.33* 3.19*  CALCIUM 9.0  --  8.6 8.7 8.9  MG 2.7*  --  2.7*  --   --   PHOS  --   --  8.9* 9.1* 8.5*  GLUCOSE 112*  --  127* 160* 174*  < > = values in this interval not  displayed.  CBG (last 3)  No results found for this basename: GLUCAP,  in the last 72 hours  Scheduled Meds: . antiseptic oral rinse  15 mL Mouth Rinse BID  . calcium acetate  667 mg Oral TID WC  . clonazePAM  0.25 mg Oral BID  . [START ON 05/10/2014] cyanocobalamin  1,000 mcg Subcutaneous Q30 days  . cyclobenzaprine  5 mg Oral QHS  . darbepoetin (ARANESP) injection - NON-DIALYSIS  60 mcg Subcutaneous Q Wed-1800  . tamsulosin  0.4 mg Oral Daily   And  . dutasteride  0.5 mg Oral Daily  . feeding supplement (NEPRO CARB STEADY)  237 mL Oral BID BM  . furosemide  100 mg Intravenous BID  . latanoprost  1 drop Both Eyes q morning - 10a  . levalbuterol  1.25 mg Nebulization 4 times per day  . [START ON 05/03/2014] methylPREDNISolone (SOLU-MEDROL) injection  60 mg  Intravenous Daily  . metolazone  5 mg Oral Q24H  . metoprolol tartrate  25 mg Oral 4 times per day  . montelukast  10 mg Oral QHS  . nystatin  5 mL Oral QID  . pantoprazole  40 mg Oral Q1200    Continuous Infusions: . heparin 1,150 Units/hr (05/02/14 0998)    Past Medical History  Diagnosis Date  . Atrial flutter started in 2011  . Chronic diastolic heart failure   . DVT   . HYPERLIPIDEMIA   . HYPERTENSION, BENIGN   . ALLERGIC ASTHMA   . Palpitations   . H/O Goodpasture's syndrome     "autoimmune disease; affects kidneys and lungs"  . DVT (deep venous thrombosis) 09/2001    "right groin"  . PE (pulmonary embolism) 09/2001  . CHF (congestive heart failure)   . Anemia   . History of blood transfusion     "related to Goodpasture's syndrome; plasmapheresis process"  . Stomach ulcer   . Arthritis     "hands, knees" (03/16/2014)  . Gout   . Renal insufficiency     "related to the Goodpasture's"  . Atrial flutter with rapid ventricular response 03/16/2014  . Acute on chronic diastolic HF (heart failure) in combination with rapid a flutter and anemia 03/18/2014  . Ejection fraction     Past Surgical History  Procedure Laterality Date  . Renal biopsy, percutaneous  12/2013  . Vena cava filter placement  09/2001  . Cardioversion N/A 04/22/2014    Procedure: CARDIOVERSION;  Surgeon: Candee Furbish, MD;  Location: [facility];  Service: Cardiovascular;  Laterality: N/A;    Maylon Peppers RD, [place], [place] Pager [phone] After Hours Pager

## 2014-05-02 NOTE — Progress Notes (Signed)
ANTICOAGULATION CONSULT NOTE - Follow Up Consult  Pharmacy Consult for Heparin  Indication: atrial fibrillation  No Known Allergies  Patient Measurements: Height: 5\' 10"  (177.8 cm) Weight: 200 lb 2.8 oz (90.8 kg) IBW/kg (Calculated) : 73  Vital Signs: Temp: 98.6 F (37 C) (08/05 1151) Temp src: Oral (08/05 1151) BP: 93/58 mmHg (08/05 1151) Pulse Rate: 93 (08/05 0556)  Labs:  Recent Labs  04/30/14 0636 05/01/14 0240 05/01/14 1345 05/01/14 1555 05/02/14 0443 05/02/14 1340  HGB 8.0* 8.4*  --   --  9.4*  --   HCT 23.3* 24.7*  --   --  27.6*  --   PLT 133* 150  --   --  172  --   APTT  --   --  25  --  55* 81*  HEPARINUNFRC  --   --  >2.20* >2.20* >2.20*  --   CREATININE 3.19* 3.33*  --   --  3.19*  --     Estimated Creatinine Clearance: 23 ml/min (by C-G formula based on Cr of 3.19).  Medications:  Heparin 1000 units/hr  Assessment: 73 y/o M on heparin while apixaban on hold in setting od acute on chronic renal failure. Heparin level remains > 2.2 and aptt is 81 and at goal.  Goal of Therapy:  Heparin level 0.3-0.7 units/ml aPTT 66-102 seconds Monitor platelets by anticoagulation protocol: Yes   Plan:  -No heparin changes needed -Daily heparin level and aPTT  Hildred Laser, Pharm D 05/02/2014 2:37 PM

## 2014-05-02 NOTE — Progress Notes (Signed)
PULMONARY / CRITICAL CARE MEDICINE  Name: Bryan Hubbard MRN: [id number] DOB: Jun 08, 1941    ADMISSION DATE:  04/20/2014 CONSULTATION DATE:  03/31/2014  REFERRING MD :  Thereasa Solo  PRIMARY SERVICE:  TRH  CHIEF COMPLAINT:  Hypoxemic respiratory failure  BRIEF PATIENT DESCRIPTION: [name] with PMH of Goodpastures - O2 dependent, presents with worsening SOB, dizziness, weakness and LE swelling.  Cards consulted and did not feel that SOB was due to a primary cardiac reason.  PCCM was consulted for further evaluation.  SIGNIFICANT EVENTS / STUDIES:  3/27 TTE >>> PAP 45 torr 7/3   Chest CT >>> bilateral interstitial lung abnormality without nodules or consolidation, no effusion  7/7   VQ scan >>> intermediate probability of PE, new acute perfusion defect LUL since prior VQ in 12/2013  7/7   PFT >>> restrictive pattern, severe diffusion impairment, significant bronchodilator responce  7/8   Venous duplex >>> no clot  7/24 DC cardioversion 7/27 CT chest high resolution> worsening traction bronchiectasis and diffuse GGO worrisome for pneumonitis/Goodpasture syndrome 7/28 plasmapheresis x2 sessions through 7/29 7/29 Anti GBM, mpo, pr3 ab all negative 7/30 Cytoxan stopped 8/4 Code status changed to DNR / DNI.  LINES / TUBES:  CULTURES: Urine 7/22 >> coag neg staph (likely contaminant) Blood 7/22 >> neg  ANTIBIOTICS: Bactrim7/30 >>> 8/1   INTERVAL HISTORY:  No acute events overnight.  Just worked with PT/OT who got him out of bed and into recliner.  Now fatigued after therapy.  SOB persists, unchanged.  VITAL SIGNS: Temp:  [97.2 F (36.2 C)-98.5 F (36.9 C)] 97.2 F (36.2 C) (08/05 0817) Pulse Rate:  [58-110] 93 (08/05 0556) Resp:  [15-31] 17 (08/05 0817) BP: (95-116)/(57-79) 114/79 mmHg (08/05 0817) SpO2:  [87 %-96 %] 90 % (08/05 0842) FiO2 (%):  [60 %-62.7 %] 60 % (08/05 0842) Weight:  [90.8 kg (200 lb 2.8 oz)] 90.8 kg (200 lb 2.8 oz) (08/05 0500)  HEMODYNAMICS:   VENTILATOR  SETTINGS: Vent Mode:  [-]  FiO2 (%):  [60 %-62.7 %] 60 %  INTAKE / OUTPUT: Intake/Output     08/04 0701 - 08/05 0700 08/05 0701 - 08/06 0700   P.O. 700    I.V. (mL/kg) 75.3 (0.8)    IV Piggyback 480    Total Intake(mL/kg) 1255.3 (13.8)    Urine (mL/kg/hr) 3230 (1.5)    Stool 1 (0)    Total Output 3231     Net -1975.7           PHYSICAL EXAMINATION: General: Fatigued after working with PT but in NAD, in recliner with PT assist. HEENT: High flow nasal cannula in place. PULM: Slight rhonchi bilaterally, otherwise clear. CV: IRIR, no M/R/G. AB: Soft, nontender. Ex: Bilateral LE edema. Neuro: Awake, alert.  LABS: CBC  Recent Labs Lab 04/30/14 0636 05/01/14 0240 05/02/14 0443  WBC 7.0 8.8 9.3  HGB 8.0* 8.4* 9.4*  HCT 23.3* 24.7* 27.6*  PLT 133* 150 172   Coag's  Recent Labs Lab 04/26/14 0251 04/27/14 0311 05/01/14 1345 05/02/14 0443  APTT 29  --  25 55*  INR 1.62* 1.21  --   --    BMET  Recent Labs Lab 04/30/14 0636 05/01/14 0240 05/02/14 0443  NA 135* 131* 135*  K 4.5 3.9 3.6*  CL 89* 85* 87*  CO2 24 22 24   BUN 140* 147* 149*  CREATININE 3.19* 3.33* 3.19*  GLUCOSE 127* 160* 174*    Electrolytes  Recent Labs Lab 04/29/14 0750 04/30/14 0636 05/01/14  0240 05/02/14 0443  CALCIUM 9.0 8.6 8.7 8.9  MG 2.7* 2.7*  --   --   PHOS  --  8.9* 9.1* 8.5*   Sepsis Markers No results found for this basename: LATICACIDVEN, PROCALCITON, O2SATVEN,  in the last 168 hours  ABG No results found for this basename: PHART, PCO2ART, PO2ART,  in the last 168 hours  Liver Enzymes  Recent Labs Lab 04/27/14 0311 04/29/14 0750 05/01/14 0240 05/02/14 0443  AST 17 28  --   --   ALT 10 20  --   --   ALKPHOS 32* 41  --   --   BILITOT 0.7 0.5  --   --   ALBUMIN 3.7 3.7 3.8 3.7   Cardiac Enzymes No results found for this basename: TROPONINI, PROBNP,  in the last 168 hours  Glucose No results found for this basename: GLUCAP,  in the last 168  hours  IMAGING: No results found.  ASSESSMENT / PLAN:  Acute hypoxemic respiratory failure - Continue high flow O2 for goal SpO2 > 92%. - Continue PT / OT as able. - DNR / DNI after discussion between Dr. Lake Bells, pt, and pt's wife 05/01/14.  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 Reactive airway disease - Continue Solu-Medrol 60 mg IV q12h, Xopenex, Singulair.  Pulmonary edema - Renal following. - Continue Lasix, Metolazone.   PE, s/p IVC filter - Eliquis d/c'd by pharmacy for worsening renal fx. - Continue Heparin.    AF rate controlled - Cardiology following. - Continue Lopressor.   Montey Hora, [facility] Pgr: [phone]  or [phone]  Attending:  I have seen and examined the patient with nurse practitioner/resident and agree with the note above.   I will wean solumedrol Now DNR, but still hopeful for good outcome.  Suspect he will need weeks of hospitalization. If kidney function stabilizes would make a good LTAC candidate.  Roselie Awkward, MD [facility] Pager: [phone] Cell: [phone] If no response, call [phone]

## 2014-05-02 NOTE — Progress Notes (Signed)
Pt converted to Afib, HR 90-110, asymptomatic. BP 110/6's, scheduled metoprolol given early. Dr Sherral Hammers notified.

## 2014-05-02 NOTE — Progress Notes (Signed)
[facility] TEAM 1 - Stepdown/ICU TEAM Progress Note  Bryan Hubbard XTG:[id number] DOB: [birth year] DOA: 04/03/2014 PCP: [name], MD  Admit HPI / Brief Narrative: 73 yo M w/ Hx chronic atrial flutter, DVT, Goodpasture's, and recent oxygen dependent respiratory failure after hospitalization for community acquired pneumonia last month who presented to the hospital with progressive shortness of breath and lower extremity edema. In addition he was becoming very dizzy and very weak with standing. He notified his cardiologist of these symptoms several days prior to presentation and was instructed to decrease his metoprolol to 25 mg by mouth twice a day. Unfortunately his symptoms did not improve.   He presented to the hospital where it was noted that his systolic blood pressure was in the upper 80s and upon standing dropped into the 70s. Review of patient medication revealed he was taking Lasix 20 mg daily and had continued to take this medication prior to presentation. In addition he endorsed having to increase his oxygen at home from 2 L to 4 L because of progressive shortness of breath. A two-view chest x-ray demonstrated acutely worsened diffuse bilateral airspace opacification with a peripheral distribution and diffuse increased interstitial markings that could either be acute infectious or inflammatory interstitial pneumonitis.   HPI/Subjective: Alert and denies worsened respiratory symptoms.  Assessment/Plan:  Atrial flutter with rapid ventricular response-maintaining NSR  -as per Cardiology - they will follow at a distance since has been stable  -Initially failed attempts to use cardizem, BB, or both to control RVR  -Ibutilide infusion was unsuccessful > DCCV successful under bedside anesthesia  -maintaining NSR at this time  -since had prolonged RVR we checked ECHO which demonstrated a decrease in systolic function from 27-03% to 40-45%  -Eliquis had been held  initially due to expected  issues with lower clotting factors while on plasmapheresis but also held to ensure no further melena -given worsened CrCl have opted to dc Eliquis for now in favor of IV Heparin  Goodpasture's  -chronically treated with steroid, oral cytoxan and prn plasmapheresis  -per Pulmonary chest x-ray was consistent with interstitial lung disease of uncertain etiology - due to current respiratory compromise unable to proceed with biopsy. -Patient subsequently underwent plasmapheresis catheter insertion 7/28 by IR and began plasmapheresis (see below) but serologies were negative for Goodpastures etiology so plasmapheresis dcd 7/30 and catheter discontinued 7/31 per IR  -Pulmonary symptoms ? secondary to Cytoxan toxicity; PCCM. Stopped Cytoxan  -High dose Solu-Medrol per PCCM  Acute on chronic respiratory failure with hypoxia:  -Patient continues to require high flow nasal cannula in order to maintain SpO2 greater than 92% while resting-no improvement in sx's Dr. [place] Bells d/w pt and wife the role of mechanical ventilation and that if he further deteriorates that he would NOT benefit from mechanical ventilation and in fact mechanical ventilation would likely hurt hime- pt now DNR but still pursuing aggressive care -continue mobilization efforts -Pulmonary expects prolonged hospitalization and once more stable from a renal standpoint should consider LTAC  Acute systolic CHF  -See echocardiogram below   ?? Opportunistic infection  -Initially attempted to gently diurese but unfortunately only achieved worsened renal function without any improvement in respiratory status  -High resolution CT Chest 7/27 ordered by pulmonary with findings c/w Goodpastures and plasmapheresis initiated 7/28 and as of 7/29 respiratory sx's appear to stabilized  -7/30 pulmonary adjusted to very high dose steroids- pt declined bronchoscopy   -Pulmonary also concerned over cytoxan mediated fibrosis so Cytoxan was dc'd (7/30);  Solu-Medrol per  pulmonology  -Bactrim DC'd by PCCM on 8/2   Glomerulonephritis /Acute kidney Injury on Chronic kidney disease stage 3, GFR 30-59 ml/min  -Nephrology reconsulted and suspected may have volume overload so high dose Lasix initiated 8/2 and thus far in a negative 6284 cc balance-unfortunately the tradeoff has been progressive worsening of his renal function  Oral candida -began Nystatin s/s 8/4  Transient melena  -Occurred overnight into 7/30 and has not recurred so anticoagulation resumed 8/3  Hypoxemic mediated anxiety  -Klonopin has helped significantly with sx's   Abnormal UA  -cx positive only for 20,000 colonies of coag neg staph - further tx not currently indicated   Hematuria (recurrent)  -pt states this is a recurring problem but at present remains stable  Anemia of chronic disease  -Continue to hold stable - baseline between 7.9 and 9   Thrombocytopenia  - resolved  HYPERLIPIDEMIA   HYPERTENSION  -BP controlled  History of DVT / PE / IVC filter  -was transitioned from warfarin to eliquis during the last hospitalization at recommendation of the Pulmonologist in setting of inability to maintain therapeutic INR on warfarin. -Eliquis on hold  Hyperkalemia -Most likely caused by addition of Bactrim to treat empirically PCP; PCCM. DC'd on 8/2 -Resolving, will DC Kayexalate  Code Status: FULL Family Communication: Wife at bedside Disposition Plan: SDU until renal fnx stable then consider LTAC  Consultants: Dr. Saralyn Pilar Wright/Dr. Roselie Awkward (PCCM.) Dr. Edrick Oh (nephrology)  Procedure/Significant Events: 7/29 echocardiogram - Left ventricle: mild LVH. LVEF= 40% to 45%.  - Mitral valve:  mild regurgitation. - Tricuspid valve:  moderate regurgitation.  Culture Urine culture coag neg staph  DVT prophylaxis: heparin  Objective: VITAL SIGNS: Temp: 98.6 F (37 C) (08/05 1151) Temp src: Oral (08/05 1151) BP: 93/58 mmHg (08/05 1151) Pulse  Rate: 93 (08/05 0556) SPO2; 94% on high flow nasal cannula, 40 L O2 FIO2: 65%   Intake/Output Summary (Last 24 hours) at 05/02/14 1311 Last data filed at 05/02/14 1200  Gross per 24 hour  Intake 1124.06 ml  Output   3461 ml  Net -2336.94 ml   Exam: General: No acute respiratory distress Lungs: CTA without crackles or wheezes, HFNC oxygen Cardiovascular: Regular rate and rhythm without murmur gallop or rub normal S1 and S2-peripheral edema below knees 1+ bilaterally Abdomen: Nontender, nondistended, soft, bowel sounds positive, no rebound, no ascites, no appreciable mass Extremities: No significant cyanosis, clubbing, or edema bilateral lower extremities  Data Reviewed: Basic Metabolic Panel:  Recent Labs Lab 04/28/14 0218  04/29/14 0750  04/29/14 2213 04/30/14 0220 04/30/14 0636 05/01/14 0240 05/02/14 0443  NA 132*  --  133*  --   --   --  135* 131* 135*  K 5.7*  < > 5.3  < > 4.8 4.8 4.5 3.9 3.6*  CL 91*  --  87*  --   --   --  89* 85* 87*  CO2 24  --  26  --   --   --  24 22 24   GLUCOSE 202*  --  112*  --   --   --  127* 160* 174*  BUN 113*  --  130*  --   --   --  140* 147* 149*  CREATININE 2.39*  --  3.06*  --   --   --  3.19* 3.33* 3.19*  CALCIUM 9.2  --  9.0  --   --   --  8.6 8.7 8.9  MG  --   --  2.7*  --   --   --  2.7*  --   --   PHOS  --   --   --   --   --   --  8.9* 9.1* 8.5*  < > = values in this interval not displayed.  Liver Function Tests:  Recent Labs Lab 04/27/14 0311 04/29/14 0750 05/01/14 0240 05/02/14 0443  AST 17 28  --   --   ALT 10 20  --   --   ALKPHOS 32* 41  --   --   BILITOT 0.7 0.5  --   --   PROT 4.7* 5.1*  --   --   ALBUMIN 3.7 3.7 3.8 3.7   CBC:  Recent Labs Lab 04/28/14 0218 04/29/14 0750 04/30/14 0636 05/01/14 0240 05/02/14 0443  WBC 7.9 8.4 7.0 8.8 9.3  NEUTROABS  --  7.8*  --   --   --   HGB 8.1* 8.1* 8.0* 8.4* 9.4*  HCT 23.5* 24.0* 23.3* 24.7* 27.6*  MCV 93.6 94.9 94.3 95.0 96.5  PLT 112* 156 133* 150 172     Studies:  Recent x-ray studies have been reviewed in detail by the Attending Physician  Scheduled Meds:  Scheduled Meds: . antiseptic oral rinse  15 mL Mouth Rinse BID  . calcium acetate  667 mg Oral TID WC  . clonazePAM  0.25 mg Oral BID  . [START ON 05/10/2014] cyanocobalamin  1,000 mcg Subcutaneous Q30 days  . cyclobenzaprine  5 mg Oral QHS  . darbepoetin (ARANESP) injection - NON-DIALYSIS  60 mcg Subcutaneous Q Wed-1800  . tamsulosin  0.4 mg Oral Daily   And  . dutasteride  0.5 mg Oral Daily  . feeding supplement (NEPRO CARB STEADY)  237 mL Oral BID BM  . furosemide  100 mg Intravenous BID  . latanoprost  1 drop Both Eyes q morning - 10a  . levalbuterol  1.25 mg Nebulization 4 times per day  . [START ON 05/03/2014] methylPREDNISolone (SOLU-MEDROL) injection  60 mg Intravenous Daily  . metolazone  5 mg Oral Q24H  . metoprolol tartrate  25 mg Oral 4 times per day  . montelukast  10 mg Oral QHS  . nystatin  5 mL Oral QID  . pantoprazole  40 mg Oral Q1200    Time spent on care of this patient: 35 mins   ELLIS,ALLISON L. , ANP   [facility] Office  [phone] Pager - [phone]  On-Call/Text Page:      [email or web address]      password [email or web address]  If 7PM-7AM, please contact night-coverage [email or web address] Password [email or web address] 05/02/2014, 1:11 PM   LOS: 15 days   I have personally examined this patient and reviewed the entire database. I have reviewed the above note, made any necessary editorial changes, and agree with its content.  Cherene Altes, MD [facility]

## 2014-05-02 NOTE — Progress Notes (Signed)
S: NO N/V   Eating breakfast O:BP 114/79  Pulse 93  Temp(Src) 97.2 F (36.2 C) (Oral)  Resp 17  Ht 5\' 10"  (1.778 m)  Wt 90.8 kg (200 lb 2.8 oz)  BMI 28.72 kg/m2  SpO2 90%  Intake/Output Summary (Last 24 hours) at 05/02/14 0854 Last data filed at 05/02/14 0600  Gross per 24 hour  Intake 1255.33 ml  Output   3231 ml  Net -1975.67 ml   Weight change: -0.5 kg (-1 lb 1.6 oz) HKV:QQVZD and alert CVS:RRR Resp: Bil crackles but good air movement Abd:+ BS NTND Ext: 2+ edema NEURO:CNI Ox3 no asterixis   . antiseptic oral rinse  15 mL Mouth Rinse BID  . calcium acetate  667 mg Oral TID WC  . clonazePAM  0.25 mg Oral BID  . [START ON 05/10/2014] cyanocobalamin  1,000 mcg Subcutaneous Q30 days  . cyclobenzaprine  5 mg Oral QHS  . darbepoetin (ARANESP) injection - NON-DIALYSIS  60 mcg Subcutaneous Q Wed-1800  . tamsulosin  0.4 mg Oral Daily   And  . dutasteride  0.5 mg Oral Daily  . feeding supplement (NEPRO CARB STEADY)  237 mL Oral BID BM  . furosemide  100 mg Intravenous 3 times per day  . latanoprost  1 drop Both Eyes q morning - 10a  . levalbuterol  1.25 mg Nebulization 4 times per day  . methylPREDNISolone (SOLU-MEDROL) injection  60 mg Intravenous Q12H  . metolazone  5 mg Oral Q24H  . metoprolol tartrate  25 mg Oral 4 times per day  . montelukast  10 mg Oral QHS  . nystatin  5 mL Oral QID  . pantoprazole  40 mg Oral Q1200   No results found. BMET    Component Value Date/Time   NA 135* 05/02/2014 0443   K 3.6* 05/02/2014 0443   CL 87* 05/02/2014 0443   CO2 24 05/02/2014 0443   GLUCOSE 174* 05/02/2014 0443   BUN 149* 05/02/2014 0443   CREATININE 3.19* 05/02/2014 0443   CALCIUM 8.9 05/02/2014 0443   GFRNONAA 18* 05/02/2014 0443   GFRAA 21* 05/02/2014 0443   CBC    Component Value Date/Time   WBC 9.3 05/02/2014 0443   WBC 10.4* 12/29/2011 1003   RBC 2.86* 05/02/2014 0443   RBC 2.70* 01/20/2014 0913   RBC 4.05* 12/29/2011 1003   HGB 9.4* 05/02/2014 0443   HGB 12.9* 12/29/2011 1003   HCT  27.6* 05/02/2014 0443   HCT 38.5 12/29/2011 1003   PLT 172 05/02/2014 0443   PLT 272 12/29/2011 1003   MCV 96.5 05/02/2014 0443   MCV 94.9 12/29/2011 1003   MCH 32.9 05/02/2014 0443   MCH 31.8 12/29/2011 1003   MCHC 34.1 05/02/2014 0443   MCHC 33.5 12/29/2011 1003   RDW 20.8* 05/02/2014 0443   RDW 14.2 12/29/2011 1003   LYMPHSABS 0.5* 04/29/2014 0750   LYMPHSABS 1.5 12/29/2011 1003   MONOABS 0.2 04/29/2014 0750   MONOABS 1.4* 12/29/2011 1003   EOSABS 0.0 04/29/2014 0750   EOSABS 0.3 12/29/2011 1003   BASOSABS 0.0 04/29/2014 0750   BASOSABS 0.0 12/29/2011 1003     Assessment: 1. 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 Scr stable and UO good 2. Cardiomyopathy, EF 40% 3. Vol overload, improving 4. Anemia on aranesp  5. Hx PE sp IVC filter Plan: 1. Hopefully Scr will start trending down now that it has stabilized 2.  Decrease lasix to BID 3. Recheck labs  in AM Bryan Hubbard T

## 2014-05-02 NOTE — Progress Notes (Signed)
ANTICOAGULATION CONSULT NOTE - Follow Up Consult  Pharmacy Consult for Heparin  Indication: atrial fibrillation  No Known Allergies  Patient Measurements: Height: 5\' 10"  (177.8 cm) Weight: 200 lb 2.8 oz (90.8 kg) IBW/kg (Calculated) : 73  Vital Signs: Temp: 98 F (36.7 C) (08/05 0000) Temp src: Oral (08/05 0000) BP: 95/60 mmHg (08/05 0539) Pulse Rate: 105 (08/05 0539)  Labs:  Recent Labs  04/30/14 0636 05/01/14 0240 05/01/14 1345 05/01/14 1555 05/02/14 0443  HGB 8.0* 8.4*  --   --  9.4*  HCT 23.3* 24.7*  --   --  27.6*  PLT 133* 150  --   --  172  APTT  --   --  25  --  55*  HEPARINUNFRC  --   --  >2.20* >2.20* >2.20*  CREATININE 3.19* 3.33*  --   --  3.19*    Estimated Creatinine Clearance: 23 ml/min (by C-G formula based on Cr of 3.19).  Medications:  Heparin 1000 units/hr  Assessment: 73 y/o M on heparin while apixaban on hold. APTT is 55 and HL is >2.20 (HL influenced by apixaban) so will dose using aPTT for now.   Goal of Therapy:  Heparin level 0.3-0.7 units/ml aPTT 66-102 seconds Monitor platelets by anticoagulation protocol: Yes   Plan:  -Increase heparin drip to 1150 units/hr -1400 aPTT/HL -Daily aPTT/HL/CBC -Monitor for bleeding  Narda Bonds 05/02/2014,6:00 AM

## 2014-05-03 ENCOUNTER — Inpatient Hospital Stay (HOSPITAL_COMMUNITY): Payer: Medicare Other

## 2014-05-03 LAB — RENAL FUNCTION PANEL
ALBUMIN: 3.7 g/dL (ref 3.5–5.2)
Anion gap: 23 — ABNORMAL HIGH (ref 5–15)
BUN: 153 mg/dL — ABNORMAL HIGH (ref 6–23)
CO2: 28 meq/L (ref 19–32)
CREATININE: 2.93 mg/dL — AB (ref 0.50–1.35)
Calcium: 9 mg/dL (ref 8.4–10.5)
Chloride: 87 mEq/L — ABNORMAL LOW (ref 96–112)
GFR calc Af Amer: 23 mL/min — ABNORMAL LOW (ref >90)
GFR calc non Af Amer: 20 mL/min — ABNORMAL LOW (ref >90)
GLUCOSE: 154 mg/dL — AB (ref 70–99)
POTASSIUM: 2.8 meq/L — AB (ref 3.7–5.3)
Phosphorus: 6.8 mg/dL — ABNORMAL HIGH (ref 2.3–4.6)
Sodium: 138 mEq/L (ref 137–147)

## 2014-05-03 LAB — CBC
HEMATOCRIT: 27.3 % — AB (ref 39.0–52.0)
HEMOGLOBIN: 9.4 g/dL — AB (ref 13.0–17.0)
MCH: 32.8 pg (ref 26.0–34.0)
MCHC: 34.4 g/dL (ref 30.0–36.0)
MCV: 95.1 fL (ref 78.0–100.0)
Platelets: 174 10*3/uL (ref 150–400)
RBC: 2.87 MIL/uL — AB (ref 4.22–5.81)
RDW: 20.9 % — AB (ref 11.5–15.5)
WBC: 11.1 10*3/uL — ABNORMAL HIGH (ref 4.0–10.5)

## 2014-05-03 LAB — HEPARIN LEVEL (UNFRACTIONATED): Heparin Unfractionated: 1.75 IU/mL — ABNORMAL HIGH (ref 0.30–0.70)

## 2014-05-03 LAB — APTT: aPTT: 95 seconds — ABNORMAL HIGH (ref 24–37)

## 2014-05-03 MED ORDER — POTASSIUM CHLORIDE CRYS ER 20 MEQ PO TBCR
40.0000 meq | EXTENDED_RELEASE_TABLET | ORAL | Status: AC
  Administered 2014-05-03: 40 meq via ORAL
  Filled 2014-05-03: qty 4

## 2014-05-03 MED ORDER — POTASSIUM CHLORIDE CRYS ER 20 MEQ PO TBCR
30.0000 meq | EXTENDED_RELEASE_TABLET | ORAL | Status: DC
  Administered 2014-05-03: 30 meq via ORAL
  Filled 2014-05-03 (×2): qty 1

## 2014-05-03 MED ORDER — FUROSEMIDE 10 MG/ML IJ SOLN
160.0000 mg | Freq: Two times a day (BID) | INTRAVENOUS | Status: DC
  Administered 2014-05-03 – 2014-05-05 (×5): 160 mg via INTRAVENOUS
  Filled 2014-05-03 (×7): qty 16

## 2014-05-03 NOTE — Progress Notes (Signed)
ANTICOAGULATION CONSULT NOTE - Follow Up Consult  Pharmacy Consult for Heparin  Indication: atrial fibrillation  No Known Allergies  Patient Measurements: Height: 5\' 10"  (177.8 cm) Weight: 176 lb 5.9 oz (80 kg) IBW/kg (Calculated) : 73  Vital Signs: Temp: 97.3 F (36.3 C) (08/06 0817) Temp src: Oral (08/06 0817) BP: 95/71 mmHg (08/06 0817) Pulse Rate: 92 (08/06 0621)  Labs:  Recent Labs  05/01/14 0240  05/01/14 1555 05/02/14 0443 05/02/14 1340 05/03/14 0346  HGB 8.4*  --   --  9.4*  --  9.4*  HCT 24.7*  --   --  27.6*  --  27.3*  PLT 150  --   --  172  --  174  APTT  --   < >  --  55* 81* 95*  HEPARINUNFRC  --   < > >2.20* >2.20*  --  1.75*  CREATININE 3.33*  --   --  3.19*  --  2.93*  < > = values in this interval not displayed.  Estimated Creatinine Clearance: 22.8 ml/min (by C-G formula based on Cr of 2.93).  Medications:  Heparin 1000 units/hr  Assessment: 73 y/o M on heparin while apixaban on hold in setting of acute on chronic renal failure. Heparin level remains elevated d/t recent apixaban(last dose 8/4) but aptt is at goal at 95. No bleeding issues noted, CBC is stable. Scr trending down now.  Goal of Therapy:  Heparin level 0.3-0.7 units/ml aPTT 66-102 seconds Monitor platelets by anticoagulation protocol: Yes   Plan:  -No heparin changes needed -Daily heparin level and aPTT  Erin Hearing PharmD., BCPS Clinical Pharmacist Pager [phone] 05/03/2014 10:12 AM

## 2014-05-03 NOTE — Progress Notes (Signed)
Occupational Therapy Treatment Patient Details Name: Bryan Hubbard MRN: [id number] DOB: [birth year] Today's Date: 05/03/2014    History of present illness This 73 y.o. 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 LE edema. Diagnosis:  CHF exacerbation; A-fib; orthostatic hypotension; NSTEMI.  He underwent cardioversion 03/29/2014 due to aflutter with RVR.  He has continued with chronic hypoxemia respiratory failure presumed due to PE, pulmonary edema and underlying parenchymal lung disease (Goodpature syndrome).  Chest CT 04/23/14 with questionable worsening Goodpasture's syndrome, but  he had normal antibody panel making Goodpastures unlikely. Underwent plasmapheresis x 2.  He has continued Hypoxemia and is currently (04/27/14) on High flow 02 and PT/OT were ordered to attempt EOB activity.     OT comments  Pt with very flat affect today and minimally communicative.  He deferred OOB or EOB activity today due to feeling fatigued - states he just feels exhausted.  Explained need to be up, but he requested to rest today with plan for OOB tomorrow.  He did agree to minimal bil. UE resisted exercise.  Sats remained 85-89% during exercise and quickly recovered to 93-95%.   He had acute onset Rt flank pain 9-10/10 which subsided within 60-90 seconds.  RN notified.   Follow Up Recommendations  LTACH (feel he will be best served on [facility])    Equipment Recommendations       Recommendations for Other Services      Precautions / Restrictions Precautions Precautions: Fall Precaution Comments: high flow O2       Mobility Bed Mobility                  Transfers                      Balance                                   ADL                                         General  ADL Comments: Pt supine, states he is fatigued today.  Very flat affect.  Pt asked to defer EOB or OOB activity today due to fatigue. Spoke with pt about his mood/affect and he states he feel down today, but feels it is mostly due to physical fatigue and would prefer to rest today with plan for OOB tomorrow.  He was agreeable to minimal UE exercise      Vision                     Perception     Praxis      Cognition   Behavior During Therapy: [facility] for tasks assessed/performed;Flat affect Overall Cognitive Status: Within Functional Limits for tasks assessed                       Extremity/Trunk Assessment               Exercises General Exercises - Upper Extremity Shoulder Flexion: Strengthening;Right;Left;10 reps;Supine (moderate resistance provided.) Shoulder Extension: Strengthening;Right;Left;10 reps;Supine (moderate resistance provided.) Elbow Flexion: Right;Left;Strengthening;10 reps;Supine (moderate resistance provided.) Elbow  Extension: Strengthening;Right;Left;Supine;10 reps (moderate resistance provided.)   Shoulder Instructions       General Comments      Pertinent Vitals/ Pain          Home Living                                          Prior Functioning/Environment              Frequency Min 3X/week     Progress Toward Goals  OT Goals(current goals can now be found in the care plan section)  Progress towards OT goals: Not progressing toward goals - comment (fatigue)  ADL Goals Pt Will Perform Eating: with modified independence;sitting Pt Will Perform Grooming: with set-up;sitting Additional ADL Goal #1: Pt will be able to tolerate EOB sitting with min guard assist x  5 mins with 02 sats at least 90% Additional ADL Goal #2: Pt will be able to perform bil. UE exercises bed level, maintaining sats >90%  Plan Discharge plan needs to be updated    Co-evaluation                 End of Session Equipment  Utilized During Treatment: Oxygen (high flow)   Activity Tolerance Patient limited by fatigue   Patient Left in bed;with call bell/phone within reach   Nurse Communication Other (comment) (flat affect/mood)        Time: 5284-1324 OT Time Calculation (min): 16 min  Charges: OT General Charges $OT Visit: 1 Procedure OT Treatments $Therapeutic Exercise: 8-22 mins  Celsey Asselin M 05/03/2014, 1:41 PM

## 2014-05-03 NOTE — Progress Notes (Signed)
Per Dr. Oliver Pila, ok for acupuncturist to treat patient in the hospital.  Rush Farmer, M.D. [facility] Pulmonary/Critical Care Medicine. Pager: [phone]. After hours pager: [phone].

## 2014-05-03 NOTE — Progress Notes (Signed)
PULMONARY / CRITICAL CARE MEDICINE  Name: Bryan Hubbard MRN: [id number] DOB: 1941-09-16    ADMISSION DATE:  04/23/2014 CONSULTATION DATE:  03/31/2014  REFERRING MD :  Thereasa Solo  PRIMARY SERVICE:  TRH  CHIEF COMPLAINT:  Hypoxemic respiratory failure  BRIEF PATIENT DESCRIPTION: [name] with PMH of Goodpastures - O2 dependent, presents with worsening SOB, dizziness, weakness and LE swelling.  Cards consulted and did not feel that SOB was due to a primary cardiac reason.  PCCM was consulted for further evaluation.  SIGNIFICANT EVENTS / STUDIES:  3/27 TTE >>> PAP 45 torr 7/3   Chest CT >>> bilateral interstitial lung abnormality without nodules or consolidation, no effusion  7/7   VQ scan >>> intermediate probability of PE, new acute perfusion defect LUL since prior VQ in 12/2013  7/7   PFT >>> restrictive pattern, severe diffusion impairment, significant bronchodilator responce  7/8   Venous duplex >>> no clot  7/24 DC cardioversion 7/27 CT chest high resolution> worsening traction bronchiectasis and diffuse GGO worrisome for pneumonitis/Goodpasture syndrome 7/28 plasmapheresis x2 sessions through 7/29 7/29 Anti GBM, mpo, pr3 ab all negative 7/30 Cytoxan stopped 8/4   Code status changed to DNR / DNI  LINES / TUBES:  CULTURES: Urine 7/22 >> coag neg staph (likely contaminant) Blood 7/22 >> neg  ANTIBIOTICS: Bactrim 7/30 >>> 8/1  INTERVAL HISTORY:  Tired, no significant dyspnea, oxygen requirements unchanged.  VITAL SIGNS: Temp:  [97.3 F (36.3 C)-98.6 F (37 C)] 97.3 F (36.3 C) (08/06 0817) Pulse Rate:  [66-114] 92 (08/06 0621) Resp:  [14-25] 19 (08/06 0817) BP: (90-103)/(58-73) 95/71 mmHg (08/06 0817) SpO2:  [90 %-96 %] 92 % (08/06 0903) FiO2 (%):  [60 %] 60 % (08/06 0903) Weight:  [80 kg (176 lb 5.9 oz)] 80 kg (176 lb 5.9 oz) (08/06 0300)  HEMODYNAMICS:   VENTILATOR SETTINGS: Vent Mode:  [-]  FiO2 (%):  [60 %] 60 %  INTAKE / OUTPUT: Intake/Output     08/05 0701  - 08/06 0700 08/06 0701 - 08/07 0700   P.O. 400 120   I.V. (mL/kg) 276 (3.5) 23 (0.3)   IV Piggyback 60    Total Intake(mL/kg) 736 (9.2) 143 (1.8)   Urine (mL/kg/hr) 3000 (1.6)    Stool     Total Output 3000     Net -2264 +143         PHYSICAL EXAMINATION: General: Fatigued after working with PT but in NAD, in recliner with PT assist. HEENT: High flow nasal cannula in place. PULM: Slight rhonchi bilaterally, otherwise clear. CV: IRIR, no M/R/G. AB: Soft, nontender. Ex: Bilateral LE edema. Neuro: Awake, alert.  LABS: CBC  Recent Labs Lab 05/01/14 0240 05/02/14 0443 05/03/14 0346  WBC 8.8 9.3 11.1*  HGB 8.4* 9.4* 9.4*  HCT 24.7* 27.6* 27.3*  PLT 150 172 174   Coag's  Recent Labs Lab 04/27/14 0311  05/02/14 0443 05/02/14 1340 05/03/14 0346  APTT  --   < > 55* 81* 95*  INR 1.21  --   --   --   --   < > = values in this interval not displayed. BMET  Recent Labs Lab 05/01/14 0240 05/02/14 0443 05/03/14 0346  NA 131* 135* 138  K 3.9 3.6* 2.8*  CL 85* 87* 87*  CO2 22 24 28   BUN 147* 149* 153*  CREATININE 3.33* 3.19* 2.93*  GLUCOSE 160* 174* 154*    Electrolytes  Recent Labs Lab 04/29/14 0750  04/30/14 0636 05/01/14 0240 05/02/14  1287 05/03/14 0346  CALCIUM 9.0  --  8.6 8.7 8.9 9.0  MG 2.7*  --  2.7*  --   --   --   PHOS  --   < > 8.9* 9.1* 8.5* 6.8*  < > = values in this interval not displayed. Sepsis Markers No results found for this basename: LATICACIDVEN, PROCALCITON, O2SATVEN,  in the last 168 hours  ABG No results found for this basename: PHART, PCO2ART, PO2ART,  in the last 168 hours  Liver Enzymes  Recent Labs Lab 04/27/14 0311 04/29/14 0750 05/01/14 0240 05/02/14 0443 05/03/14 0346  AST 17 28  --   --   --   ALT 10 20  --   --   --   ALKPHOS 32* 41  --   --   --   BILITOT 0.7 0.5  --   --   --   ALBUMIN 3.7 3.7 3.8 3.7 3.7   Cardiac Enzymes No results found for this basename: TROPONINI, PROBNP,  in the last 168  hours  Glucose No results found for this basename: GLUCAP,  in the last 168 hours  IMAGING: Dg Chest Port 1 View  05/03/2014   CLINICAL DATA:  Pneumonitis.  Goodpasture syndrome.  EXAM: PORTABLE CHEST - 1 VIEW  COMPARISON:  04/30/2014 and 04/27/2014 and 12/14/2013 and chest CT dated 04/23/2014  FINDINGS: Heart size and pulmonary vascularity are normal. Chronic interstitial lung disease appears stable. No acute infiltrates or effusions. No acute osseous abnormalities.  IMPRESSION: Stable interstitial lung disease.   Electronically Signed   By: Rozetta Nunnery M.D.   On: 05/03/2014 07:31    ASSESSMENT / PLAN:  Acute hypoxemic respiratory failure  High flow O2 for goal SpO2 > 92%.  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  Doree Fudge, MD Pulmonary and [place] Pager: [phone]  05/03/2014, 10:47 AM

## 2014-05-03 NOTE — Progress Notes (Signed)
S: No new CO O:BP 95/71  Pulse 92  Temp(Src) 97.3 F (36.3 C) (Oral)  Resp 19  Ht 5\' 10"  (1.778 m)  Wt 80 kg (176 lb 5.9 oz)  BMI 25.31 kg/m2  SpO2 96%  Intake/Output Summary (Last 24 hours) at 05/03/14 0833 Last data filed at 05/03/14 0700  Gross per 24 hour  Intake  724.5 ml  Output   3000 ml  Net -2275.5 ml   Weight change: -10.8 kg (-23 lb 13 oz) HKV:QQVZD and alert CVS:RRR Resp: Bil crackles are less Abd:+ BS NTND Ext: 2+ edema NEURO:CNI Ox3 no asterixis   . antiseptic oral rinse  15 mL Mouth Rinse BID  . calcium acetate  667 mg Oral TID WC  . clonazePAM  0.25 mg Oral BID  . [START ON 05/10/2014] cyanocobalamin  1,000 mcg Subcutaneous Q30 days  . cyclobenzaprine  5 mg Oral QHS  . darbepoetin (ARANESP) injection - NON-DIALYSIS  60 mcg Subcutaneous Q Wed-1800  . tamsulosin  0.4 mg Oral Daily   And  . dutasteride  0.5 mg Oral Daily  . feeding supplement (NEPRO CARB STEADY)  237 mL Oral BID BM  . furosemide  100 mg Intravenous BID  . latanoprost  1 drop Both Eyes q morning - 10a  . levalbuterol  1.25 mg Nebulization TID  . methylPREDNISolone (SOLU-MEDROL) injection  60 mg Intravenous Daily  . metolazone  5 mg Oral Q24H  . metoprolol tartrate  25 mg Oral 4 times per day  . montelukast  10 mg Oral QHS  . nystatin  5 mL Oral QID  . pantoprazole  40 mg Oral Q1200  . potassium chloride  40 mEq Oral Q4H   Dg Chest Port 1 View  05/03/2014   CLINICAL DATA:  Pneumonitis.  Goodpasture syndrome.  EXAM: PORTABLE CHEST - 1 VIEW  COMPARISON:  04/30/2014 and 04/27/2014 and 12/14/2013 and chest CT dated 04/23/2014  FINDINGS: Heart size and pulmonary vascularity are normal. Chronic interstitial lung disease appears stable. No acute infiltrates or effusions. No acute osseous abnormalities.  IMPRESSION: Stable interstitial lung disease.   Electronically Signed   By: Rozetta Nunnery M.D.   On: 05/03/2014 07:31   BMET    Component Value Date/Time   NA 138 05/03/2014 0346   K 2.8* 05/03/2014  0346   CL 87* 05/03/2014 0346   CO2 28 05/03/2014 0346   GLUCOSE 154* 05/03/2014 0346   BUN 153* 05/03/2014 0346   CREATININE 2.93* 05/03/2014 0346   CALCIUM 9.0 05/03/2014 0346   GFRNONAA 20* 05/03/2014 0346   GFRAA 23* 05/03/2014 0346   CBC    Component Value Date/Time   WBC 11.1* 05/03/2014 0346   WBC 10.4* 12/29/2011 1003   RBC 2.87* 05/03/2014 0346   RBC 2.70* 01/20/2014 0913   RBC 4.05* 12/29/2011 1003   HGB 9.4* 05/03/2014 0346   HGB 12.9* 12/29/2011 1003   HCT 27.3* 05/03/2014 0346   HCT 38.5 12/29/2011 1003   PLT 174 05/03/2014 0346   PLT 272 12/29/2011 1003   MCV 95.1 05/03/2014 0346   MCV 94.9 12/29/2011 1003   MCH 32.8 05/03/2014 0346   MCH 31.8 12/29/2011 1003   MCHC 34.4 05/03/2014 0346   MCHC 33.5 12/29/2011 1003   RDW 20.9* 05/03/2014 0346   RDW 14.2 12/29/2011 1003   LYMPHSABS 0.5* 04/29/2014 0750   LYMPHSABS 1.5 12/29/2011 1003   MONOABS 0.2 04/29/2014 0750   MONOABS 1.4* 12/29/2011 1003   EOSABS 0.0 04/29/2014 0750   EOSABS  0.3 12/29/2011 1003   BASOSABS 0.0 04/29/2014 0750   BASOSABS 0.0 12/29/2011 1003     Assessment: 1. 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 Scr trending down and UO good 2. Cardiomyopathy, EF 40% 3. Vol overload, improving 4. Anemia on aranesp  5. Hx PE sp IVC filter 6. hypokalemia Plan: 1. Replace K 2. Cont lasix but will increase dose and DC metolazone as I think it is depleting his K more 3. Recheck labs in AM      Gladie Gravette T

## 2014-05-03 NOTE — Progress Notes (Signed)
[facility] TEAM 1 - Stepdown/ICU TEAM Progress Note  Bryan Hubbard UXN:[id number] DOB: 05-05-1941 DOA: 04/15/2014 PCP: [name], MD  Admit HPI / Brief Narrative: 73 yo M w/ Hx chronic atrial flutter, DVT, Goodpasture's, and recent oxygen dependent respiratory failure after hospitalization for community acquired pneumonia last month who presented to the hospital with progressive shortness of breath and lower extremity edema. In addition he was becoming very dizzy and very weak with standing. He notified his cardiologist of these symptoms several days prior to presentation and was instructed to decrease his metoprolol to 25 mg by mouth twice a day. Unfortunately his symptoms did not improve.   He presented to the hospital where it was noted that his systolic blood pressure was in the upper 80s and upon standing dropped into the 70s. Review of patient medication revealed he was taking Lasix 20 mg daily and had continued to take this medication prior to presentation. In addition he endorsed having to increase his oxygen at home from 2 L to 4 L because of progressive shortness of breath. A two-view chest x-ray demonstrated acutely worsened diffuse bilateral airspace opacification with a peripheral distribution and diffuse increased interstitial markings that could either be acute infectious or inflammatory interstitial pneumonitis.   HPI/Subjective: Alert and denies new or worsened symptoms  Assessment/Plan:  Atrial flutter with rapid ventricular response-maintaining NSR  -as per Cardiology - they will follow at a distance since has been stable  -Initially failed attempts to use cardizem, BB, or both to control RVR  -Ibutilide infusion was unsuccessful > DCCV successful under bedside anesthesia  -maintaining NSR at this time  -since had prolonged RVR we checked ECHO which demonstrated a decrease in systolic function from 25-42% to 40-45%  -Eliquis had been held  initially due to expected  issues with lower clotting factors while on plasmapheresis but also held to ensure no further melena -given worsened CrCl have opted to dc Eliquis for now in favor of IV Heparin  Goodpasture's  -chronically treated with steroid, oral cytoxan and prn plasmapheresis  -per Pulmonary chest x-ray was consistent with interstitial lung disease of uncertain etiology - due to current respiratory compromise unable to proceed with biopsy. -Patient subsequently underwent plasmapheresis catheter insertion 7/28 by IR and began plasmapheresis (see below) but serologies were negative for Goodpastures etiology so plasmapheresis dcd 7/30 and catheter discontinued 7/31 per IR  -Pulmonary symptoms ? secondary to Cytoxan toxicity; PCCM. Stopped Cytoxan  -High dose Solu-Medrol has been tapered to a much lower q 12 hr dosing schedule  Acute on chronic respiratory failure with hypoxia:  -Patient continues to require high flow nasal cannula in order to maintain SpO2 greater than 92% while resting-no improvement in sx's Dr. [place] Bells d/w pt and wife the role of mechanical ventilation and that if he further deteriorates that he would NOT benefit from mechanical ventilation and in fact mechanical ventilation would likely hurt hime- pt now DNR but still pursuing aggressive care -continue mobilization efforts -Pulmonary expects prolonged hospitalization and once more stable from a renal standpoint should consider LTAC  Acute systolic CHF  -See echocardiogram below   ?? Opportunistic infection  -Initially attempted to gently diurese but unfortunately only achieved worsened renal function without any improvement in respiratory status  -High resolution CT Chest 7/27 ordered by pulmonary with findings c/w Goodpastures and plasmapheresis initiated 7/28 and as of 7/29 respiratory sx's appear to stabilized  -7/30 pulmonary adjusted to very high dose steroids- pt declined bronchoscopy   -Pulmonary also concerned over  cytoxan  mediated fibrosis so Cytoxan was dc'd (7/30); Solu-Medrol per pulmonology  -Bactrim DC'd by PCCM on 8/2   Glomerulonephritis /Acute kidney Injury on Chronic kidney disease stage 3, GFR 30-59 ml/min  -Nephrology reconsulted and suspected may have volume overload so high dose Lasix initiated 8/2 and thus far in a negative 7913 cc balance -renal function now improving with aggressive diuresis -pedal edema also decreasing  Oral candida -began Nystatin s/s 8/4  Transient melena  -Occurred 7/30 and has not recurred so anticoagulation resumed 8/3  Hypoxemic mediated anxiety  -Klonopin has helped significantly with sx's   Abnormal UA  -cx positive only for 20,000 colonies of coag neg staph - further tx not currently indicated   Hematuria (recurrent)  -pt states this is a recurring problem but at present remains stable on anti coagulation  Anemia of chronic disease  -Continue to hold stable - baseline between 7.9 and 9   Thrombocytopenia  - resolved  HYPERLIPIDEMIA   HYPERTENSION  -BP controlled  History of DVT / PE / IVC filter  -was transitioned from warfarin to eliquis during the last hospitalization at recommendation of the Pulmonologist in setting of inability to maintain therapeutic INR on warfarin. -Eliquis dc'd in favor of Heparin due to renal function  Hyperkalemia -Most likely caused by addition of Bactrim to treat empirically PCP; PCCM. DC'd on 8/2 -Resolving, will DC Kayexalate  Code Status: FULL Family Communication: Wife at bedside Disposition Plan: SDU until renal fnx stable then consider LTAC  Consultants: Dr. Saralyn Pilar Wright/Dr. Roselie Awkward (PCCM.) Dr. Edrick Oh (nephrology)  Procedure/Significant Events: 7/29 echocardiogram - Left ventricle: mild LVH. LVEF= 40% to 45%.  - Mitral valve:  mild regurgitation. - Tricuspid valve:  moderate regurgitation.  Culture Urine culture coag neg staph  DVT prophylaxis: heparin  Objective: VITAL  SIGNS: Temp: 97.5 F (36.4 C) (08/06 1125) Temp src: Oral (08/06 1125) BP: 81/66 mmHg (08/06 1125) Pulse Rate: 92 (08/06 0621) SPO2; 94% on high flow nasal cannula, 40 L O2 FIO2: 65%   Intake/Output Summary (Last 24 hours) at 05/03/14 1225 Last data filed at 05/03/14 0900  Gross per 24 hour  Intake  721.5 ml  Output   2350 ml  Net -1628.5 ml   Exam: General: No acute respiratory distress Lungs: CTA without crackles or wheezes, HFNC oxygen Cardiovascular: Regular rate and rhythm without murmur gallop or rub normal S1 and S2-peripheral edema below knees 1+ bilaterally Abdomen: Nontender, nondistended, soft, bowel sounds positive, no rebound, no ascites, no appreciable mass Extremities: No significant cyanosis, clubbing, or edema bilateral lower extremities  Data Reviewed: Basic Metabolic Panel:  Recent Labs Lab 04/29/14 0750  04/30/14 0220 04/30/14 0636 05/01/14 0240 05/02/14 0443 05/03/14 0346  NA 133*  --   --  135* 131* 135* 138  K 5.3  < > 4.8 4.5 3.9 3.6* 2.8*  CL 87*  --   --  89* 85* 87* 87*  CO2 26  --   --  24 22 24 28   GLUCOSE 112*  --   --  127* 160* 174* 154*  BUN 130*  --   --  140* 147* 149* 153*  CREATININE 3.06*  --   --  3.19* 3.33* 3.19* 2.93*  CALCIUM 9.0  --   --  8.6 8.7 8.9 9.0  MG 2.7*  --   --  2.7*  --   --   --   PHOS  --   --   --  8.9* 9.1* 8.5* 6.8*  < > =  values in this interval not displayed.  Liver Function Tests:  Recent Labs Lab 04/27/14 0311 04/29/14 0750 05/01/14 0240 05/02/14 0443 05/03/14 0346  AST 17 28  --   --   --   ALT 10 20  --   --   --   ALKPHOS 32* 41  --   --   --   BILITOT 0.7 0.5  --   --   --   PROT 4.7* 5.1*  --   --   --   ALBUMIN 3.7 3.7 3.8 3.7 3.7   CBC:  Recent Labs Lab 04/29/14 0750 04/30/14 0636 05/01/14 0240 05/02/14 0443 05/03/14 0346  WBC 8.4 7.0 8.8 9.3 11.1*  NEUTROABS 7.8*  --   --   --   --   HGB 8.1* 8.0* 8.4* 9.4* 9.4*  HCT 24.0* 23.3* 24.7* 27.6* 27.3*  MCV 94.9 94.3 95.0  96.5 95.1  PLT 156 133* 150 172 174    Studies:  Recent x-ray studies have been reviewed in detail by the Attending Physician  Scheduled Meds:  Scheduled Meds: . antiseptic oral rinse  15 mL Mouth Rinse BID  . calcium acetate  667 mg Oral TID WC  . clonazePAM  0.25 mg Oral BID  . [START ON 05/10/2014] cyanocobalamin  1,000 mcg Subcutaneous Q30 days  . cyclobenzaprine  5 mg Oral QHS  . darbepoetin (ARANESP) injection - NON-DIALYSIS  60 mcg Subcutaneous Q Wed-1800  . tamsulosin  0.4 mg Oral Daily   And  . dutasteride  0.5 mg Oral Daily  . feeding supplement (NEPRO CARB STEADY)  237 mL Oral BID BM  . furosemide  160 mg Intravenous BID  . latanoprost  1 drop Both Eyes q morning - 10a  . levalbuterol  1.25 mg Nebulization TID  . methylPREDNISolone (SOLU-MEDROL) injection  60 mg Intravenous Daily  . metoprolol tartrate  25 mg Oral 4 times per day  . nystatin  5 mL Oral QID  . pantoprazole  40 mg Oral Q1200    Time spent on care of this patient: 35 mins   ELLIS,ALLISON L. , ANP   [facility] Office  [phone] Pager - [phone]  On-Call/Text Page:      [email or web address]      password [email or web address]  If 7PM-7AM, please contact night-coverage [email or web address] Password [email or web address] 05/03/2014, 12:25 PM   LOS: 16 days   Examined patient with ANP Ebony Hail and agree with assessment and plan. Patient with multiple complex medical problems direct patient care> 40 minute

## 2014-05-04 DIAGNOSIS — G4700 Insomnia, unspecified: Secondary | ICD-10-CM

## 2014-05-04 LAB — CBC
HCT: 29 % — ABNORMAL LOW (ref 39.0–52.0)
Hemoglobin: 9.8 g/dL — ABNORMAL LOW (ref 13.0–17.0)
MCH: 32.9 pg (ref 26.0–34.0)
MCHC: 33.8 g/dL (ref 30.0–36.0)
MCV: 97.3 fL (ref 78.0–100.0)
Platelets: 186 10*3/uL (ref 150–400)
RBC: 2.98 MIL/uL — AB (ref 4.22–5.81)
RDW: 21.1 % — ABNORMAL HIGH (ref 11.5–15.5)
WBC: 11.3 10*3/uL — ABNORMAL HIGH (ref 4.0–10.5)

## 2014-05-04 LAB — RENAL FUNCTION PANEL
ANION GAP: 19 — AB (ref 5–15)
Albumin: 3.7 g/dL (ref 3.5–5.2)
BUN: 148 mg/dL — ABNORMAL HIGH (ref 6–23)
CO2: 31 meq/L (ref 19–32)
Calcium: 9.3 mg/dL (ref 8.4–10.5)
Chloride: 88 mEq/L — ABNORMAL LOW (ref 96–112)
Creatinine, Ser: 2.76 mg/dL — ABNORMAL HIGH (ref 0.50–1.35)
GFR calc non Af Amer: 21 mL/min — ABNORMAL LOW (ref >90)
GFR, EST AFRICAN AMERICAN: 24 mL/min — AB (ref >90)
GLUCOSE: 112 mg/dL — AB (ref 70–99)
POTASSIUM: 3.6 meq/L — AB (ref 3.7–5.3)
Phosphorus: 5.8 mg/dL — ABNORMAL HIGH (ref 2.3–4.6)
SODIUM: 138 meq/L (ref 137–147)

## 2014-05-04 LAB — APTT: APTT: 96 s — AB (ref 24–37)

## 2014-05-04 LAB — HEPARIN LEVEL (UNFRACTIONATED): HEPARIN UNFRACTIONATED: 1.68 [IU]/mL — AB (ref 0.30–0.70)

## 2014-05-04 MED ORDER — OXYCODONE-ACETAMINOPHEN 5-325 MG PO TABS
1.0000 | ORAL_TABLET | Freq: Four times a day (QID) | ORAL | Status: DC | PRN
  Administered 2014-05-04 – 2014-05-06 (×5): 2 via ORAL
  Administered 2014-05-07: 1 via ORAL
  Administered 2014-05-07: 2 via ORAL
  Filled 2014-05-04 (×3): qty 2
  Filled 2014-05-04: qty 1
  Filled 2014-05-04 (×3): qty 2

## 2014-05-04 MED ORDER — TRAZODONE HCL 100 MG PO TABS
100.0000 mg | ORAL_TABLET | Freq: Every day | ORAL | Status: DC
  Administered 2014-05-04 – 2014-05-06 (×3): 100 mg via ORAL
  Filled 2014-05-04 (×4): qty 1
  Filled 2014-05-04: qty 2
  Filled 2014-05-04: qty 1

## 2014-05-04 MED ORDER — PREDNISONE 20 MG PO TABS
40.0000 mg | ORAL_TABLET | Freq: Every day | ORAL | Status: AC
  Administered 2014-05-04 – 2014-05-06 (×3): 40 mg via ORAL
  Filled 2014-05-04 (×3): qty 2

## 2014-05-04 NOTE — Progress Notes (Signed)
PULMONARY / CRITICAL CARE MEDICINE  Name: Bryan Hubbard MRN: [id number] DOB: 01-04-1941    ADMISSION DATE:  04/27/2014 CONSULTATION DATE:  03/31/2014  REFERRING MD :  Thereasa Solo  PRIMARY SERVICE:  TRH  CHIEF COMPLAINT:  Hypoxemic respiratory failure  BRIEF PATIENT DESCRIPTION: [name] with PMH of Goodpastures - O2 dependent, presents with worsening SOB, dizziness, weakness and LE swelling.  Cards consulted and did not feel that SOB was due to a primary cardiac reason.  PCCM was consulted for further evaluation.  SIGNIFICANT EVENTS / STUDIES:  3/27 TTE >>> PAP 45 torr 7/3   Chest CT >>> bilateral interstitial lung abnormality without nodules or consolidation, no effusion  7/7   VQ scan >>> intermediate probability of PE, new acute perfusion defect LUL since prior VQ in 12/2013  7/7   PFT >>> restrictive pattern, severe diffusion impairment, significant bronchodilator responce  7/8   Venous duplex >>> no clot  7/24 DC cardioversion 7/27 CT chest high resolution> worsening traction bronchiectasis and diffuse GGO worrisome for pneumonitis/Goodpasture syndrome 7/28 plasmapheresis x2 sessions through 7/29 7/29 Anti GBM, mpo, pr3 ab all negative 7/30 Cytoxan stopped 8/4   Code status changed to DNR / DNI  LINES / TUBES:  CULTURES: Urine 7/22 >> coag neg staph (likely contaminant) Blood 7/22 >> neg  ANTIBIOTICS: Bactrim 7/30 >>> 8/1  INTERVAL HISTORY:  Depressed, back pain, didn't sleep  VITAL SIGNS: Temp:  [97.3 F (36.3 C)-97.8 F (36.6 C)] 97.8 F (36.6 C) (08/07 0257) Pulse Rate:  [1-127] 95 (08/07 0600) Resp:  [14-21] 18 (08/07 0515) BP: (81-122)/(65-81) 93/69 mmHg (08/07 0600) SpO2:  [92 %-98 %] 95 % (08/07 0515) FiO2 (%):  [60 %] 60 % (08/07 0515) Weight:  [81 kg (178 lb 9.2 oz)] 81 kg (178 lb 9.2 oz) (08/07 0257)  HEMODYNAMICS:   VENTILATOR SETTINGS: Vent Mode:  [-]  FiO2 (%):  [60 %] 60 %  INTAKE / OUTPUT: Intake/Output     08/06 0701 - 08/07 0700 08/07 0701 -  08/08 0700   P.O. 640    I.V. (mL/kg) 264.5 (3.3)    IV Piggyback 50    Total Intake(mL/kg) 954.5 (11.8)    Urine (mL/kg/hr) 2100 (1.1)    Total Output 2100     Net -1145.5           PHYSICAL EXAMINATION: General: lying in bed, upset HEENT: High flow O2 in place PULM : crackles, wheezes in bases CV: Irreg irreg AB: BS+, soft, nontender MSK: muscle atrophy hands/arms Neuro: A&O, maew  LABS: CBC  Recent Labs Lab 05/02/14 0443 05/03/14 0346 05/04/14 0250  WBC 9.3 11.1* 11.3*  HGB 9.4* 9.4* 9.8*  HCT 27.6* 27.3* 29.0*  PLT 172 174 186   Coag's  Recent Labs Lab 05/02/14 1340 05/03/14 0346 05/04/14 0250  APTT 81* 95* 96*   BMET  Recent Labs Lab 05/02/14 0443 05/03/14 0346 05/04/14 0250  NA 135* 138 138  K 3.6* 2.8* 3.6*  CL 87* 87* 88*  CO2 24 28 31   BUN 149* 153* 148*  CREATININE 3.19* 2.93* 2.76*  GLUCOSE 174* 154* 112*    Electrolytes  Recent Labs Lab 04/29/14 0750 04/30/14 0636  05/02/14 0443 05/03/14 0346 05/04/14 0250  CALCIUM 9.0 8.6  < > 8.9 9.0 9.3  MG 2.7* 2.7*  --   --   --   --   PHOS  --  8.9*  < > 8.5* 6.8* 5.8*  < > = values in this interval not  displayed. Sepsis Markers No results found for this basename: LATICACIDVEN, PROCALCITON, O2SATVEN,  in the last 168 hours  ABG No results found for this basename: PHART, PCO2ART, PO2ART,  in the last 168 hours  Liver Enzymes  Recent Labs Lab 04/29/14 0750  05/02/14 0443 05/03/14 0346 05/04/14 0250  AST 28  --   --   --   --   ALT 20  --   --   --   --   ALKPHOS 41  --   --   --   --   BILITOT 0.5  --   --   --   --   ALBUMIN 3.7  < > 3.7 3.7 3.7  < > = values in this interval not displayed. Cardiac Enzymes No results found for this basename: TROPONINI, PROBNP,  in the last 168 hours  Glucose No results found for this basename: GLUCAP,  in the last 168 hours  IMAGING: Dg Chest Port 1 View  05/03/2014   CLINICAL DATA:  Pneumonitis.  Goodpasture syndrome.  EXAM: PORTABLE  CHEST - 1 VIEW  COMPARISON:  04/30/2014 and 04/27/2014 and 12/14/2013 and chest CT dated 04/23/2014  FINDINGS: Heart size and pulmonary vascularity are normal. Chronic interstitial lung disease appears stable. No acute infiltrates or effusions. No acute osseous abnormalities.  IMPRESSION: Stable interstitial lung disease.   Electronically Signed   By: Rozetta Nunnery M.D.   On: 05/03/2014 07:31    ASSESSMENT / PLAN:  Acute hypoxemic respiratory failure  High flow O2 for goal SpO2 > 92%.  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, most recent antibodies negative due to cytoxan/pred  Change to prednisone 40mg   Plan prednisone taper over one month  Suspect will ultimately need Rituxan to prevent recurrence of MPA/Goodpastures  Back pain  Percocet  PT  acupuncture OK by me  Insomnia  Trazodone qHS  Depression  Trazodone qHS  Reactive airway disease  Xopenex  Singulair  Pulmonary edema  Renal following  Lasix   PE, s/p IVC filter  Eliquis d/c'd secondary to worsening renal fx  Heparin gtt    AF rate controlled  Cardiology following  Continue Lopressor  Hypokalemia  Addressed by primary team  I have personally obtained history, examined patient, evaluated and interpreted laboratory and imaging results, reviewed medical records, formulated assessment / plan and placed orders.  Roselie Awkward, MD [place] [facility] Pager: [phone] Cell: [phone] If no response, call [phone]   05/04/2014, 7:38 AM

## 2014-05-04 NOTE — Progress Notes (Signed)
S: Did not sleep well sec SOB.  No N/V.  Appetite poor O:BP 102/69  Pulse 95  Temp(Src) 97.4 F (36.3 C) (Oral)  Resp 18  Ht 5\' 10"  (1.778 m)  Wt 81 kg (178 lb 9.2 oz)  BMI 25.62 kg/m2  SpO2 92%  Intake/Output Summary (Last 24 hours) at 05/04/14 0830 Last data filed at 05/04/14 0700  Gross per 24 hour  Intake    823 ml  Output   2100 ml  Net  -1277 ml   Weight change: 1 kg (2 lb 3.3 oz) UMP:NTIRW and alert CVS:RRR Resp: Bil crackles are less Abd:+ BS NTND Ext: 1-2+ edema NEURO:CNI Ox3 no asterixis   . antiseptic oral rinse  15 mL Mouth Rinse BID  . calcium acetate  667 mg Oral TID WC  . clonazePAM  0.25 mg Oral BID  . [START ON 05/10/2014] cyanocobalamin  1,000 mcg Subcutaneous Q30 days  . cyclobenzaprine  5 mg Oral QHS  . darbepoetin (ARANESP) injection - NON-DIALYSIS  60 mcg Subcutaneous Q Wed-1800  . tamsulosin  0.4 mg Oral Daily   And  . dutasteride  0.5 mg Oral Daily  . feeding supplement (NEPRO CARB STEADY)  237 mL Oral BID BM  . furosemide  160 mg Intravenous BID  . latanoprost  1 drop Both Eyes q morning - 10a  . levalbuterol  1.25 mg Nebulization TID  . metoprolol tartrate  25 mg Oral 4 times per day  . nystatin  5 mL Oral QID  . pantoprazole  40 mg Oral Q1200  . predniSONE  40 mg Oral Q breakfast  . traZODone  100 mg Oral QHS   Dg Chest Port 1 View  05/03/2014   CLINICAL DATA:  Pneumonitis.  Goodpasture syndrome.  EXAM: PORTABLE CHEST - 1 VIEW  COMPARISON:  04/30/2014 and 04/27/2014 and 12/14/2013 and chest CT dated 04/23/2014  FINDINGS: Heart size and pulmonary vascularity are normal. Chronic interstitial lung disease appears stable. No acute infiltrates or effusions. No acute osseous abnormalities.  IMPRESSION: Stable interstitial lung disease.   Electronically Signed   By: Rozetta Nunnery M.D.   On: 05/03/2014 07:31   BMET    Component Value Date/Time   NA 138 05/04/2014 0250   K 3.6* 05/04/2014 0250   CL 88* 05/04/2014 0250   CO2 31 05/04/2014 0250   GLUCOSE  112* 05/04/2014 0250   BUN 148* 05/04/2014 0250   CREATININE 2.76* 05/04/2014 0250   CALCIUM 9.3 05/04/2014 0250   GFRNONAA 21* 05/04/2014 0250   GFRAA 24* 05/04/2014 0250   CBC    Component Value Date/Time   WBC 11.3* 05/04/2014 0250   WBC 10.4* 12/29/2011 1003   RBC 2.98* 05/04/2014 0250   RBC 2.70* 01/20/2014 0913   RBC 4.05* 12/29/2011 1003   HGB 9.8* 05/04/2014 0250   HGB 12.9* 12/29/2011 1003   HCT 29.0* 05/04/2014 0250   HCT 38.5 12/29/2011 1003   PLT 186 05/04/2014 0250   PLT 272 12/29/2011 1003   MCV 97.3 05/04/2014 0250   MCV 94.9 12/29/2011 1003   MCH 32.9 05/04/2014 0250   MCH 31.8 12/29/2011 1003   MCHC 33.8 05/04/2014 0250   MCHC 33.5 12/29/2011 1003   RDW 21.1* 05/04/2014 0250   RDW 14.2 12/29/2011 1003   LYMPHSABS 0.5* 04/29/2014 0750   LYMPHSABS 1.5 12/29/2011 1003   MONOABS 0.2 04/29/2014 0750   MONOABS 1.4* 12/29/2011 1003   EOSABS 0.0 04/29/2014 0750   EOSABS 0.3 12/29/2011 1003   BASOSABS  0.0 04/29/2014 0750   BASOSABS 0.0 12/29/2011 1003     Assessment: 1. 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 Scr cont to trend down and UO good 2. Cardiomyopathy, EF 40% 3. Vol overload, improving 4. Anemia on aranesp  5. Hx PE sp IVC filter Plan: 1. Cont lasix 2.  Hopefully appetite will improve as BUN decreases as he gets farther out from the large doses of steroids 3. Daily Scr     Elysha Daw T

## 2014-05-04 NOTE — Progress Notes (Signed)
ANTICOAGULATION CONSULT NOTE - Follow Up Consult  Pharmacy Consult for Heparin  Indication: atrial fibrillation  No Known Allergies  Patient Measurements: Height: 5\' 10"  (177.8 cm) Weight: 178 lb 9.2 oz (81 kg) IBW/kg (Calculated) : 73  Vital Signs: Temp: 97.4 F (36.3 C) (08/07 0811) Temp src: Oral (08/07 0811) BP: 102/69 mmHg (08/07 0811) Pulse Rate: 95 (08/07 0600)  Labs:  Recent Labs  05/02/14 0443 05/02/14 1340 05/03/14 0346 05/04/14 0250  HGB 9.4*  --  9.4* 9.8*  HCT 27.6*  --  27.3* 29.0*  PLT 172  --  174 186  APTT 55* 81* 95* 96*  HEPARINUNFRC >2.20*  --  1.75* 1.68*  CREATININE 3.19*  --  2.93* 2.76*    Estimated Creatinine Clearance: 24.2 ml/min (by C-G formula based on Cr of 2.76).  Medications:  Heparin 1150 units/hr  Assessment: 73 y/o M on heparin while apixaban on hold in setting of acute on chronic renal failure. Heparin level remains elevated d/t recent apixaban(last dose 8/4) but is trending down. Aptt is at goal at 96 and stable. Hematuria noted overnight, no changes by cardiology on call. CBC is stable. Scr continues to trend down.  Goal of Therapy:  Heparin level 0.3-0.7 units/ml aPTT 66-102 seconds Monitor platelets by anticoagulation protocol: Yes   Plan:  -No heparin changes needed -Daily heparin level and aPTT  Erin Hearing PharmD., BCPS Clinical Pharmacist Pager [phone] 05/04/2014 9:01 AM

## 2014-05-04 NOTE — Progress Notes (Signed)
Patient's urine noted to be cherry color and bloody. Pharmacist notified as well as NP on call for hospitalist group. No new orders obtained at this time.

## 2014-05-04 NOTE — Progress Notes (Signed)
Physical Therapy Treatment Patient Details Name: Bryan Hubbard MRN: [id number] DOB: [birth year] Today's Date: 05/04/2014    History of Present Illness This 73 y.o. 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 LE edema. Diagnosis:  CHF exacerbation; A-fib; orthostatic hypotension; NSTEMI.  He underwent cardioversion 04/23/2014 due to aflutter with RVR.  He has continued with chronic hypoxemia respiratory failure presumed due to PE, pulmonary edema and underlying parenchymal lung disease (Goodpature syndrome).  Chest CT 04/23/14 with questionable worsening Goodpasture's syndrome, but  he had normal antibody panel making Goodpastures unlikely. Underwent plasmapheresis x 2.  He has continued Hypoxemia and is currently (04/27/14) on High flow 02 and PT/OT were ordered to attempt EOB activity.      PT Comments    Pt making slow progress towards physical therapy goals. Was able to tolerate being lifted to the chair with sats decreasing to 85% for a brief time, and was at 94% at end of session in chair. Further transfer training at edge of chair deferred due to pt fatigue. Will continue to follow and progress as able per POC.   Follow Up Recommendations  SNF     Equipment Recommendations  None recommended by PT    Recommendations for Other Services       Precautions / Restrictions Precautions Precautions: Fall Precaution Comments: high flow O2 Restrictions Weight Bearing Restrictions: No    Mobility  Bed Mobility Overal bed mobility: Needs Assistance Bed Mobility: Rolling Rolling: Mod assist Sidelying to sit: Mod assist Supine to sit: Max assist     General bed mobility comments: OT performed bed mobility with pt prior to PT arrival to get lift pad positioned under pt in supine position. Per OT pt requires increased  time for rest breaks with mobility.   Transfers Overall transfer level: Needs assistance Equipment used: Ambulation equipment used (Maxi-move)             General transfer comment: Pt lifted to recliner with sats decreasing to 85%, but then recovering once he was in chair.  He required +2 max A to scoot hips back in the chair.  Sats 94% at end of session, but pt very fatigued. Further mobility deferred.  Ambulation/Gait             General Gait Details: Deferred   Stairs            Wheelchair Mobility    Modified Rankin (Stroke Patients Only)       Balance Overall balance assessment: Needs assistance Sitting-balance support: Feet supported;Bilateral upper extremity supported Sitting balance-Leahy Scale: Poor Sitting balance - Comments: Assist for pt to perform and maintain anterior lean in chair so therapists could scoot hips back.                            Cognition Arousal/Alertness: Awake/alert Behavior During Therapy: WFL for tasks assessed/performed;Flat affect Overall Cognitive Status: Within Functional Limits for tasks assessed                      Exercises      General Comments        Pertinent Vitals/Pain      Home Living  Prior Function            PT Goals (current goals can now be found in the care plan section) Acute Rehab PT Goals Patient Stated Goal: To get stronger  PT Goal Formulation: With patient Time For Goal Achievement: 05/11/14 Potential to Achieve Goals: Fair Progress towards PT goals: Progressing toward goals    Frequency  Min 2X/week    PT Plan Current plan remains appropriate    Co-evaluation PT/OT/SLP Co-Evaluation/Treatment: Yes Reason for Co-Treatment: Complexity of the patient's impairments (multi-system involvement) PT goals addressed during session: Mobility/safety with mobility;Balance OT goals addressed during session: Strengthening/ROM     End of  Session Equipment Utilized During Treatment: Oxygen Activity Tolerance: Patient limited by fatigue Patient left: in chair;with call bell/phone within reach     Time: 8119-1478 PT Time Calculation (min): 25 min  Charges:  $Therapeutic Activity: 23-37 mins                    G CodesJolyn Lent May 27, 2014, 4:35 PM  Jolyn Lent, PT, DPT Acute Rehabilitation Services Pager: [phone]

## 2014-05-04 NOTE — Progress Notes (Signed)
Occupational Therapy Treatment Patient Details Name: Bryan Hubbard MRN: [id number] DOB: Jan 10, 1941 Today's Date: 05/04/2014    History of present illness This 73 y.o. 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 LE edema. Diagnosis:  CHF exacerbation; A-fib; orthostatic hypotension; NSTEMI.  He underwent cardioversion 04/06/2014 due to aflutter with RVR.  He has continued with chronic hypoxemia respiratory failure presumed due to PE, pulmonary edema and underlying parenchymal lung disease (Goodpature syndrome).  Chest CT 04/23/14 with questionable worsening Goodpasture's syndrome, but  he had normal antibody panel making Goodpastures unlikely. Underwent plasmapheresis x 2.  He has continued Hypoxemia and is currently (04/27/14) on High flow 02 and PT/OT were ordered to attempt EOB activity.     OT comments  Pt is making slow but steady improvements/progress.  He continues to fatigue quickly with small amount of activity requiring a significant amount of rest/recovery before being able to participate further.  He maintained better 02 sats throughout this session.   The lowest sat reading was 78% while sitting EOB, and recovered to 90% within 4 mins once he returned to supine.  Sats 85-88% with bed mobility and lift to recliner.   He denied pain   Follow Up Recommendations  LTACH    Equipment Recommendations  None recommended by OT    Recommendations for Other Services      Precautions / Restrictions Precautions Precautions: Fall Precaution Comments: high flow O2       Mobility Bed Mobility Overal bed mobility: Needs Assistance Bed Mobility: Rolling Rolling: Mod assist Sidelying to sit: Mod assist Supine to sit: Max assist     General bed mobility comments: Pt requires assist to roll Lt and Rt.  Assist to lift  trunk from bed and assist to lift LEs back on to bed when returning to supine   Transfers Overall transfer level: Needs assistance Equipment used: Ambulation equipment used (maxi move)             General transfer comment: Pt rolled Lt and Rt with mod A to place pad for maxi move.  and then very long rest break between each roll with sats down to 88%.  Pt then lifted to recliner with sats decreasing to 85%, but then recovering once he was in chair.  He required +2 max A to scoot hips back in the chair.  Sats 94% at end of session, but pt very fatigued    Balance Overall balance assessment: Needs assistance Sitting-balance support: Feet unsupported;Single extremity supported Sitting balance-Leahy Scale: Poor Sitting balance - Comments: Pt required min A to sit x 1.5 mins with sats dropping to 78% and pt complaint of fatigue.  He was assisted with return to supine.                             ADL                                       Functional mobility during ADLs: Total assistance (requires lift equipment) General ADL Comments: Pt sat EOB with min A x 1.5 mins with sats dropping to 79%, but recovered to 90% on within 4 mins once he returned to supine.  Pt performed  bed mobility then transferred to chair via maxi move.  Sats at lowest 85% with quick recovery.  Pt requires significant amount of time between each movement/activity to rest/recover energy before attempting another activity       Vision                     Perception     Praxis      Cognition   Behavior During Therapy: [facility] for tasks assessed/performed;Flat affect Overall Cognitive Status: Within Functional Limits for tasks assessed                       Extremity/Trunk Assessment               Exercises     Shoulder Instructions       General Comments      Pertinent Vitals/ Pain          Home Living                                           Prior Functioning/Environment              Frequency Min 3X/week     Progress Toward Goals  OT Goals(current goals can now be found in the care plan section)  Progress towards OT goals: Progressing toward goals  ADL Goals Pt Will Perform Eating: with modified independence;sitting Pt Will Perform Grooming: with set-up;sitting Additional ADL Goal #1: Pt will be able to tolerate EOB sitting with min guard assist x  5 mins with 02 sats at least 90% Additional ADL Goal #2: Pt will be able to perform bil. UE exercises bed level, maintaining sats >90%  Plan Discharge plan remains appropriate    Co-evaluation    PT/OT/SLP Co-Evaluation/Treatment: Yes Reason for Co-Treatment: Complexity of the patient's impairments (multi-system involvement)   OT goals addressed during session: Strengthening/ROM      End of Session Equipment Utilized During Treatment: Oxygen (high flow 02 40L; 60%)   Activity Tolerance Patient limited by fatigue   Patient Left in chair;with call bell/phone within reach   Nurse Communication Mobility status        Time: 1455-1557 OT Time Calculation (min): 62 min  Charges: OT General Charges $OT Visit: 1 Procedure OT Treatments $Therapeutic Activity: 23-37 mins  Bryan Hubbard M 05/04/2014, 4:19 PM

## 2014-05-04 NOTE — Progress Notes (Signed)
[facility] TEAM 1 - Stepdown/ICU TEAM Progress Note  Bryan Hubbard:[id number] DOB: [birth year] DOA: 04/19/2014 PCP: [name], MD  Admit HPI / Brief Narrative: 73 yo M w/ Hx chronic atrial flutter, DVT, Goodpasture's, and recent oxygen dependent respiratory failure after hospitalization for community acquired pneumonia last month who presented to the hospital with progressive shortness of breath and lower extremity edema. In addition he was becoming very dizzy and very weak with standing. He notified his cardiologist of these symptoms several days prior to presentation and was instructed to decrease his metoprolol to 25 mg by mouth twice a day. Unfortunately his symptoms did not improve.   He presented to the hospital where it was noted that his systolic blood pressure was in the upper 80s and upon standing dropped into the 70s. Review of patient medication revealed he was taking Lasix 20 mg daily and had continued to take this medication prior to presentation. In addition he endorsed having to increase his oxygen at home from 2 L to 4 L because of progressive shortness of breath. A two-view chest x-ray demonstrated acutely worsened diffuse bilateral airspace opacification with a peripheral distribution and diffuse increased interstitial markings that could either be acute infectious or inflammatory interstitial pneumonitis.   HPI/Subjective: Alert and no new symptoms  Assessment/Plan: Acute on chronic respiratory failure with hypoxia:  -Patient continues to require high flow nasal cannula in order to maintain SpO2 greater than 92% while resting-no improvement in sx's Dr. [place] Bells d/w pt and wife the role of mechanical ventilation and that if he further deteriorates that he would NOT benefit from mechanical ventilation and in fact mechanical ventilation would likely hurt hime- pt now DNR but still pursuing aggressive care -continue mobilization efforts -Pulmonary expects prolonged  hospitalization and once more stable from a renal standpoint should consider LTAC  Atrial flutter with rapid ventricular response-maintaining NSR  -back in AF with rates in low 90s to 110s -Cardiology has signed off but if rates increase will need to reconsult -Initially failed attempts to use cardizem, BB, or both to control RVR  -Ibutilide infusion was unsuccessful > DCCV successful under bedside anesthesia and until the afternoon of 8/5 was maintaining NSR   -since had prolonged RVR we checked an ECHO which demonstrated a decrease in systolic function from 62-83% to 40-45%  -Eliquis had been held  initially due to expected issues with lower clotting factors while on plasmapheresis but also held to ensure no further melena -given worsened CrCl have opted to dc Eliquis for now in favor of IV Heparin  Hematuria (recurrent)  -pt states this is a recurring problem  -overnight redeveloped red blood in urine- follow  Glomerulonephritis /Acute kidney Injury on Chronic kidney disease stage 3, GFR 30-59 ml/min  -Nephrology reconsulted and suspected may have volume overload so high dose Lasix initiated 8/2 and thus far in a negative 7913 cc balance -renal function now improving with aggressive diuresis -pedal edema also decreasing  Goodpasture's  -chronically treated with steroid, oral cytoxan and prn plasmapheresis  -per Pulmonary chest x-ray was consistent with interstitial lung disease of uncertain etiology - due to current respiratory compromise unable to proceed with biopsy. -Patient subsequently underwent plasmapheresis catheter insertion 7/28 by IR and began plasmapheresis (see below) but serologies were negative for Goodpastures etiology so plasmapheresis dcd 7/30 and catheter discontinued 7/31 per IR  -Pulmonary symptoms ? secondary to Cytoxan toxicity; PCCM. Stopped Cytoxan  -High dose Solu-Medrol has been tapered to a much lower q 12 hr dosing  schedule  Acute systolic CHF  -reoslved     ?? Opportunistic infection  -Initially attempted to gently diurese but unfortunately only achieved worsened renal function without any improvement in respiratory status  -High resolution CT Chest 7/27 ordered by pulmonary with findings c/w Goodpastures and plasmapheresis initiated 7/28 and as of 7/29 respiratory sx's appear to stabilized  -7/30 pulmonary adjusted to very high dose steroids- pt declined bronchoscopy   -Pulmonary also concerned over cytoxan mediated fibrosis so Cytoxan was dc'd (7/30); Solu-Medrol per pulmonology  -Bactrim DC'd by PCCM on 8/2  Oral candida -began Nystatin s/s 8/4  Transient melena  -Occurred 7/30 and has not recurred so anticoagulation resumed 8/3  Hypoxemic mediated anxiety  -Klonopin has helped significantly with sx's   Abnormal UA  -cx positive only for 20,000 colonies of coag neg staph - further tx not currently indicated   Anemia of chronic disease  -Continue to hold stable - baseline between 7.9 and 9   Thrombocytopenia  - resolved  HYPERLIPIDEMIA   HYPERTENSION  -BP controlled  History of DVT / PE / IVC filter  -was transitioned from warfarin to eliquis during the last hospitalization at recommendation of the Pulmonologist in setting of inability to maintain therapeutic INR on warfarin. -Eliquis dc'd in favor of Heparin due to renal function  Hyperkalemia -Most likely caused by addition of Bactrim to treat empirically PCP; PCCM. DC'd on 8/2 -Resolving, will DC Kayexalate  Code Status: FULL Family Communication: Wife at bedside Disposition Plan: SDU until renal fnx stable then consider LTAC  Consultants: Dr. Saralyn Pilar Wright/Dr. Roselie Awkward (PCCM.) Dr. Edrick Oh (nephrology)  Procedure/Significant Events: 7/29 echocardiogram - Left ventricle: mild LVH. LVEF= 40% to 45%.  - Mitral valve:  mild regurgitation. - Tricuspid valve:  moderate regurgitation.  Culture Urine culture coag neg staph  DVT  prophylaxis: heparin  Objective: VITAL SIGNS: Temp: 97.6 F (36.4 C) (08/07 1241) Temp src: Oral (08/07 1241) BP: 108/79 mmHg (08/07 1241) Pulse Rate: 95 (08/07 0600) SPO2; 94% on high flow nasal cannula, 40 L O2 FIO2: 65%   Intake/Output Summary (Last 24 hours) at 05/04/14 1409 Last data filed at 05/04/14 1400  Gross per 24 hour  Intake 602.32 ml  Output   1975 ml  Net -1372.68 ml   Exam: General: No acute respiratory distress Lungs: CTA without crackles or wheezes, HFNC oxygen Cardiovascular: Regular rate and rhythm without murmur gallop or rub normal S1 and S2-peripheral edema below knees 1+ bilaterally primarily at ankles Abdomen: Nontender, nondistended, soft, bowel sounds positive, no rebound, no ascites, no appreciable mass GU: cherry red urine without clots Extremities: No significant cyanosis, clubbing, or edema bilateral lower extremities  Data Reviewed: Basic Metabolic Panel:  Recent Labs Lab 04/29/14 0750  04/30/14 0636 05/01/14 0240 05/02/14 0443 05/03/14 0346 05/04/14 0250  NA 133*  --  135* 131* 135* 138 138  K 5.3  < > 4.5 3.9 3.6* 2.8* 3.6*  CL 87*  --  89* 85* 87* 87* 88*  CO2 26  --  24 22 24 28 31   GLUCOSE 112*  --  127* 160* 174* 154* 112*  BUN 130*  --  140* 147* 149* 153* 148*  CREATININE 3.06*  --  3.19* 3.33* 3.19* 2.93* 2.76*  CALCIUM 9.0  --  8.6 8.7 8.9 9.0 9.3  MG 2.7*  --  2.7*  --   --   --   --   PHOS  --   --  8.9* 9.1* 8.5* 6.8* 5.8*  < > =  values in this interval not displayed.  Liver Function Tests:  Recent Labs Lab 04/29/14 0750 05/01/14 0240 05/02/14 0443 05/03/14 0346 05/04/14 0250  AST 28  --   --   --   --   ALT 20  --   --   --   --   ALKPHOS 41  --   --   --   --   BILITOT 0.5  --   --   --   --   PROT 5.1*  --   --   --   --   ALBUMIN 3.7 3.8 3.7 3.7 3.7   CBC:  Recent Labs Lab 04/29/14 0750 04/30/14 0636 05/01/14 0240 05/02/14 0443 05/03/14 0346 05/04/14 0250  WBC 8.4 7.0 8.8 9.3 11.1* 11.3*   NEUTROABS 7.8*  --   --   --   --   --   HGB 8.1* 8.0* 8.4* 9.4* 9.4* 9.8*  HCT 24.0* 23.3* 24.7* 27.6* 27.3* 29.0*  MCV 94.9 94.3 95.0 96.5 95.1 97.3  PLT 156 133* 150 172 174 186    Studies:  Recent x-ray studies have been reviewed in detail by the Attending Physician  Scheduled Meds:  Scheduled Meds: . antiseptic oral rinse  15 mL Mouth Rinse BID  . calcium acetate  667 mg Oral TID WC  . clonazePAM  0.25 mg Oral BID  . [START ON 05/10/2014] cyanocobalamin  1,000 mcg Subcutaneous Q30 days  . cyclobenzaprine  5 mg Oral QHS  . darbepoetin (ARANESP) injection - NON-DIALYSIS  60 mcg Subcutaneous Q Wed-1800  . tamsulosin  0.4 mg Oral Daily   And  . dutasteride  0.5 mg Oral Daily  . feeding supplement (NEPRO CARB STEADY)  237 mL Oral BID BM  . furosemide  160 mg Intravenous BID  . latanoprost  1 drop Both Eyes q morning - 10a  . levalbuterol  1.25 mg Nebulization TID  . metoprolol tartrate  25 mg Oral 4 times per day  . nystatin  5 mL Oral QID  . pantoprazole  40 mg Oral Q1200  . predniSONE  40 mg Oral Q breakfast  . traZODone  100 mg Oral QHS    Time spent on care of this patient: 25 mins   ELLIS,ALLISON L. , ANP   [facility] Office  [phone] Pager - [phone]  On-Call/Text Page:      [email or web address]      password [email or web address]  If 7PM-7AM, please contact night-coverage [email or web address] Password [email or web address] 05/04/2014, 2:09 PM   LOS: 17 days   I have personally examined this patient and reviewed the entire database. I have reviewed the above note, made any necessary editorial changes, and agree with its content.  Cherene Altes, MD [facility]

## 2014-05-05 LAB — BASIC METABOLIC PANEL
Anion gap: 19 — ABNORMAL HIGH (ref 5–15)
BUN: 145 mg/dL — ABNORMAL HIGH (ref 6–23)
CALCIUM: 9.3 mg/dL (ref 8.4–10.5)
CO2: 29 meq/L (ref 19–32)
Chloride: 88 mEq/L — ABNORMAL LOW (ref 96–112)
Creatinine, Ser: 2.55 mg/dL — ABNORMAL HIGH (ref 0.50–1.35)
GFR calc Af Amer: 27 mL/min — ABNORMAL LOW (ref >90)
GFR calc non Af Amer: 23 mL/min — ABNORMAL LOW (ref >90)
GLUCOSE: 114 mg/dL — AB (ref 70–99)
Potassium: 3.6 mEq/L — ABNORMAL LOW (ref 3.7–5.3)
SODIUM: 136 meq/L — AB (ref 137–147)

## 2014-05-05 LAB — CBC
HEMATOCRIT: 28 % — AB (ref 39.0–52.0)
Hemoglobin: 9.4 g/dL — ABNORMAL LOW (ref 13.0–17.0)
MCH: 32.6 pg (ref 26.0–34.0)
MCHC: 33.6 g/dL (ref 30.0–36.0)
MCV: 97.2 fL (ref 78.0–100.0)
Platelets: 190 10*3/uL (ref 150–400)
RBC: 2.88 MIL/uL — ABNORMAL LOW (ref 4.22–5.81)
RDW: 21.1 % — ABNORMAL HIGH (ref 11.5–15.5)
WBC: 10.2 10*3/uL (ref 4.0–10.5)

## 2014-05-05 LAB — MAGNESIUM: MAGNESIUM: 2.5 mg/dL (ref 1.5–2.5)

## 2014-05-05 LAB — APTT: aPTT: 78 seconds — ABNORMAL HIGH (ref 24–37)

## 2014-05-05 LAB — HEPARIN LEVEL (UNFRACTIONATED): Heparin Unfractionated: 1.32 IU/mL — ABNORMAL HIGH (ref 0.30–0.70)

## 2014-05-05 LAB — PHOSPHORUS: PHOSPHORUS: 5.9 mg/dL — AB (ref 2.3–4.6)

## 2014-05-05 NOTE — Progress Notes (Addendum)
ANTICOAGULATION CONSULT NOTE - Follow Up Consult  Pharmacy Consult for Heparin  Indication: atrial fibrillation  No Known Allergies  Patient Measurements: Height: 5\' 10"  (177.8 cm) Weight: 175 lb (79.379 kg) IBW/kg (Calculated) : 73  Vital Signs: Temp: 97.4 F (36.3 C) (08/08 1139) Temp src: Oral (08/08 1139) BP: 95/64 mmHg (08/08 1235) Pulse Rate: 100 (08/08 1235)  Labs:  Recent Labs  05/03/14 0346 05/04/14 0250 05/05/14 0300  HGB 9.4* 9.8* 9.4*  HCT 27.3* 29.0* 28.0*  PLT 174 186 190  APTT 95* 96* 78*  HEPARINUNFRC 1.75* 1.68* 1.32*  CREATININE 2.93* 2.76* 2.55*    Estimated Creatinine Clearance: 26.2 ml/min (by C-G formula based on Cr of 2.55).  Medications:  Heparin 1050 units/hr  Assessment: 73 y/o M with h/o DVT/PE and aflutter on heparin while apixaban on hold in setting of acute on chronic renal failure. Heparin level remains elevated at 1.32 d/t recent apixaban(last dose 8/4) but is trending down. Aptt is at goal at 78 after slight rate decrease. Hematuria continues but patient states this is a recurring problem and since CBC stable, will continue to follow closely. Renal function also seems to be slowly improving.   Goal of Therapy:  Heparin level 0.3-0.7 units/ml aPTT 66-102 seconds Monitor platelets by anticoagulation protocol: Yes   Plan:  - Continue heparin gtt at 1050 u/hr - Daily heparin level and aPTT - Monitor CBC and continued hematuria closely  Harolyn Rutherford, PharmD Clinical Pharmacist - Resident Pager: [phone] Pharmacy: [phone] 05/05/2014 1:39 PM

## 2014-05-05 NOTE — Progress Notes (Signed)
TEAM 1 - Stepdown/ICU TEAM Progress Note  Bryan Hubbard:[id number] DOB: 10-20-1940 DOA: 04/16/2014 PCP: [name], MD  Admit HPI / Brief Narrative: 73 yo M w/ Hx chronic atrial flutter, DVT, Goodpasture's, and recent oxygen dependent respiratory failure after hospitalization for community acquired pneumonia who presented to the hospital with progressive shortness of breath and lower extremity edema. In addition he was becoming very dizzy and very weak with standing. He notified his Cardiologist of these symptoms several days prior to presentation and was instructed to decrease his metoprolol to 25 mg by mouth twice a day. Unfortunately his symptoms did not improve.   He presented to the hospital where it was noted that his systolic blood pressure was in the upper 80s and upon standing dropped into the 70s. Review of patient medication revealed he was taking Lasix 20 mg daily. In addition he endorsed having to increase his oxygen at home from 2 L to 4 L because of progressive shortness of breath. A two-view chest x-ray demonstrated acutely worsened diffuse bilateral airspace opacification and diffuse increased interstitial markings.   HPI/Subjective: Flat affect, but denies new complaints.  Denies cp, n/v, or abdom pain.  SOB has not changed.    Assessment/Plan:  Acute on chronic respiratory failure with hypoxia -Patient continues to require high flow nasal cannula  -pt now DNR but still desires aggressive care otherwise  -Pulmonary expects prolonged hospitalization and once more stable from a renal standpoint will consider LTAC  Atrial flutter with rapid ventricular response -back in AF as of ~8/5 PM with rates in low 90s to 110s -Cardiology has signed off - if rates increase will need to reconsult -Initially failed attempts to use cardizem, BB, or both to control RVR  -Ibutilide infusion was unsuccessful > DCCV successful under bedside anesthesia and until the afternoon  of 8/5 was maintaining NSR   -ECHO demonstrated a decrease in systolic function from 14-97% to 40-45%  -anticoag via heparin at this time   Hematuria (recurrent)  -pt states this is a recurring problem - persists, but Hgb is stable so follow only for now   Glomerulonephritis / Acute kidney Injury on Chronic kidney disease stage 3, GFR 30-59 ml/min  -Nephrology reconsulted - high dose Lasix re-initiated 8/2  -renal function slowly improving with aggressive diuresis  Goodpasture's  -chronically treated with steroid, oral cytoxan  -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 Cytoxan toxicity > PCCM stopped Cytoxan  -began plasmapheresis but serologies were negative for Goodpastures etiology so plasmapheresis dcd 7/30  -High dose Solu-Medrol being tapered  Acute systolic CHF  -resolved   Oral candida -began Nystatin 8/4  Transient melena  -Occurred 7/30 and has not recurred so anticoagulation resumed 8/3  Hypoxemic mediated anxiety  -Klonopin    Abnormal UA  -cx positive only for 20,000 colonies of coag neg staph - further tx not currently indicated   Anemia of chronic disease  -Hgb stable - baseline between 7.9 and 9   Thrombocytopenia  -resolved  HYPERLIPIDEMIA   HYPERTENSION  -BP controlled  History of DVT / PE / IVC filter  -was transitioned from warfarin to eliquis during the last hospitalization at recommendation of the Pulmonologist in setting of inability to maintain therapeutic INR on warfarin -Eliquis dc'd in favor of Heparin due to renal function and GIB  Code Status: FULL Family Communication: no family present at time of exam today  Disposition Plan: SDU until renal fnx stable then consider LTAC  Consultants: Dr. Saralyn Pilar Wright/Dr. Roselie Awkward (PCCM) Dr. Edrick Oh (Nephrology)  Procedures: 7/29 echocardiogram - Left ventricle: mild LVH. LVEF= 40% to 45% - Mitral valve:  mild regurgitation - Tricuspid valve:  moderate regurgitation  DVT prophylaxis: heparin  Objective: Blood pressure 95/64, pulse 100, temperature 97.4 F (36.3 C), temperature source Oral, resp. rate 14, height 5\' 10"  (1.778 m), weight 79.379 kg (175 lb), SpO2 95.00%.  Intake/Output Summary (Last 24 hours) at 05/05/14 1102 Last data filed at 05/05/14 0841  Gross per 24 hour  Intake  712.5 ml  Output   2275 ml  Net -1562.5 ml   Exam: General: No acute respiratory distress Lungs: faint diffuse crackles - no wheeze  Cardiovascular: irreg irreg - rate controlled - no appreciable M Abdomen: Nontender, nondistended, soft, bowel sounds positive, no rebound, no ascites, no appreciable mass GU: cherry red urine without clots Extremities: No significant cyanosis, clubbing;  1+ edema bilateral lower extremities  Data Reviewed: Basic Metabolic Panel:  Recent Labs Lab 04/29/14 0750  04/30/14 0636 05/01/14 0240 05/02/14 0443 05/03/14 0346 05/04/14 0250 05/05/14 0300  NA 133*  --  135* 131* 135* 138 138 136*  K 5.3  < > 4.5 3.9 3.6* 2.8* 3.6* 3.6*  CL 87*  --  89* 85* 87* 87* 88* 88*  CO2 26  --  24 22 24 28 31 29   GLUCOSE 112*  --  127* 160* 174* 154* 112* 114*  BUN 130*  --  140* 147* 149* 153* 148* 145*  CREATININE 3.06*  --  3.19* 3.33* 3.19* 2.93* 2.76* 2.55*  CALCIUM 9.0  --  8.6 8.7 8.9 9.0 9.3 9.3  MG 2.7*  --  2.7*  --   --   --   --  2.5  PHOS  --   < > 8.9* 9.1* 8.5* 6.8* 5.8* 5.9*  < > = values in this interval not displayed.  Liver Function Tests:  Recent Labs Lab 04/29/14 0750 05/01/14 0240 05/02/14 0443 05/03/14 0346 05/04/14 0250  AST 28  --   --   --   --   ALT 20  --   --   --   --   ALKPHOS 41  --   --   --   --   BILITOT 0.5  --   --   --   --   PROT 5.1*  --   --   --   --   ALBUMIN 3.7 3.8 3.7 3.7 3.7   CBC:  Recent Labs Lab 04/29/14 0750  05/01/14 0240 05/02/14 0443 05/03/14 0346 05/04/14 0250 05/05/14 0300  WBC 8.4  < > 8.8 9.3 11.1* 11.3* 10.2  NEUTROABS 7.8*  --   --    --   --   --   --   HGB 8.1*  < > 8.4* 9.4* 9.4* 9.8* 9.4*  HCT 24.0*  < > 24.7* 27.6* 27.3* 29.0* 28.0*  MCV 94.9  < > 95.0 96.5 95.1 97.3 97.2  PLT 156  < > 150 172 174 186 190  < > = values in this interval not displayed.  Studies:  Recent x-ray studies have been reviewed in detail by the Attending Physician  Scheduled Meds:  Scheduled Meds: . antiseptic oral rinse  15 mL Mouth Rinse BID  . calcium acetate  667 mg Oral TID WC  . clonazePAM  0.25 mg Oral BID  . [START ON 05/10/2014] cyanocobalamin  1,000 mcg Subcutaneous Q30 days  . cyclobenzaprine  5  mg Oral QHS  . darbepoetin (ARANESP) injection - NON-DIALYSIS  60 mcg Subcutaneous Q Wed-1800  . tamsulosin  0.4 mg Oral Daily   And  . dutasteride  0.5 mg Oral Daily  . feeding supplement (NEPRO CARB STEADY)  237 mL Oral BID BM  . furosemide  160 mg Intravenous BID  . latanoprost  1 drop Both Eyes q morning - 10a  . levalbuterol  1.25 mg Nebulization TID  . metoprolol tartrate  25 mg Oral 4 times per day  . nystatin  5 mL Oral QID  . pantoprazole  40 mg Oral Q1200  . predniSONE  40 mg Oral Q breakfast  . traZODone  100 mg Oral QHS    Time spent on care of this patient: 25 mins  Cherene Altes, MD [facility] For Consults/Admissions - Flow Manager - [phone] Office  [phone] Pager [phone]  On-Call/Text Page:      [email or web address]      password [facility]  05/05/2014, 11:02 AM   LOS: 18 days

## 2014-05-05 NOTE — Progress Notes (Signed)
Patient has verbalized wanting to die and has the belief that " I am dying". Patient is very withdrawn and becomes angry with wife who is at bedside.  Quoted as saying " Im not going be here tomorrow", " I want to die" and " I can't do this anymore" . Encouragement and active listening provided. Discussed possible goals that would help patient to see his condition as improving. Patient verbalized that " I cannot see anything, Im done". Provide patient and patient's wife with goals of care for tonight and offered to obtain the chaplin. At present time , they have refused. Offered aromatherapy to them, used lavender infused guaze was placed in the room and massaged the patient's feet with lavender infused lotion.  Patient verbalized "Thank you" for the massage. Medications given - see MAR. Will continue to provide encouragement.

## 2014-05-05 NOTE — Progress Notes (Signed)
S:   + nausea  Appetite remains poor O:BP 95/72  Pulse 92  Temp(Src) 97.7 F (36.5 C) (Oral)  Resp 11  Ht 5\' 10"  (1.778 m)  Wt 79.379 kg (175 lb)  BMI 25.11 kg/m2  SpO2 94%  Intake/Output Summary (Last 24 hours) at 05/05/14 0723 Last data filed at 05/05/14 0600  Gross per 24 hour  Intake 736.82 ml  Output   2200 ml  Net -1463.18 ml   Weight change: -1.62 kg (-3 lb 9.2 oz) PYP:PJKDT and alert OIZ:TIWPY, irreg Resp: Bil crackles are less Abd:+ BS NTND Ext: 1-2+ edema NEURO:CNI Ox3 no asterixis   . antiseptic oral rinse  15 mL Mouth Rinse BID  . calcium acetate  667 mg Oral TID WC  . clonazePAM  0.25 mg Oral BID  . [START ON 05/10/2014] cyanocobalamin  1,000 mcg Subcutaneous Q30 days  . cyclobenzaprine  5 mg Oral QHS  . darbepoetin (ARANESP) injection - NON-DIALYSIS  60 mcg Subcutaneous Q Wed-1800  . tamsulosin  0.4 mg Oral Daily   And  . dutasteride  0.5 mg Oral Daily  . feeding supplement (NEPRO CARB STEADY)  237 mL Oral BID BM  . furosemide  160 mg Intravenous BID  . latanoprost  1 drop Both Eyes q morning - 10a  . levalbuterol  1.25 mg Nebulization TID  . metoprolol tartrate  25 mg Oral 4 times per day  . nystatin  5 mL Oral QID  . pantoprazole  40 mg Oral Q1200  . predniSONE  40 mg Oral Q breakfast  . traZODone  100 mg Oral QHS   No results found. BMET    Component Value Date/Time   NA 136* 05/05/2014 0300   K 3.6* 05/05/2014 0300   CL 88* 05/05/2014 0300   CO2 29 05/05/2014 0300   GLUCOSE 114* 05/05/2014 0300   BUN 145* 05/05/2014 0300   CREATININE 2.55* 05/05/2014 0300   CALCIUM 9.3 05/05/2014 0300   GFRNONAA 23* 05/05/2014 0300   GFRAA 27* 05/05/2014 0300   CBC    Component Value Date/Time   WBC 10.2 05/05/2014 0300   WBC 10.4* 12/29/2011 1003   RBC 2.88* 05/05/2014 0300   RBC 2.70* 01/20/2014 0913   RBC 4.05* 12/29/2011 1003   HGB 9.4* 05/05/2014 0300   HGB 12.9* 12/29/2011 1003   HCT 28.0* 05/05/2014 0300   HCT 38.5 12/29/2011 1003   PLT 190 05/05/2014 0300   PLT 272 12/29/2011  1003   MCV 97.2 05/05/2014 0300   MCV 94.9 12/29/2011 1003   MCH 32.6 05/05/2014 0300   MCH 31.8 12/29/2011 1003   MCHC 33.6 05/05/2014 0300   MCHC 33.5 12/29/2011 1003   RDW 21.1* 05/05/2014 0300   RDW 14.2 12/29/2011 1003   LYMPHSABS 0.5* 04/29/2014 0750   LYMPHSABS 1.5 12/29/2011 1003   MONOABS 0.2 04/29/2014 0750   MONOABS 1.4* 12/29/2011 1003   EOSABS 0.0 04/29/2014 0750   EOSABS 0.3 12/29/2011 1003   BASOSABS 0.0 04/29/2014 0750   BASOSABS 0.0 12/29/2011 1003     Assessment: 1. 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 Scr cont to trend down and UO good.  BUN slowly trending down 2. Cardiomyopathy, EF 40% 3. Vol overload, improving 4. Anemia on aranesp  5. Hx PE sp IVC filter 6. A fib Plan: 1. Cont lasix 2.  Recheck labs in AM     Bryan Hubbard T

## 2014-05-06 LAB — CBC
HCT: 28.9 % — ABNORMAL LOW (ref 39.0–52.0)
Hemoglobin: 9.6 g/dL — ABNORMAL LOW (ref 13.0–17.0)
MCH: 33.1 pg (ref 26.0–34.0)
MCHC: 33.2 g/dL (ref 30.0–36.0)
MCV: 99.7 fL (ref 78.0–100.0)
Platelets: 165 10*3/uL (ref 150–400)
RBC: 2.9 MIL/uL — AB (ref 4.22–5.81)
RDW: 21 % — ABNORMAL HIGH (ref 11.5–15.5)
WBC: 9.6 10*3/uL (ref 4.0–10.5)

## 2014-05-06 LAB — RENAL FUNCTION PANEL
ANION GAP: 17 — AB (ref 5–15)
Albumin: 3.5 g/dL (ref 3.5–5.2)
BUN: 144 mg/dL — ABNORMAL HIGH (ref 6–23)
CHLORIDE: 83 meq/L — AB (ref 96–112)
CO2: 32 mEq/L (ref 19–32)
Calcium: 9 mg/dL (ref 8.4–10.5)
Creatinine, Ser: 2.57 mg/dL — ABNORMAL HIGH (ref 0.50–1.35)
GFR calc Af Amer: 27 mL/min — ABNORMAL LOW (ref >90)
GFR, EST NON AFRICAN AMERICAN: 23 mL/min — AB (ref >90)
Glucose, Bld: 111 mg/dL — ABNORMAL HIGH (ref 70–99)
POTASSIUM: 4 meq/L (ref 3.7–5.3)
Phosphorus: 6.3 mg/dL — ABNORMAL HIGH (ref 2.3–4.6)
Sodium: 132 mEq/L — ABNORMAL LOW (ref 137–147)

## 2014-05-06 LAB — HEPARIN LEVEL (UNFRACTIONATED): HEPARIN UNFRACTIONATED: 1.32 [IU]/mL — AB (ref 0.30–0.70)

## 2014-05-06 LAB — APTT: aPTT: 81 seconds — ABNORMAL HIGH (ref 24–37)

## 2014-05-06 MED ORDER — FUROSEMIDE 10 MG/ML IJ SOLN
80.0000 mg | Freq: Two times a day (BID) | INTRAMUSCULAR | Status: DC
  Administered 2014-05-06 – 2014-05-07 (×2): 80 mg via INTRAVENOUS
  Filled 2014-05-06 (×5): qty 8

## 2014-05-06 NOTE — Progress Notes (Signed)
[facility] TEAM 1 - Stepdown/ICU TEAM Progress Note  Bryan Hubbard NOB:[id number] DOB: 10-25-1940 DOA: 04/22/2014 PCP: [name], MD  Admit HPI / Brief Narrative: 73 yo M w/ Hx chronic atrial flutter, DVT, Goodpasture's, and recent oxygen dependent respiratory failure after hospitalization for community acquired pneumonia who presented to the hospital with progressive shortness of breath and lower extremity edema. In addition he was becoming very dizzy and very weak with standing. He notified his Cardiologist of these symptoms several days prior to presentation and was instructed to decrease his metoprolol to 25 mg by mouth twice a day. Unfortunately his symptoms did not improve.   He presented to the hospital where it was noted that his systolic blood pressure was in the upper 80s and upon standing dropped into the 70s. Review of patient medication revealed he was taking Lasix 20 mg daily. In addition he endorsed having to increase his oxygen at home from 2 L to 4 L because of progressive shortness of breath. A two-view chest x-ray demonstrated acutely worsened diffuse bilateral airspace opacification and diffuse increased interstitial markings.   HPI/Subjective: No significant change in status over night.     Assessment/Plan:  Acute on chronic respiratory failure with hypoxia -Patient continues to require high flow nasal cannula  -pt now DNR but desires aggressive care otherwise  -Pulmonary expects prolonged hospitalization and once more stable from a renal standpoint will consider LTAC (possibly this week - family report would only be interested in [facility])  Atrial flutter with rapid ventricular response -back in AF as of ~8/5 PM with rates in low 90s to 110s -Cardiology has signed off - if rates increase will need to reconsult -Initially failed attempts to use cardizem, BB, or both to control RVR  -Ibutilide infusion was unsuccessful > DCCV successful under bedside anesthesia and  until the afternoon of 8/5 was maintaining NSR   -ECHO demonstrated a decrease in systolic function from 94-76% to 40-45%  -anticoag via heparin at this time   Hematuria (recurrent)  -pt states this is a recurring problem - persists, but Hgb is stable so follow only for now   Glomerulonephritis / Acute kidney Injury on Chronic kidney disease stage 3, GFR 30-59 ml/min  -Nephrology reconsulted - high dose Lasix re-initiated 8/2  -renal function slowly improving with aggressive diuresis  Goodpasture's  -chronically treated with steroid, oral cytoxan  -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 Cytoxan toxicity > PCCM stopped Cytoxan  -began plasmapheresis but serologies were negative for Goodpastures etiology so plasmapheresis dcd 7/30  -High dose Solu-Medrol being tapered  Acute systolic CHF  -resolved   Oral candida -began Nystatin 8/4 - plan to stop after 10 days of tx   Transient melena  -Occurred 7/30 and has not recurred so anticoagulation resumed 8/3  Hypoxemic mediated anxiety  -Klonopin    Abnormal UA  -cx positive only for 20,000 colonies of coag neg staph - further tx not currently indicated   Anemia of chronic disease  -Hgb stable - baseline between 7.9 and 9   Thrombocytopenia  -resolved  HYPERLIPIDEMIA   HYPERTENSION  -BP controlled  History of DVT / PE / IVC filter  -was transitioned from warfarin to eliquis during the last hospitalization at recommendation of the Pulmonologist in setting of inability to maintain therapeutic INR on warfarin -Eliquis dc'd in favor of Heparin due to renal function and GIB  Code Status: FULL Family Communication: spoke w/ wife at bedside   Disposition Plan:  SDU until renal fnx stable then consider LTAC (hopefully this week)  Consultants: Dr. Saralyn Pilar Wright/Dr. Roselie Awkward (PCCM) Dr. Edrick Oh (Nephrology)  Procedures: 7/29 echocardiogram - Left ventricle: mild LVH. LVEF= 40% to  45% - Mitral valve:  mild regurgitation - Tricuspid valve: moderate regurgitation  DVT prophylaxis: heparin  Objective: Blood pressure 106/71, pulse 105, temperature 97.6 F (36.4 C), temperature source Oral, resp. rate 14, height 5\' 10"  (1.778 m), weight 79.379 kg (175 lb), SpO2 95.00%.  Intake/Output Summary (Last 24 hours) at 05/06/14 1013 Last data filed at 05/06/14 0700  Gross per 24 hour  Intake  319.5 ml  Output   2425 ml  Net -2105.5 ml   Exam: General: No acute respiratory distress Lungs: faint diffuse crackles - no wheeze  Cardiovascular: irreg irreg - rate controlled  Abdomen: Nontender, nondistended, soft, bowel sounds positive, no rebound, no ascites, no appreciable mass GU: cherry red urine without clots Extremities: No significant cyanosis, clubbing;  1+ edema bilateral lower extremities  Data Reviewed: Basic Metabolic Panel:  Recent Labs Lab 04/30/14 0636  05/02/14 0443 05/03/14 0346 05/04/14 0250 05/05/14 0300 05/06/14 0238  NA 135*  < > 135* 138 138 136* 132*  K 4.5  < > 3.6* 2.8* 3.6* 3.6* 4.0  CL 89*  < > 87* 87* 88* 88* 83*  CO2 24  < > 24 28 31 29  32  GLUCOSE 127*  < > 174* 154* 112* 114* 111*  BUN 140*  < > 149* 153* 148* 145* 144*  CREATININE 3.19*  < > 3.19* 2.93* 2.76* 2.55* 2.57*  CALCIUM 8.6  < > 8.9 9.0 9.3 9.3 9.0  MG 2.7*  --   --   --   --  2.5  --   PHOS 8.9*  < > 8.5* 6.8* 5.8* 5.9* 6.3*  < > = values in this interval not displayed.  Liver Function Tests:  Recent Labs Lab 05/01/14 0240 05/02/14 0443 05/03/14 0346 05/04/14 0250 05/06/14 0238  ALBUMIN 3.8 3.7 3.7 3.7 3.5   CBC:  Recent Labs Lab 05/02/14 0443 05/03/14 0346 05/04/14 0250 05/05/14 0300 05/06/14 0255  WBC 9.3 11.1* 11.3* 10.2 9.6  HGB 9.4* 9.4* 9.8* 9.4* 9.6*  HCT 27.6* 27.3* 29.0* 28.0* 28.9*  MCV 96.5 95.1 97.3 97.2 99.7  PLT 172 174 186 190 165   Studies:  Recent x-ray studies have been reviewed in detail by the Attending Physician  Scheduled  Meds:  Scheduled Meds: . antiseptic oral rinse  15 mL Mouth Rinse BID  . calcium acetate  667 mg Oral TID WC  . clonazePAM  0.25 mg Oral BID  . [START ON 05/10/2014] cyanocobalamin  1,000 mcg Subcutaneous Q30 days  . cyclobenzaprine  5 mg Oral QHS  . darbepoetin (ARANESP) injection - NON-DIALYSIS  60 mcg Subcutaneous Q Wed-1800  . tamsulosin  0.4 mg Oral Daily   And  . dutasteride  0.5 mg Oral Daily  . feeding supplement (NEPRO CARB STEADY)  237 mL Oral BID BM  . furosemide  80 mg Intravenous BID  . latanoprost  1 drop Both Eyes q morning - 10a  . levalbuterol  1.25 mg Nebulization TID  . metoprolol tartrate  25 mg Oral 4 times per day  . nystatin  5 mL Oral QID  . pantoprazole  40 mg Oral Q1200  . traZODone  100 mg Oral QHS    Time spent on care of this patient: 25 mins  Cherene Altes, MD [facility] For Consults/Admissions -  Flow Manager - [phone] Office  [phone] Pager [phone]  On-Call/Text Page:      [email or web address]      password [facility]  05/06/2014, 10:13 AM   LOS: 19 days

## 2014-05-06 NOTE — Progress Notes (Signed)
S:   + nausea  Appetite remains poor O:BP 106/71  Pulse 97  Temp(Src) 97.6 F (36.4 C) (Oral)  Resp 20  Ht 5\' 10"  (1.778 m)  Wt 79.379 kg (175 lb)  BMI 25.11 kg/m2  SpO2 96%  Intake/Output Summary (Last 24 hours) at 05/06/14 0835 Last data filed at 05/06/14 0700  Gross per 24 hour  Intake  340.5 ml  Output   2775 ml  Net -2434.5 ml   Weight change:  VHQ:IONGE and alert XBM:WUXLK, irreg Resp: Bil crackles are less Abd:+ BS NTND Ext: 2+ edema NEURO:CNI Ox3 mild asterixis   . antiseptic oral rinse  15 mL Mouth Rinse BID  . calcium acetate  667 mg Oral TID WC  . clonazePAM  0.25 mg Oral BID  . [START ON 05/10/2014] cyanocobalamin  1,000 mcg Subcutaneous Q30 days  . cyclobenzaprine  5 mg Oral QHS  . darbepoetin (ARANESP) injection - NON-DIALYSIS  60 mcg Subcutaneous Q Wed-1800  . tamsulosin  0.4 mg Oral Daily   And  . dutasteride  0.5 mg Oral Daily  . feeding supplement (NEPRO CARB STEADY)  237 mL Oral BID BM  . furosemide  160 mg Intravenous BID  . latanoprost  1 drop Both Eyes q morning - 10a  . levalbuterol  1.25 mg Nebulization TID  . metoprolol tartrate  25 mg Oral 4 times per day  . nystatin  5 mL Oral QID  . pantoprazole  40 mg Oral Q1200  . predniSONE  40 mg Oral Q breakfast  . traZODone  100 mg Oral QHS   No results found. BMET    Component Value Date/Time   NA 132* 05/06/2014 0238   K 4.0 05/06/2014 0238   CL 83* 05/06/2014 0238   CO2 32 05/06/2014 0238   GLUCOSE 111* 05/06/2014 0238   BUN 144* 05/06/2014 0238   CREATININE 2.57* 05/06/2014 0238   CALCIUM 9.0 05/06/2014 0238   GFRNONAA 23* 05/06/2014 0238   GFRAA 27* 05/06/2014 0238   CBC    Component Value Date/Time   WBC 9.6 05/06/2014 0255   WBC 10.4* 12/29/2011 1003   RBC 2.90* 05/06/2014 0255   RBC 2.70* 01/20/2014 0913   RBC 4.05* 12/29/2011 1003   HGB 9.6* 05/06/2014 0255   HGB 12.9* 12/29/2011 1003   HCT 28.9* 05/06/2014 0255   HCT 38.5 12/29/2011 1003   PLT 165 05/06/2014 0255   PLT 272 12/29/2011 1003   MCV 99.7  05/06/2014 0255   MCV 94.9 12/29/2011 1003   MCH 33.1 05/06/2014 0255   MCH 31.8 12/29/2011 1003   MCHC 33.2 05/06/2014 0255   MCHC 33.5 12/29/2011 1003   RDW 21.0* 05/06/2014 0255   RDW 14.2 12/29/2011 1003   LYMPHSABS 0.5* 04/29/2014 0750   LYMPHSABS 1.5 12/29/2011 1003   MONOABS 0.2 04/29/2014 0750   MONOABS 1.4* 12/29/2011 1003   EOSABS 0.0 04/29/2014 0750   EOSABS 0.3 12/29/2011 1003   BASOSABS 0.0 04/29/2014 0750   BASOSABS 0.0 12/29/2011 1003     Assessment: 1. 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 UO good, Scr the same.  Will decrease lasix dose 2. Cardiomyopathy, EF 40% 3. Vol overload, improving 4. Anemia on aranesp  5. Hx PE sp IVC filter 6. A fib Plan: 1. Decrease lasix to 80mg  BID 2.  Recheck labs in AM 3.  No doubt the high BUN is making him feel bad and I was hoping it would trend down more  than it has as steroids were tapered.  I discussed with him the role of HD, at least short term, to see if it would make him feel better. He will think about it and discuss with his brother in law who is a retired nephrologist.  Will back off the lasix in case he is getting intravascularly dry and this is preventing BUN from improving.  If things do not improve in next day or so then he might be willing to try HD.     Bryan Hubbard

## 2014-05-06 NOTE — Progress Notes (Signed)
ANTICOAGULATION CONSULT NOTE - Follow Up Consult  Pharmacy Consult for Heparin  Indication: atrial fibrillation  No Known Allergies  Patient Measurements: Height: 5\' 10"  (177.8 cm) Weight: 175 lb (79.379 kg) IBW/kg (Calculated) : 73  Vital Signs: Temp: 97.6 F (36.4 C) (08/09 0800) Temp src: Oral (08/09 0800) BP: 106/71 mmHg (08/09 0800) Pulse Rate: 97 (08/09 0003)  Labs:  Recent Labs  05/04/14 0250 05/05/14 0300 05/06/14 0238 05/06/14 0255  HGB 9.8* 9.4*  --  9.6*  HCT 29.0* 28.0*  --  28.9*  PLT 186 190  --  165  APTT 96* 78*  --  81*  HEPARINUNFRC 1.68* 1.32*  --  1.32*  CREATININE 2.76* 2.55* 2.57*  --     Estimated Creatinine Clearance: 26 ml/min (by C-G formula based on Cr of 2.57).  Medications:  Heparin 1050 units/hr  Assessment: 73 y/o M with h/o DVT/PE and aflutter on heparin while apixaban on hold in setting of acute on chronic renal failure. Heparin level remains elevated at 1.32 d/t recent apixaban(last dose 8/4) but is trending down. Aptt remains at goal at 81. Hematuria continues but patient states this is a recurring problem and since CBC stable, will continue to follow closely. Renal function also seems to be slowly improving.   Goal of Therapy:  Heparin level 0.3-0.7 units/ml aPTT 66-102 seconds Monitor platelets by anticoagulation protocol: Yes   Plan:  - Continue heparin gtt at 1050 u/hr - Daily heparin level and aPTT - Monitor CBC and continued hematuria closely  Harolyn Rutherford, PharmD Clinical Pharmacist - Resident Pager: [phone] Pharmacy: [phone] 05/06/2014 8:56 AM

## 2014-05-07 ENCOUNTER — Inpatient Hospital Stay (HOSPITAL_COMMUNITY): Payer: Medicare Other

## 2014-05-07 DIAGNOSIS — Z515 Encounter for palliative care: Secondary | ICD-10-CM

## 2014-05-07 DIAGNOSIS — R109 Unspecified abdominal pain: Secondary | ICD-10-CM

## 2014-05-07 LAB — RENAL FUNCTION PANEL
Albumin: 3.5 g/dL (ref 3.5–5.2)
Anion gap: 17 — ABNORMAL HIGH (ref 5–15)
BUN: 137 mg/dL — ABNORMAL HIGH (ref 6–23)
CALCIUM: 9 mg/dL (ref 8.4–10.5)
CO2: 32 mEq/L (ref 19–32)
Chloride: 82 mEq/L — ABNORMAL LOW (ref 96–112)
Creatinine, Ser: 2.36 mg/dL — ABNORMAL HIGH (ref 0.50–1.35)
GFR calc Af Amer: 30 mL/min — ABNORMAL LOW (ref >90)
GFR, EST NON AFRICAN AMERICAN: 26 mL/min — AB (ref >90)
Glucose, Bld: 111 mg/dL — ABNORMAL HIGH (ref 70–99)
PHOSPHORUS: 5.5 mg/dL — AB (ref 2.3–4.6)
Potassium: 3.6 mEq/L — ABNORMAL LOW (ref 3.7–5.3)
SODIUM: 131 meq/L — AB (ref 137–147)

## 2014-05-07 LAB — CBC
HCT: 27.7 % — ABNORMAL LOW (ref 39.0–52.0)
Hemoglobin: 9.2 g/dL — ABNORMAL LOW (ref 13.0–17.0)
MCH: 32.9 pg (ref 26.0–34.0)
MCHC: 33.2 g/dL (ref 30.0–36.0)
MCV: 98.9 fL (ref 78.0–100.0)
Platelets: 144 10*3/uL — ABNORMAL LOW (ref 150–400)
RBC: 2.8 MIL/uL — ABNORMAL LOW (ref 4.22–5.81)
RDW: 21.1 % — AB (ref 11.5–15.5)
WBC: 9.7 10*3/uL (ref 4.0–10.5)

## 2014-05-07 LAB — HEPARIN LEVEL (UNFRACTIONATED): HEPARIN UNFRACTIONATED: 1.05 [IU]/mL — AB (ref 0.30–0.70)

## 2014-05-07 LAB — APTT: APTT: 85 s — AB (ref 24–37)

## 2014-05-07 MED ORDER — HYDROMORPHONE HCL PF 1 MG/ML IJ SOLN: 0.5000 mg | INTRAMUSCULAR | Status: DC | PRN

## 2014-05-07 MED ORDER — FUROSEMIDE 80 MG PO TABS
160.0000 mg | ORAL_TABLET | Freq: Two times a day (BID) | ORAL | Status: DC
  Administered 2014-05-07: 160 mg via ORAL
  Filled 2014-05-07 (×5): qty 2

## 2014-05-07 MED ORDER — DARBEPOETIN ALFA-POLYSORBATE 150 MCG/0.3ML IJ SOLN: 150.0000 ug | INTRAMUSCULAR | Status: DC

## 2014-05-07 NOTE — Progress Notes (Signed)
Urine  Output is bloody, DR, [name] made aware, heparin d/ced as ordered.

## 2014-05-07 NOTE — Consult Note (Signed)
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     Goals of Care: 1.  Code Status: DNR  2. Goals of Care: Challenging case as Bryan Hubbard is almost certainly depressed or minimally having severe difficulty adjusting to his current situation.  In speaking with his wife, sister and brother-in-law today, they all feel that he has understanding of his situation (complex disease with low likelihood of long-term survival and long-term care needs).  They all admit he is fiercly independent and this is major reason he is feeling down and difficult to converse with.  His wife tells me that they had made living will and discussed advance care plans. He stated he would not want to go on ventilator if little chance of coming off and she readily voices that he would have a hard time agreeing with long-term care even if his mood was with certainty not effecting his decisions. With family discussion, he certainly seems to have understanding and consistency with his wish to pursue comfort care.  The piece that still remains unclear to me is if his logic is effected by severe depression or hopelessness.  I suspect not as he hopes to have a comfortable passing and wants to be around his wife, but this remains unclear to me as of yet.  My belief is that he likely does have capacity, but I would like to further investigate this when I meet him again tomorrow morning with his wife Bryan Hubbard.  Will plan on meeting with them both at 930.    It may do his spirits some good to transfer out of ICU as well, and I think transfer to floor is  reasonable.  Ultimately if we deem him not to be decisional, his wife would be who we turned to for making medical decisions.  Based on her description of his prior fierce independence and how difficult the past 4-5 months have been with multiple hospital admissions and rapidly declining functional status, I think she is leaning towards allowing comfort care as well.    3. Symptom Management:   1. Abd Pain- PRN ultram and percocet have not been helpful. i will order small dose of dilaudid PRN for him.  Would avoid morphine products with his renal insufficiency.  D/C ultram.  4. Psychosocial: Lived at home with his wife Bryan Hubbard (2nd marriage but been together 21 years). 2 children from previous marriage Bryan Hubbard and Bryan Hubbard.  Sister Bryan Hubbard and her husband Bryan Hubbard (physician) also offer support.  5. Spiritual: Denies having spiritual background which family confirms.     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  He appears to have receive dx of goodpasteurs in March of this year and has been maintained on prednisone and cytoxan.  His admission here was complicated by acute hypoxic resp failure initially treated as possible CHF exacerbation.  With persistent hypoxia, pulmonary was consulted with concern for recurrence of goodpasteurs disease.  He was treated with pulse dose steroids and  plasmapheresis.  Initially lung disease felt to be mutlifactorial in nature.  He had High Res CT on 7/27 which pointed towards goodpasteurs. His repeat anti-GBM, mpo, pr3 were all negative (though in setting of immunosuppression). There is also concern for possible cytoxan lung toxicity as well.  His severe hypoxemia has persisted and his hospital stay has also been complicated by AKI (now stabalized) and hematuria.  On 7/30 his cytoxan was discontinued. On 8/4, given his illness trajectory and multiple conversations, he  decided to change code status to DNR. Pulmonary has felt his long term prognosis and survivability is low and likely would require long term care.  Over past several days, Bryan Hubbard has stated he wants to stop all this and be allowed to die.    Bryan Hubbard is withdrawn with me today.  Reports being tired of all this and wants to die.  When I ask him about why this is, he reports that the last 10 days have been terrible.  He also does not want to end up going to LTAC. It is hard for him to elaborate on this further.  He is refusing his medications this afternoon. He will not tell me if there is anything that could make his situation better.  He has some abd pain in his RLQ and groin and reports that he doesn't even want to take pain medications because they have not been effective. When I talk about increasing them and/or changing he is indifferent.  When I talked about my concerns for him suffering if we withdraw current interventions and do not medicate him, he at least agrees that it would be okay to give him medicines for comfort and to alleviate suffering.  He is able to tell me he loves his life and would want her to be around him for his final hours.  Difficult for him to talk further. Did not want me to get him anything other than some ice water.     ROS: Limited as he gets frustrated with prolonged conversation.      PMH:  Past Medical History  Diagnosis Date  . Atrial flutter started in 2011  . Chronic diastolic heart failure   . DVT   . HYPERLIPIDEMIA   . HYPERTENSION, BENIGN   . ALLERGIC ASTHMA   . Palpitations   . H/O Goodpasture's syndrome     "autoimmune disease; affects kidneys and lungs"  . DVT (deep venous thrombosis) 09/2001    "right groin"  . PE (pulmonary embolism) 09/2001  . CHF (congestive heart failure)   . Anemia   . History of blood transfusion     "related to Goodpasture's syndrome; plasmapheresis process"  . Stomach ulcer   . Arthritis     "hands, knees"  (03/16/2014)  . Gout   . Renal insufficiency     "related to the Goodpasture's"  . Atrial flutter with rapid ventricular response 03/16/2014  . Acute on chronic diastolic HF (heart failure) in combination with rapid a flutter and anemia 03/18/2014  . Ejection fraction      PSH: Past Surgical History  Procedure Laterality Date  . Renal biopsy, percutaneous  12/2013  . Vena cava filter placement  09/2001  . Cardioversion N/A 04/25/2014    Procedure: CARDIOVERSION;  Surgeon: Candee Furbish, MD;  Location[facility];  Service: Cardiovascular;  Laterality: N/A;   I have reviewed the [place] and SH and  If appropriate update it with new information. No Known Allergies Scheduled Meds: . antiseptic oral  rinse  15 mL Mouth Rinse BID  . calcium acetate  667 mg Oral TID WC  . clonazePAM  0.25 mg Oral BID  . [START ON 05/10/2014] cyanocobalamin  1,000 mcg Subcutaneous Q30 days  . cyclobenzaprine  5 mg Oral QHS  . [START ON 05/10/14] darbepoetin (ARANESP) injection - NON-DIALYSIS  150 mcg Subcutaneous Q Wed-1800  . tamsulosin  0.4 mg Oral Daily   And  . dutasteride  0.5 mg Oral Daily  . feeding supplement (NEPRO CARB STEADY)  237 mL Oral BID BM  . furosemide  160 mg Oral BID  . latanoprost  1 drop Both Eyes q morning - 10a  . levalbuterol  1.25 mg Nebulization TID  . metoprolol tartrate  25 mg Oral 4 times per day  . nystatin  5 mL Oral QID  . pantoprazole  40 mg Oral Q1200  . traZODone  100 mg Oral QHS   Continuous Infusions:  PRN Meds:.bisacodyl, docusate sodium, HYDROmorphone (DILAUDID) injection, levalbuterol, metoprolol, ondansetron (ZOFRAN) IV, oxyCODONE-acetaminophen, polyethylene glycol, sodium chloride    BP 95/59  Pulse 105  Temp(Src) 97.5 F (36.4 C) (Oral)  Resp 16  Ht 5\' 10"  (1.778 m)  Wt 77.565 kg (171 lb)  BMI 24.54 kg/m2  SpO2 93%   PPS: 30   Intake/Output Summary (Last 24 hours) at 05/07/14 1749 Last data filed at 05/07/14 1742  Gross per 24 hour  Intake    479 ml   Output   1400 ml  Net   -921 ml    Physical Exam:  General: Alert, NAD HEENT:  , sclera anicteric Chest:   CTAB, symm exp CVS: mild tachy, regular Abdomen: soft, NT, ND Ext: mild edema Psych: withdrawn and flat affect  Labs: CBC    Component Value Date/Time   WBC 9.7 05/07/2014 0230   WBC 10.4* 12/29/2011 1003   RBC 2.80* 05/07/2014 0230   RBC 2.70* 01/20/2014 0913   RBC 4.05* 12/29/2011 1003   HGB 9.2* 05/07/2014 0230   HGB 12.9* 12/29/2011 1003   HCT 27.7* 05/07/2014 0230   HCT 38.5 12/29/2011 1003   PLT 144* 05/07/2014 0230   PLT 272 12/29/2011 1003   MCV 98.9 05/07/2014 0230   MCV 94.9 12/29/2011 1003   MCH 32.9 05/07/2014 0230   MCH 31.8 12/29/2011 1003   MCHC 33.2 05/07/2014 0230   MCHC 33.5 12/29/2011 1003   RDW 21.1* 05/07/2014 0230   RDW 14.2 12/29/2011 1003   LYMPHSABS 0.5* 04/29/2014 0750   LYMPHSABS 1.5 12/29/2011 1003   MONOABS 0.2 04/29/2014 0750   MONOABS 1.4* 12/29/2011 1003   EOSABS 0.0 04/29/2014 0750   EOSABS 0.3 12/29/2011 1003   BASOSABS 0.0 04/29/2014 0750   BASOSABS 0.0 12/29/2011 1003    BMET    Component Value Date/Time   NA 131* 05/07/2014 0230   K 3.6* 05/07/2014 0230   CL 82* 05/07/2014 0230   CO2 32 05/07/2014 0230   GLUCOSE 111* 05/07/2014 0230   BUN 137* 05/07/2014 0230   CREATININE 2.36* 05/07/2014 0230   CALCIUM 9.0 05/07/2014 0230   GFRNONAA 26* 05/07/2014 0230   GFRAA 30* 05/07/2014 0230    CMP     Component Value Date/Time   NA 131* 05/07/2014 0230   K 3.6* 05/07/2014 0230   CL 82* 05/07/2014 0230   CO2 32 05/07/2014 0230   GLUCOSE 111* 05/07/2014 0230   BUN 137* 05/07/2014 0230   CREATININE 2.36* 05/07/2014 0230   CALCIUM 9.0 05/07/2014 0230   PROT  5.1* 04/29/2014 0750   ALBUMIN 3.5 05/07/2014 0230   AST 28 04/29/2014 0750   ALT 20 04/29/2014 0750   ALKPHOS 41 04/29/2014 0750   BILITOT 0.5 04/29/2014 0750   GFRNONAA 26* 05/07/2014 0230   GFRAA 30* 05/07/2014 0230   7/27 High Res CT Chest IMPRESSION:  1. 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 Superimposed  ground-glass is indicative of pulmonary hemorrhage.  2. Enlarged pulmonary arteries, indicative of pulmonary arterial  hypertension.  3. Three-vessel coronary artery calcification.  4. Low-attenuation lesions in the thyroid. Consider further  evaluation with thyroid ultrasound. If patient is clinically  hyperthyroid, consider nuclear medicine thyroid uptake and scan.    05/03/14 CXR IMPRESSION:  Stable interstitial lung disease    Time In: 400 Time Out: 540 Total Time: 100 minutes  Greater than 50%  of this time was spent counseling and coordinating care related to the above assessment and plan.  Doran Clay D.O. Palliative Medicine Team at [facility]  Pager: [phone] Team Phone: [phone]

## 2014-05-07 NOTE — Progress Notes (Signed)
[facility] PCCM  S: profoundly depressed, wants to die O: Filed Vitals:   05/07/14 0741 05/07/14 0830 05/07/14 1212 05/07/14 1451  BP: 111/71  109/70   Pulse: 89  102   Temp: 97.4 F (36.3 C)  97.6 F (36.4 C)   TempSrc: Oral  Oral   Resp: 14  12   Height:      Weight:      SpO2: 96% 93% 93% 93%   Gen: weak, depressed HEENT: high flow O2 in place PULM: minimal crackles in bases, no wheezing CV: Irreg irreg, no mgr AB: BS+, soft  Ext: warm, no edema Neuro:  Impression: 1) Hypoxemic respiratory failure due to pulmonary fibrosis> cytoxan toxicity? Oxygenation stable if not improving 2) Goodpasture syndrome/microscopic polyangitis> currently not active 3) AKI > high BUN, improving Cr 4) Profound depression.  Plan: Move to palliative medicine unit Consult palliative care> Mr. Ilic is profoundly depressed, and clearly wants to die.  His lung disease is severe and likelihood of survival is low, however his oxygenation has not worsened (if anything it may be slightly better).  If he has a chance at survival, then it will take weeks of waiting in LTAC to see if he gets better.  He is refusing this option.  I am consulting palliative medicine to help [place] ensure that he has the capacity to decide this in the midst of his depression and to help delineate what withdrawal of care would look like.    Will move to a palliative medicine floor bed per patient/family request.  Roselie Awkward, MD [place] PCCM Pager: [phone] Cell: [phone] If no response, call [phone]

## 2014-05-07 NOTE — Progress Notes (Signed)
RT called to patient room at this time due to patient saying he felt like he couldn't breathe good. I turned his flow up to 40 lpm for patient comfort. RN is giving night time meds at this time as well. Patient is looking and feeling more comfortable already. RT will continue to assist as needed.

## 2014-05-07 NOTE — Progress Notes (Signed)
Physical Therapy Treatment Patient Details Name: Bryan Hubbard MRN: [id number] DOB: [birth year] Today's Date: 05/07/2014    History of Present Illness This 73 y.o. 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 LE edema. Diagnosis:  CHF exacerbation; A-fib; orthostatic hypotension; NSTEMI.  He underwent cardioversion 04/08/2014 due to aflutter with RVR.  He has continued with chronic hypoxemia respiratory failure presumed due to PE, pulmonary edema and underlying parenchymal lung disease (Goodpature syndrome).  Chest CT 04/23/14 with questionable worsening Goodpasture's syndrome, but  he had normal antibody panel making Goodpastures unlikely. Underwent plasmapheresis x 2.  He has continued Hypoxemia and is currently (04/27/14) on High flow 02 and PT/OT were ordered to attempt EOB activity.      PT Comments    Pt progressing slowly towards physical therapy goals. Wife encouraged pt to participate with PT today. Was able to tolerate AAROM exercise, progressing to PROM as reps increased. O2 sats decreased to 89% during supine exercise. Pt positioned at end of session with pillows under hips as pt states his hips have been hurting lately. Will continue to follow and progress as able per POC.   Follow Up Recommendations  LTACH     Equipment Recommendations  None recommended by PT    Recommendations for Other Services       Precautions / Restrictions Precautions Precautions: Fall Precaution Comments: high flow O2 Restrictions Weight Bearing Restrictions: No    Mobility  Bed Mobility Overal bed mobility: Needs Assistance Bed Mobility: Rolling Rolling: Mod assist         General bed mobility comments: Pt reaching for bed rails for support, however required bed pad to achieve full roll.   Transfers                     Ambulation/Gait                 Stairs            Wheelchair Mobility    Modified Rankin (Stroke Patients Only)       Balance                                    Cognition Arousal/Alertness: Lethargic Behavior During Therapy: WFL for tasks assessed/performed;Flat affect Overall Cognitive Status: Within Functional Limits for tasks assessed                      Exercises General Exercises - Lower Extremity Ankle Circles/Pumps: 15 reps Heel Slides: 10 reps;AAROM Hip ABduction/ADduction: 10 reps;AAROM Other Exercises Other Exercises: 3x30" hamstring stretch while supine, bilaterally    General Comments        Pertinent Vitals/Pain Pain Assessment: Faces Faces Pain Scale: Hurts little more Pain Intervention(s): Limited activity within patient's tolerance    Home Living                      Prior Function            PT Goals (current goals can now be found in the care plan section) Acute Rehab PT Goals Patient Stated Goal: To get stronger  PT Goal Formulation: With patient Time For Goal Achievement: 05/11/14 Potential to Achieve Goals: Fair Progress towards PT goals: Progressing  toward goals    Frequency  Min 2X/week    PT Plan Discharge plan needs to be updated    Co-evaluation             End of Session Equipment Utilized During Treatment: Oxygen Activity Tolerance: Patient limited by fatigue Patient left: in bed;with call bell/phone within reach;with family/visitor present     Time: 0913-0949 PT Time Calculation (min): 36 min  Charges:  $Therapeutic Exercise: 8-22 mins $Therapeutic Activity: 8-22 mins                    G Codes:      Jolyn Lent 04-Jun-2014, 12:28 PM  Jolyn Lent, PT, DPT Acute Rehabilitation Services Pager: [phone]

## 2014-05-07 NOTE — Progress Notes (Signed)
Refused to take meds, MD aware.

## 2014-05-07 NOTE — Progress Notes (Signed)
Transferred to [place] by bed, report given to [employer]. belongingswith family.

## 2014-05-07 NOTE — Progress Notes (Signed)
Subjective: Interval History: has complaints, has had enough and does not want to cont.  Objective: Vital signs in last 24 hours: Temp:  [97.2 F (36.2 C)-98 F (36.7 C)] 97.4 F (36.3 C) (08/10 0741) Pulse Rate:  [87-105] 89 (08/10 0741) Resp:  [11-17] 14 (08/10 0741) BP: (100-132)/(56-87) 111/71 mmHg (08/10 0741) SpO2:  [92 %-97 %] 93 % (08/10 0830) FiO2 (%):  [50 %-60 %] 50 % (08/10 0830) Weight:  [77.565 kg (171 lb)] 77.565 kg (171 lb) (08/10 0501) Weight change:   Intake/Output from previous day: 08/09 0701 - 08/10 0700 In: 591 [P.O.:360; I.V.:231] Out: 600 [Urine:600] Intake/Output this shift: Total I/O In: 10.5 [I.V.:10.5] Out: -   General appearance: cooperative, fatigued and pale Resp: fine rales bilat Cardio: irregularly irregular rhythm and systolic murmur: holosystolic 2/6, blowing at apex GI: pos bs, liver down 4-5 cm Extremities: edema 3+  Lab Results:  Recent Labs  05/06/14 0255 05/07/14 0230  WBC 9.6 9.7  HGB 9.6* 9.2*  HCT 28.9* 27.7*  PLT 165 144*   BMET:  Recent Labs  05/06/14 0238 05/07/14 0230  NA 132* 131*  K 4.0 3.6*  CL 83* 82*  CO2 32 32  GLUCOSE 111* 111*  BUN 144* 137*  CREATININE 2.57* 2.36*  CALCIUM 9.0 9.0   No results found for this basename: PTH,  in the last 72 hours Iron Studies: No results found for this basename: IRON, TIBC, TRANSFERRIN, FERRITIN,  in the last 72 hours  Studies/Results: No results found.  I have reviewed the patient's current medications.  Assessment/Plan: 1 AKI improving.  ^ BUN with steroids, ? GI source.  Acid base/k ok.  F/u CXR. Has periph vol xs. 2 Hematuria ? anticoag 3 CHF improving 4 Anemia 5 Goodpastures  Not sure lungs are not part of this picture 6 Interstitial lung dz 7 CKD  ? Where will plateau 8A flutter/fib 9 ? Goals of care .  Some depression but not sure he is not ready to get comfort measures, will cont to counsel and get minister to discuss, discussed with wife. P Po  lasix, epo, follow chem, counsel.    LOS: 20 days   Najir Roop L 05/07/2014,8:50 AM

## 2014-05-07 NOTE — Progress Notes (Signed)
When asked if he is ready for his pills, badly refused, explained the importance to take his meds. Wife claimed to wait until his sisters come and see if he can be convinced.

## 2014-05-07 NOTE — Progress Notes (Signed)
ANTICOAGULATION CONSULT NOTE - Follow Up Consult  Pharmacy Consult for Heparin  Indication: atrial fibrillation  No Known Allergies  Patient Measurements: Height: 5\' 10"  (177.8 cm) Weight: 171 lb (77.565 kg) IBW/kg (Calculated) : 73  Vital Signs: Temp: 97.6 F (36.4 C) (08/10 1212) Temp src: Oral (08/10 1212) BP: 109/70 mmHg (08/10 1212) Pulse Rate: 102 (08/10 1212)  Labs:  Recent Labs  05/05/14 0300 05/06/14 0238 05/06/14 0255 05/07/14 0230  HGB 9.4*  --  9.6* 9.2*  HCT 28.0*  --  28.9* 27.7*  PLT 190  --  165 144*  APTT 78*  --  81* 85*  HEPARINUNFRC 1.32*  --  1.32* 1.05*  CREATININE 2.55* 2.57*  --  2.36*    Estimated Creatinine Clearance: 28.4 ml/min (by C-G formula based on Cr of 2.36).  Medications:  Heparin @ 1050 units/hr  Assessment: 69 yom continues on heparin for h/o DVT/PE and aflutter while apixaban on hold in setting of acute on chronic renal failure. Heparin level remains elevated at 1.05 d/t recent apixaban (last dose 8/4) but is trending down. Aptt is therapeutic. Per RN patient continues to have blood tinged urine, however, CBC remains stable. Renal function also seems to be slowly improving.   Goal of Therapy:  Heparin level 0.3-0.7 units/ml aPTT 66-102 seconds Monitor platelets by anticoagulation protocol: Yes   Plan:  1) Continue heparin at 1050 units/hr 2) Follow up heparin level and CBC in AM  Nena Jordan, PharmD, BCPS 05/07/2014 1:32 PM

## 2014-05-07 NOTE — Progress Notes (Signed)
Bryan Hubbard  Bryan Hubbard EGB:[id number] DOB: 11-03-1940 DOA: 03/31/2014 PCP: [name], MD  Admit HPI / Brief Narrative: 73 yo M w/ Hx chronic atrial flutter, DVT, Goodpasture's, and recent oxygen dependent respiratory failure after hospitalization for community acquired pneumonia who presented to the hospital with progressive shortness of breath and lower extremity edema. In addition he was becoming very dizzy and very weak with standing. He notified his Cardiologist of these symptoms several days prior to presentation and was instructed to decrease his metoprolol to 25 mg by mouth twice a day. Unfortunately his symptoms did not improve.   He presented to the hospital where it was noted that his systolic blood pressure was in the upper 80s and upon standing dropped into the 70s. Review of patient medication revealed he was taking Lasix 20 mg daily. In addition he endorsed having to increase his oxygen at home from 2 L to 4 L because of progressive shortness of breath. A two-view chest x-ray demonstrated acutely worsened diffuse bilateral airspace opacification and diffuse increased interstitial markings.   HPI/Subjective: Clinically no changes in respiratory status but much more depressed today  Assessment/Plan:  Acute on chronic respiratory failure with hypoxia -Patient REMAINS STABLE on high flow nasal cannula - has actually weaned to 50% -pt now DNR but at time code status was changed desired to continue aggressive care  -Pulmonary expected prolonged hospitalization and once more stable from a renal standpoint consideration given to transfer to LTAC (possibly this week - family report would only be interested in [facility])-pt now desires death and comfort over cure but unclear if depression and azotemia contributing- Dr. Lake Bells has asked Palliative to evaluate to determine if this is truly the most appropriate course to take  Atrial flutter with  rapid ventricular response -back in AF as of ~8/5 PM with rates in low 90s to 110s -Cardiology has signed off - if rates increase will need to reconsult -Initially failed attempts to use cardizem, BB, or both to control RVR  -Ibutilide infusion was unsuccessful > DCCV successful under bedside anesthesia and until the afternoon of 8/5 was maintaining NSR   -ECHO demonstrated a decrease in systolic function from 37-10% to 40-45%  -anticoag via heparin at this time   Hematuria (recurrent)  -pt states this is a recurring problem - persists, but Hgb is stable so follow only for now   Glomerulonephritis / Acute kidney Injury on Chronic kidney disease stage 3, GFR 30-59 ml/min  -Nephrology reconsulted - high dose Lasix re-initiated 8/2  -renal function slowly improving with aggressive diuresis and is now appropriate for LTAC -has transitioned to PO dosing  Goodpasture's  -chronically treated with steroid, oral cytoxan  -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 Cytoxan toxicity > PCCM stopped Cytoxan  -began plasmapheresis but serologies were negative for Goodpastures etiology so plasmapheresis dcd 7/30  -High dose Solu-Medrol tapered off  Acute systolic CHF  -resolved   Oral candida -began Nystatin 8/4 - plan to stop after 10 days of tx   Transient melena  -Occurred 7/30 and has not recurred so anticoagulation resumed 8/3  Hypoxemic mediated anxiety  -Klonopin    Abnormal UA  -cx positive only for 20,000 colonies of coag neg staph - further tx not currently indicated   Anemia of chronic disease  -Hgb stable - baseline between 7.9 and 9   Thrombocytopenia  -resolved  HYPERLIPIDEMIA   HYPERTENSION  -BP controlled  History  of DVT / PE / IVC filter  -was transitioned from warfarin to eliquis during the last hospitalization at recommendation of the Pulmonologist in setting of inability to maintain therapeutic INR on warfarin -Eliquis dc'd  in favor of Heparin due to renal function and GIB  Code Status: FULL Family Communication: spoke w/ wife at bedside   Disposition Plan: SDU -was awaiting LTAC bed-due to need to maintain consistency and continuity of care pt/wife prefer [facility] --8/10:-Dr. Lake Bells had lengthy d/w pt who is expressing desire to die   Consultants: Dr. Saralyn Pilar Wright/Dr. Roselie Awkward (PCCM) Dr. Edrick Oh (Nephrology)  Procedures: 7/29 echocardiogram - Left ventricle: mild LVH. LVEF= 40% to 45% - Mitral valve:  mild regurgitation - Tricuspid valve: moderate regurgitation  DVT prophylaxis: heparin  Objective: Blood pressure 109/70, pulse 102, temperature 97.6 F (36.4 C), temperature source Oral, resp. rate 12, height 5\' 10"  (1.778 m), weight 171 lb (77.565 kg), SpO2 93.00%.  Intake/Output Summary (Last 24 hours) at 05/07/14 1439 Last data filed at 05/07/14 1200  Gross per 24 hour  Intake  369.5 ml  Output    600 ml  Net -230.5 ml   Exam: General: No acute respiratory distress beyond ongoing "stable distress" Lungs: faint diffuse crackles without wheezes or rhonchi Cardiovascular: irreg irreg - rate controlled  Abdomen: Nontender, nondistended, soft, bowel sounds positive, no rebound, no ascites, no appreciable mass GU: red tinged urine with occasional red sediment Extremities: No significant cyanosis, clubbing;  1+ edema bilateral lower extremities  Data Reviewed: Basic Metabolic Panel:  Recent Labs Lab 05/03/14 0346 05/04/14 0250 05/05/14 0300 05/06/14 0238 05/07/14 0230  NA 138 138 136* 132* 131*  K 2.8* 3.6* 3.6* 4.0 3.6*  CL 87* 88* 88* 83* 82*  CO2 28 31 29  32 32  GLUCOSE 154* 112* 114* 111* 111*  BUN 153* 148* 145* 144* 137*  CREATININE 2.93* 2.76* 2.55* 2.57* 2.36*  CALCIUM 9.0 9.3 9.3 9.0 9.0  MG  --   --  2.5  --   --   PHOS 6.8* 5.8* 5.9* 6.3* 5.5*    Liver Function Tests:  Recent Labs Lab 05/02/14 0443 05/03/14 0346 05/04/14 0250 05/06/14 0238 05/07/14 0230    ALBUMIN 3.7 3.7 3.7 3.5 3.5   CBC:  Recent Labs Lab 05/03/14 0346 05/04/14 0250 05/05/14 0300 05/06/14 0255 05/07/14 0230  WBC 11.1* 11.3* 10.2 9.6 9.7  HGB 9.4* 9.8* 9.4* 9.6* 9.2*  HCT 27.3* 29.0* 28.0* 28.9* 27.7*  MCV 95.1 97.3 97.2 99.7 98.9  PLT 174 186 190 165 144*   Studies:  Recent x-ray studies have been reviewed in detail by the Attending Physician  Scheduled Meds:  Scheduled Meds: . antiseptic oral rinse  15 mL Mouth Rinse BID  . calcium acetate  667 mg Oral TID WC  . clonazePAM  0.25 mg Oral BID  . [START ON 05/10/2014] cyanocobalamin  1,000 mcg Subcutaneous Q30 days  . cyclobenzaprine  5 mg Oral QHS  . [START ON 05-12-2014] darbepoetin (ARANESP) injection - NON-DIALYSIS  150 mcg Subcutaneous Q Wed-1800  . tamsulosin  0.4 mg Oral Daily   And  . dutasteride  0.5 mg Oral Daily  . feeding supplement (NEPRO CARB STEADY)  237 mL Oral BID BM  . furosemide  160 mg Oral BID  . latanoprost  1 drop Both Eyes q morning - 10a  . levalbuterol  1.25 mg Nebulization TID  . metoprolol tartrate  25 mg Oral 4 times per day  . nystatin  5 mL  Oral QID  . pantoprazole  40 mg Oral Q1200  . traZODone  100 mg Oral QHS    Time spent on care of this patient: 35 mins  Erin Hearing, ANP [facility] For Consults/Admissions - Flow Manager - [phone] Office  [phone] Pager [phone]  On-Call/Text Page:      [email or web address]      password [facility]  05/07/2014, 2:39 PM   LOS: 20 days   I have personally examined this patient and reviewed the entire database. I have reviewed the above Hubbard, made any necessary editorial changes, and agree with its content.  Cherene Altes, MD [facility]

## 2014-05-08 DIAGNOSIS — R0989 Other specified symptoms and signs involving the circulatory and respiratory systems: Secondary | ICD-10-CM

## 2014-05-08 DIAGNOSIS — R0609 Other forms of dyspnea: Secondary | ICD-10-CM

## 2014-05-08 LAB — CBC
HCT: 28.2 % — ABNORMAL LOW (ref 39.0–52.0)
HEMOGLOBIN: 9.3 g/dL — AB (ref 13.0–17.0)
MCH: 33.3 pg (ref 26.0–34.0)
MCHC: 33 g/dL (ref 30.0–36.0)
MCV: 101.1 fL — ABNORMAL HIGH (ref 78.0–100.0)
PLATELETS: 140 10*3/uL — AB (ref 150–400)
RBC: 2.79 MIL/uL — ABNORMAL LOW (ref 4.22–5.81)
RDW: 21.1 % — ABNORMAL HIGH (ref 11.5–15.5)
WBC: 8.7 10*3/uL (ref 4.0–10.5)

## 2014-05-08 LAB — COMPREHENSIVE METABOLIC PANEL
ALT: 32 U/L (ref 0–53)
AST: 41 U/L — ABNORMAL HIGH (ref 0–37)
Albumin: 3.3 g/dL — ABNORMAL LOW (ref 3.5–5.2)
Alkaline Phosphatase: 66 U/L (ref 39–117)
Anion gap: 18 — ABNORMAL HIGH (ref 5–15)
BUN: 135 mg/dL — ABNORMAL HIGH (ref 6–23)
CALCIUM: 8.9 mg/dL (ref 8.4–10.5)
CO2: 29 mEq/L (ref 19–32)
Chloride: 84 mEq/L — ABNORMAL LOW (ref 96–112)
Creatinine, Ser: 2.22 mg/dL — ABNORMAL HIGH (ref 0.50–1.35)
GFR calc non Af Amer: 27 mL/min — ABNORMAL LOW (ref >90)
GFR, EST AFRICAN AMERICAN: 32 mL/min — AB (ref >90)
GLUCOSE: 112 mg/dL — AB (ref 70–99)
Potassium: 3.2 mEq/L — ABNORMAL LOW (ref 3.7–5.3)
Sodium: 131 mEq/L — ABNORMAL LOW (ref 137–147)
TOTAL PROTEIN: 5.2 g/dL — AB (ref 6.0–8.3)
Total Bilirubin: 1.3 mg/dL — ABNORMAL HIGH (ref 0.3–1.2)

## 2014-05-08 LAB — PHOSPHORUS: PHOSPHORUS: 5.2 mg/dL — AB (ref 2.3–4.6)

## 2014-05-08 MED ORDER — MORPHINE BOLUS VIA INFUSION
5.0000 mg | INTRAVENOUS | Status: DC | PRN
  Filled 2014-05-08: qty 20

## 2014-05-08 MED ORDER — HYDROMORPHONE HCL PF 1 MG/ML IJ SOLN: 1.0000 mg | INTRAMUSCULAR | Status: DC | PRN

## 2014-05-08 MED ORDER — SODIUM CHLORIDE 0.9 % IV SOLN
0.5000 mg/h | INTRAVENOUS | Status: DC
  Administered 2014-05-08: 0.5 mg/h via INTRAVENOUS
  Administered 2014-05-08 (×2): 1 mg/h via INTRAVENOUS
  Filled 2014-05-08: qty 5

## 2014-05-08 MED ORDER — MORPHINE SULFATE 10 MG/ML IJ SOLN
10.0000 mg/h | INTRAVENOUS | Status: DC
  Filled 2014-05-08: qty 10

## 2014-05-08 MED ORDER — LORAZEPAM 2 MG/ML IJ SOLN
1.0000 mg | INTRAMUSCULAR | Status: DC | PRN
  Administered 2014-05-08: 2 mg via INTRAVENOUS
  Filled 2014-05-08: qty 1

## 2014-05-08 MED FILL — Epoetin Alfa Inj 20000 Unit/ML: INTRAMUSCULAR | Qty: 1 | Status: AC

## 2014-05-08 MED FILL — Epoetin Alfa Inj 10000 Unit/ML: INTRAMUSCULAR | Qty: 1 | Status: AC

## 2014-05-08 NOTE — Progress Notes (Signed)
Occupational Therapy Discharge Patient Details Name: Bryan Hubbard MRN: [id number] DOB: [birth year] Today's Date: 05/08/2014 Time:  -     Patient discharged from OT services secondary to Order for discontinuation of OT received.  Pt and family have chosen to withdrawal care and move to comfort. .  Please see latest therapy progress note for current level of functioning and progress toward goals.    Progress and discharge plan discussed with patient and/or caregiver: Order for discontinuation recieved from MD.   Bruce, [place], OTR/L [phone]  05/08/2014, 12:54 PM

## 2014-05-08 NOTE — Progress Notes (Signed)
Subjective: Interval History: has no complaint to me but refused meds and wants to rest..  Objective: Vital signs in last 24 hours: Temp:  [97.5 F (36.4 C)-97.6 F (36.4 C)] 97.6 F (36.4 C) (08/11 0630) Pulse Rate:  [32-105] 72 (08/11 0846) Resp:  [12-17] 16 (08/11 0846) BP: (95-109)/(59-70) 109/69 mmHg (08/11 0630) SpO2:  [93 %-97 %] 94 % (08/11 0846) FiO2 (%):  [50 %-60 %] 60 % (08/11 0846) Weight:  [77 kg (169 lb 12.1 oz)] 77 kg (169 lb 12.1 oz) (08/11 0630) Weight change: -0.565 kg (-1 lb 3.9 oz)  Intake/Output from previous day: 08/10 0701 - 08/11 0700 In: 212 [P.O.:170; I.V.:42] Out: 1175 [Urine:1175] Intake/Output this shift:    General appearance: pale and sedate, minimal interaction but appropriate Resp: rubs bilaterally Cardio: S1, S2 normal and systolic murmur: holosystolic 2/6, blowing at apex GI: liver down 6 cm, soft Extremities: edema 1+  Lab Results:  Recent Labs  05/07/14 0230 05/08/14 0403  WBC 9.7 8.7  HGB 9.2* 9.3*  HCT 27.7* 28.2*  PLT 144* 140*   BMET:  Recent Labs  05/07/14 0230 05/08/14 0403  NA 131* 131*  K 3.6* 3.2*  CL 82* 84*  CO2 32 29  GLUCOSE 111* 112*  BUN 137* 135*  CREATININE 2.36* 2.22*  CALCIUM 9.0 8.9   No results found for this basename: PTH,  in the last 72 hours Iron Studies: No results found for this basename: IRON, TIBC, TRANSFERRIN, FERRITIN,  in the last 72 hours  Studies/Results: No results found.  I have reviewed the patient's current medications.  Assessment/Plan: 1  AKI/CKD  Improving.  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

## 2014-05-08 NOTE — Progress Notes (Signed)
Pt is refusing all 1000 medications at this time.  Will continue to monitor. Syliva Overman

## 2014-05-08 NOTE — Progress Notes (Signed)
[name] ION:[id number] DOB: [birth year] DOA: 04/03/2014 PCP: [name], MD  Admit HPI / Brief Narrative: 73 yo M w/ Hx chronic atrial flutter, DVT, Goodpasture's, and recent oxygen dependent respiratory failure after hospitalization for community acquired pneumonia who presented to the hospital with progressive shortness of breath and lower extremity edema. In addition he was becoming very dizzy and very weak with standing. He notified his Cardiologist of these symptoms several days prior to presentation and was instructed to decrease his metoprolol to 25 mg by mouth twice a day. Unfortunately his symptoms did not improve.   He presented to the hospital where it was noted that his systolic blood pressure was in the upper 80s and upon standing dropped into the 70s. Review of patient medication revealed he was taking Lasix 20 mg daily. In addition he endorsed having to increase his oxygen at home from 2 L to 4 L because of progressive shortness of breath. A two-view chest x-ray demonstrated acutely worsened diffuse bilateral airspace opacification and diffuse increased interstitial markings.   HPI/Subjective: Remains very depressed-states not at "peace" with decision to focus on comfort (see below)  Assessment/Plan:  Acute on chronic respiratory failure with hypoxia -Patient REMAINS STABLE on high flow nasal cannula - has actually weaned to 50% -pt now DNR but at time code status was changed desired to continue aggressive care  -Pulmonary expected prolonged hospitalization and once more stable from a renal standpoint consideration given to transfer to LTAC (possibly this week - family report would only be interested in [facility]) -8/10: pt endorsed desired death and comfort over cure but unclear if depression and azotemia contributing- Dr. Lake Bells asked Palliative to evaluate to determine if this is truly the most appropriate course to  take -8/11: I had extensive d/w pt this am re his lack of peace regarding his decision to withdraw care- clearly understands prognosis and due to lack of curative treatments and no possible way to regain independence he prefers to die. Discussed spirituality- stated really has no spiritual beliefs- he also discussed this with the Palliative MD this am and confided he felt like God was mad at him but he had been discussing this with a family member who is a minister-further discussion regarding EOL and comfort measures per Dr. Lake Bells: pt now ready to proceed with stopping HF oxygen (Terminal weaning) in favor of comfort [place] oxygen and beginning Dilaudid drip- again he verbalizes he understands he will die-cont IV Ativan prn -COMFORT CARE  Atrial flutter with rapid ventricular response -back in AF as of ~8/5 PM with rates in low 90s to 110s -Cardiology has signed off - if rates increase will need to reconsult -Initially failed attempts to use cardizem, BB, or both to control RVR  -Ibutilide infusion was unsuccessful > DCCV successful under bedside anesthesia and until the afternoon of 8/5 was maintaining NSR   -ECHO demonstrated a decrease in systolic function from 24-40% to 40-45%  -stopping Heparin and other meds due to comfort focus  -COMFORT CARE  Hematuria (recurrent)  -pt states this is a recurring problem - persists but not treating due to comfort focus -COMFORT CARE  Glomerulonephritis / Acute kidney Injury on Chronic kidney disease stage 3, GFR 30-59 ml/min  -Nephrology reconsulted - high dose Lasix re-initiated 8/2 but stopping unless needs for comfort dosing prn -COMFORT CARE  Goodpasture's  -chronically treated with steroid, oral cytoxan  -chest x-ray consistent with interstitial lung disease of uncertain  etiology - ? Cytoxan toxicity > PCCM stopped Cytoxan  -began plasmapheresis but serologies were negative for Goodpastures etiology so plasmapheresis dcd 7/30  -High dose  Solu-Medrol tapered off -COMFORT CARE  Acute systolic CHF  -resolved -COMFORT CARE   Oral candida -began Nystatin 8/4 - plan to stop after 10 days of tx  -COMFORT CARE  Transient melena  -Occurred 7/30 and has not recurred so anticoagulation resumed 8/3 -COMFORT CARE  Hypoxemic mediated anxiety  -Klonopin earlier but now transitioning to Dilaudid drip and prn Ativan -COMFORT CARE  Abnormal UA  -cx positive only for 20,000 colonies of coag neg staph - further tx not currently indicated  -COMFORT CARE  Anemia of chronic disease  -Hgb stable - baseline between 7.9 and 9  -COMFORT CARE  Thrombocytopenia  -resolved -COMFORT CARE  HYPERLIPIDEMIA   HYPERTENSION  -BP controlled  History of DVT / PE / IVC filter  -was transitioned from warfarin to eliquis during the last hospitalization at recommendation of the Pulmonologist in setting of inability to maintain therapeutic INR on warfarin -Eliquis dc'd in favor of Heparin due to renal function and GIB -COMFORT CARE    Code Status: FULL Family Communication: spoke w/ wife at bedside   Disposition Plan: Floor-focus on palliative measures for comfort and terminal weaning of HFNC oxygen  Consultants: Dr. Saralyn Pilar Wright/Dr. Roselie Awkward (PCCM) Dr. Edrick Oh (Nephrology)  Procedures: 7/29 echocardiogram - Left ventricle: mild LVH. LVEF= 40% to 45% - Mitral valve:  mild regurgitation - Tricuspid valve: moderate regurgitation  DVT prophylaxis: heparin  Objective: Blood pressure 109/69, pulse 72, temperature 97.6 F (36.4 C), temperature source Axillary, resp. rate 16, height 5\' 10"  (1.778 m), weight 169 lb 12.1 oz (77 kg), SpO2 94.00%.  Intake/Output Summary (Last 24 hours) at 05/08/14 1251 Last data filed at 05/08/14 0500  Gross per 24 hour  Intake    120 ml  Output   1175 ml  Net  -1055 ml   Exam: General: No acute respiratory distress beyond ongoing "stable distress" Lungs: faint diffuse crackles without  wheezes or rhonchi Cardiovascular: irreg irreg - rate controlled  Abdomen: Nontender, nondistended, soft, bowel sounds positive, no rebound, no ascites, no appreciable mass GU: red tinged urine with occasional red sediment Extremities: No significant cyanosis, clubbing;  1+ edema bilateral lower extremities  Data Reviewed: Basic Metabolic Panel:  Recent Labs Lab 05/04/14 0250 05/05/14 0300 05/06/14 0238 05/07/14 0230 05/08/14 0403  NA 138 136* 132* 131* 131*  K 3.6* 3.6* 4.0 3.6* 3.2*  CL 88* 88* 83* 82* 84*  CO2 31 29 32 32 29  GLUCOSE 112* 114* 111* 111* 112*  BUN 148* 145* 144* 137* 135*  CREATININE 2.76* 2.55* 2.57* 2.36* 2.22*  CALCIUM 9.3 9.3 9.0 9.0 8.9  MG  --  2.5  --   --   --   PHOS 5.8* 5.9* 6.3* 5.5* 5.2*    Liver Function Tests:  Recent Labs Lab 05/03/14 0346 05/04/14 0250 05/06/14 0238 05/07/14 0230 05/08/14 0403  AST  --   --   --   --  41*  ALT  --   --   --   --  32  ALKPHOS  --   --   --   --  66  BILITOT  --   --   --   --  1.3*  PROT  --   --   --   --  5.2*  ALBUMIN 3.7 3.7 3.5 3.5 3.3*   CBC:  Recent Labs Lab 05/04/14 0250 05/05/14 0300 05/06/14 0255 05/07/14 0230 05/08/14 0403  WBC 11.3* 10.2 9.6 9.7 8.7  HGB 9.8* 9.4* 9.6* 9.2* 9.3*  HCT 29.0* 28.0* 28.9* 27.7* 28.2*  MCV 97.3 97.2 99.7 98.9 101.1*  PLT 186 190 165 144* 140*   Studies:  Recent x-ray studies have been reviewed in detail by the Attending Physician  Scheduled Meds:  Scheduled Meds: . antiseptic oral rinse  15 mL Mouth Rinse BID  . latanoprost  1 drop Both Eyes q morning - 10a    Time spent on care of this patient: 35 mins  Erin Hearing, ANP [facility] For Consults/Admissions - Flow Manager - [phone] Office  [phone] Pager [phone]  On-Call/Text Page:      [email or web address]      password [facility]  05/08/2014, 12:51 PM   LOS: 21 days   Examined Pt and discussed A/P with ANP Ebony Hail and agree with above plan. Discussed plan with wife  and answered all questions Pt w/ Multiple complex medical problems, > 40 min spent in direct medical care

## 2014-05-08 NOTE — Progress Notes (Signed)
Nutrition Brief Note  Chart reviewed. Pt now transitioning to comfort care.  No further nutrition interventions warranted at this time.  Please re-consult as needed.    Kimberly Harris, RD, LDN, CNSC Pager [phone] After Hours Pager [phone]    

## 2014-05-08 NOTE — Progress Notes (Addendum)
Patient HQ:[facility] R Popov      DOB: 21-Jul-1941      WGY:[id number]   Palliative Medicine Team at [facility] Progress Note    Subjective:  Continues to be withdrawn. States he wants to be allowed to die.  Having some abd pain still but does not want medication for this.  Admits to feeling down.  States he doesn't have hope for anything.  Talked about if he passed away we could give him medicine to make him comfortable. Initially refused, but agreed to after discussion yesterday.  Talked again about how he does hope to have wife around.  Extremely frustrated at his loss of independence.      Filed Vitals:   05/08/14 0846  BP:   Pulse: 72  Temp:   Resp: 16   Physical exam: GEN: alert, withdrawn HEENT: [place], mmm, high flow O2 via nares CV: RRR LUNGS: CTAB, symm expansion ABD: soft, NT, ND   CBC    Component Value Date/Time   WBC 8.7 05/08/2014 0403   WBC 10.4* 12/29/2011 1003   RBC 2.79* 05/08/2014 0403   RBC 2.70* 01/20/2014 0913   RBC 4.05* 12/29/2011 1003   HGB 9.3* 05/08/2014 0403   HGB 12.9* 12/29/2011 1003   HCT 28.2* 05/08/2014 0403   HCT 38.5 12/29/2011 1003   PLT 140* 05/08/2014 0403   PLT 272 12/29/2011 1003   MCV 101.1* 05/08/2014 0403   MCV 94.9 12/29/2011 1003   MCH 33.3 05/08/2014 0403   MCH 31.8 12/29/2011 1003   MCHC 33.0 05/08/2014 0403   MCHC 33.5 12/29/2011 1003   RDW 21.1* 05/08/2014 0403   RDW 14.2 12/29/2011 1003   LYMPHSABS 0.5* 04/29/2014 0750   LYMPHSABS 1.5 12/29/2011 1003   MONOABS 0.2 04/29/2014 0750   MONOABS 1.4* 12/29/2011 1003   EOSABS 0.0 04/29/2014 0750   EOSABS 0.3 12/29/2011 1003   BASOSABS 0.0 04/29/2014 0750   BASOSABS 0.0 12/29/2011 1003    CMP     Component Value Date/Time   NA 131* 05/08/2014 0403   K 3.2* 05/08/2014 0403   CL 84* 05/08/2014 0403   CO2 29 05/08/2014 0403   GLUCOSE 112* 05/08/2014 0403   BUN 135* 05/08/2014 0403   CREATININE 2.22* 05/08/2014 0403   CALCIUM 8.9 05/08/2014 0403   PROT 5.2* 05/08/2014 0403   ALBUMIN 3.3* 05/08/2014 0403   AST 41*  05/08/2014 0403   ALT 32 05/08/2014 0403   ALKPHOS 66 05/08/2014 0403   BILITOT 1.3* 05/08/2014 0403   GFRNONAA 27* 05/08/2014 0403   GFRAA 32* 05/08/2014 0403    Assessment and plan: 73 yo male with goodpasteurs vs cytoxan induced lung injury/ILD and prolonged hospitalization with request to transition to comfort care.   1. Code Status: DNR   2. Goals of Care:  See initial consultation note.  Continues to be very withdrawn today even in presence of wife [name]. Reinforces that he is tired of this and wants to be allowed to die.  Reports feeling down and hopeless. At same time wants his wife around him.  Does not want his children to come.  Was frustrated by his sister (who is Systems developer) and her husband talking with him too much yesterday.    In speaking with [name] this morning, she discusses the struggle of the past 4-5 months. He has frequently been hospitalized and lost ability to do the things he loves to do (independent living, gardening, being outdoors, control).  She feels like he understands his situation  and already struggling with prior state.  [name] also feels like the reason he is so down are related to the prospects of returning to the life he enjoys are low.  She worries about him potentially refusing medications to keep him comfortable (which he has fluctuated on). She also mentions that he discussed "god is punishing" him a few weeks ago and wonders if his spirituality is effecting him. He refused chaplin consult here, but has a friend that is minister who has been coming here to offer him support.   Ultimately this remains difficult situation. Even though he expresses hopelessness at times this almost certainly relates to frustrations regarding care and loss of control on top of depression. While he states hopelessness, he also turns to comfort from [name] and requested he lay next to her last night.  My suspicion continues to be that he likely has capacity.  In talking  with [name], if we deemed him not to have capacity, she also seems to be leaning towards making him comfortable and not forcing him to undergo treatments for above reasons.  I did offer psychiatric consultation to help decide on capcity, but she wants to hold off on this at the moment (Again, if deemed not to have capacity, she seems to lean towards making him comfortable because of poor QOL considerations).  She would like to talk more with Percell Miller before we make any decisions which I think is reasonable. I will touch base with them again in afternoon.   3. Symptom Management:  1. Abd Pain- not taking PRN's. Suspect this is related to not having a lot of physical pain at moment.  2. Terminal Pain/Dyspnea- If we move to withdraw care (as appears we are heading towards), would treat this like terminal vent wean. Suspect he would have profound hypoxia off high flow.  I would avoid morphine products and used dilaudid instead given his renal insufficiency.  Would bolus with 0.5mg  IV dilaudid prior to removal of high flow O2 and start dilaudid 0.5mg /hr. 0.5mg  bolus dose q32min PRN.  Also lorazepam 1-2mg  IV q1h PRN.  He may receive comfort from even low flow O2 via [place] (2lpm) if he allows.                4.  Psychosocial: Lived at home with [name] (2nd marriage but been together 21 years). 2 children from previous marriage Lennette Bihari and [place]. Sister Raford Pitcher and her husband Jenny Reichmann (physician) also offer support. Former Chiropractor.    5. Spiritual: Denies having spiritual background which family confirms. However, has expressed statements about "god punishing him". Continues to refuse our spiritual support teams, but has friend who is minister that is working through some of this as Ed allows.      Time In: 930 Time Out: 1020 Total Time: 50 minutes  Greater than 50% of this time was spent counseling and coordinating care related to the above assessment and plan.  Doran Clay D.O. Palliative Medicine  Team at [facility]  Pager: [phone] Team Phone: [phone]

## 2014-05-08 NOTE — Progress Notes (Signed)
Order received to titrate HFNC to off, and put patient on 2l [place] for comfort.  Weaned HFNC slowly from flow of 40 to 20 to 10 lpm.  Transitioned to regular nasal cannula on 4lpm.  Decreased to 2l per MD order.

## 2014-05-08 NOTE — Progress Notes (Signed)
[facility]  S: withdrawn, refusing meds or food O: Filed Vitals:   05/08/14 0302 05/08/14 0630 05/08/14 0720 05/08/14 0846  BP:  109/69    Pulse: 67 32 62 72  Temp:  97.6 F (36.4 C)    TempSrc:  Axillary    Resp: 17 16  16   Height:      Weight:  77 kg (169 lb 12.1 oz)    SpO2: 94% 97%  94%   Gen: lying in bed, nods head to questions HEENT: NCAT, EOMi,  PULM: Few crackles in bases CV: RRR, no mgr, no JVD AB: BS+, soft, nontender, no hsm Ext: warm, no edema, no clubbing, no cyanosis Neuro: arouses to voice, interactive  Impression:  1) Hypoxemic respiratory failure due to pulmonary fibrosis> cytoxan toxicity? Oxygenation stable if not improving  2) Goodpasture syndrome/microscopic polyangitis> currently not active  3) AKI > high BUN, improving Cr  4) Profound depression.  Plan: Length conversations with family, Ed, and palliative medicine.  Family and Ed wish to proceed with stopping oxygen and starting dilaudid gtt.  He understands he will die.  Start dilaudid gtt Prn ativan Wean off O2 to 2L [place] over 2 hours   Roselie Awkward, MD [facility] Pager: [phone] Cell: [phone] If no response, call [phone]

## 2014-05-08 NOTE — Progress Notes (Signed)
CSW (Clinical Education officer, museum) made aware that pt transferred to new unit. CSW provided handoff to new CSW. This CSW is signing off.  [place], [place]

## 2014-05-08 NOTE — Progress Notes (Signed)
UR COMPLETED  

## 2014-05-09 LAB — PARATHYROID HORMONE, INTACT (NO CA): PTH: 354.1 pg/mL — AB (ref 14.0–72.0)

## 2014-05-29 NOTE — Progress Notes (Addendum)
Called to room by family member at [phone]. Patient found breathless, pulseless,unresponsive.  Death pronounced by Alba Cory RN and Hosie Spangle RN.  Tylene Fantasia on call for [facility] notified.  She stated that the attending physician would come to the floor after 0700 to sign the death certificate.  [facility] notified.  Remaining Dilaudid drip wasted in sink;witnessed by Hosie Spangle RN.

## 2014-05-29 NOTE — Discharge Summary (Addendum)
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 with hypoxia Active Problems:   HYPERLIPIDEMIA   HYPERTENSION, BENIGN   History of DVT (deep vein thrombosis)   Glomerulonephritis   Anemia of chronic disease    Goodpasture's disease, treated with steroid, oral cytoxan and plasmapheresis     CKD (chronic kidney disease) stage 3, GFR 30-59 ml/min   Atrial flutter with rapid ventricular response   Orthostatic hypotension   Acute congestive heart failure   Acute respiratory failure   Protein-calorie malnutrition, severe   Palliative care encounter   History of present illness:  73 yo male h/o diastolic chf, aflutter, vte, goodpastures, ckd, oxygen dep on 2 Liters at home presented with several days of worsening sob and swelling in his legs along with getting very dizzy and weak every time he stands up. He had called his cardiologist a couple of days prior to admission and was instructed to decrease his metoprolol to 25mg  po bid which he did, however, his symptoms worsened.  In the ER, he was hypotensive.     Hospital Course:   Acute on chronic respiratory failure with hypoxia due to progressive Goodpasture's disease.  He was started on high flow nasal canula and seen by pulmonology who recommended transfer to [facility], however, the patient endorsed desire for comfort over cure.  Palliative care was consulted and assisted with transitioning from aggressive treatment of Goodpasture's to comfort measures.  His high flow nasal canula was transitioned to nasal canula 2L for comfort and he was started on dilaudid infusion which was titrated to prevent subjective shortness of breath.  He died of acute on chronic hypoxic respiratory failure secondary to goodpasture's on 06/05/14 at 0610.   Atrial flutter with rapid ventricular  response.  He was seen by cardiology.  He initially failure attempts of cardizem, BB, and Ibutilide infusion, but DCCV with bedside anesthesia was successful on 8/5.  ECHO demonstrated a decrease in systolic function from 23-55% to 40-45%.  Heparin gtt was discontinued once transition to comfort measures was made.    Glomerulonephritis with recurrent hematuria and acute on chronic kidney disease stage 3, GFR 30-59 ml/min, due to Goodpasture's disease.  Nephrology was consulted.  Initially his diuretics were held, however, his high dose Lasix was re-initiated 8/2.  This was discontinued at time of transition to comfort measures.    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  PCCM stopped cytoxan and began plasmapheresis but serologies were negative for Goodpastures etiology so plasmapheresis dcd 7/30.  High dose Solu-Medrol was tapered off and he was transitioned to comfort measures.  Acute systolic CHF likely secondary to atrial flutter and improved with lasix.    Oral candida, began Nystatin 8/4.  Transient melena occurred 7/30.  A/C was held, but since bleeding did not recur, A/C was resumed on 8/3.    Hypoxemic mediated anxiety was initially treated with klonopin but this was transitioned to Dilaudid drip and prn Ativan on 8/11.  Abnormal UA, cx positive only for 20,000 colonies of coag neg staph and asymptomatic so was felt to be colonization or contamination and was not treated.    Anemia of chronic disease/renal disease.   Hgb remained stable between 7.9 and 9   Thrombocytopenia due to melena and acute phase reactant from acute illness.  Resolved spontaneously.    HYPERLIPIDEMIA/HYPERTENSION, blood pressure controlled on no medications  History of DVT / PE / IVC filter, was transitioned from warfarin to eliquis during the last hospitalization at recommendation of the Pulmonologist in setting of inability to maintain  therapeutic INR on warfarin.  Eliquis dc'd in favor of Heparin gtt due to renal function and GIB in late July.  Further A/C as above.   Severe protein calorie malnutrition, continued liberalized diet and supplements.  Time:  Died at 0610 on 05-15-2014, 30 minutes to complete death certificate and note  Signed:  Zeya Balles  [facility] 2014-05-15, 9:28 AM

## 2014-05-29 DEATH — deceased

## 2015-11-18 IMAGING — US US BIOPSY
1 series · 13 of 13 positions shown · non-contrast
Comparison: US RENAL dated 12/20/2013;

INDICATION: Renal insufficiency, concern for glomerulonephritis

EXAM:
ULTRASOUND GUIDED RENAL BIOPSY

[Series 1: us biopsy · 0.21mm/px · 13 acquisitions, 13 frames shown]
[im 1/13]
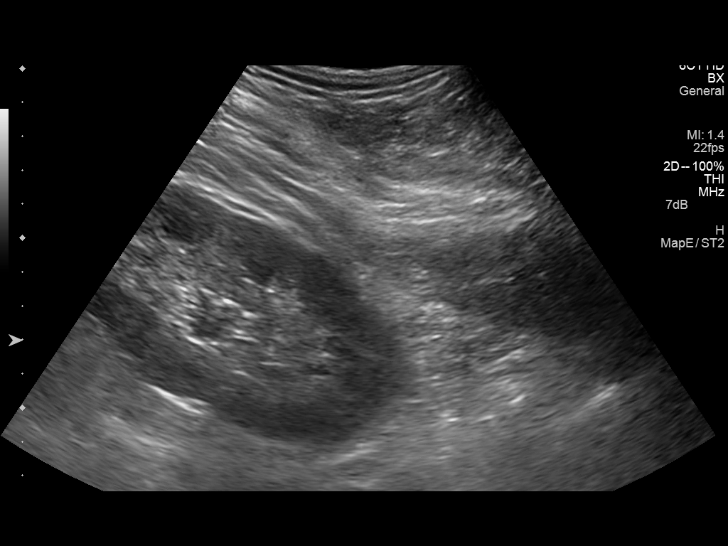
[im 2/13]
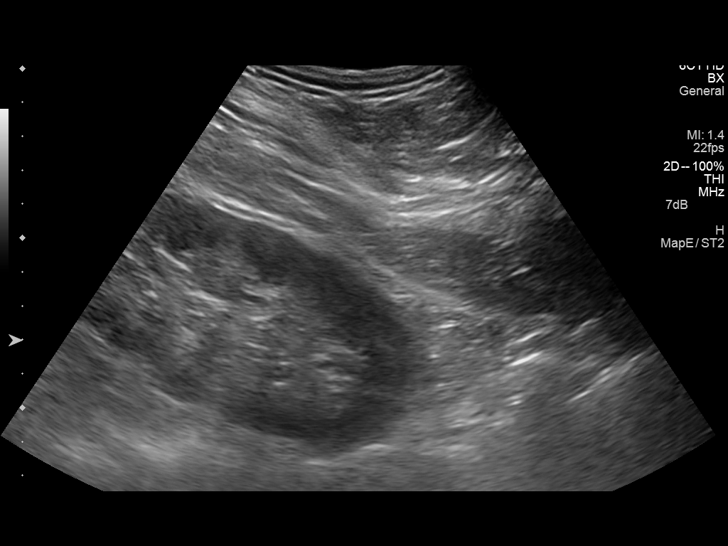
[im 3/13]
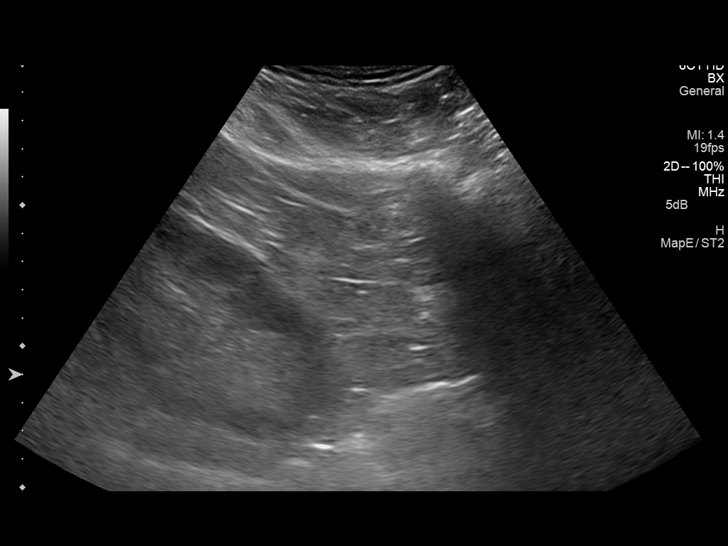
[im 4/13]
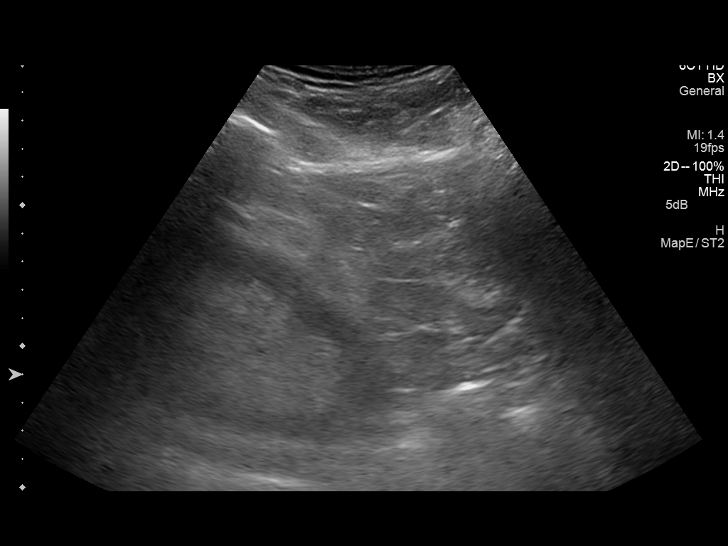
[im 5/13]
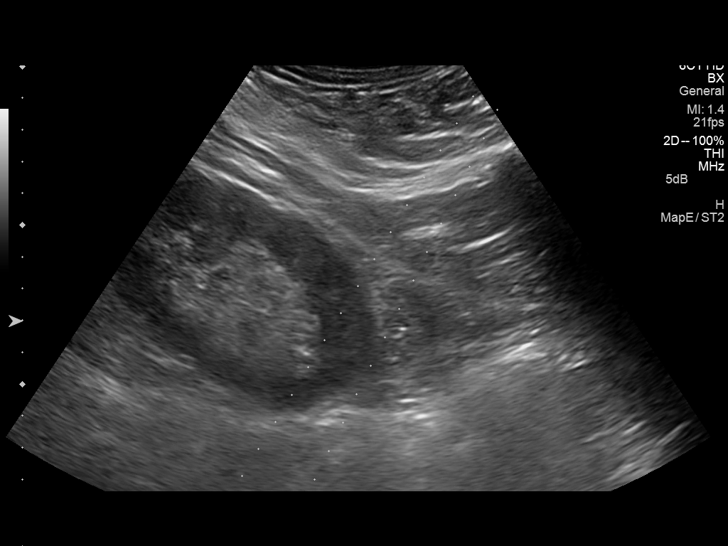
[im 6/13]
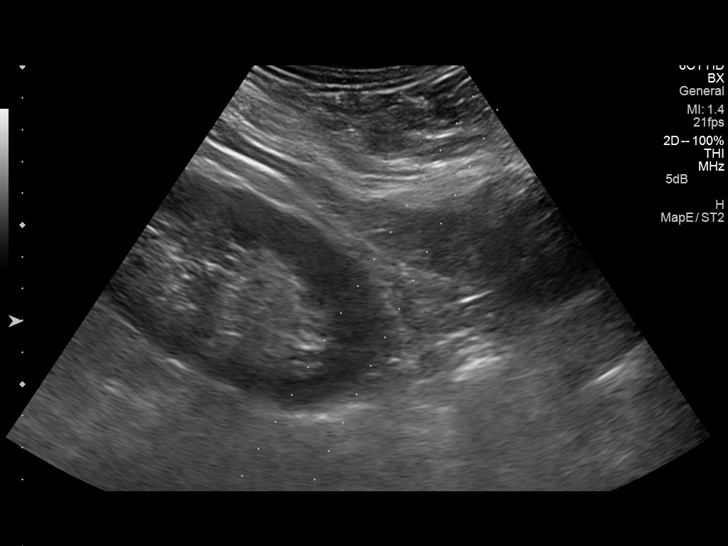
[im 7/13]
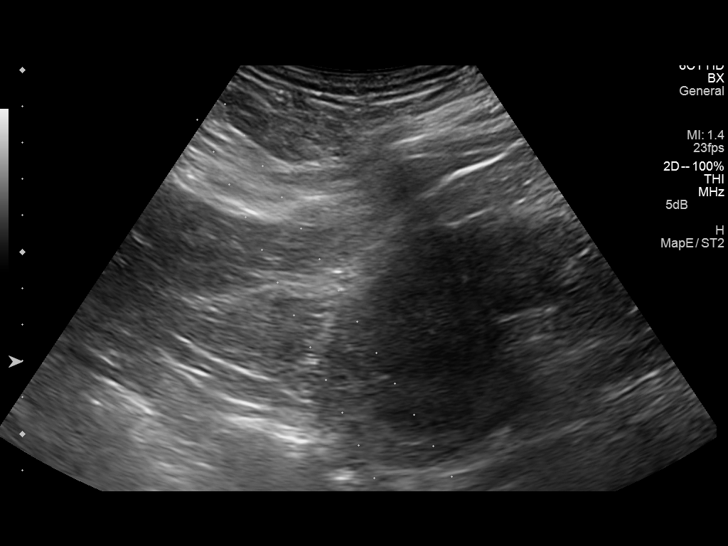
[im 8/13]
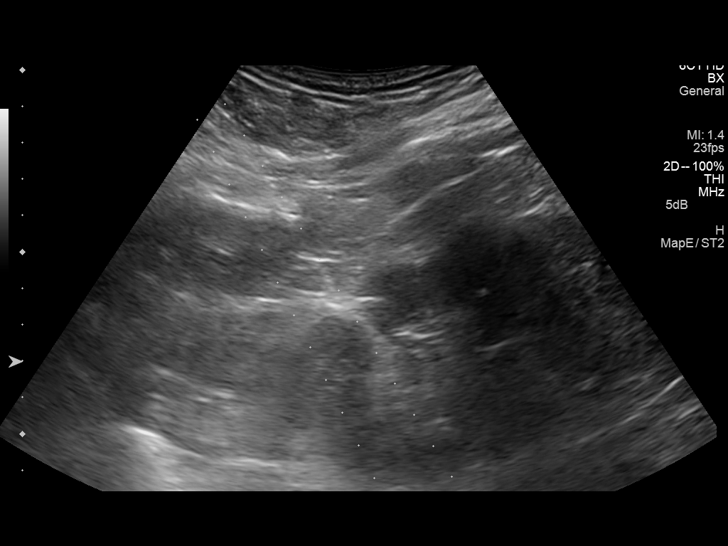
[im 9/13]
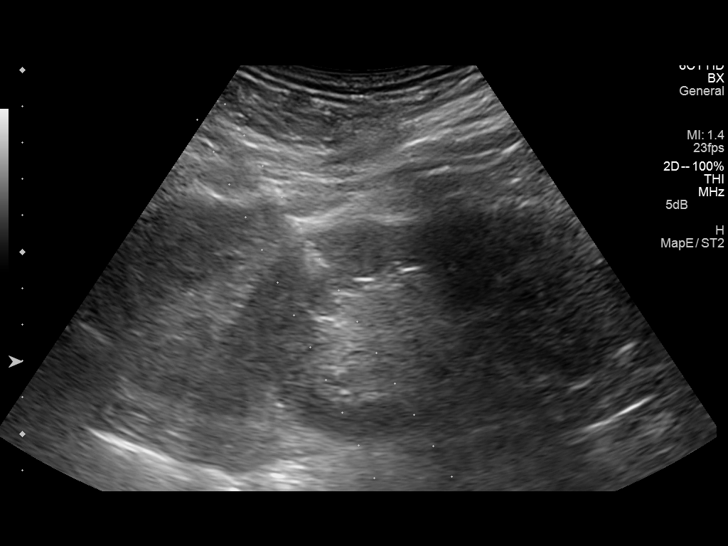
[im 10/13]
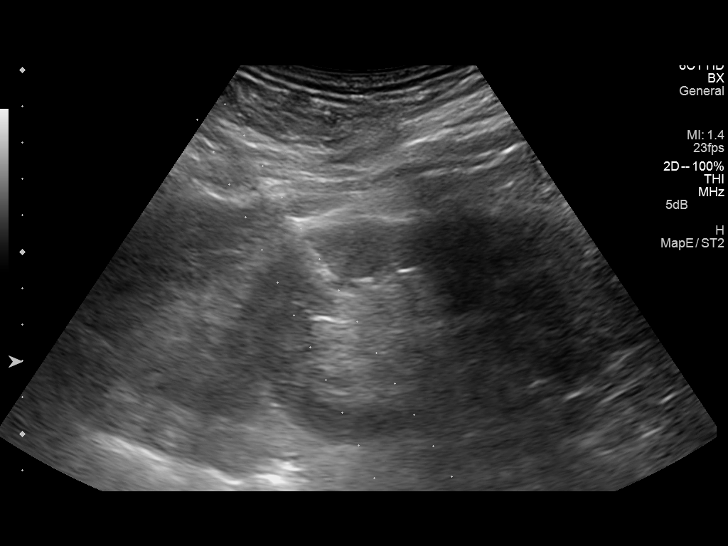
[im 11/13]
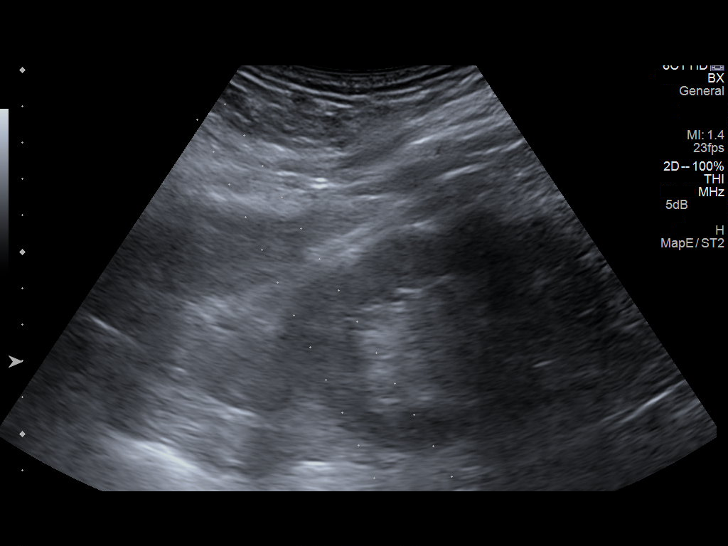
[im 12/13]
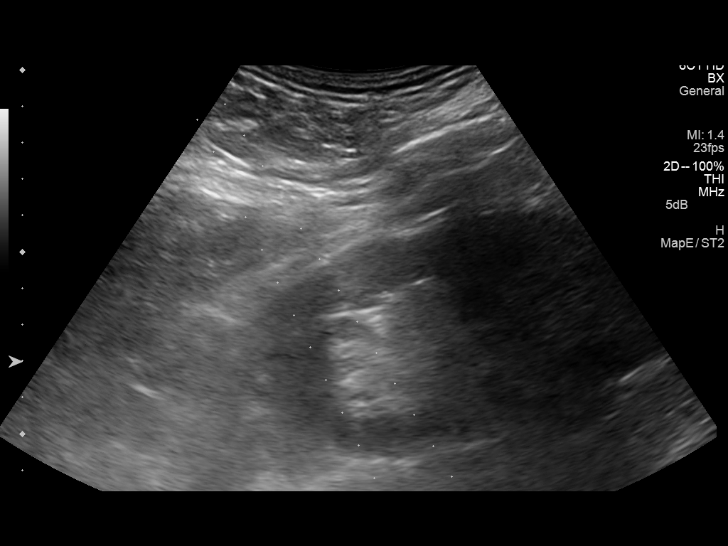
[im 13/13]
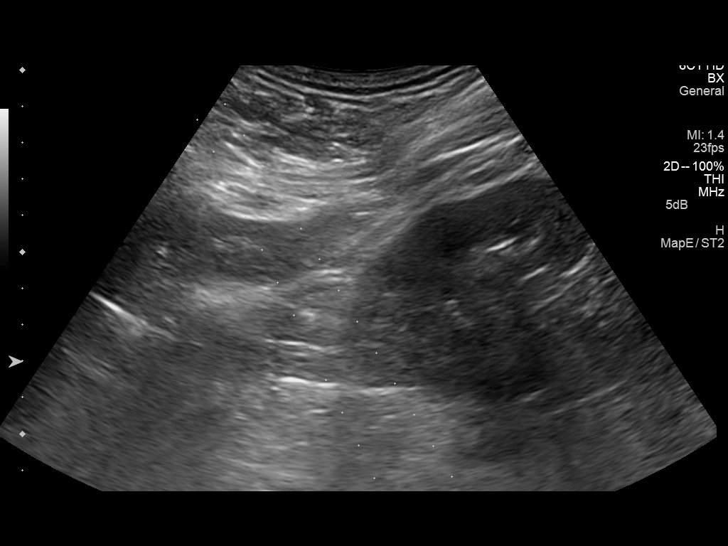

[13 of 13 positions shown; findings below may reference images not displayed]

CT ABD-PELV WO/W CM
(HEMATURIA) dated 08/14/2010

MEDICATIONS:
Fentanyl 100 mcg IV; Versed 2 mg IV

ANESTHESIA/SEDATION:
Total Moderate Sedation time

10 minutes

COMPLICATIONS:
None immediate

PROCEDURE:
Informed written consent was obtained from the patient after a
discussion of the risks, benefits and alternatives to treatment. The
patient understands and consents the procedure. A timeout was
performed prior to the initiation of the procedure.

Ultrasound scanning was performed of the bilateral flanks. The
inferior pole of the right kidney was selected for biopsy due to
location and sonographic window. The procedure was planned. The
operative site was prepped and draped in the usual sterile fashion.
The overlying soft tissues were anesthetized with 1% lidocaine with
epinephrine. A 17 gauge core needle biopsy device was advanced into
the inferior cortex of the right kidney and 2 core biopsies were
obtained under direct ultrasound guidance. Real time pathologic
review confirmed adequate tissue acquisition. Images were saved for
documentation purposes. The biopsy device was removed and hemostasis
was obtained with manual compression. Post procedural scanning was
negative for significant post procedural hemorrhage or additional
complication. A dressing was placed. The patient tolerated the
procedure well without immediate post procedural complication.
IMPRESSION: Technically successful ultrasound guided right renal biopsy.

## 2016-02-21 IMAGING — CR DG CHEST 2V
2 series · 2 of 2 positions shown · non-contrast
Comparison: Portable chest x-ray March 16, 2014

CLINICAL DATA: Several month history of shortness of breath with
history of CHF.

EXAM:
CHEST  2 VIEW

[PA]
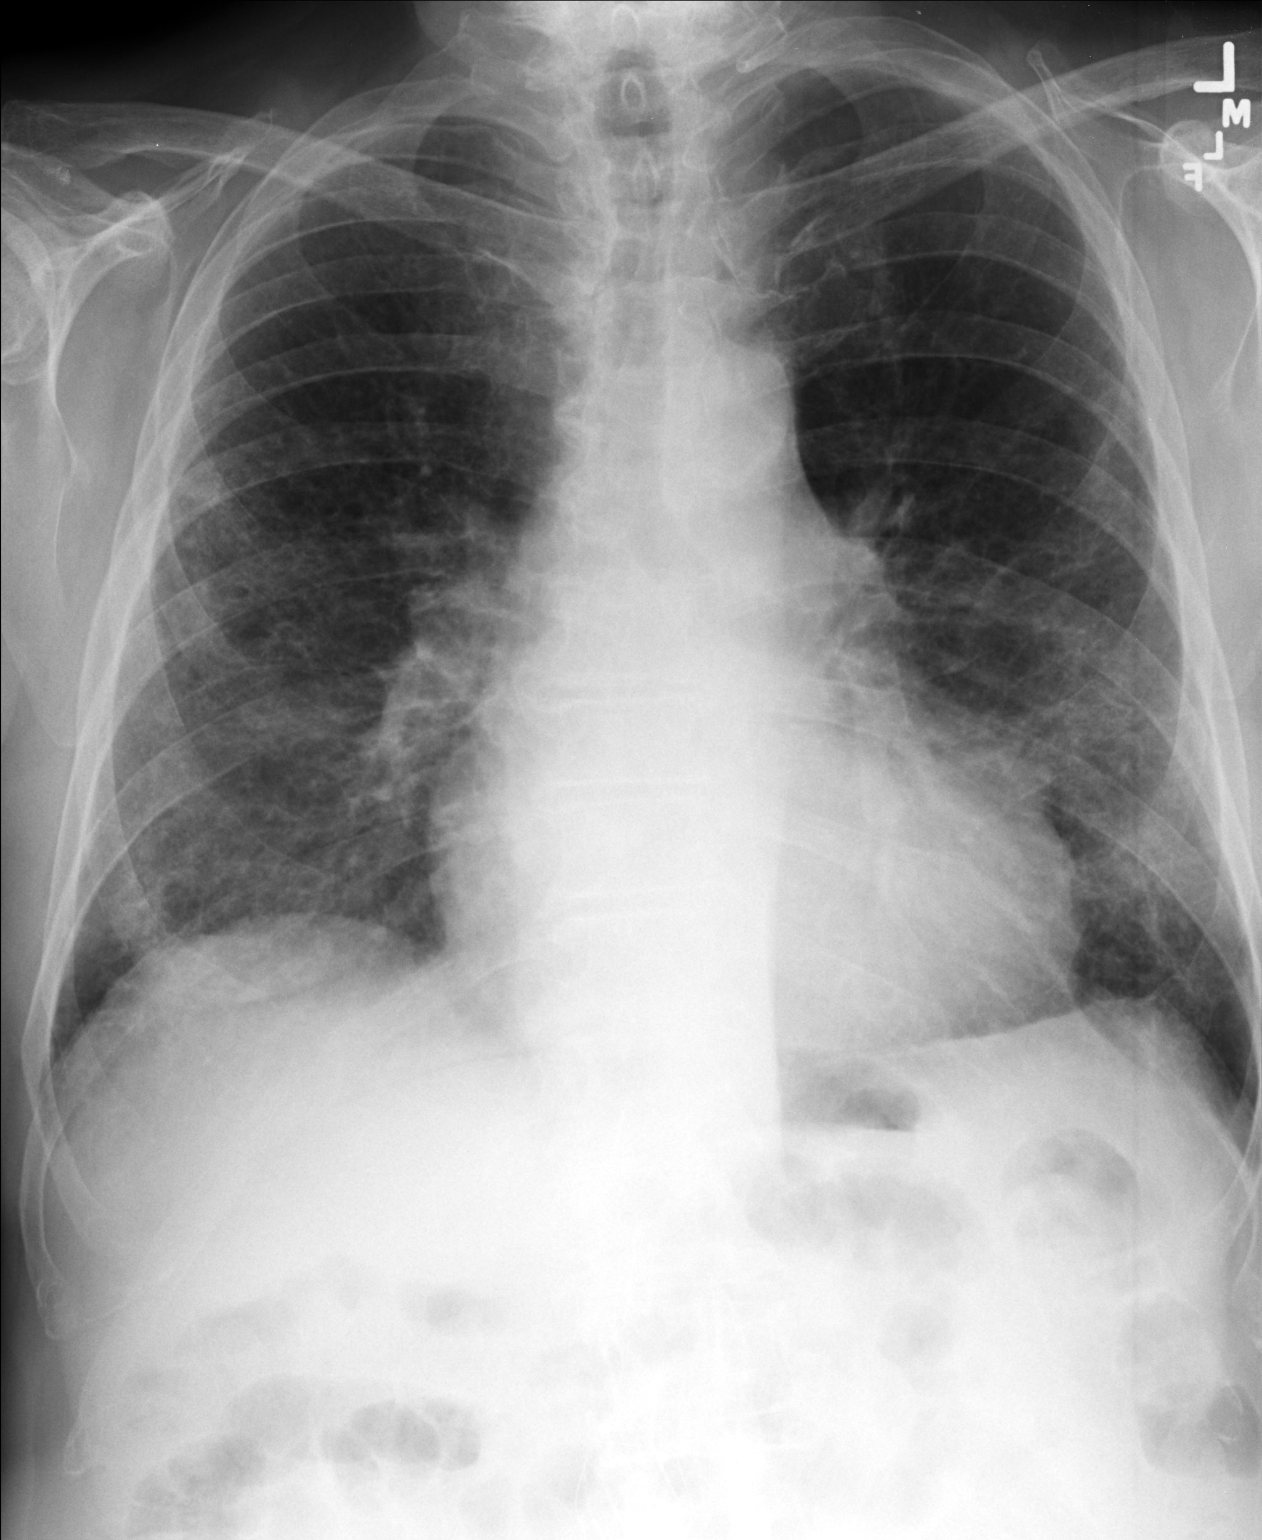

[lateral]
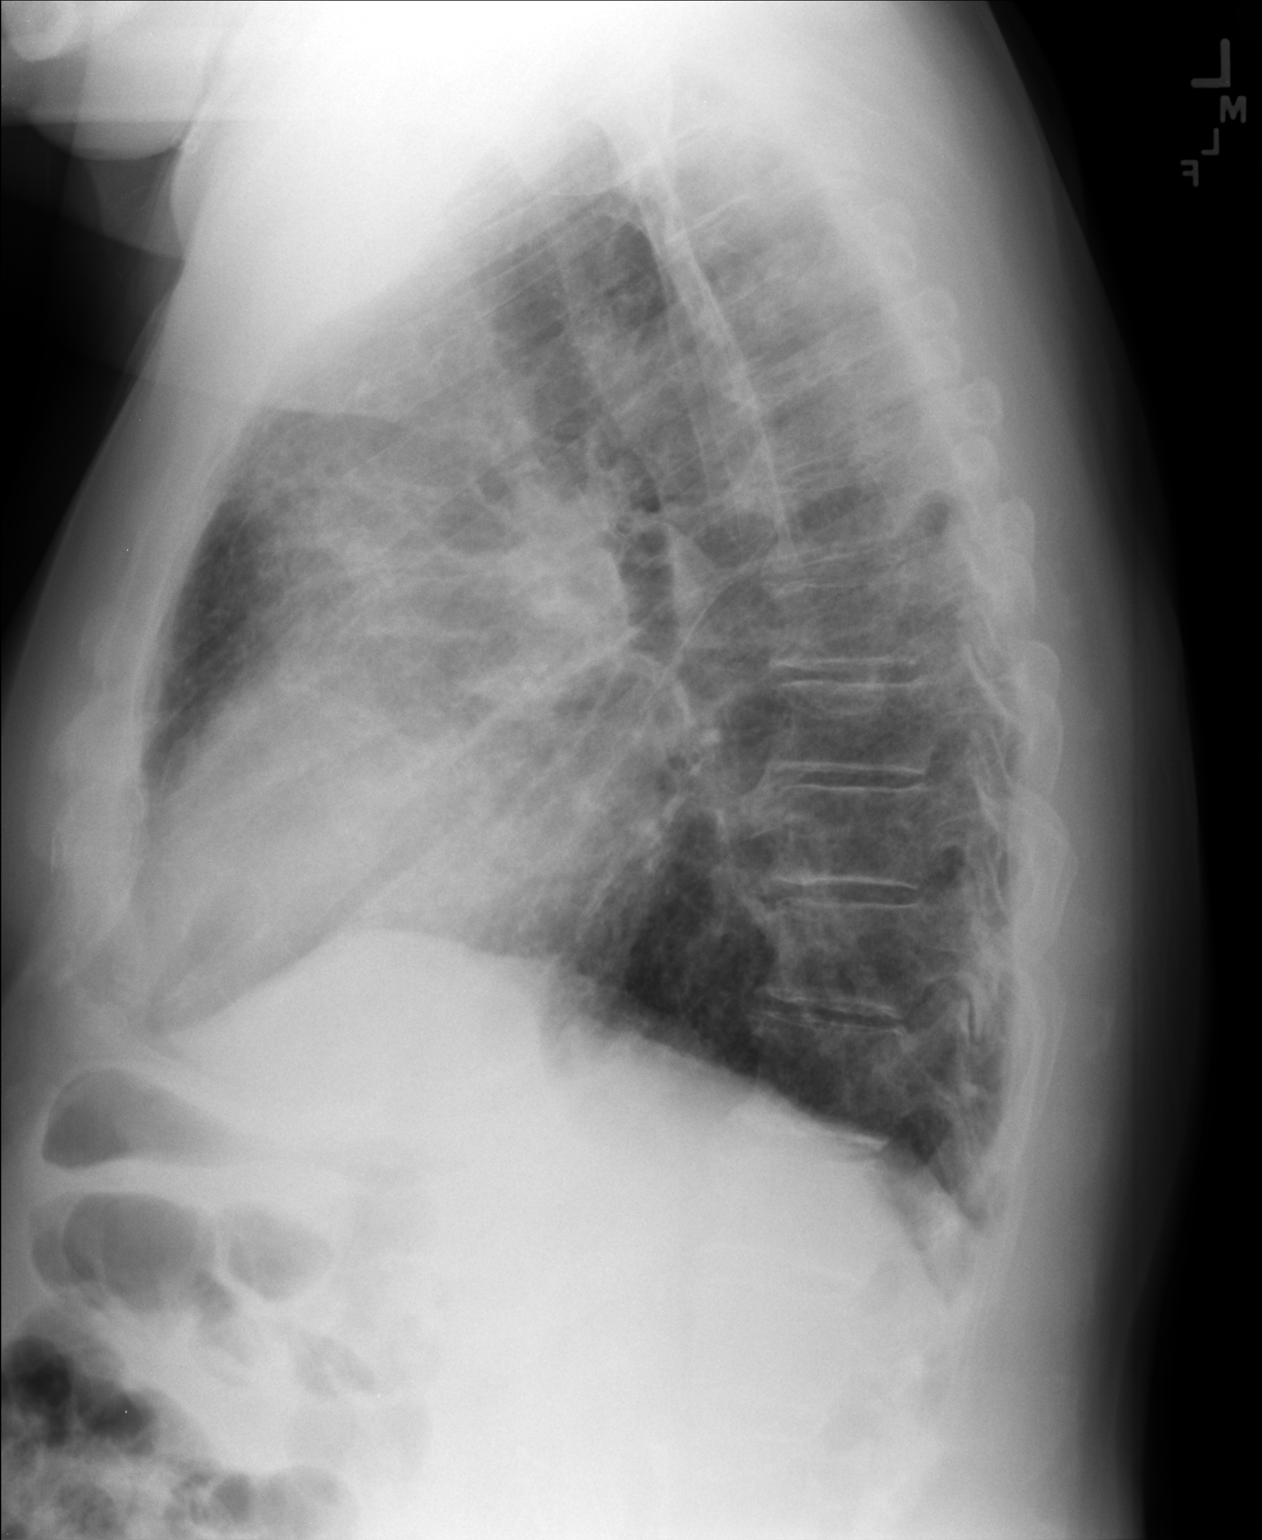

[2 of 2 positions shown; findings below may reference images not displayed]

FINDINGS: The lungs are well-expanded. The interstitial markings are mildly
prominent. The cardiac silhouette is top-normal in size. The
pulmonary vascularity is not clearly engorged. There is no pleural
effusion. The bony thorax is unremarkable.
IMPRESSION: Mildly increased interstitial markings may reflect interstitial
pneumonia but mild CHF may produce similar findings. There is no
classic alveolar pneumonia.
